# Patient Record
Sex: Female | Born: 1955
Health system: Southern US, Community
[De-identification: ages and names within clinical notes are randomized; demographics above are authoritative.]

## PROBLEM LIST (undated history)

## (undated) DIAGNOSIS — R600 Localized edema: Secondary | ICD-10-CM

## (undated) DIAGNOSIS — E7800 Pure hypercholesterolemia, unspecified: Secondary | ICD-10-CM

## (undated) DIAGNOSIS — E785 Hyperlipidemia, unspecified: Secondary | ICD-10-CM

## (undated) DIAGNOSIS — M1990 Unspecified osteoarthritis, unspecified site: Secondary | ICD-10-CM

## (undated) DIAGNOSIS — R7303 Prediabetes: Secondary | ICD-10-CM

## (undated) DIAGNOSIS — M5431 Sciatica, right side: Secondary | ICD-10-CM

## (undated) DIAGNOSIS — K5900 Constipation, unspecified: Secondary | ICD-10-CM

## (undated) DIAGNOSIS — G588 Other specified mononeuropathies: Secondary | ICD-10-CM

## (undated) DIAGNOSIS — G4733 Obstructive sleep apnea (adult) (pediatric): Secondary | ICD-10-CM

## (undated) DIAGNOSIS — J45909 Unspecified asthma, uncomplicated: Secondary | ICD-10-CM

## (undated) DIAGNOSIS — Z860101 Personal history of adenomatous and serrated colon polyps: Secondary | ICD-10-CM

## (undated) DIAGNOSIS — F32A Depression, unspecified: Secondary | ICD-10-CM

## (undated) DIAGNOSIS — I10 Essential (primary) hypertension: Secondary | ICD-10-CM

## (undated) DIAGNOSIS — N819 Female genital prolapse, unspecified: Secondary | ICD-10-CM

## (undated) DIAGNOSIS — N816 Rectocele: Secondary | ICD-10-CM

## (undated) DIAGNOSIS — F329 Major depressive disorder, single episode, unspecified: Secondary | ICD-10-CM

## (undated) DIAGNOSIS — K219 Gastro-esophageal reflux disease without esophagitis: Secondary | ICD-10-CM

## (undated) DIAGNOSIS — Z973 Presence of spectacles and contact lenses: Secondary | ICD-10-CM

## (undated) DIAGNOSIS — E559 Vitamin D deficiency, unspecified: Secondary | ICD-10-CM

## (undated) DIAGNOSIS — I4891 Unspecified atrial fibrillation: Secondary | ICD-10-CM

## (undated) DIAGNOSIS — R0609 Other forms of dyspnea: Secondary | ICD-10-CM

## (undated) DIAGNOSIS — R0602 Shortness of breath: Secondary | ICD-10-CM

## (undated) DIAGNOSIS — R002 Palpitations: Secondary | ICD-10-CM

## (undated) DIAGNOSIS — J4530 Mild persistent asthma, uncomplicated: Secondary | ICD-10-CM

## (undated) DIAGNOSIS — N8110 Cystocele, unspecified: Secondary | ICD-10-CM

## (undated) DIAGNOSIS — M549 Dorsalgia, unspecified: Secondary | ICD-10-CM

## (undated) DIAGNOSIS — I495 Sick sinus syndrome: Secondary | ICD-10-CM

## (undated) DIAGNOSIS — F319 Bipolar disorder, unspecified: Secondary | ICD-10-CM

## (undated) DIAGNOSIS — I4811 Longstanding persistent atrial fibrillation: Secondary | ICD-10-CM

## (undated) DIAGNOSIS — G4730 Sleep apnea, unspecified: Secondary | ICD-10-CM

## (undated) DIAGNOSIS — K5730 Diverticulosis of large intestine without perforation or abscess without bleeding: Secondary | ICD-10-CM

## (undated) DIAGNOSIS — Z86010 Personal history of colonic polyps: Secondary | ICD-10-CM

## (undated) DIAGNOSIS — E041 Nontoxic single thyroid nodule: Secondary | ICD-10-CM

## (undated) DIAGNOSIS — T884XXA Failed or difficult intubation, initial encounter: Secondary | ICD-10-CM

## (undated) DIAGNOSIS — Z7901 Long term (current) use of anticoagulants: Secondary | ICD-10-CM

## (undated) DIAGNOSIS — F3181 Bipolar II disorder: Secondary | ICD-10-CM

## (undated) DIAGNOSIS — M7989 Other specified soft tissue disorders: Secondary | ICD-10-CM

## (undated) DIAGNOSIS — R12 Heartburn: Secondary | ICD-10-CM

## (undated) DIAGNOSIS — I499 Cardiac arrhythmia, unspecified: Secondary | ICD-10-CM

## (undated) DIAGNOSIS — R112 Nausea with vomiting, unspecified: Secondary | ICD-10-CM

## (undated) DIAGNOSIS — Z8719 Personal history of other diseases of the digestive system: Secondary | ICD-10-CM

## (undated) DIAGNOSIS — Z9889 Other specified postprocedural states: Secondary | ICD-10-CM

## (undated) HISTORY — DX: Vitamin D deficiency, unspecified: E55.9

## (undated) HISTORY — DX: Unspecified atrial fibrillation: I48.91

## (undated) HISTORY — PX: COLONOSCOPY: SHX174

## (undated) HISTORY — PX: OTHER SURGICAL HISTORY: SHX169

## (undated) HISTORY — DX: Prediabetes: R73.03

## (undated) HISTORY — PX: TONSILLECTOMY: SUR1361

## (undated) HISTORY — DX: Dorsalgia, unspecified: M54.9

## (undated) HISTORY — DX: Depression, unspecified: F32.A

## (undated) HISTORY — DX: Heartburn: R12

## (undated) HISTORY — DX: Other specified mononeuropathies: G58.8

## (undated) HISTORY — DX: Other specified soft tissue disorders: M79.89

## (undated) HISTORY — DX: Palpitations: R00.2

## (undated) HISTORY — DX: Constipation, unspecified: K59.00

## (undated) HISTORY — DX: Shortness of breath: R06.02

## (undated) HISTORY — DX: Longstanding persistent atrial fibrillation: I48.11

## (undated) HISTORY — DX: Pure hypercholesterolemia, unspecified: E78.00

---
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
## 1977-06-13 HISTORY — PX: TONSILLECTOMY: SUR1361

## 2003-09-23 ENCOUNTER — Ambulatory Visit (HOSPITAL_COMMUNITY): Admission: RE | Admit: 2003-09-23 | Discharge: 2003-09-23 | Payer: Self-pay | Admitting: Family Medicine

## 2003-09-25 ENCOUNTER — Other Ambulatory Visit: Admission: RE | Admit: 2003-09-25 | Discharge: 2003-09-25 | Payer: Self-pay | Admitting: *Deleted

## 2003-10-15 ENCOUNTER — Ambulatory Visit (HOSPITAL_COMMUNITY): Admission: RE | Admit: 2003-10-15 | Discharge: 2003-10-15 | Payer: Self-pay | Admitting: Gastroenterology

## 2004-12-07 ENCOUNTER — Other Ambulatory Visit: Admission: RE | Admit: 2004-12-07 | Discharge: 2004-12-07 | Payer: Self-pay | Admitting: *Deleted

## 2004-12-21 ENCOUNTER — Ambulatory Visit (HOSPITAL_COMMUNITY): Admission: RE | Admit: 2004-12-21 | Discharge: 2004-12-21 | Payer: Self-pay | Admitting: Family Medicine

## 2005-07-08 ENCOUNTER — Encounter: Admission: RE | Admit: 2005-07-08 | Discharge: 2005-10-06 | Payer: Self-pay | Admitting: Nurse Practitioner

## 2005-11-15 ENCOUNTER — Ambulatory Visit (HOSPITAL_BASED_OUTPATIENT_CLINIC_OR_DEPARTMENT_OTHER): Admission: RE | Admit: 2005-11-15 | Discharge: 2005-11-15 | Payer: Self-pay | Admitting: Orthopaedic Surgery

## 2005-11-15 HISTORY — PX: SHOULDER ARTHROSCOPY: SHX128

## 2006-01-16 ENCOUNTER — Ambulatory Visit (HOSPITAL_COMMUNITY): Admission: RE | Admit: 2006-01-16 | Discharge: 2006-01-16 | Payer: Self-pay | Admitting: Family Medicine

## 2006-08-09 ENCOUNTER — Other Ambulatory Visit: Admission: RE | Admit: 2006-08-09 | Discharge: 2006-08-09 | Payer: Self-pay | Admitting: *Deleted

## 2006-08-25 ENCOUNTER — Ambulatory Visit: Payer: Self-pay | Admitting: Oncology

## 2006-08-31 LAB — COMPREHENSIVE METABOLIC PANEL
ALT: 20 U/L (ref 0–35)
AST: 16 U/L (ref 0–37)
Albumin: 4.1 g/dL (ref 3.5–5.2)
Alkaline Phosphatase: 58 U/L (ref 39–117)
BUN: 15 mg/dL (ref 6–23)
CO2: 24 mEq/L (ref 19–32)
Calcium: 9.3 mg/dL (ref 8.4–10.5)
Chloride: 102 mEq/L (ref 96–112)
Creatinine, Ser: 0.62 mg/dL (ref 0.40–1.20)
Glucose, Bld: 89 mg/dL (ref 70–99)
Potassium: 3.9 mEq/L (ref 3.5–5.3)
Sodium: 139 mEq/L (ref 135–145)
Total Bilirubin: 0.4 mg/dL (ref 0.3–1.2)
Total Protein: 6.9 g/dL (ref 6.0–8.3)

## 2006-08-31 LAB — LACTATE DEHYDROGENASE: LDH: 116 U/L (ref 94–250)

## 2006-08-31 LAB — CBC WITH DIFFERENTIAL/PLATELET
BASO%: 0.3 % (ref 0.0–2.0)
Basophils Absolute: 0 10*3/uL (ref 0.0–0.1)
EOS%: 3.5 % (ref 0.0–7.0)
Eosinophils Absolute: 0.5 10*3/uL (ref 0.0–0.5)
HCT: 38.8 % (ref 34.8–46.6)
HGB: 13.6 g/dL (ref 11.6–15.9)
LYMPH%: 17.7 % (ref 14.0–48.0)
MCH: 29.7 pg (ref 26.0–34.0)
MCHC: 35 g/dL (ref 32.0–36.0)
MCV: 85 fL (ref 81.0–101.0)
MONO#: 0.9 10*3/uL (ref 0.1–0.9)
MONO%: 6.5 % (ref 0.0–13.0)
NEUT#: 10.2 10*3/uL — ABNORMAL HIGH (ref 1.5–6.5)
NEUT%: 72 % (ref 39.6–76.8)
Platelets: 361 10*3/uL (ref 145–400)
RBC: 4.56 10*6/uL (ref 3.70–5.32)
RDW: 13.7 % (ref 11.3–14.5)
WBC: 14.1 10*3/uL — ABNORMAL HIGH (ref 3.9–10.0)
lymph#: 2.5 10*3/uL (ref 0.9–3.3)

## 2006-08-31 LAB — CHCC SMEAR

## 2007-02-05 ENCOUNTER — Ambulatory Visit (HOSPITAL_COMMUNITY): Admission: RE | Admit: 2007-02-05 | Discharge: 2007-02-05 | Payer: Self-pay | Admitting: Family Medicine

## 2007-02-09 ENCOUNTER — Ambulatory Visit: Payer: Self-pay | Admitting: Oncology

## 2007-02-15 LAB — CBC WITH DIFFERENTIAL/PLATELET
BASO%: 0.3 % (ref 0.0–2.0)
Basophils Absolute: 0 10*3/uL (ref 0.0–0.1)
EOS%: 3.4 % (ref 0.0–7.0)
Eosinophils Absolute: 0.5 10*3/uL (ref 0.0–0.5)
HCT: 40 % (ref 34.8–46.6)
HGB: 14 g/dL (ref 11.6–15.9)
LYMPH%: 21.2 % (ref 14.0–48.0)
MCH: 30.6 pg (ref 26.0–34.0)
MCHC: 35.1 g/dL (ref 32.0–36.0)
MCV: 87.2 fL (ref 81.0–101.0)
MONO#: 0.9 10*3/uL (ref 0.1–0.9)
MONO%: 6.5 % (ref 0.0–13.0)
NEUT#: 9 10*3/uL — ABNORMAL HIGH (ref 1.5–6.5)
NEUT%: 68.6 % (ref 39.6–76.8)
Platelets: 342 10*3/uL (ref 145–400)
RBC: 4.58 10*6/uL (ref 3.70–5.32)
RDW: 12.9 % (ref 11.3–14.5)
WBC: 13.2 10*3/uL — ABNORMAL HIGH (ref 3.9–10.0)
lymph#: 2.8 10*3/uL (ref 0.9–3.3)

## 2007-02-20 LAB — COMPREHENSIVE METABOLIC PANEL
ALT: 13 U/L (ref 0–35)
AST: 12 U/L (ref 0–37)
Albumin: 4.4 g/dL (ref 3.5–5.2)
Alkaline Phosphatase: 59 U/L (ref 39–117)
BUN: 16 mg/dL (ref 6–23)
CO2: 25 mEq/L (ref 19–32)
Calcium: 9.5 mg/dL (ref 8.4–10.5)
Chloride: 102 mEq/L (ref 96–112)
Creatinine, Ser: 0.72 mg/dL (ref 0.40–1.20)
Glucose, Bld: 123 mg/dL — ABNORMAL HIGH (ref 70–99)
Potassium: 4 mEq/L (ref 3.5–5.3)
Sodium: 138 mEq/L (ref 135–145)
Total Bilirubin: 0.4 mg/dL (ref 0.3–1.2)
Total Protein: 7.4 g/dL (ref 6.0–8.3)

## 2007-02-20 LAB — JAK2 GENOTYPR

## 2007-08-14 ENCOUNTER — Ambulatory Visit: Payer: Self-pay | Admitting: Oncology

## 2008-02-19 ENCOUNTER — Ambulatory Visit (HOSPITAL_COMMUNITY): Admission: RE | Admit: 2008-02-19 | Discharge: 2008-02-19 | Payer: Self-pay | Admitting: Family Medicine

## 2008-04-08 ENCOUNTER — Other Ambulatory Visit: Admission: RE | Admit: 2008-04-08 | Discharge: 2008-04-08 | Payer: Self-pay | Admitting: Family Medicine

## 2008-04-14 ENCOUNTER — Encounter: Admission: RE | Admit: 2008-04-14 | Discharge: 2008-04-14 | Payer: Self-pay | Admitting: Family Medicine

## 2008-06-13 HISTORY — PX: SHOULDER SURGERY: SHX246

## 2008-10-14 ENCOUNTER — Encounter: Admission: RE | Admit: 2008-10-14 | Discharge: 2008-10-14 | Payer: Self-pay | Admitting: Family Medicine

## 2009-06-13 HISTORY — PX: TOE SURGERY: SHX1073

## 2010-07-02 ENCOUNTER — Ambulatory Visit (HOSPITAL_COMMUNITY)
Admission: RE | Admit: 2010-07-02 | Discharge: 2010-07-02 | Payer: Self-pay | Source: Home / Self Care | Attending: Family Medicine | Admitting: Family Medicine

## 2010-10-29 NOTE — Op Note (Signed)
Mary Mcfarland, Mary Mcfarland         ACCOUNT NO.:  [id number]   MEDICAL RECORD NO.:  [id number]          PATIENT TYPE:  AMB   LOCATION:  DSC                          FACILITY:  [facility]   PHYSICIAN:  Lubertha Basque. Dalldorf, M.D.DATE OF BIRTH:  [birth year]   DATE OF PROCEDURE:  11/15/2005  DATE OF DISCHARGE:                                 OPERATIVE REPORT   PREOPERATIVE DIAGNOSIS:  1.  Right shoulder impingement.  2.  Right shoulder partial rotator cuff tear.   POSTOPERATIVE DIAGNOSIS:  1.  Right shoulder impingement.  2.  Right shoulder partial rotator cuff tear.   PROCEDURE:  1.  Right shoulder arthroscopic acromioplasty.  2.  Right shoulder arthroscopic debridement.   ANESTHESIA:  General and block.   ATTENDING SURGEON:  Lubertha Basque. Jerl Santos, M.D.   ASSISTANT:  Lindwood Qua, P.A.-C.   INDICATIONS FOR PROCEDURE:  The patient is a 55 year old woman with a long  history of right shoulder pain.  This has persisted despite oral anti-  inflammatories and an exercise program.  She underwent a subacromial  injection which did help for a short period of time.  She has pain which  limits her ability to rest and use her arm.  She is offered an arthroscopy.  Informed operative consent was obtained after a discussion about the  possible complications of reaction to anesthesia and infection.  We did not  obtain a preoperative MRI scan as that was going to be a bit of a financial  hardship to her.  It also did not seem imperative do so.   SUMMARY:  Under general anesthesia and a block, through two portals, an  arthroscopy of the right shoulder was performed.  She did have things  consistent with an impingement syndrome and a partial thickness bursal side  rotator cuff tear.  A debridement was done along with a thorough  acromioplasty.  There were no degenerative changes and the labral structures  and the biceps tendon appeared normal throughout.  Bryna Colander assisted  throughout with  positioning and passing of instruments and direction of the  arthroscope and closure of portals.   DESCRIPTION OF PROCEDURE:  The patient was taken to the operating suite  where general anesthetic was applied without difficulty.  She was positioned  in a beach-chair position and prepped and draped in a normal sterile  fashion.  After the administration of IV antibiotic which was Kefzol, an  arthroscopy of the right shoulder was performed through a total of two  portals.  The glenohumeral joint showed no degenerative changes and the  biceps tendon and rotator cuff appeared benign from below.  In the  subacromial space, she had things consistent with a bursal side partial  thickness rotator cuff tear.  This was debrided back to stable tissues and  consisted of no more than 10% thickness of the rotator cuff.  She had a  prominent subacromial morphology addressed with an acromioplasty back to a  flat surface.  This was done with the bur in the lateral position followed  by transfer of the bur to the posterior position.  The [facility] joint did not  appear  to be impinging on the rotator cuff and was not addressed.  The  shoulder was thoroughly irrigated followed by reapproximation of the portals  loosely with nylon.  Adaptic was applied followed by dry gauze and tape.  Estimated blood loss and interoperative fluids can be obtained from  anesthesia records.   DISPOSITION:  The patient was extubated in the operating room and taken to  the recovery room in stable addition.  The plans were for her to go home on  the same day and follow up in the office in a week.  I will contact her by  phone tonight.      Lubertha Basque Jerl Santos, M.D.  Electronically Signed     PGD/MEDQ  D:  11/15/2005  T:  11/15/2005  Job:  [id number]

## 2011-12-07 ENCOUNTER — Other Ambulatory Visit (HOSPITAL_BASED_OUTPATIENT_CLINIC_OR_DEPARTMENT_OTHER): Payer: Self-pay | Admitting: Family Medicine

## 2011-12-07 DIAGNOSIS — E041 Nontoxic single thyroid nodule: Secondary | ICD-10-CM

## 2011-12-09 ENCOUNTER — Ambulatory Visit (HOSPITAL_BASED_OUTPATIENT_CLINIC_OR_DEPARTMENT_OTHER)
Admission: RE | Admit: 2011-12-09 | Discharge: 2011-12-09 | Disposition: A | Discharge status: Home / Self Care | Payer: BC Managed Care – PPO | Source: Ambulatory Visit | Attending: Family Medicine | Admitting: Family Medicine

## 2011-12-09 ENCOUNTER — Other Ambulatory Visit (HOSPITAL_BASED_OUTPATIENT_CLINIC_OR_DEPARTMENT_OTHER): Payer: Self-pay

## 2011-12-09 DIAGNOSIS — E041 Nontoxic single thyroid nodule: Secondary | ICD-10-CM | POA: Insufficient documentation

## 2012-06-08 ENCOUNTER — Other Ambulatory Visit (HOSPITAL_COMMUNITY): Payer: Self-pay | Admitting: Family

## 2012-06-08 DIAGNOSIS — Z1231 Encounter for screening mammogram for malignant neoplasm of breast: Secondary | ICD-10-CM

## 2012-08-10 ENCOUNTER — Other Ambulatory Visit (HOSPITAL_COMMUNITY): Payer: Self-pay | Admitting: Family

## 2012-08-10 ENCOUNTER — Other Ambulatory Visit (HOSPITAL_COMMUNITY): Payer: Self-pay | Admitting: Family Medicine

## 2012-08-10 DIAGNOSIS — Z1231 Encounter for screening mammogram for malignant neoplasm of breast: Secondary | ICD-10-CM

## 2012-08-21 ENCOUNTER — Ambulatory Visit (HOSPITAL_COMMUNITY)
Admission: RE | Admit: 2012-08-21 | Discharge: 2012-08-21 | Disposition: A | Discharge status: Home / Self Care | Payer: BC Managed Care – PPO | Source: Ambulatory Visit | Attending: Family Medicine | Admitting: Family Medicine

## 2012-08-21 DIAGNOSIS — Z1231 Encounter for screening mammogram for malignant neoplasm of breast: Secondary | ICD-10-CM

## 2013-08-06 ENCOUNTER — Other Ambulatory Visit: Payer: Self-pay | Admitting: Orthopaedic Surgery

## 2013-08-06 DIAGNOSIS — M25519 Pain in unspecified shoulder: Secondary | ICD-10-CM

## 2013-08-17 ENCOUNTER — Ambulatory Visit
Admission: RE | Admit: 2013-08-17 | Discharge: 2013-08-17 | Disposition: A | Discharge status: Home / Self Care | Payer: BC Managed Care – PPO | Source: Ambulatory Visit | Attending: Orthopaedic Surgery | Admitting: Orthopaedic Surgery

## 2013-08-17 DIAGNOSIS — M25519 Pain in unspecified shoulder: Secondary | ICD-10-CM

## 2014-03-26 ENCOUNTER — Emergency Department (HOSPITAL_COMMUNITY)
Admission: EM | Admit: 2014-03-26 | Discharge: 2014-03-26 | Disposition: A | Discharge status: Home / Self Care | Payer: PRIVATE HEALTH INSURANCE | Attending: Emergency Medicine | Admitting: Emergency Medicine

## 2014-03-26 ENCOUNTER — Encounter (HOSPITAL_COMMUNITY): Payer: Self-pay | Admitting: Emergency Medicine

## 2014-03-26 DIAGNOSIS — N3091 Cystitis, unspecified with hematuria: Secondary | ICD-10-CM

## 2014-03-26 DIAGNOSIS — R300 Dysuria: Secondary | ICD-10-CM | POA: Diagnosis not present

## 2014-03-26 DIAGNOSIS — N3090 Cystitis, unspecified without hematuria: Secondary | ICD-10-CM | POA: Insufficient documentation

## 2014-03-26 DIAGNOSIS — R319 Hematuria, unspecified: Secondary | ICD-10-CM | POA: Insufficient documentation

## 2014-03-26 DIAGNOSIS — Z79899 Other long term (current) drug therapy: Secondary | ICD-10-CM | POA: Diagnosis not present

## 2014-03-26 DIAGNOSIS — Z87891 Personal history of nicotine dependence: Secondary | ICD-10-CM | POA: Insufficient documentation

## 2014-03-26 DIAGNOSIS — I10 Essential (primary) hypertension: Secondary | ICD-10-CM | POA: Insufficient documentation

## 2014-03-26 DIAGNOSIS — R339 Retention of urine, unspecified: Secondary | ICD-10-CM | POA: Diagnosis present

## 2014-03-26 HISTORY — DX: Essential (primary) hypertension: I10

## 2014-03-26 LAB — URINALYSIS, ROUTINE W REFLEX MICROSCOPIC
Glucose, UA: 250 mg/dL — AB
Ketones, ur: 40 mg/dL — AB
Nitrite: POSITIVE — AB
Protein, ur: >300 mg/dL — AB
Specific Gravity, Urine: 1.03 (ref 1.005–1.030)
Urobilinogen, UA: 4 mg/dL — ABNORMAL HIGH (ref 0.0–1.0)
pH: 5 (ref 5.0–8.0)

## 2014-03-26 LAB — BASIC METABOLIC PANEL
Anion gap: 15 (ref 5–15)
BUN: 21 mg/dL (ref 6–23)
CO2: 26 mEq/L (ref 19–32)
Calcium: 9.8 mg/dL (ref 8.4–10.5)
Chloride: 97 mEq/L (ref 96–112)
Creatinine, Ser: 0.71 mg/dL (ref 0.50–1.10)
GFR calc Af Amer: >90 mL/min (ref >90)
GFR calc non Af Amer: >90 mL/min (ref >90)
Glucose, Bld: 103 mg/dL — ABNORMAL HIGH (ref 70–99)
Potassium: 3.3 mEq/L — ABNORMAL LOW (ref 3.7–5.3)
Sodium: 138 mEq/L (ref 137–147)

## 2014-03-26 LAB — CBC WITH DIFFERENTIAL/PLATELET
Basophils Absolute: 0 10*3/uL (ref 0.0–0.1)
Basophils Relative: 0 % (ref 0–1)
Eosinophils Absolute: 0.2 10*3/uL (ref 0.0–0.7)
Eosinophils Relative: 1 % (ref 0–5)
HCT: 41.6 % (ref 36.0–46.0)
Hemoglobin: 13.8 g/dL (ref 12.0–15.0)
Lymphocytes Relative: 10 % — ABNORMAL LOW (ref 12–46)
Lymphs Abs: 2 10*3/uL (ref 0.7–4.0)
MCH: 29 pg (ref 26.0–34.0)
MCHC: 33.2 g/dL (ref 30.0–36.0)
MCV: 87.4 fL (ref 78.0–100.0)
Monocytes Absolute: 1.3 10*3/uL — ABNORMAL HIGH (ref 0.1–1.0)
Monocytes Relative: 7 % (ref 3–12)
Neutro Abs: 16.7 10*3/uL — ABNORMAL HIGH (ref 1.7–7.7)
Neutrophils Relative %: 82 % — ABNORMAL HIGH (ref 43–77)
Platelets: 314 10*3/uL (ref 150–400)
RBC: 4.76 MIL/uL (ref 3.87–5.11)
RDW: 13 % (ref 11.5–15.5)
WBC: 20.2 10*3/uL — ABNORMAL HIGH (ref 4.0–10.5)

## 2014-03-26 LAB — URINE MICROSCOPIC-ADD ON

## 2014-03-26 MED ORDER — PHENAZOPYRIDINE HCL 200 MG PO TABS: 200.0000 mg | ORAL_TABLET | Freq: Three times a day (TID) | ORAL | Status: DC | PRN

## 2014-03-26 MED ORDER — HYDROCODONE-ACETAMINOPHEN 5-325 MG PO TABS
1.0000 | ORAL_TABLET | ORAL | Status: AC
  Administered 2014-03-26: 1 via ORAL
  Filled 2014-03-26: qty 1

## 2014-03-26 MED ORDER — PHENAZOPYRIDINE HCL 200 MG PO TABS
200.0000 mg | ORAL_TABLET | Freq: Three times a day (TID) | ORAL | Status: DC
  Administered 2014-03-26: 200 mg via ORAL
  Filled 2014-03-26: qty 1

## 2014-03-26 MED ORDER — HYDROCODONE-ACETAMINOPHEN 5-325 MG PO TABS: 1.0000 | ORAL_TABLET | ORAL | Status: DC | PRN

## 2014-03-26 MED ORDER — CIPROFLOXACIN HCL 500 MG PO TABS: 500.0000 mg | ORAL_TABLET | Freq: Two times a day (BID) | ORAL | Status: DC

## 2014-03-26 MED ORDER — CIPROFLOXACIN HCL 500 MG PO TABS
500.0000 mg | ORAL_TABLET | Freq: Once | ORAL | Status: AC
  Administered 2014-03-26: 500 mg via ORAL
  Filled 2014-03-26: qty 1

## 2014-03-26 NOTE — ED Provider Notes (Signed)
CSN: [id number]     Arrival date & time 03/26/14  1902 History   First MD Initiated Contact with Patient 03/26/14 1915     Chief Complaint  Patient presents with  . Hematuria  . Urinary Retention   Patient is a 58 y.o. female presenting with hematuria. The history is provided by the patient.  Hematuria This is a new problem. The current episode started yesterday. The problem occurs constantly.  Pt noticed an urge to urinate yesterday.  Initially the symptoms were mild but then it increased.  She is constantly having to urinate small amounts that look bloody.  No back pain.  No fevers although she has felt chilled.   NO vomiting.  Some loose stools.    Past Medical History  Diagnosis Date  . Hypertension    Past Surgical History  Procedure Laterality Date  . Tonsillectomy    . Bone spur surgery     No family history on file. History  Substance Use Topics  . Smoking status: Former Smoker -- 1.00 packs/day    Types: Cigarettes  . Smokeless tobacco: Not on file  . Alcohol Use: Yes     Comment: rarely   OB History   Grav Para Term Preterm Abortions TAB SAB Ect Mult Living                 Review of Systems  Constitutional: Negative for fever.  Genitourinary: Positive for dysuria and hematuria. Negative for flank pain.  All other systems reviewed and are negative.     Allergies  Niaspan  Home Medications   Prior to Admission medications   Medication Sig Start Date End Date Taking? Authorizing Provider  atorvastatin (LIPITOR) 40 MG tablet Take 40 mg by mouth daily.   Yes Historical Provider, MD  buPROPion (WELLBUTRIN XL) 300 MG 24 hr tablet Take 300 mg by mouth daily.   Yes Historical Provider, MD  divalproex (DEPAKOTE ER) 500 MG 24 hr tablet Take 1,000 mg by mouth at bedtime.   Yes Historical Provider, MD  lisinopril (PRINIVIL,ZESTRIL) 20 MG tablet Take 20 mg by mouth daily.   Yes Historical Provider, MD  ciprofloxacin (CIPRO) 500 MG tablet Take 1 tablet (500 mg total)  by mouth 2 (two) times daily. 03/26/14   Dorie Rank, MD  HYDROcodone-acetaminophen (NORCO/VICODIN) 5-325 MG per tablet Take 1-2 tablets by mouth every 4 (four) hours as needed. 03/26/14   Dorie Rank, MD  phenazopyridine (PYRIDIUM) 200 MG tablet Take 1 tablet (200 mg total) by mouth 3 (three) times daily as needed for pain. 03/26/14   Dorie Rank, MD   BP 131/64  Pulse 65  Temp(Src) 97.9 F (36.6 C) (Oral)  Resp 16  Ht 5\' 6"  (1.676 m)  Wt 248 lb (112.492 kg)  BMI 40.05 kg/m2  SpO2 98%  LMP 09/24/2013 Physical Exam  Nursing note and vitals reviewed. Constitutional: She appears well-developed and well-nourished. No distress.  HENT:  Head: Normocephalic and atraumatic.  Right Ear: External ear normal.  Left Ear: External ear normal.  Eyes: Conjunctivae are normal. Right eye exhibits no discharge. Left eye exhibits no discharge. No scleral icterus.  Neck: Neck supple. No tracheal deviation present.  Cardiovascular: Normal rate, regular rhythm and intact distal pulses.   Pulmonary/Chest: Effort normal and breath sounds normal. No stridor. No respiratory distress. She has no wheezes. She has no rales.  Abdominal: Soft. Bowel sounds are normal. She exhibits no distension. There is no tenderness. There is no rebound and no guarding.  Musculoskeletal: She exhibits no edema and no tenderness.  Neurological: She is alert. She has normal strength. No cranial nerve deficit (no facial droop, extraocular movements intact, no slurred speech) or sensory deficit. She exhibits normal muscle tone. She displays no seizure activity. Coordination normal.  Skin: Skin is warm and dry. No rash noted.  Psychiatric: She has a normal mood and affect.    ED Course  Procedures (including critical care time) Labs Review Labs Reviewed  CBC WITH DIFFERENTIAL - Abnormal; Notable for the following:    WBC 20.2 (*)    Neutrophils Relative % 82 (*)    Neutro Abs 16.7 (*)    Lymphocytes Relative 10 (*)    Monocytes  Absolute 1.3 (*)    All other components within normal limits  BASIC METABOLIC PANEL - Abnormal; Notable for the following:    Potassium 3.3 (*)    Glucose, Bld 103 (*)    All other components within normal limits  URINALYSIS, ROUTINE W REFLEX MICROSCOPIC - Abnormal; Notable for the following:    Color, Urine RED (*)    APPearance TURBID (*)    Glucose, UA 250 (*)    Hgb urine dipstick LARGE (*)    Bilirubin Urine LARGE (*)    Ketones, ur 40 (*)    Protein, ur >300 (*)    Urobilinogen, UA 4.0 (*)    Nitrite POSITIVE (*)    Leukocytes, UA LARGE (*)    All other components within normal limits  URINE MICROSCOPIC-ADD ON - Abnormal; Notable for the following:    Bacteria, UA FEW (*)    Crystals CA OXALATE CRYSTALS (*)    All other components within normal limits  URINE CULTURE     MDM   Final diagnoses:  Hemorrhagic cystitis    Patient's symptoms are consistent with a hemorrhagic cystitis. She had a bladder scan while she was in the emergency room. There is no evidence of urinary retention. I think her symptoms are related to her urgency and frequency.  Patient is tolerating oral fluids. She is in no distress. Plan is to discharge home with oral antibiotics and followup with her primary care doctor.   Dorie Rank, MD 03/26/14 2147

## 2014-03-26 NOTE — Discharge Instructions (Signed)

## 2014-03-26 NOTE — ED Notes (Signed)
Pt has a urine specimen at the bedside.

## 2014-03-26 NOTE — ED Notes (Addendum)
MD Knapp at bedside 

## 2014-03-26 NOTE — ED Notes (Signed)
Pt reports that she started having hematuria and dysuria a couple of days ago. Gross hematuria noted in sample provided.

## 2014-03-28 LAB — URINE CULTURE: Colony Count: >=100000

## 2014-03-31 ENCOUNTER — Telehealth (HOSPITAL_BASED_OUTPATIENT_CLINIC_OR_DEPARTMENT_OTHER): Payer: Self-pay

## 2014-03-31 NOTE — Telephone Encounter (Signed)
Post ED Visit - Positive Culture Follow-up  Culture report reviewed by antimicrobial stewardship pharmacist: []  [address], Pharm.D., BCPS [x]  Heide Guile, Pharm.D., BCPS []  Alycia Rossetti, Pharm.D., BCPS []  [place], [place].D., BCPS, AAHIVP []  Legrand Como, Pharm.D., BCPS, AAHIVP []  Carly Sabat, Pharm.D. []  Elenor Quinones, Pharm.D.  Positive Urine culture Treated with Ciprofloxacin, organism sensitive to the same and no further patient follow-up is required at this time.  [name] 03/31/2014, 4:07 AM

## 2015-06-14 DIAGNOSIS — I4811 Longstanding persistent atrial fibrillation: Secondary | ICD-10-CM

## 2015-06-14 HISTORY — PX: FOOT SURGERY: SHX648

## 2015-06-14 HISTORY — DX: Longstanding persistent atrial fibrillation: I48.11

## 2015-06-18 ENCOUNTER — Other Ambulatory Visit: Payer: Self-pay | Admitting: Cardiology

## 2015-06-18 ENCOUNTER — Ambulatory Visit
Admission: RE | Admit: 2015-06-18 | Discharge: 2015-06-18 | Disposition: A | Discharge status: Home / Self Care | Payer: BLUE CROSS/BLUE SHIELD | Source: Ambulatory Visit | Attending: Cardiology | Admitting: Cardiology

## 2015-06-18 DIAGNOSIS — I480 Paroxysmal atrial fibrillation: Secondary | ICD-10-CM

## 2015-09-23 ENCOUNTER — Ambulatory Visit (HOSPITAL_BASED_OUTPATIENT_CLINIC_OR_DEPARTMENT_OTHER): Payer: BLUE CROSS/BLUE SHIELD

## 2016-03-02 ENCOUNTER — Ambulatory Visit (HOSPITAL_BASED_OUTPATIENT_CLINIC_OR_DEPARTMENT_OTHER): Payer: BLUE CROSS/BLUE SHIELD | Attending: Cardiology | Admitting: Internal Medicine

## 2016-03-02 VITALS — Ht 66.0 in | Wt 250.0 lb

## 2016-03-02 DIAGNOSIS — R0683 Snoring: Secondary | ICD-10-CM | POA: Insufficient documentation

## 2016-03-02 DIAGNOSIS — I491 Atrial premature depolarization: Secondary | ICD-10-CM | POA: Diagnosis not present

## 2016-03-02 DIAGNOSIS — G4733 Obstructive sleep apnea (adult) (pediatric): Secondary | ICD-10-CM | POA: Diagnosis not present

## 2016-03-02 DIAGNOSIS — G4700 Insomnia, unspecified: Secondary | ICD-10-CM | POA: Diagnosis not present

## 2016-03-02 DIAGNOSIS — G4736 Sleep related hypoventilation in conditions classified elsewhere: Secondary | ICD-10-CM | POA: Diagnosis not present

## 2016-03-02 DIAGNOSIS — I480 Paroxysmal atrial fibrillation: Secondary | ICD-10-CM

## 2016-03-03 ENCOUNTER — Other Ambulatory Visit (HOSPITAL_BASED_OUTPATIENT_CLINIC_OR_DEPARTMENT_OTHER): Payer: Self-pay

## 2016-03-03 DIAGNOSIS — I480 Paroxysmal atrial fibrillation: Secondary | ICD-10-CM

## 2016-03-05 DIAGNOSIS — I480 Paroxysmal atrial fibrillation: Secondary | ICD-10-CM | POA: Diagnosis not present

## 2016-03-05 NOTE — Procedures (Signed)
  Patient Name: Mary Mcfarland, Mary Mcfarland Date: 03/02/2016 Gender: Female D.O.B: Apr 03, 1956 Age (years): 60 Referring Provider: Landry Corporal Height (inches): 51 Interpreting Physician: Baird Lyons MD, ABSM Weight (lbs): 250 RPSGT: Gerhard Perches BMI: 40 MRN: [id number] Neck Size: 16.25 CLINICAL INFORMATION Sleep Study Type: NPSG Indication for sleep study: Snoring Epworth Sleepiness Score: 2  SLEEP STUDY TECHNIQUE As per the AASM Manual for the Scoring of Sleep and Associated Events v2.3 (April 2016) with a hypopnea requiring 4% desaturations. The channels recorded and monitored were frontal, central and occipital EEG, electrooculogram (EOG), submentalis EMG (chin), nasal and oral airflow, thoracic and abdominal wall motion, anterior tibialis EMG, snore microphone, electrocardiogram, and pulse oximetry.  MEDICATIONS Patient's medications include: charted for review. Medications self-administered by patient during sleep study : No sleep medicine administered.  SLEEP ARCHITECTURE The study was initiated at 11:00:51 PM and ended at 5:30:31 AM. Sleep onset time was 29.0 minutes and the sleep efficiency was 69.6%. The total sleep time was 271.2 minutes. Stage REM latency was 216.0 minutes. The patient spent 8.19% of the night in stage N1 sleep, 72.09% in stage N2 sleep, 0.00% in stage N3 and 19.73% in REM. Alpha intrusion was absent. Supine sleep was 30.83%. Wake after sleep onset 89 minutes  RESPIRATORY PARAMETERS The overall apnea/hypopnea index (AHI) was 20.4 per hour. There were 44 total apneas, including 39 obstructive, 5 central and 0 mixed apneas. There were 48 hypopneas and 40 RERAs. The AHI during Stage REM sleep was 59.4 per hour. AHI while supine was 34.4 per hour. The mean oxygen saturation was 93.37%. The minimum SpO2 during sleep was 62.00%. Moderate snoring was noted during this study.  CARDIAC DATA The 2 lead EKG demonstrated sinus rhythm. The mean  heart rate was 39.06 beats per minute. Other EKG findings include: Frequent PACs.  LEG MOVEMENT DATA The total PLMS were 0 with a resulting PLMS index of 0.00. Associated arousal with leg movement index was 0.0 .  IMPRESSIONS - Moderate obstructive sleep apnea occurred during this study (AHI = 20.4/h). - No significant central sleep apnea occurred during this study (CAI = 1.1/h). - Difficulty initiating and maintaining sleep, with insufficient sleep in first hours to meet protocol requirements for split CPAP titration. - Severe oxygen desaturation was noted during this study (Min O2 = 62.00%, Mean saturation 93%). - The patient snored with Moderate snoring volume. - Frequent PACs. - Clinically significant periodic limb movements did not occur during sleep. No significant associated arousals.  DIAGNOSIS - Obstructive Sleep Apnea (327.23 [G47.33 ICD-10]) - Nocturnal Hypoxemia (327.26 [G47.36 ICD-10]) - Difficulty initiating and maintaining sleep- insomnia  RECOMMENDATIONS - Therapeutic CPAP titration to determine optimal pressure required to alleviate sleep disordered breathing. - Positional therapy avoiding supine position during sleep. - Avoid alcohol, sedatives and other CNS depressants that may worsen sleep apnea and disrupt normal sleep architecture. - Sleep hygiene should be reviewed to assess factors that may improve sleep quality. - Weight management and regular exercise should be initiated or continued if appropriate.  [Electronically signed] 03/05/2016 03:31 PM  Baird Lyons MD, [place], American Board of Sleep Medicine   NPI: [id number]  [place], American Board of Sleep Medicine  ELECTRONICALLY SIGNED ON:  03/05/2016, 3:24 PM [place] PH: [phone]   FX: [phone] [place]

## 2016-03-13 DIAGNOSIS — T884XXA Failed or difficult intubation, initial encounter: Secondary | ICD-10-CM

## 2016-03-13 HISTORY — DX: Failed or difficult intubation, initial encounter: T88.4XXA

## 2016-03-14 ENCOUNTER — Other Ambulatory Visit: Payer: Self-pay | Admitting: Orthopaedic Surgery

## 2016-03-14 ENCOUNTER — Encounter (HOSPITAL_COMMUNITY): Payer: Self-pay | Admitting: *Deleted

## 2016-03-14 NOTE — H&P (Signed)
Mary Mcfarland is an 59 y.o. female.   Chief Complaint: left heel pain HPI: Mary Mcfarland is here with a new problem. I haven't seen her in a few years. She is here about her left heel. She says for 4 or 5 months she's had terrible heel pain despite shoe wear modification and some pads. At this point she can only wear sandals. She has terrible pain on the posterior aspect of this heel.   Radiographs:  X-rays that were ordered, performed, and interpreted by me today included 2 views of the left heel. These show a prominent pump bump along with some Achilles insertional spurs.  Past Medical History:  Diagnosis Date  . Asthma   . Bipolar disorder ([place])   . Difficult intubation    Narrow airway  . Dysrhythmia    A-fib  . Hypertension   . Sleep apnea     Past Surgical History:  Procedure Laterality Date  . bone spur surgery    . COLONOSCOPY    . TONSILLECTOMY      Family History  Problem Relation Age of Onset  . Heart disease Mother   . CVA Mother   . Hypertension Mother   . Cancer Mother   . Heart disease Father   . Hypertension Father   . Heart attack Father    Social History:  reports that she quit smoking about 20 years ago. Her smoking use included Cigarettes. She smoked 1.00 pack per day. She has never used smokeless tobacco. She reports that she drinks alcohol. Her drug history is not on file.  Allergies:  Allergies  Allergen Reactions  . Niacin Hives    No prescriptions prior to admission.    No results found for this or any previous visit (from the past 48 hour(s)). No results found.  Review of Systems  Musculoskeletal: Positive for joint pain.       Left heel  All other systems reviewed and are negative.   Last menstrual period 09/24/2013. Physical Exam  Constitutional: She is oriented to person, place, and time. She appears well-developed and well-nourished.  HENT:  Head: Normocephalic and atraumatic.  Eyes: Pupils are equal, round, and reactive to  light.  Neck: Normal range of motion.  Cardiovascular: Normal rate and regular rhythm.   Respiratory: Effort normal.  GI: Soft.  Musculoskeletal:  Left heel has a prominence at the Achilles attachment. This is exquisitely tender to palpation. There is some mild redness. Her ankle motion is full. Sensation and motor function are intact distally with palpable pulses on both feet. She also has some retrocalcaneal pain to palpation at this left ankle. Proximally Achilles tendon is nontender at the musculotendinous junction.   Neurological: She is alert and oriented to person, place, and time.  Skin: Skin is warm and dry.  Psychiatric: She has a normal mood and affect. Her behavior is normal. Judgment and thought content normal.     Assessment/Plan Assessment: Left ankle pump bump and Achilles insertional pain  Plan: I think we can help Mary Mcfarland with a heel procedure. She has disabling pain and she cannot wear regular shoes. I think if we can perform the pump bump excision along with excision of the insertional Achilles spurs and reattachment of the tendon she will eventually do well. I reviewed risk of anesthesia, infection, and wound healing problems. I told her she'll have to be nonweightbearing on this side for about a month. It sounds as though this is something she might want to set up  in the near future. I'm not really sure why she is on Eliquis as she's seen a cardiologist and worn a Holter monitor and it never registered any arrhythmia. Nevertheless she's been told that she can come off the medicine for procedures and we'll have her do that for about 5 or 6 days prior to the operation.  Palmer Fahrner, Larwance Sachs, PA-C 03/14/2016, 6:17 PM

## 2016-03-14 NOTE — Progress Notes (Signed)
Pt has history of A-fib, sees Dr. Wynonia Lawman, last OV was in February. Denies chest pain or sob. Just recently had sleep study done and she does have sleep apnea, but has not started Cpap. Pt states she was to have this surgery at [place] on 03/10/16 but was unable to be intubated due to a "narrow airway". States it's the first time she has been told this. Have requested Anesthesia notes from [place].

## 2016-03-15 ENCOUNTER — Ambulatory Visit (HOSPITAL_COMMUNITY): Payer: BLUE CROSS/BLUE SHIELD | Admitting: Anesthesiology

## 2016-03-15 ENCOUNTER — Observation Stay (HOSPITAL_COMMUNITY)
Admission: RE | Admit: 2016-03-15 | Discharge: 2016-03-16 | Disposition: A | Discharge status: Home / Self Care | Payer: BLUE CROSS/BLUE SHIELD | Source: Ambulatory Visit | Attending: Orthopaedic Surgery | Admitting: Orthopaedic Surgery

## 2016-03-15 ENCOUNTER — Encounter (HOSPITAL_COMMUNITY)
Admission: RE | Disposition: A | Discharge status: Home / Self Care | Payer: Self-pay | Source: Ambulatory Visit | Attending: Orthopaedic Surgery

## 2016-03-15 ENCOUNTER — Encounter (HOSPITAL_COMMUNITY): Payer: Self-pay | Admitting: *Deleted

## 2016-03-15 DIAGNOSIS — I10 Essential (primary) hypertension: Secondary | ICD-10-CM | POA: Diagnosis not present

## 2016-03-15 DIAGNOSIS — Z7901 Long term (current) use of anticoagulants: Secondary | ICD-10-CM | POA: Diagnosis not present

## 2016-03-15 DIAGNOSIS — M7752 Other enthesopathy of left foot: Principal | ICD-10-CM | POA: Insufficient documentation

## 2016-03-15 DIAGNOSIS — I4891 Unspecified atrial fibrillation: Secondary | ICD-10-CM | POA: Diagnosis not present

## 2016-03-15 DIAGNOSIS — Z87891 Personal history of nicotine dependence: Secondary | ICD-10-CM | POA: Insufficient documentation

## 2016-03-15 DIAGNOSIS — F319 Bipolar disorder, unspecified: Secondary | ICD-10-CM | POA: Diagnosis not present

## 2016-03-15 DIAGNOSIS — Z6841 Body Mass Index (BMI) 40.0 and over, adult: Secondary | ICD-10-CM | POA: Diagnosis not present

## 2016-03-15 DIAGNOSIS — J45909 Unspecified asthma, uncomplicated: Secondary | ICD-10-CM | POA: Diagnosis not present

## 2016-03-15 DIAGNOSIS — M7731 Calcaneal spur, right foot: Secondary | ICD-10-CM | POA: Diagnosis present

## 2016-03-15 DIAGNOSIS — M9261 Juvenile osteochondrosis of tarsus, right ankle: Secondary | ICD-10-CM | POA: Diagnosis present

## 2016-03-15 DIAGNOSIS — G4730 Sleep apnea, unspecified: Secondary | ICD-10-CM | POA: Insufficient documentation

## 2016-03-15 HISTORY — DX: Failed or difficult intubation, initial encounter: T88.4XXA

## 2016-03-15 HISTORY — DX: Sleep apnea, unspecified: G47.30

## 2016-03-15 HISTORY — PX: HEEL SPUR RESECTION: SHX6410

## 2016-03-15 HISTORY — DX: Bipolar disorder, unspecified: F31.9

## 2016-03-15 HISTORY — DX: Unspecified asthma, uncomplicated: J45.909

## 2016-03-15 HISTORY — DX: Cardiac arrhythmia, unspecified: I49.9

## 2016-03-15 LAB — BASIC METABOLIC PANEL
Anion gap: 9 (ref 5–15)
BUN: 17 mg/dL (ref 6–20)
CO2: 28 mmol/L (ref 22–32)
Calcium: 9.2 mg/dL (ref 8.9–10.3)
Chloride: 104 mmol/L (ref 101–111)
Creatinine, Ser: 0.62 mg/dL (ref 0.44–1.00)
GFR calc Af Amer: >60 mL/min (ref >60)
GFR calc non Af Amer: >60 mL/min (ref >60)
Glucose, Bld: 103 mg/dL — ABNORMAL HIGH (ref 65–99)
Potassium: 4 mmol/L (ref 3.5–5.1)
Sodium: 141 mmol/L (ref 135–145)

## 2016-03-15 LAB — PROTIME-INR
INR: 0.97
Prothrombin Time: 12.9 seconds (ref 11.4–15.2)

## 2016-03-15 LAB — CBC
HCT: 41.2 % (ref 36.0–46.0)
Hemoglobin: 13.1 g/dL (ref 12.0–15.0)
MCH: 28.7 pg (ref 26.0–34.0)
MCHC: 31.8 g/dL (ref 30.0–36.0)
MCV: 90.2 fL (ref 78.0–100.0)
Platelets: 271 10*3/uL (ref 150–400)
RBC: 4.57 MIL/uL (ref 3.87–5.11)
RDW: 13.1 % (ref 11.5–15.5)
WBC: 8.1 10*3/uL (ref 4.0–10.5)

## 2016-03-15 SURGERY — EXCISION, BONE SPUR, CALCANEUS
Anesthesia: Regional | Laterality: Left

## 2016-03-15 MED ORDER — LACTATED RINGERS IV SOLN: INTRAVENOUS | Status: DC

## 2016-03-15 MED ORDER — MIDAZOLAM HCL 2 MG/2ML IJ SOLN
INTRAMUSCULAR | Status: AC
  Filled 2016-03-15: qty 2

## 2016-03-15 MED ORDER — ONDANSETRON HCL 4 MG PO TABS: 4.0000 mg | ORAL_TABLET | Freq: Four times a day (QID) | ORAL | Status: DC | PRN

## 2016-03-15 MED ORDER — HYDROMORPHONE HCL 1 MG/ML IJ SOLN: 0.2500 mg | INTRAMUSCULAR | Status: DC | PRN

## 2016-03-15 MED ORDER — METHOCARBAMOL 1000 MG/10ML IJ SOLN
500.0000 mg | Freq: Four times a day (QID) | INTRAVENOUS | Status: DC | PRN
  Administered 2016-03-16: 500 mg via INTRAVENOUS
  Filled 2016-03-15 (×2): qty 5

## 2016-03-15 MED ORDER — BUPROPION HCL ER (XL) 150 MG PO TB24
300.0000 mg | ORAL_TABLET | Freq: Every day | ORAL | Status: DC
Start: 2016-03-16 — End: 2016-03-16
  Administered 2016-03-16: 300 mg via ORAL
  Filled 2016-03-15: qty 2

## 2016-03-15 MED ORDER — 0.9 % SODIUM CHLORIDE (POUR BTL) OPTIME
TOPICAL | Status: DC | PRN
  Administered 2016-03-15: 1000 mL

## 2016-03-15 MED ORDER — ONDANSETRON HCL 4 MG/2ML IJ SOLN
4.0000 mg | Freq: Four times a day (QID) | INTRAMUSCULAR | Status: DC | PRN
  Administered 2016-03-16: 4 mg via INTRAVENOUS
  Filled 2016-03-15: qty 2

## 2016-03-15 MED ORDER — MIDAZOLAM HCL 2 MG/2ML IJ SOLN
2.0000 mg | Freq: Once | INTRAMUSCULAR | Status: AC
  Administered 2016-03-15: 2 mg via INTRAVENOUS

## 2016-03-15 MED ORDER — LIDOCAINE 2% (20 MG/ML) 5 ML SYRINGE
INTRAMUSCULAR | Status: DC | PRN
  Administered 2016-03-15: 50 mg via INTRAVENOUS

## 2016-03-15 MED ORDER — SUGAMMADEX SODIUM 200 MG/2ML IV SOLN
INTRAVENOUS | Status: DC | PRN
  Administered 2016-03-15: 300 mg via INTRAVENOUS

## 2016-03-15 MED ORDER — FENTANYL CITRATE (PF) 100 MCG/2ML IJ SOLN
INTRAMUSCULAR | Status: AC
  Administered 2016-03-15: 100 ug via INTRAVENOUS
  Filled 2016-03-15: qty 2

## 2016-03-15 MED ORDER — PANTOPRAZOLE SODIUM 40 MG PO TBEC
40.0000 mg | DELAYED_RELEASE_TABLET | Freq: Every day | ORAL | Status: DC
  Administered 2016-03-16: 40 mg via ORAL
  Filled 2016-03-15: qty 1

## 2016-03-15 MED ORDER — HYDROMORPHONE HCL 1 MG/ML IJ SOLN
0.5000 mg | INTRAMUSCULAR | Status: DC | PRN
  Administered 2016-03-15 – 2016-03-16 (×3): 1 mg via INTRAVENOUS
  Filled 2016-03-15 (×3): qty 1

## 2016-03-15 MED ORDER — LACTATED RINGERS IV SOLN
INTRAVENOUS | Status: DC
  Administered 2016-03-15 (×2): via INTRAVENOUS

## 2016-03-15 MED ORDER — FENTANYL CITRATE (PF) 100 MCG/2ML IJ SOLN
INTRAMUSCULAR | Status: DC | PRN
  Administered 2016-03-15: 100 ug via INTRAVENOUS
  Administered 2016-03-15 (×2): 50 ug via INTRAVENOUS

## 2016-03-15 MED ORDER — FENTANYL CITRATE (PF) 100 MCG/2ML IJ SOLN
INTRAMUSCULAR | Status: AC
  Filled 2016-03-15: qty 2

## 2016-03-15 MED ORDER — OXYCODONE HCL 5 MG PO TABS
5.0000 mg | ORAL_TABLET | ORAL | Status: DC | PRN
  Administered 2016-03-15 – 2016-03-16 (×3): 10 mg via ORAL
  Filled 2016-03-15 (×4): qty 2

## 2016-03-15 MED ORDER — APIXABAN 5 MG PO TABS
5.0000 mg | ORAL_TABLET | Freq: Two times a day (BID) | ORAL | Status: DC
  Administered 2016-03-16: 5 mg via ORAL
  Filled 2016-03-15: qty 1

## 2016-03-15 MED ORDER — DIPHENHYDRAMINE HCL 12.5 MG/5ML PO ELIX: 12.5000 mg | ORAL_SOLUTION | ORAL | Status: DC | PRN

## 2016-03-15 MED ORDER — DIVALPROEX SODIUM ER 500 MG PO TB24
2000.0000 mg | ORAL_TABLET | Freq: Every day | ORAL | Status: DC
  Administered 2016-03-15: 2000 mg via ORAL
  Filled 2016-03-15: qty 4

## 2016-03-15 MED ORDER — CEFAZOLIN SODIUM-DEXTROSE 2-4 GM/100ML-% IV SOLN
2.0000 g | INTRAVENOUS | Status: AC
  Administered 2016-03-15: 2 g via INTRAVENOUS
  Filled 2016-03-15: qty 100

## 2016-03-15 MED ORDER — HYDROCHLOROTHIAZIDE 25 MG PO TABS
25.0000 mg | ORAL_TABLET | Freq: Every day | ORAL | Status: DC
  Filled 2016-03-15: qty 1

## 2016-03-15 MED ORDER — ONDANSETRON HCL 4 MG/2ML IJ SOLN
INTRAMUSCULAR | Status: AC
  Filled 2016-03-15: qty 2

## 2016-03-15 MED ORDER — CHLORHEXIDINE GLUCONATE 4 % EX LIQD: 60.0000 mL | Freq: Once | CUTANEOUS | Status: DC

## 2016-03-15 MED ORDER — BUPIVACAINE-EPINEPHRINE (PF) 0.5% -1:200000 IJ SOLN
INTRAMUSCULAR | Status: DC | PRN
  Administered 2016-03-15: 30 mL via PERINEURAL

## 2016-03-15 MED ORDER — LISINOPRIL-HYDROCHLOROTHIAZIDE 20-25 MG PO TABS: 1.0000 | ORAL_TABLET | Freq: Every day | ORAL | Status: DC

## 2016-03-15 MED ORDER — PROPOFOL 10 MG/ML IV BOLUS
INTRAVENOUS | Status: AC
  Filled 2016-03-15: qty 20

## 2016-03-15 MED ORDER — ALBUTEROL SULFATE (2.5 MG/3ML) 0.083% IN NEBU: 3.0000 mL | INHALATION_SOLUTION | Freq: Four times a day (QID) | RESPIRATORY_TRACT | Status: DC | PRN

## 2016-03-15 MED ORDER — ONDANSETRON HCL 4 MG/2ML IJ SOLN
INTRAMUSCULAR | Status: DC | PRN
  Administered 2016-03-15: 4 mg via INTRAVENOUS

## 2016-03-15 MED ORDER — FENTANYL CITRATE (PF) 100 MCG/2ML IJ SOLN
100.0000 ug | Freq: Once | INTRAMUSCULAR | Status: DC
  Filled 2016-03-15: qty 2

## 2016-03-15 MED ORDER — ACETAMINOPHEN 325 MG PO TABS
650.0000 mg | ORAL_TABLET | Freq: Four times a day (QID) | ORAL | Status: DC | PRN
  Administered 2016-03-16: 650 mg via ORAL
  Filled 2016-03-15: qty 2

## 2016-03-15 MED ORDER — METHOCARBAMOL 500 MG PO TABS: 500.0000 mg | ORAL_TABLET | Freq: Four times a day (QID) | ORAL | Status: DC | PRN

## 2016-03-15 MED ORDER — ROCURONIUM BROMIDE 10 MG/ML (PF) SYRINGE
PREFILLED_SYRINGE | INTRAVENOUS | Status: DC | PRN
  Administered 2016-03-15: 50 mg via INTRAVENOUS

## 2016-03-15 MED ORDER — METOCLOPRAMIDE HCL 5 MG PO TABS: 5.0000 mg | ORAL_TABLET | Freq: Three times a day (TID) | ORAL | Status: DC | PRN

## 2016-03-15 MED ORDER — SUCCINYLCHOLINE CHLORIDE 200 MG/10ML IV SOSY
PREFILLED_SYRINGE | INTRAVENOUS | Status: DC | PRN
  Administered 2016-03-15: 100 mg via INTRAVENOUS

## 2016-03-15 MED ORDER — METOCLOPRAMIDE HCL 5 MG/ML IJ SOLN: 5.0000 mg | Freq: Three times a day (TID) | INTRAMUSCULAR | Status: DC | PRN

## 2016-03-15 MED ORDER — BUPIVACAINE HCL (PF) 0.5 % IJ SOLN
INTRAMUSCULAR | Status: DC | PRN
  Administered 2016-03-15: 10 mL

## 2016-03-15 MED ORDER — ACETAMINOPHEN 650 MG RE SUPP: 650.0000 mg | Freq: Four times a day (QID) | RECTAL | Status: DC | PRN

## 2016-03-15 MED ORDER — MIDAZOLAM HCL 5 MG/5ML IJ SOLN
INTRAMUSCULAR | Status: DC | PRN
  Administered 2016-03-15: 2 mg via INTRAVENOUS

## 2016-03-15 MED ORDER — PROPOFOL 10 MG/ML IV BOLUS
INTRAVENOUS | Status: DC | PRN
  Administered 2016-03-15: 130 mg via INTRAVENOUS

## 2016-03-15 MED ORDER — LISINOPRIL 20 MG PO TABS
20.0000 mg | ORAL_TABLET | Freq: Every day | ORAL | Status: DC
  Filled 2016-03-15: qty 1

## 2016-03-15 SURGICAL SUPPLY — 66 items
ANCH SUT 2 2.9 2 LD TPR NDL (Anchor) ×2 IMPLANT
ANCH SUT KNTLS SLF PNCH MTL (Anchor) ×2 IMPLANT
ANCHOR JUGGERKNOT WTAP NDL 2.9 (Anchor) ×2 IMPLANT
APL SKNCLS STERI-STRIP NONHPOA (GAUZE/BANDAGES/DRESSINGS) ×1
BANDAGE ACE 4X5 VEL STRL LF (GAUZE/BANDAGES/DRESSINGS) ×2 IMPLANT
BANDAGE ACE 6X5 VEL STRL LF (GAUZE/BANDAGES/DRESSINGS) ×2 IMPLANT
BANDAGE ELASTIC 6 VELCRO ST LF (GAUZE/BANDAGES/DRESSINGS) ×1 IMPLANT
BANDAGE ESMARK 6X9 LF (GAUZE/BANDAGES/DRESSINGS) ×1 IMPLANT
BENZOIN TINCTURE PRP APPL 2/3 (GAUZE/BANDAGES/DRESSINGS) ×2 IMPLANT
BIT DRILL JUGRKNT W/NDL BIT2.9 (DRILL) IMPLANT
BLADE LONG MED 31X9 (MISCELLANEOUS) ×1 IMPLANT
BNDG CMPR 9X4 STRL LF SNTH (GAUZE/BANDAGES/DRESSINGS) ×1
BNDG CMPR 9X6 STRL LF SNTH (GAUZE/BANDAGES/DRESSINGS) ×1
BNDG COHESIVE 4X5 TAN STRL (GAUZE/BANDAGES/DRESSINGS) ×2 IMPLANT
BNDG ESMARK 4X9 LF (GAUZE/BANDAGES/DRESSINGS) ×2 IMPLANT
BNDG ESMARK 6X9 LF (GAUZE/BANDAGES/DRESSINGS) ×2
BNDG GAUZE ELAST 4 BULKY (GAUZE/BANDAGES/DRESSINGS) ×2 IMPLANT
CUFF TOURNIQUET SINGLE 34IN LL (TOURNIQUET CUFF) ×1 IMPLANT
CUFF TOURNIQUET SINGLE 44IN (TOURNIQUET CUFF) IMPLANT
DRAPE U-SHAPE 47X51 STRL (DRAPES) ×2 IMPLANT
DRILL JUGGERKNOT W/NDL BIT 2.9 (DRILL) ×2
DRSG ADAPTIC 3X8 NADH LF (GAUZE/BANDAGES/DRESSINGS) ×1 IMPLANT
DRSG EMULSION OIL 3X3 NADH (GAUZE/BANDAGES/DRESSINGS) ×2 IMPLANT
DURAPREP 26ML APPLICATOR (WOUND CARE) ×2 IMPLANT
ELECT REM PT RETURN 9FT ADLT (ELECTROSURGICAL) ×2
ELECTRODE REM PT RTRN 9FT ADLT (ELECTROSURGICAL) ×1 IMPLANT
GAUZE SPONGE 4X4 12PLY STRL (GAUZE/BANDAGES/DRESSINGS) ×2 IMPLANT
GLOVE BIO SURGEON STRL SZ8 (GLOVE) ×8 IMPLANT
GLOVE BIOGEL PI IND STRL 8 (GLOVE) ×1 IMPLANT
GLOVE BIOGEL PI INDICATOR 8 (GLOVE) ×1
GOWN STRL REUS W/ TWL LRG LVL3 (GOWN DISPOSABLE) ×1 IMPLANT
GOWN STRL REUS W/ TWL XL LVL3 (GOWN DISPOSABLE) ×3 IMPLANT
GOWN STRL REUS W/TWL 2XL LVL3 (GOWN DISPOSABLE) ×2 IMPLANT
GOWN STRL REUS W/TWL LRG LVL3 (GOWN DISPOSABLE) ×2
GOWN STRL REUS W/TWL XL LVL3 (GOWN DISPOSABLE) ×6
IMPL ANCHOR PEEK 4.5MM (Anchor) IMPLANT
IMPLANT ANCHOR PEEK 4.5MM (Anchor) ×4 IMPLANT
KIT BASIN OR (CUSTOM PROCEDURE TRAY) ×2 IMPLANT
KIT ROOM TURNOVER OR (KITS) ×2 IMPLANT
NDL 1/2 CIR CATGUT .05X1.09 (NEEDLE) IMPLANT
NEEDLE 1/2 CIR CATGUT .05X1.09 (NEEDLE) IMPLANT
NEEDLE 22X1 1/2 (OR ONLY) (NEEDLE) ×1 IMPLANT
NS IRRIG 1000ML POUR BTL (IV SOLUTION) ×2 IMPLANT
PACK ORTHO EXTREMITY (CUSTOM PROCEDURE TRAY) ×2 IMPLANT
PAD ABD 8X10 STRL (GAUZE/BANDAGES/DRESSINGS) ×1 IMPLANT
PAD ARMBOARD 7.5X6 YLW CONV (MISCELLANEOUS) ×4 IMPLANT
PAD CAST 4YDX4 CTTN HI CHSV (CAST SUPPLIES) ×1 IMPLANT
PADDING CAST COTTON 4X4 STRL (CAST SUPPLIES) ×2
PADDING CAST COTTON 6X4 STRL (CAST SUPPLIES) ×2 IMPLANT
SPONGE GAUZE 4X4 12PLY STER LF (GAUZE/BANDAGES/DRESSINGS) ×1 IMPLANT
STAPLER VISISTAT 35W (STAPLE) ×2 IMPLANT
STRIP CLOSURE SKIN 1/2X4 (GAUZE/BANDAGES/DRESSINGS) ×2 IMPLANT
SUT ETHIBOND 2 OS 4 DA (SUTURE) IMPLANT
SUT FIBERWIRE #2 38 T-5 BLUE (SUTURE)
SUT MNCRL AB 4-0 PS2 18 (SUTURE) ×1 IMPLANT
SUT PROLENE 3 0 PS 2 (SUTURE) IMPLANT
SUT PROLENE 4 0 PS 2 18 (SUTURE) IMPLANT
SUT VIC AB 0 CT1 27 (SUTURE)
SUT VIC AB 0 CT1 27XBRD ANBCTR (SUTURE) IMPLANT
SUT VIC AB 2-0 SH 27 (SUTURE) ×2
SUT VIC AB 2-0 SH 27XBRD (SUTURE) IMPLANT
SUT VIC AB 3-0 FS2 27 (SUTURE) IMPLANT
SUTURE FIBERWR #2 38 T-5 BLUE (SUTURE) IMPLANT
SYR 20CC LL (SYRINGE) ×1 IMPLANT
UNDERPAD 30X30 (UNDERPADS AND DIAPERS) ×1 IMPLANT
WATER STERILE IRR 1000ML POUR (IV SOLUTION) ×1 IMPLANT

## 2016-03-15 NOTE — Anesthesia Procedure Notes (Addendum)
Anesthesia Regional Block:  Popliteal block  Pre-Anesthetic Checklist: ,, timeout performed, Correct Patient, Correct Site, Correct Laterality, Correct Procedure, Correct Position, site marked, Risks and benefits discussed, pre-op evaluation,  At surgeon's request and post-op pain management  Laterality: Right and Left  Prep: Maximum Sterile Barrier Precautions used, chloraprep       Needles:  Injection technique: Single-shot  Needle Type: Echogenic Stimulator Needle     Needle Length: 9cm 9 cm Needle Gauge: 21 and 21 G    Additional Needles:  Procedures: ultrasound guided (picture in chart) and nerve stimulator Popliteal block  Nerve Stimulator or Paresthesia:  Response: Peroneal,  Response: Tibial,   Additional Responses:   Narrative:  Start time: 03/15/2016 11:00 AM End time: 03/15/2016 11:15 AM Injection made incrementally with aspirations every 5 mL. Anesthesiologist: Roderic Palau  Additional Notes: 2% Lidocaine skin wheel. Saphenous block with 10cc of 0.5% Bupivicaine plain.

## 2016-03-15 NOTE — Anesthesia Preprocedure Evaluation (Addendum)
Anesthesia Evaluation  Patient identified by MRN, date of birth, ID band Patient awake    Reviewed: Allergy & Precautions, H&P , NPO status , Patient's Chart, lab work & pertinent test results  History of Anesthesia Complications (+) DIFFICULT AIRWAY  Airway Mallampati: II  TM Distance: >3 FB Neck ROM: Full    Dental no notable dental hx. (+) Teeth Intact, Dental Advisory Given   Pulmonary asthma , sleep apnea , former smoker,    Pulmonary exam normal breath sounds clear to auscultation       Cardiovascular hypertension, Pt. on medications + dysrhythmias Atrial Fibrillation  Rhythm:Regular Rate:Normal     Neuro/Psych Bipolar Disorder negative neurological ROS     GI/Hepatic negative GI ROS, Neg liver ROS,   Endo/Other  Morbid obesity  Renal/GU negative Renal ROS  negative genitourinary   Musculoskeletal   Abdominal   Peds  Hematology negative hematology ROS (+)   Anesthesia Other Findings   Reproductive/Obstetrics negative OB ROS                            Anesthesia Physical Anesthesia Plan  ASA: III  Anesthesia Plan: General and Regional   Post-op Pain Management: GA combined w/ Regional for post-op pain   Induction: Intravenous  Airway Management Planned: Oral ETT and Video Laryngoscope Planned  Additional Equipment:   Intra-op Plan:   Post-operative Plan: Extubation in OR  Informed Consent: I have reviewed the patients History and Physical, chart, labs and discussed the procedure including the risks, benefits and alternatives for the proposed anesthesia with the patient or authorized representative who has indicated his/her understanding and acceptance.   Dental advisory given  Plan Discussed with: CRNA  Anesthesia Plan Comments: (Pt had a block on 9/28 and has some persistent numbness in her toes. I discussed with her the importance of the block for pain control and  that doing another block today will not make her numbness better but also won't make it worse. It should all resolve over the next few days and any persistent numbness after 10 days would be rare. She was agreeable to proceeding with a block for her case today.)       Anesthesia Quick Evaluation

## 2016-03-15 NOTE — Op Note (Signed)
Mary Mcfarland [id number] 03/15/2016   PRE-OP DIAGNOSIS: Left heel insertional spur and pump bump  POST-OP DIAGNOSIS: same  PROCEDURE: spur excisions and TA repair  ANESTHESIA: general and block  [name] G   Dictation #:  ?

## 2016-03-15 NOTE — Interval H&P Note (Signed)
History and Physical Interval Note:  03/15/2016 11:57 AM  Mary Mcfarland  has presented today for surgery, with the diagnosis of LEFT HEEL PUMP BUMP AND ACHILLIES SPUR  The various methods of treatment have been discussed with the patient and family. After consideration of risks, benefits and other options for treatment, the patient has consented to  Procedure(s): [place] (Left) as a surgical intervention .  The patient's history has been reviewed, patient examined, no change in status, stable for surgery.  I have reviewed the patient's chart and labs.  Questions were answered to the patient's satisfaction.     Trenten Watchman G

## 2016-03-15 NOTE — Anesthesia Procedure Notes (Signed)
Procedure Name: Intubation Date/Time: 03/15/2016 1:30 PM Performed by: Anne Fu Pre-anesthesia Checklist: Patient identified, Emergency Drugs available, Suction available, Patient being monitored and Timeout performed Patient Re-evaluated:Patient Re-evaluated prior to inductionOxygen Delivery Method: Circle system utilized Preoxygenation: Pre-oxygenation with 100% oxygen Intubation Type: IV induction Ventilation: Mask ventilation without difficulty and Oral airway inserted - appropriate to patient size Laryngoscope Size: Mac, 4 and 3 Grade View: Grade II Tube type: Oral Tube size: 7.5 mm Number of attempts: 1 Airway Equipment and Method: Bougie stylet and Video-laryngoscopy Placement Confirmation: ETT inserted through vocal cords under direct vision,  positive ETCO2,  CO2 detector and breath sounds checked- equal and bilateral Secured at: 22 cm Tube secured with: Tape Dental Injury: Teeth and Oropharynx as per pre-operative assessment  Difficulty Due To: Difficult Airway- due to large tongue and Difficult Airway- due to anterior larynx Comments: Used video scope placed bogie and exchange tube over without incidence noted clear bilat lung sounds post intubation.

## 2016-03-15 NOTE — Transfer of Care (Signed)
Immediate Anesthesia Transfer of Care Note  Patient: Mary Mcfarland  Procedure(s) Performed: Procedure(s): ACHILLIES TENDON REPAIR AND PUMP BUMP EXCISION (Left)  Patient Location: PACU  Anesthesia Type:General  Level of Consciousness: awake, alert  and oriented  Airway & Oxygen Therapy: Patient Spontanous Breathing and Patient connected to face mask oxygen  Post-op Assessment: Report given to RN, Post -op Vital signs reviewed and stable and Patient moving all extremities  Post vital signs: Reviewed and stable  Last Vitals:  Vitals:   03/15/16 1112 03/15/16 1115  BP: 138/71 (!) 127/49  Pulse: 68   Resp: 20   Temp:      Last Pain:  Vitals:   03/15/16 0954  TempSrc:   PainSc: 7       Patients Stated Pain Goal: 2 ([id number])  Complications: No apparent anesthesia complications

## 2016-03-16 ENCOUNTER — Encounter (HOSPITAL_COMMUNITY): Payer: Self-pay | Admitting: Orthopaedic Surgery

## 2016-03-16 DIAGNOSIS — M7752 Other enthesopathy of left foot: Secondary | ICD-10-CM | POA: Diagnosis not present

## 2016-03-16 MED ORDER — PROMETHAZINE HCL 25 MG PO TABS: 12.5000 mg | ORAL_TABLET | Freq: Four times a day (QID) | ORAL | Status: DC | PRN

## 2016-03-16 MED ORDER — PROMETHAZINE HCL 12.5 MG PO TABS: 12.5000 mg | ORAL_TABLET | Freq: Four times a day (QID) | ORAL | 0 refills | Status: DC | PRN

## 2016-03-16 MED ORDER — OXYCODONE-ACETAMINOPHEN 5-325 MG PO TABS
1.0000 | ORAL_TABLET | ORAL | 0 refills | Status: DC | PRN
Start: 2016-03-16 — End: 2019-07-19

## 2016-03-16 MED ORDER — METHOCARBAMOL 500 MG PO TABS: 500.0000 mg | ORAL_TABLET | Freq: Four times a day (QID) | ORAL | 0 refills | Status: DC | PRN

## 2016-03-16 MED ORDER — PROMETHAZINE HCL 25 MG/ML IJ SOLN
12.5000 mg | Freq: Four times a day (QID) | INTRAMUSCULAR | Status: DC | PRN
  Administered 2016-03-16: 25 mg via INTRAVENOUS
  Filled 2016-03-16: qty 1

## 2016-03-16 NOTE — Care Management Note (Signed)
Case Management Note  Patient Details  Name: Mary Mcfarland MRN: [id number] Date of Birth: [birth year]  Subjective/Objective:  60 yr old female s/p                   Action/Plan:   Expected Discharge Date:                  Expected Discharge Plan:  [place]  In-House Referral:     Discharge planning Services  CM Consult  Post Acute Care Choice:  Durable Medical Equipment, Home Health Choice offered to:  Patient  DME Arranged:  3-N-1, Walker rolling DME Agency:  [place]:  PT [facility] Agency:  [place]  Status of Service:  Completed, signed off  If discussed at [place] of Stay Meetings, dates discussed:    Additional Comments:  Ninfa Meeker, RN 03/16/2016, 12:10 PM

## 2016-03-16 NOTE — Progress Notes (Signed)
Subjective: 1 Day Post-Op Procedure(s) (LRB): ACHILLIES TENDON REPAIR AND PUMP BUMP EXCISION (Left)   Patient lying in bed complaining of nausea and pain this morning. She is hoping that some nausea medicine will get her feeling better.  Activity level:  Non weightbearing left leg Diet tolerance:  ok Voiding:  ok Patient reports pain as mild and moderate.    Objective: Vital signs in last 24 hours: Temp:  [97.7 F (36.5 C)-100.3 F (37.9 C)] 100.3 F (37.9 C) (10/04 0500) Pulse Rate:  [61-77] 75 (10/04 0500) Resp:  [13-20] 16 (10/03 1720) BP: (100-147)/(45-75) 147/50 (10/04 0500) SpO2:  [95 %-100 %] 97 % (10/04 0500) Weight:  [113.4 kg (250 lb)] 113.4 kg (250 lb) (10/03 0922)  Labs:  Recent Labs  03/15/16 1004  HGB 13.1    Recent Labs  03/15/16 1004  WBC 8.1  RBC 4.57  HCT 41.2  PLT 271    Recent Labs  03/15/16 1004  NA 141  K 4.0  CL 104  CO2 28  BUN 17  CREATININE 0.62  GLUCOSE 103*  CALCIUM 9.2    Recent Labs  03/15/16 1004  INR 0.97    Physical Exam:  Neurologically intact ABD soft Neurovascular intact Sensation intact distally Intact pulses distally Dorsiflexion/Plantar flexion intact Incision: dressing C/D/I and no drainage No cellulitis present Compartment soft  Assessment/Plan:  1 Day Post-Op Procedure(s) (LRB): ACHILLIES TENDON REPAIR AND PUMP BUMP EXCISION (Left) Advance diet Up with therapy D/C IV fluids  Plan for discharge home this afternoon if feeling better. I will add phenergan to help with nausea. She will remain non-weightbearing on her left leg. Follow up in office 7-10 days. She will keep splint clean and dry until follow up.   Mary Mcfarland, Larwance Sachs 03/16/2016, 8:07 AM

## 2016-03-16 NOTE — Op Note (Signed)
Mary Mcfarland, Mary Mcfarland         ACCOUNT NO.:  [id number]  MEDICAL RECORD NO.:  [id number]  LOCATION:  [id number]                        FACILITY:  [facility]  PHYSICIAN:  Monico Blitz. Atzel Mccambridge, M.D.DATE OF BIRTH:  May 15, 1956  DATE OF PROCEDURE:  03/15/2016 DATE OF DISCHARGE:                              OPERATIVE REPORT   PREOPERATIVE DIAGNOSES: 1. Left heel Achilles insertional spur. 2. Left heel pump bump.  POSTOPERATIVE DIAGNOSES: 1. Left heel Achilles insertional spur. 2. Left heel pump bump.  PROCEDURES: 1. Left heel insertional spur excision. 2. Left heel Achilles repair. 3. Left heel pump bump excision.  ANESTHESIA:  General and block.  ATTENDING SURGEON:  Monico Blitz. Rhona Raider, M.D.  ASSISTANT:  Loni Dolly, PA.  INDICATION FOR PROCEDURE:  The patient is a 61 year old woman with a long history of left heel pain.  This has persisted despite various shoe modifications and pads.  She has 2 large spurs at her heel and has difficulty with shoe wear and with ambulation.  At this point, she is offered operative intervention.  Informed operative consent was obtained after discussion of possible complications including reaction to anesthesia, infection, wound healing problems, and Achilles rupture. Plan of procedure was for excision of her pump bump and Achilles insertional spur and repair of her Achilles tendon.  SUMMARY OF FINDINGS AND PROCEDURE:  Under general anesthesia and a block, a left heel set of procedures was performed.  She did have a prominent pump bump which we removed along with a large insertional spur at the Achilles attachment site.  This was excised necessitating removal of about 50% of the Achilles footprint.  The Achilles was then repaired in suture bridge fashion using Biomet anchors.  Loni Dolly assisted throughout and was invaluable to the completion of the case mostly in that he maintained retraction so I could perform the procedure.  He also closed  simultaneously to help minimize OR time.  I used fluoroscopy throughout the case to make appropriate intraoperative decisions and read all of these views myself.  The patient was closed primarily and scheduled to go home.  DESCRIPTION OF PROCEDURE:  The patient was taken to the operating suite where general anesthetic was applied without difficulty.  She was also given a block in the pre-anesthesia area.  She was positioned prone with chest rolls utilized and all bony prominences padded.  She was then prepped and draped in normal sterile fashion.  After the administration of IV Kefzol and an appropriate time-out, the left leg was elevated, exsanguinated, and a tourniquet inflated about the calf.  I made a posteromedial longitudinal incision with dissection down to the Achilles which was split midline all the way to the insertion site.  I then exposed the insertional spur which was toward the medial aspect of the Achilles insertion.  This necessitated elevation of 50% of the Achilles footprint.  An oscillating saw was used to remove this spur along with a rongeur.  I then isolated her pump bump and this was excised under fluoroscopic guidance as well again using a saw.  We then placed 2 medial anchors which were JuggerKnot anchors by [employer].  These had 2 sutures emanating from each.  I used 1 set of sutures to repair  the medial aspect of the Achilles down to bone and then again passed both sutures of another run of the other JuggerKnot suture into the lateral aspect of the Achilles tendon.  This was utilized to secure this down to bone.  I then crossed these sutures and placed 2 [place] anchors distally performing a suture bridge repair.  The remaining limbs of suture from the [place] were passed through the Achilles more proximal and in a [place] fashion and tied outside the tendon for additional repair.  The wound was then thoroughly irrigated followed by release of tourniquet.  A  small amount of bleeding was easily controlled with some pressure.  We then reapproximated the subcutaneous tissues with 2-0 undyed Vicryl and skin with a subcuticular stitch and Steri- Strips.  Adaptic was applied followed by dry gauze and a posterior splint of plaster with the ankle in neutral position.  Estimated blood loss and intraoperative fluids as well as accurate tourniquet time can be obtained from anesthesia records.  DISPOSITION:  The patient was extubated in the operating room and taken to recovery room in stable condition.  She will potentially stay overnight due to sleep apnea issues and might go home later today.  If she does go home, I will contact her by phone tonight.     Monico Blitz Rhona Raider, M.D.     PGD/MEDQ  D:  03/15/2016  T:  03/16/2016  Job:  [id number]

## 2016-03-16 NOTE — Evaluation (Signed)
Occupational Therapy Evaluation and Discharge Patient Details Name: Mary Mcfarland MRN: [id number] DOB: 11/11/1955 Today's Date: 03/16/2016    History of Present Illness Pt is a 60 y/o female presenting post op achilles tendon repair and pump bump excision (left).  Pt has a past medical history of Asthma; Bipolar disorder ([place]); ; Dysrhythmia; Hypertension; and Sleep apnea.   Clinical Impression   Pt educated on use of DME, and strategies for ADL with LLE in NWB status. Pt confident in self-care abilities and able to perform all because she has already been doing it at home prior to surgery. Pt able to demonstrate successful transfer from bed to chair with ambulation using RW maintaining NWB status. Pt performed seated grooming. Pt safe to d/c with 24 hour supervision from daughter and son in law. No further OT needs at this time, OT to sign off.    Follow Up Recommendations  No OT follow up;Supervision/Assistance - 24 hour    Equipment Recommendations  None recommended by OT (Pt states that she has all DME)    Recommendations for Other Services       Precautions / Restrictions Precautions Precautions: Fall Restrictions Weight Bearing Restrictions: Yes LLE Weight Bearing: Non weight bearing      Mobility Bed Mobility Overal bed mobility: Needs Assistance Bed Mobility: Supine to Sit     Supine to sit: Supervision;HOB elevated Sit to supine: Supervision;HOB elevated   General bed mobility comments: pt uses momentum to A with bringing her trunk up to sitting.  pt uses Bil UEs to A with moving L LE.    Transfers Overall transfer level: Needs assistance Equipment used: Rolling walker (2 wheeled) Transfers: Sit to/from Stand Sit to Stand: Supervision Stand pivot transfers: Min guard       General transfer comment: Pt impulsive and quick to stand, but did not need physical A from therapist.    Balance Overall balance assessment: Needs  assistance Sitting-balance support: No upper extremity supported;Feet supported Sitting balance-Leahy Scale: Good     Standing balance support: Bilateral upper extremity supported;During functional activity Standing balance-Leahy Scale: Poor Standing balance comment: Heavy reliance on UEs                            ADL Overall ADL's : Needs assistance/impaired Eating/Feeding: Set up;Sitting Eating/Feeding Details (indicate cue type and reason): eating lunch sitting in recliner "I wish I had one of these at home" Grooming: Wash/dry hands;Wash/dry face;Brushing hair;Sitting;Set up Grooming Details (indicate cue type and reason): sitting in recliner with set up Upper Body Bathing: Set up;Sitting Upper Body Bathing Details (indicate cue type and reason): Pt has been practicing getting in the tub and using her shower chair pre-op Lower Body Bathing: Min guard Lower Body Bathing Details (indicate cue type and reason): educated Pt on use of long handle sponge      Lower Body Dressing: Minimal assistance;Sit to/from stand;With caregiver independent assisting Lower Body Dressing Details (indicate cue type and reason): "I have been doing it like this for the past 2 weeks" Toilet Transfer: Supervision/safety;Ambulation;RW;Comfort height toilet Toilet Transfer Details (indicate cue type and reason): simulated with recliner       Tub/Shower Transfer Details (indicate cue type and reason): Pt states that she has been getting in the tub NWB for the past 2 weeks using her shower chair and assitance from her daughter, and she has no questions or concerns Functional mobility during ADLs: Min guard;Rolling walker (approx  3 feet to recliner from bed) General ADL Comments: Pt pleasant and willing to work with therapy. Shared that main concern is mobility to and from ADL.     Vision     Perception     Praxis      Pertinent Vitals/Pain Pain Assessment: 0-10 Pain Score: 9  Pain  Location: back of left foot Pain Descriptors / Indicators: Discomfort;Grimacing;Guarding;Sore Pain Intervention(s): Monitored during session;Repositioned     Hand Dominance Right   Extremity/Trunk Assessment Upper Extremity Assessment Upper Extremity Assessment: Overall WFL for tasks assessed   Lower Extremity Assessment Lower Extremity Assessment: LLE deficits/detail;Generalized weakness LLE Deficits / Details: NWB post-op, limited due to pain LLE: Unable to fully assess due to pain LLE Coordination: decreased fine motor   Cervical / Trunk Assessment Cervical / Trunk Assessment: Normal   Communication Communication Communication: No difficulties   Cognition Arousal/Alertness: Awake/alert Behavior During Therapy: WFL for tasks assessed/performed Overall Cognitive Status: Within Functional Limits for tasks assessed                     General Comments       Exercises       Shoulder Instructions      Home Living Family/patient expects to be discharged to:: Private residence Living Arrangements: Children Available Help at Discharge: Family;Available 24 hours/day Type of Home: House Home Access: Stairs to enter [employer] of Steps: 2 Entrance Stairs-Rails: None Home Layout: One level     Bathroom Shower/Tub: Tub/shower unit Shower/tub characteristics: Architectural technologist: Standard Bathroom Accessibility: Yes How Accessible: Accessible via walker Home Equipment: Bedside commode;Shower seat          Prior Functioning/Environment Level of Independence: Needs assistance  Gait / Transfers Assistance Needed: pt used knee scooter mostly and crawled up/down her outside steps. ADL's / Homemaking Assistance Needed: pt used shower seat to bathe.            OT Problem List: Decreased strength;Decreased range of motion;Decreased activity tolerance;Impaired balance (sitting and/or standing);Pain   OT Treatment/Interventions:      OT  Goals(Current goals can be found in the care plan section) Acute Rehab OT Goals Patient Stated Goal: To get Home OT Goal Formulation: All assessment and education complete, DC therapy  OT Frequency:     Barriers to D/C:            Co-evaluation              End of Session Equipment Utilized During Treatment: Gait belt;Rolling walker Nurse Communication: Weight bearing status  Activity Tolerance: Patient tolerated treatment well;Patient limited by pain Patient left: in chair;with call bell/phone within reach   Time: 1216-1238 OT Time Calculation (min): 22 min Charges:  OT General Charges $OT Visit: 1 Procedure OT Evaluation $OT Eval Low Complexity: 1 Procedure G-Codes: OT G-codes **NOT FOR INPATIENT CLASS** Functional Assessment Tool Used: clinical judgement Functional Limitation: Self care Self Care Current Status [id number]): At least 20 percent but less than 40 percent impaired, limited or restricted Self Care Goal Status [id number]): 0 percent impaired, limited or restricted Self Care Discharge Status [phone]): At least 20 percent but less than 40 percent impaired, limited or restricted  Jaci Carrel 03/16/2016, 1:59 PM Hulda Humphrey OTR/L [phone]

## 2016-03-16 NOTE — Evaluation (Signed)
Physical Therapy Evaluation Patient Details Name: Mary Mcfarland MRN: [id number] DOB: 1956/04/03 Today's Date: 03/16/2016   History of Present Illness  Pt is a 60 y/o female presenting post op achilles tendon repair and pump bump excision (left).  Pt has a past medical history of Asthma; Bipolar disorder ([place]); ; Dysrhythmia; Hypertension; and Sleep apnea.  Clinical Impression  Pt overall unsteady with mobility and fatigues quickly.  Reviewed mobility with RW and with knee scooter.  Discussed stair technique with pt and daughter, who both indicate they will continue to have pt crawl up the front stairs.  Pt will need continued therapy to improve overall safety with mobility.  Will continue to follow if remains on acute.      Follow Up Recommendations Home health PT;Supervision/Assistance - 24 hour    Equipment Recommendations  Rolling walker with 5" wheels    Recommendations for Other Services       Precautions / Restrictions Precautions Precautions: Fall Restrictions Weight Bearing Restrictions: Yes LLE Weight Bearing: Non weight bearing      Mobility  Bed Mobility Overal bed mobility: Needs Assistance Bed Mobility: Supine to Sit;Sit to Supine     Supine to sit: Supervision;HOB elevated Sit to supine: Supervision;HOB elevated   General bed mobility comments: pt uses momentum to A with bringing her trunk up to sitting.  pt uses Bil UEs to A with moving L LE.    Transfers Overall transfer level: Needs assistance Equipment used: Rolling walker (2 wheeled) (knee scooter) Transfers: Sit to/from [employer] Sit to Stand: Min guard Stand pivot transfers: Min guard       General transfer comment: pt needs cues for safe technique and use of UEs.  pt able to perform sit to/from stand to utilize RW and then SPT bed to/from knee scooter.  pt unsteady and with heavy reliance on UEs during SPT.    Ambulation/Gait Ambulation/Gait assistance: Min  assist Ambulation Distance (Feet): 15 Feet Assistive device: Rolling walker (2 wheeled) Gait Pattern/deviations: Step-to pattern     General Gait Details: cues to slow down and attend to balance and safety.  pt indicates very fatigued with ambulation.  pt also performed mobility with knee scooter MinA for balance ~50'.  Cues for safe technique, to slow down, and for use of brakes.    Stairs Stairs: Yes       General stair comments: pt and daughter indicate pt has been crawling up her front steps to enter her home and crawling until she reached her ottoman to pull herself up to sitting.  Discussed options for performing stairs and feel at this time pt is safest to continue to crawl as she does not have the strength or balance to attempt to hop up stairs.    Wheelchair Mobility    Modified Rankin (Stroke Patients Only)       Balance Overall balance assessment: Needs assistance Sitting-balance support: No upper extremity supported;Feet supported Sitting balance-Leahy Scale: Good     Standing balance support: Bilateral upper extremity supported;During functional activity Standing balance-Leahy Scale: Poor Standing balance comment: Heavy reliance on UEs.                             Pertinent Vitals/Pain Pain Assessment: 0-10 Pain Score: 8  Pain Location: L foot/ankle Pain Descriptors / Indicators: Grimacing;Guarding;Pressure;Sore Pain Intervention(s): Monitored during session;Premedicated before session;Repositioned    Home Living Family/patient expects to be discharged to:: Private residence Living Arrangements:  Children Available Help at Discharge: Family;Available 24 hours/day Type of Home: House Home Access: Stairs to enter Entrance Stairs-Rails: None Entrance Stairs-Number of Steps: 2 Home Layout: One level Home Equipment: Bedside commode;Shower seat (Knee scooter)      Prior Function Level of Independence: Needs assistance   Gait / Transfers  Assistance Needed: pt used knee scooter mostly and crawled up/down her outside steps.  ADL's / Homemaking Assistance Needed: pt used shower seat to bathe.        Hand Dominance        Extremity/Trunk Assessment   Upper Extremity Assessment: Defer to OT evaluation           Lower Extremity Assessment: Generalized weakness;LLE deficits/detail   LLE Deficits / Details: pt able to move LE against gravity, but overall very limited due to pain.  Sensation intact.    Cervical / Trunk Assessment: Normal  Communication   Communication: No difficulties  Cognition Arousal/Alertness: Awake/alert Behavior During Therapy: WFL for tasks assessed/performed Overall Cognitive Status: Within Functional Limits for tasks assessed                      General Comments      Exercises     Assessment/Plan    PT Assessment Patient needs continued PT services  PT Problem List Decreased strength;Decreased activity tolerance;Decreased balance;Decreased mobility;Decreased knowledge of use of DME;Obesity;Pain          PT Treatment Interventions DME instruction;Gait training;Stair training;Functional mobility training;Therapeutic activities;Therapeutic exercise;Balance training;Patient/family education    PT Goals (Current goals can be found in the Care Plan section)  Acute Rehab PT Goals Patient Stated Goal: Home PT Goal Formulation: With patient/family Time For Goal Achievement: 03/23/16 Potential to Achieve Goals: Good    Frequency Min 5X/week   Barriers to discharge        Co-evaluation               End of Session Equipment Utilized During Treatment: Gait belt Activity Tolerance: Patient limited by fatigue;Patient limited by pain Patient left: in bed;with call bell/phone within reach;with family/visitor present Nurse Communication: Mobility status    Functional Assessment Tool Used: Clinical Judgement Functional Limitation: Mobility: Walking and moving  around Mobility: Walking and Moving Around Current Status [id number]): At least 20 percent but less than 40 percent impaired, limited or restricted Mobility: Walking and Moving Around Goal Status [phone]): 0 percent impaired, limited or restricted    Time: [id number] PT Time Calculation (min) (ACUTE ONLY): 35 min   Charges:   PT Evaluation $PT Eval Moderate Complexity: 1 Procedure PT Treatments $Gait Training: 8-22 mins   PT G Codes:   PT G-Codes **NOT FOR INPATIENT CLASS** Functional Assessment Tool Used: Clinical Judgement Functional Limitation: Mobility: Walking and moving around Mobility: Walking and Moving Around Current Status [id number]): At least 20 percent but less than 40 percent impaired, limited or restricted Mobility: Walking and Moving Around Goal Status [phone]): 0 percent impaired, limited or restricted    Mary Mcfarland, [place] 03/16/2016, 12:03 PM

## 2016-03-16 NOTE — Progress Notes (Signed)
Reviewed discharge information/medications with patient.  Talked to Mitzi Hansen, patient will be going by Dr. Jerald Kief office to pick up different pain medication.  Answered all of her questions.  Waiting for daughter to pick up.

## 2016-03-16 NOTE — Discharge Summary (Signed)
Patient ID: TANSEY KASTER MRN: [id number] DOB/AGE: 08-22-1955 60 y.o.  Admit date: 03/15/2016 Discharge date: 03/16/2016  Admission Diagnoses:  Principal Problem:   Haglund's deformity of right heel Active Problems:   Heel spur, right   Discharge Diagnoses:  Same  Past Medical History:  Diagnosis Date  . Asthma   . Bipolar disorder ([place])   . Difficult intubation    Narrow airway  . Dysrhythmia    A-fib  . Hypertension   . Sleep apnea     Surgeries: Procedure(s): ACHILLIES TENDON REPAIR AND PUMP BUMP EXCISION on 03/15/2016   Consultants:   Discharged Condition: Improved  Hospital Course: DAWNIELLE TACKMAN is an 60 y.o. female who was admitted 03/15/2016 for operative treatment ofHaglund's deformity of right heel. Patient has severe unremitting pain that affects sleep, daily activities, and work/hobbies. After pre-op clearance the patient was taken to the operating room on 03/15/2016 and underwent  Procedure(s): [place].    Patient was given perioperative antibiotics: Anti-infectives    Start     Dose/Rate Route Frequency Ordered Stop   03/15/16 0932  ceFAZolin (ANCEF) IVPB 2g/100 mL premix     2 g 200 mL/hr over 30 Minutes Intravenous On call to O.R. 03/15/16 0932 03/15/16 1318       Patient was given sequential compression devices, early ambulation, and chemoprophylaxis to prevent DVT.  Patient benefited maximally from hospital stay and there were no complications.    Recent vital signs: Patient Vitals for the past 24 hrs:  BP Temp Temp src Pulse Resp SpO2 Height Weight  03/16/16 0500 (!) 147/50 100.3 F (37.9 C) Oral 75 - 97 % - -  03/16/16 0100 125/60 98.7 F (37.1 C) Oral 73 - 99 % - -  03/15/16 2100 (!) 127/52 98.9 F (37.2 C) Oral 71 - 98 % - -  03/15/16 2023 (!) 120/48 - - 72 - - - -  03/15/16 1735 (!) 100/45 97.7 F (36.5 C) - - - - - -  03/15/16 1720 (!) 114/45 - - 77 16 98 % - -  03/15/16 1650 -  98.5 F (36.9 C) - - - - - -  03/15/16 1635 120/72 - - 67 20 99 % - -  03/15/16 1620 105/66 - - 71 17 95 % - -  03/15/16 1605 126/69 - - 66 13 98 % - -  03/15/16 1550 109/65 - - 64 14 98 % - -  03/15/16 1545 - - - 65 20 98 % - -  03/15/16 1535 125/63 - - 65 13 98 % - -  03/15/16 1520 113/67 - - 66 19 96 % - -  03/15/16 1505 134/64 - - 71 13 100 % - -  03/15/16 1450 111/75 - - 77 15 97 % - -  03/15/16 1445 111/75 97.7 F (36.5 C) - 70 15 99 % - -  03/15/16 1115 (!) 127/49 - - - - - - -  03/15/16 1112 138/71 - - 68 20 100 % - -  03/15/16 1047 (!) 112/48 - - 61 16 100 % - -  03/15/16 1042 (!) 123/51 - - 62 14 98 % - -  03/15/16 0922 (!) 133/58 98.1 F (36.7 C) Oral 69 18 97 % 5\' 6"  (1.676 m) 113.4 kg (250 lb)     Recent laboratory studies:  Recent Labs  03/15/16 1004  WBC 8.1  HGB 13.1  HCT 41.2  PLT 271  NA 141  K 4.0  CL 104  CO2 28  BUN 17  CREATININE 0.62  GLUCOSE 103*  INR 0.97  CALCIUM 9.2     Discharge Medications:     Medication List    STOP taking these medications   HYDROcodone-acetaminophen 5-325 MG tablet Commonly known as:  NORCO/VICODIN     TAKE these medications   albuterol 108 (90 Base) MCG/ACT inhaler Commonly known as:  PROVENTIL HFA;VENTOLIN HFA Inhale 2 puffs into the lungs every 6 (six) hours as needed for wheezing or shortness of breath.   buPROPion 300 MG 24 hr tablet Commonly known as:  WELLBUTRIN XL Take 300 mg by mouth daily.   ciprofloxacin 500 MG tablet Commonly known as:  CIPRO Take 1 tablet (500 mg total) by mouth 2 (two) times daily.   divalproex 500 MG 24 hr tablet Commonly known as:  DEPAKOTE ER Take 2,000 mg by mouth at bedtime.   ELIQUIS 5 MG Tabs tablet Generic drug:  apixaban Take 5 mg by mouth 2 (two) times daily.   lisinopril-hydrochlorothiazide 20-25 MG tablet Commonly known as:  PRINZIDE,ZESTORETIC Take 1 tablet by mouth daily.   methocarbamol 500 MG tablet Commonly known as:  ROBAXIN Take 1 tablet (500  mg total) by mouth every 6 (six) hours as needed for muscle spasms.   omeprazole 20 MG capsule Commonly known as:  PRILOSEC Take 20 mg by mouth daily.   oxyCODONE-acetaminophen 5-325 MG tablet Commonly known as:  ROXICET Take 1-2 tablets by mouth every 4 (four) hours as needed for severe pain.   phenazopyridine 200 MG tablet Commonly known as:  PYRIDIUM Take 1 tablet (200 mg total) by mouth 3 (three) times daily as needed for pain.   promethazine 12.5 MG tablet Commonly known as:  PHENERGAN Take 1-2 tablets (12.5-25 mg total) by mouth every 6 (six) hours as needed for nausea.       Diagnostic Studies: No results found.  Disposition: 01-Home or Self Care  Discharge Instructions    Call MD / Call 911    Complete by:  As directed    If you experience chest pain or shortness of breath, CALL 911 and be transported to the hospital emergency room.  If you develope a fever above 101 F, pus (white drainage) or increased drainage or redness at the wound, or calf pain, call your surgeon's office.   Constipation Prevention    Complete by:  As directed    Drink plenty of fluids.  Prune juice may be helpful.  You may use a stool softener, such as Colace (over the counter) 100 mg twice a day.  Use MiraLax (over the counter) for constipation as needed.   Diet - low sodium heart healthy    Complete by:  As directed    Discharge instructions    Complete by:  As directed    Ice and elevate. Keep splint clean and dry until follow up in 2 weeks. Phenergan for nausea and Percocet for pain. Must remain non-weightbearing on left leg. Follow up in office 2 weeks post op.   Increase activity slowly as tolerated    Complete by:  As directed       Follow-up Information    DALLDORF,PETER G, MD. Schedule an appointment as soon as possible for a visit in 1 week.   Specialty:  Orthopedic Surgery Contact information: [address] [place] [address] [phone]            Signed: Rich Fuchs 03/16/2016, 8:15 AM

## 2016-03-16 NOTE — Discharge Instructions (Signed)

## 2016-03-19 NOTE — Anesthesia Postprocedure Evaluation (Signed)
Anesthesia Post Note  Patient: Mary Mcfarland  Procedure(s) Performed: Procedure(s) (LRB): ACHILLIES TENDON REPAIR AND PUMP BUMP EXCISION (Left)  Patient location during evaluation: PACU Anesthesia Type: General Level of consciousness: awake Pain management: pain level controlled Vital Signs Assessment: post-procedure vital signs reviewed and stable Respiratory status: spontaneous breathing Cardiovascular status: stable Postop Assessment: no signs of nausea or vomiting Anesthetic complications: no    Last Vitals:  Vitals:   03/16/16 0954 03/16/16 1245  BP: (!) 135/40 (!) 121/59  Pulse:  69  Resp:  16  Temp:  37.5 C    Last Pain:  Vitals:   03/16/16 1245  TempSrc: Oral  PainSc:                  Yer Olivencia

## 2016-06-01 ENCOUNTER — Other Ambulatory Visit (HOSPITAL_COMMUNITY): Payer: Self-pay | Admitting: Orthopaedic Surgery

## 2016-06-01 ENCOUNTER — Ambulatory Visit (HOSPITAL_COMMUNITY)
Admission: RE | Admit: 2016-06-01 | Discharge: 2016-06-01 | Disposition: A | Discharge status: Home / Self Care | Payer: BLUE CROSS/BLUE SHIELD | Source: Ambulatory Visit | Attending: Family Medicine | Admitting: Family Medicine

## 2016-06-01 DIAGNOSIS — M7989 Other specified soft tissue disorders: Secondary | ICD-10-CM | POA: Diagnosis not present

## 2016-06-01 DIAGNOSIS — M79605 Pain in left leg: Secondary | ICD-10-CM

## 2016-06-01 NOTE — Progress Notes (Signed)
VASCULAR LAB PRELIMINARY  PRELIMINARY  PRELIMINARY  PRELIMINARY  Left lower extremity venous duplex completed.    Preliminary report:  Left:  No evidence of DVT, superficial thrombosis, or Baker's cyst.  Jahson Emanuele, RVS 06/01/2016, 4:52 PM

## 2018-02-07 DIAGNOSIS — M545 Low back pain: Secondary | ICD-10-CM | POA: Diagnosis not present

## 2018-02-07 DIAGNOSIS — M25551 Pain in right hip: Secondary | ICD-10-CM | POA: Diagnosis not present

## 2018-02-09 DIAGNOSIS — I10 Essential (primary) hypertension: Secondary | ICD-10-CM | POA: Diagnosis not present

## 2018-02-09 DIAGNOSIS — E559 Vitamin D deficiency, unspecified: Secondary | ICD-10-CM | POA: Diagnosis not present

## 2018-02-09 DIAGNOSIS — E782 Mixed hyperlipidemia: Secondary | ICD-10-CM | POA: Diagnosis not present

## 2018-02-15 DIAGNOSIS — Z23 Encounter for immunization: Secondary | ICD-10-CM | POA: Diagnosis not present

## 2018-02-15 DIAGNOSIS — J4520 Mild intermittent asthma, uncomplicated: Secondary | ICD-10-CM | POA: Diagnosis not present

## 2018-02-15 DIAGNOSIS — E559 Vitamin D deficiency, unspecified: Secondary | ICD-10-CM | POA: Diagnosis not present

## 2018-02-15 DIAGNOSIS — Z0000 Encounter for general adult medical examination without abnormal findings: Secondary | ICD-10-CM | POA: Diagnosis not present

## 2018-02-15 DIAGNOSIS — E782 Mixed hyperlipidemia: Secondary | ICD-10-CM | POA: Diagnosis not present

## 2018-02-15 DIAGNOSIS — I10 Essential (primary) hypertension: Secondary | ICD-10-CM | POA: Diagnosis not present

## 2018-02-16 ENCOUNTER — Other Ambulatory Visit: Payer: Self-pay | Admitting: Family Medicine

## 2018-02-16 DIAGNOSIS — Z1231 Encounter for screening mammogram for malignant neoplasm of breast: Secondary | ICD-10-CM

## 2018-06-30 DIAGNOSIS — J029 Acute pharyngitis, unspecified: Secondary | ICD-10-CM | POA: Diagnosis not present

## 2018-06-30 DIAGNOSIS — J069 Acute upper respiratory infection, unspecified: Secondary | ICD-10-CM | POA: Diagnosis not present

## 2019-02-12 DIAGNOSIS — K5792 Diverticulitis of intestine, part unspecified, without perforation or abscess without bleeding: Secondary | ICD-10-CM | POA: Diagnosis not present

## 2019-06-18 DIAGNOSIS — R000 Tachycardia, unspecified: Secondary | ICD-10-CM | POA: Diagnosis not present

## 2019-06-21 ENCOUNTER — Ambulatory Visit: Payer: BLUE CROSS/BLUE SHIELD | Admitting: Cardiovascular Disease

## 2019-06-21 ENCOUNTER — Other Ambulatory Visit: Payer: Self-pay

## 2019-06-21 ENCOUNTER — Encounter: Payer: Self-pay | Admitting: Cardiovascular Disease

## 2019-06-21 DIAGNOSIS — I10 Essential (primary) hypertension: Secondary | ICD-10-CM | POA: Insufficient documentation

## 2019-06-21 DIAGNOSIS — I4891 Unspecified atrial fibrillation: Secondary | ICD-10-CM | POA: Diagnosis not present

## 2019-06-21 DIAGNOSIS — R072 Precordial pain: Secondary | ICD-10-CM | POA: Diagnosis not present

## 2019-06-21 DIAGNOSIS — I480 Paroxysmal atrial fibrillation: Secondary | ICD-10-CM | POA: Insufficient documentation

## 2019-06-21 DIAGNOSIS — Z8249 Family history of ischemic heart disease and other diseases of the circulatory system: Secondary | ICD-10-CM | POA: Insufficient documentation

## 2019-06-21 DIAGNOSIS — E782 Mixed hyperlipidemia: Secondary | ICD-10-CM | POA: Insufficient documentation

## 2019-06-21 DIAGNOSIS — E785 Hyperlipidemia, unspecified: Secondary | ICD-10-CM | POA: Insufficient documentation

## 2019-06-21 NOTE — Assessment & Plan Note (Signed)
Remote history of transient A. fib worked up by Dr. Wynonia Lawman several years ago.  She started feeling somewhat weak and dyspneic with an dizzy approximately-3 weeks ago and was found to be in A. fib when she visited her PCPs office 06/19/2019.  He did place her on a beta-blocker as well as Eliquis oral anticoagulation.  I am going to get a 2D echocardiogram, thyroid function tests and arrange for her to undergo elective outpatient DC cardioversion sometime in the next 4 to 6 weeks.

## 2019-06-21 NOTE — Assessment & Plan Note (Signed)
History of hyperlipidemia not on statin therapy with lipid profile performed 02/09/2018 revealing total cholesterol 244, LDL 170 and HDL of 54.  I am going to repeat a fasting lipid profile

## 2019-06-21 NOTE — Assessment & Plan Note (Signed)
History of essential potential blood pressure measured today 117/74.  She is on lisinopril and hydrochlorothiazide as well as a beta-blocker.

## 2019-06-21 NOTE — Patient Instructions (Signed)
Medication Instructions:  Your physician recommends that you continue on your current medications as directed. Please refer to the Current Medication list given to you today.  If you need a refill on your cardiac medications before your next appointment, please call your pharmacy.   Lab work: Fasting Lipids, Hepatic Function and Thyroid Function If you have labs (blood work) drawn today and your tests are completely normal, you will receive your results only by: MyChart Message (if you have MyChart) OR A paper copy in the mail If you have any lab test that is abnormal or we need to change your treatment, we will call you to review the results.  Testing/Procedures: Your physician has requested that you have an echocardiogram. Echocardiography is a painless test that uses sound waves to create images of your heart. It provides your doctor with information about the size and shape of your heart and how well your heart's chambers and valves are working. This procedure takes approximately one hour. There are no restrictions for this procedure. [place] [address]  AND  Coronary Calcium Score  Follow-Up: At [facility], you and your health needs are our priority.  As part of our continuing mission to provide you with exceptional heart care, we have created designated Provider Care Teams.  These Care Teams include your primary Cardiologist (physician) and Advanced Practice Providers (APPs -  Physician Assistants and Nurse Practitioners) who all work together to provide you with the care you need, when you need it. You may see Dr Gwenlyn Found or one of the following Advanced Practice Providers on your designated Care Team:    Kerin Ransom, PA-C  [place], [place]  Coletta Memos, [place]  Your physician wants you to follow-up in: 1 month with Dr Gwenlyn Found  Any Other Special Instructions Will Be Listed Below (If Applicable).   Coronary Calcium Scan A coronary calcium scan is an imaging  test used to look for deposits of plaque in the inner lining of the blood vessels of the heart (coronary arteries). Plaque is made up of calcium, protein, and fatty substances. These deposits of plaque can partly clog and narrow the coronary arteries without producing any symptoms or warning signs. This puts a person at risk for a heart attack. This test is recommended for people who are at moderate risk for heart disease. The test can find plaque deposits before symptoms develop. Tell a health care provider about:  Any allergies you have.  All medicines you are taking, including vitamins, herbs, eye drops, creams, and over-the-counter medicines.  Any problems you or family members have had with anesthetic medicines.  Any blood disorders you have.  Any surgeries you have had.  Any medical conditions you have.  Whether you are pregnant or may be pregnant. What are the risks? Generally, this is a safe procedure. However, problems may occur, including:  Harm to a pregnant woman and her unborn baby. This test involves the use of radiation. Radiation exposure can be dangerous to a pregnant woman and her unborn baby. If you are pregnant or think you may be pregnant, you should not have this procedure done.  Slight increase in the risk of cancer. This is because of the radiation involved in the test. What happens before the procedure? Ask your health care provider for any specific instructions on how to prepare for this procedure. You may be asked to avoid products that contain caffeine, tobacco, or nicotine for 4 hours before the procedure. What happens during the  procedure?   You will undress and remove any jewelry from your neck or chest.  You will put on a hospital gown.  Sticky electrodes will be placed on your chest. The electrodes will be connected to an electrocardiogram (ECG) machine to record a tracing of the electrical activity of your heart.  You will lie down on a curved bed  that is attached to the [place].  You may be given medicine to slow down your heart rate so that clear pictures can be created.  You will be moved into the CT scanner, and the CT scanner will take pictures of your heart. During this time, you will be asked to lie still and hold your breath for 2-3 seconds at a time while each picture of your heart is being taken. The procedure may vary among health care providers and hospitals. What happens after the procedure?  You can get dressed.  You can return to your normal activities.  It is up to you to get the results of your procedure. Ask your health care provider, or the department that is doing the procedure, when your results will be ready. Summary  A coronary calcium scan is an imaging test used to look for deposits of plaque in the inner lining of the blood vessels of the heart (coronary arteries). Plaque is made up of calcium, protein, and fatty substances.  Generally, this is a safe procedure. Tell your health care provider if you are pregnant or may be pregnant.  Ask your health care provider for any specific instructions on how to prepare for this procedure.  A CT scanner will take pictures of your heart.  You can return to your normal activities after the scan is done. This information is not intended to replace advice given to you by your health care provider. Make sure you discuss any questions you have with your health care provider. Document Revised: 12/18/2018 Document Reviewed: 12/18/2018 Elsevier Patient Education  [place].

## 2019-06-21 NOTE — Progress Notes (Signed)
06/21/2019 Mary Mcfarland   1956-02-01  [id number]  Primary Physician Lawerance Cruel, MD Primary Cardiologist: Lorretta Harp MD [name], [place]  HPI:  Mary Mcfarland is a 64 y.o.   morbidly overweight separated Caucasian female mother of 2 children, grandmother of 7 grandchildren referred by Dr. Harrington Challenger for cardiovascular valuation because of newly recognized atrial fibrillation.  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  Her risk factors include remote tobacco abuse having quit in 1997, treated hypertension and untreated hyperlipidemia.  Her father did die of a myocardial infarction at age 44.  She is never had a heart tach or stroke.  She denies chest pain but over the last 3 weeks has noticed some increasing dyspnea on exertion.  She is also noted some episodic dizziness.  She did see her PCP, Dr. Harrington Challenger, on 06/19/2019 which time she was in A. fib with a ventricular spots of 130.  She is placed on a beta-blocker for rate control on Eliquis oral anticoagulation ( The CHA2DSVASC2 score is   .2).    Current Meds  Medication Sig  . albuterol (PROVENTIL HFA;VENTOLIN HFA) 108 (90 Base) MCG/ACT inhaler Inhale 2 puffs into the lungs every 6 (six) hours as needed for wheezing or shortness of breath.  Marland Kitchen apixaban (ELIQUIS) 5 MG TABS tablet Take 5 mg by mouth 2 (two) times daily.  Marland Kitchen buPROPion (WELLBUTRIN XL) 300 MG 24 hr tablet Take 300 mg by mouth daily.  . divalproex (DEPAKOTE ER) 500 MG 24 hr tablet Take 2,000 mg by mouth at bedtime.   Marland Kitchen lisinopril-hydrochlorothiazide (PRINZIDE,ZESTORETIC) 20-25 MG tablet Take 1 tablet by mouth daily.  . methocarbamol (ROBAXIN) 500 MG tablet Take 1 tablet (500 mg total) by mouth every 6 (six) hours as needed for muscle spasms.  Marland Kitchen omeprazole (PRILOSEC) 20 MG capsule Take 20 mg by mouth daily.  Marland Kitchen oxyCODONE-acetaminophen (ROXICET) 5-325 MG tablet Take 1-2 tablets by mouth every 4 (four) hours as needed for severe pain.  . phenazopyridine  (PYRIDIUM) 200 MG tablet Take 1 tablet (200 mg total) by mouth 3 (three) times daily as needed for pain.  . promethazine (PHENERGAN) 12.5 MG tablet Take 1-2 tablets (12.5-25 mg total) by mouth every 6 (six) hours as needed for nausea.     Allergies  Allergen Reactions  . Niacin Hives    Social History   Socioeconomic History  . Marital status: Married    Spouse name: Not on file  . Number of children: Not on file  . Years of education: Not on file  . Highest education level: Not on file  Occupational History  . Not on file  Tobacco Use  . Smoking status: Former Smoker    Packs/day: 1.00    Types: Cigarettes    Quit date: 02/13/1996    Years since quitting: 23.3  . Smokeless tobacco: Never Used  Substance and Sexual Activity  . Alcohol use: Yes    Comment: rarely  . Drug use: Not on file  . Sexual activity: Not on file  Other Topics Concern  . Not on file  Social History Narrative  . Not on file   Social Determinants of Health   Financial Resource Strain:   . Difficulty of Paying Living Expenses: Not on file  Food Insecurity:   . Worried About Charity fundraiser in the Last Year: Not on file  . Ran Out of Food in the Last Year: Not on file  Transportation Needs:   . Film/video editor (Medical): Not on file  . Lack of Transportation (Non-Medical): Not on file  Physical Activity:   . Days of Exercise per Week: Not on file  . Minutes of Exercise per Session: Not on file  Stress:   . Feeling of Stress : Not on file  Social Connections:   . Frequency of Communication with Friends and Family: Not on file  . Frequency of Social Gatherings with Friends and Family: Not on file  . Attends Religious Services: Not on file  . Active Member of Clubs or Organizations: Not on file  . Attends Archivist Meetings: Not on file  . Marital Status: Not on file  Intimate Partner Violence:   . Fear of Current or Ex-Partner: Not on file  . Emotionally Abused: Not on  file  . Physically Abused: Not on file  . Sexually Abused: Not on file     Review of Systems: General: negative for chills, fever, night sweats or weight changes.  Cardiovascular: negative for chest pain, dyspnea on exertion, edema, orthopnea, palpitations, paroxysmal nocturnal dyspnea or shortness of breath Dermatological: negative for rash Respiratory: negative for cough or wheezing Urologic: negative for hematuria Abdominal: negative for nausea, vomiting, diarrhea, bright red blood per rectum, melena, or hematemesis Neurologic: negative for visual changes, syncope, or dizziness All other systems reviewed and are otherwise negative except as noted above.    Blood pressure 117/74, pulse 76, height 5\' 6"  (1.676 m), weight 263 lb (119.3 kg), last menstrual period 09/24/2013, SpO2 96 %.  General appearance: alert and no distress Neck: no adenopathy, no carotid bruit, no JVD, supple, symmetrical, trachea midline and thyroid not enlarged, symmetric, no tenderness/mass/nodules Lungs: clear to auscultation bilaterally Heart: irregularly irregular rhythm Extremities: extremities normal, atraumatic, no cyanosis or edema Pulses: 2+ and symmetric Skin: Skin color, texture, turgor normal. No rashes or lesions Neurologic: Alert and oriented X 3, normal strength and tone. Normal symmetric reflexes. Normal coordination and gait  EKG atrial fibrillation with a ventricular sponsor 76, right bundle branch block.  I personally reviewed this EKG.  ASSESSMENT AND PLAN:   Family history of heart disease Father died of a myocardial infarction at age 53  Atrial fibrillation [facility]) Remote history of transient A. fib worked up by Dr. Wynonia Lawman several years ago.  She started feeling somewhat weak and dyspneic with an dizzy approximately-3 weeks ago and was found to be in A. fib when she visited her PCPs office 06/19/2019.  He did place her on a beta-blocker as well as Eliquis oral anticoagulation.  I am going to  get a 2D echocardiogram, thyroid function tests and arrange for her to undergo elective outpatient DC cardioversion sometime in the next 4 to 6 weeks.  Essential hypertension History of essential potential blood pressure measured today 117/74.  She is on lisinopril and hydrochlorothiazide as well as a beta-blocker.  Hyperlipidemia History of hyperlipidemia not on statin therapy with lipid profile performed 02/09/2018 revealing total cholesterol 244, LDL 170 and HDL of 54.  I am going to repeat a fasting lipid profile      Lorretta Harp MD [facility], [facility] 06/21/2019 10:33 AM

## 2019-06-21 NOTE — Assessment & Plan Note (Signed)
Father died of a myocardial infarction at age 62

## 2019-06-24 ENCOUNTER — Telehealth: Payer: Self-pay | Admitting: Pharmacist

## 2019-06-24 ENCOUNTER — Telehealth: Payer: Self-pay | Admitting: Pharmacist Clinician (PhC)/ Clinical Pharmacy Specialist

## 2019-06-24 ENCOUNTER — Telehealth: Payer: Self-pay | Admitting: Cardiovascular Disease

## 2019-06-24 DIAGNOSIS — E782 Mixed hyperlipidemia: Secondary | ICD-10-CM | POA: Diagnosis not present

## 2019-06-24 DIAGNOSIS — I10 Essential (primary) hypertension: Secondary | ICD-10-CM | POA: Diagnosis not present

## 2019-06-24 DIAGNOSIS — I4891 Unspecified atrial fibrillation: Secondary | ICD-10-CM | POA: Diagnosis not present

## 2019-06-24 DIAGNOSIS — R072 Precordial pain: Secondary | ICD-10-CM | POA: Diagnosis not present

## 2019-06-24 LAB — HEPATIC FUNCTION PANEL
ALT: 20 IU/L (ref 0–32)
AST: 16 IU/L (ref 0–40)
Albumin: 4.3 g/dL (ref 3.8–4.8)
Alkaline Phosphatase: 72 IU/L (ref 39–117)
Bilirubin Total: 0.4 mg/dL (ref 0.0–1.2)
Bilirubin, Direct: 0.1 mg/dL (ref 0.00–0.40)
Total Protein: 6.8 g/dL (ref 6.0–8.5)

## 2019-06-24 LAB — TSH: TSH: 2.49 u[IU]/mL (ref 0.450–4.500)

## 2019-06-24 LAB — LIPID PANEL
Chol/HDL Ratio: 3.7 ratio (ref 0.0–4.4)
Cholesterol, Total: 209 mg/dL — ABNORMAL HIGH (ref 100–199)
HDL: 57 mg/dL (ref >39)
LDL Chol Calc (NIH): 131 mg/dL — ABNORMAL HIGH (ref 0–99)
Triglycerides: 116 mg/dL (ref 0–149)
VLDL Cholesterol Cal: 21 mg/dL (ref 5–40)

## 2019-06-24 NOTE — Telephone Encounter (Signed)
LMOM for patient to return call - pt is 28 and has Pharmacist, community.  She can use copay card.

## 2019-06-24 NOTE — Telephone Encounter (Signed)
Patient requesting co-pay card for Eliquis but unable to download from computer and will prefer hard copy from manufacturer.     [employer]liqu[employer] drug rep contacted to request more co-pay cards.

## 2019-06-24 NOTE — Telephone Encounter (Signed)
Called pt to let her know Dr Gwenlyn Found approved her to be out of work for a few days. Pt got upset and said she would have preferred a month. Wrote her letter to put her out of work until 07/02/2019 after her echo. Pt hung up on nurse.

## 2019-06-24 NOTE — Telephone Encounter (Signed)
Patient calling to request a note to be out of work for 2 weeks by Dr. Gwenlyn Found. She states she works for [employer] and it is very stressful.

## 2019-06-24 NOTE — Telephone Encounter (Signed)
OK to be out of work for a few days

## 2019-06-24 NOTE — Telephone Encounter (Signed)
Patient is requesting a letter from Dr Gwenlyn Found to be out of work for few days while getting "things" under control.   Please contact patient for additional information.Marland Kitchen

## 2019-06-24 NOTE — Telephone Encounter (Signed)
-----   Message from [place], [place] sent at 06/24/2019  8:56 AM EST -----  ----- Message ----- From: Cain Sieve, RN Sent: 06/21/2019  10:37 AM EST To: Cv Div Nl Anticoag  Can one of you please call this pt and talk to her about being able to afford her Eliquis please.

## 2019-06-24 NOTE — Telephone Encounter (Signed)
Called pt. Stated that she works at [employer] and it can get stressful. States she was just diagnosed with A. Fib and she is starting her new medication and getting her test done and seeing Dr Gwenlyn Found back in 1 month. She wants a letter to take her out of work because she "over-exerts" herself at times and wants to get things "under control". Will send to Dr Gwenlyn Found for review.

## 2019-06-24 NOTE — Telephone Encounter (Signed)
Follow up   Patient states that her additional symptoms are headache and dizziness per the previous note. Please advise.

## 2019-06-25 DIAGNOSIS — E782 Mixed hyperlipidemia: Secondary | ICD-10-CM

## 2019-06-25 DIAGNOSIS — I10 Essential (primary) hypertension: Secondary | ICD-10-CM

## 2019-06-28 ENCOUNTER — Ambulatory Visit: Payer: BLUE CROSS/BLUE SHIELD | Admitting: Cardiology

## 2019-07-01 ENCOUNTER — Other Ambulatory Visit: Payer: Self-pay | Admitting: Cardiovascular Disease

## 2019-07-01 ENCOUNTER — Telehealth: Payer: Self-pay

## 2019-07-01 ENCOUNTER — Other Ambulatory Visit: Payer: Self-pay

## 2019-07-01 ENCOUNTER — Ambulatory Visit (HOSPITAL_COMMUNITY): Payer: BLUE CROSS/BLUE SHIELD | Attending: Cardiovascular Disease

## 2019-07-01 ENCOUNTER — Ambulatory Visit (INDEPENDENT_AMBULATORY_CARE_PROVIDER_SITE_OTHER)
Admission: RE | Admit: 2019-07-01 | Discharge: 2019-07-01 | Disposition: A | Discharge status: Home / Self Care | Payer: Self-pay | Source: Ambulatory Visit | Attending: Cardiology | Admitting: Cardiology

## 2019-07-01 DIAGNOSIS — I10 Essential (primary) hypertension: Secondary | ICD-10-CM

## 2019-07-01 DIAGNOSIS — R072 Precordial pain: Secondary | ICD-10-CM | POA: Insufficient documentation

## 2019-07-01 DIAGNOSIS — Z8249 Family history of ischemic heart disease and other diseases of the circulatory system: Secondary | ICD-10-CM | POA: Insufficient documentation

## 2019-07-01 DIAGNOSIS — I4891 Unspecified atrial fibrillation: Secondary | ICD-10-CM | POA: Diagnosis not present

## 2019-07-01 DIAGNOSIS — E782 Mixed hyperlipidemia: Secondary | ICD-10-CM | POA: Insufficient documentation

## 2019-07-01 NOTE — Telephone Encounter (Signed)
lmomed the pt stated that the copay card will be left at the front desk for them and that the nurse will work on the letter for work asap

## 2019-07-03 ENCOUNTER — Telehealth: Payer: Self-pay

## 2019-07-03 DIAGNOSIS — E782 Mixed hyperlipidemia: Secondary | ICD-10-CM

## 2019-07-03 MED ORDER — ATORVASTATIN CALCIUM 40 MG PO TABS: 40.0000 mg | ORAL_TABLET | Freq: Every day | ORAL | 3 refills | Status: DC

## 2019-07-03 NOTE — Telephone Encounter (Signed)
-----   Message from Lorretta Harp, MD sent at 07/02/2019  2:09 PM EST ----- Coronary calcium score was 22 which is mildly elevated but suggest no evidence of CAD.  Given her LDL of 170 I would start atorvastatin 40 mg a day, co-Q10 200 mg a day recheck in 2 months

## 2019-07-08 DIAGNOSIS — M79672 Pain in left foot: Secondary | ICD-10-CM | POA: Diagnosis not present

## 2019-07-08 DIAGNOSIS — M79671 Pain in right foot: Secondary | ICD-10-CM | POA: Diagnosis not present

## 2019-07-19 ENCOUNTER — Ambulatory Visit: Payer: BLUE CROSS/BLUE SHIELD | Admitting: Cardiovascular Disease

## 2019-07-19 ENCOUNTER — Other Ambulatory Visit: Payer: Self-pay

## 2019-07-19 ENCOUNTER — Encounter: Payer: Self-pay | Admitting: Cardiovascular Disease

## 2019-07-19 DIAGNOSIS — E782 Mixed hyperlipidemia: Secondary | ICD-10-CM | POA: Diagnosis not present

## 2019-07-19 DIAGNOSIS — I10 Essential (primary) hypertension: Secondary | ICD-10-CM | POA: Diagnosis not present

## 2019-07-19 DIAGNOSIS — I4811 Longstanding persistent atrial fibrillation: Secondary | ICD-10-CM | POA: Diagnosis not present

## 2019-07-19 NOTE — Assessment & Plan Note (Signed)
History of essential hypertension blood pressure measured today 116/71.  She is on metoprolol, lisinopril and hydrochlorothiazide.

## 2019-07-19 NOTE — Assessment & Plan Note (Signed)
History of atrial fibrillation now rate controlled on beta-blocker and anticoagulated on Eliquis oral anticoagulation which she has taken twice daily without fail since I saw her last.  She feels clinically improved.  I am going to arrange for her to undergo outpatient DC cardioversion.  Her 2D echo showed normal LV systolic function with mildly dilated left atrium.

## 2019-07-19 NOTE — Assessment & Plan Note (Signed)
History of hyperlipidemia on statin drugs with recent lipid profile performed 06/24/2019 revealing total cholesterol 209, LDL of 131 and HDL 57.

## 2019-07-19 NOTE — Progress Notes (Signed)
07/19/2019 Mary Mcfarland   [birth year]  [id number]  Primary Physician Lawerance Cruel, MD Primary Cardiologist: Lorretta Harp MD [name], [place]  HPI:  Mary Mcfarland is a 64 y.o.  morbidly overweight separated Caucasian female mother of 2 children, grandmother of 7 grandchildren referred by Dr. Harrington Challenger for cardiovascular valuation because of newly recognized atrial fibrillation.  I last saw her in the office 06/21/2019. 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  Her risk factors include remote tobacco abuse having quit in 1997, treated hypertension and untreated hyperlipidemia.  Her father did die of a myocardial infarction at age 37.  She is never had a heart tach or stroke.  She denies chest pain but over the last 3 weeks has noticed some increasing dyspnea on exertion.  She is also noted some episodic dizziness.  She did see her PCP, Dr. Harrington Challenger, on 06/19/2019 which time she was in A. fib with a ventricular spots of 130.  She is placed on a beta-blocker for rate control on Eliquis oral anticoagulation ( The CHA2DSVASC2 score is   .2).   Since I saw her a month ago I did place her on Eliquis and metoprolol.  Her rate is much better controlled and she feels clinically improved.  2D echo revealed normal LV systolic function with moderate left atrial enlargement.  I am going to arrange for her to undergo outpatient elective DC cardioversion for restoration of sinus rhythm.  She is somewhat symptomatic from her atrial fibrillation.   Current Meds  Medication Sig  . albuterol (PROVENTIL HFA;VENTOLIN HFA) 108 (90 Base) MCG/ACT inhaler Inhale 2 puffs into the lungs every 6 (six) hours as needed for wheezing or shortness of breath.  Marland Kitchen apixaban (ELIQUIS) 5 MG TABS tablet Take 5 mg by mouth 2 (two) times daily.  Marland Kitchen atorvastatin (LIPITOR) 40 MG tablet Take 1 tablet (40 mg total) by mouth daily.  Marland Kitchen buPROPion (WELLBUTRIN XL) 300 MG 24 hr tablet Take 300 mg by mouth daily.  .  divalproex (DEPAKOTE ER) 500 MG 24 hr tablet Take 2,000 mg by mouth at bedtime.   Marland Kitchen lisinopril-hydrochlorothiazide (PRINZIDE,ZESTORETIC) 20-25 MG tablet Take 1 tablet by mouth daily.  . methocarbamol (ROBAXIN) 500 MG tablet Take 1 tablet (500 mg total) by mouth every 6 (six) hours as needed for muscle spasms.  Marland Kitchen omeprazole (PRILOSEC) 20 MG capsule Take 20 mg by mouth daily.  Marland Kitchen oxyCODONE-acetaminophen (ROXICET) 5-325 MG tablet Take 1-2 tablets by mouth every 4 (four) hours as needed for severe pain.  . promethazine (PHENERGAN) 12.5 MG tablet Take 1-2 tablets (12.5-25 mg total) by mouth every 6 (six) hours as needed for nausea.  . [DISCONTINUED] phenazopyridine (PYRIDIUM) 200 MG tablet Take 1 tablet (200 mg total) by mouth 3 (three) times daily as needed for pain.     Allergies  Allergen Reactions  . Niacin Hives    Social History   Socioeconomic History  . Marital status: Married    Spouse name: Not on file  . Number of children: Not on file  . Years of education: Not on file  . Highest education level: Not on file  Occupational History  . Not on file  Tobacco Use  . Smoking status: Former Smoker    Packs/day: 1.00    Types: Cigarettes    Quit date: 02/13/1996    Years since quitting: 23.4  . Smokeless tobacco: Never Used  Substance and Sexual Activity  . Alcohol use:  Yes    Comment: rarely  . Drug use: Not on file  . Sexual activity: Not on file  Other Topics Concern  . Not on file  Social History Narrative  . Not on file   Social Determinants of Health   Financial Resource Strain:   . Difficulty of Paying Living Expenses: Not on file  Food Insecurity:   . Worried About Charity fundraiser in the Last Year: Not on file  . Ran Out of Food in the Last Year: Not on file  Transportation Needs:   . Lack of Transportation (Medical): Not on file  . Lack of Transportation (Non-Medical): Not on file  Physical Activity:   . Days of Exercise per Week: Not on file  . Minutes of  Exercise per Session: Not on file  Stress:   . Feeling of Stress : Not on file  Social Connections:   . Frequency of Communication with Friends and Family: Not on file  . Frequency of Social Gatherings with Friends and Family: Not on file  . Attends Religious Services: Not on file  . Active Member of Clubs or Organizations: Not on file  . Attends Archivist Meetings: Not on file  . Marital Status: Not on file  Intimate Partner Violence:   . Fear of Current or Ex-Partner: Not on file  . Emotionally Abused: Not on file  . Physically Abused: Not on file  . Sexually Abused: Not on file     Review of Systems: General: negative for chills, fever, night sweats or weight changes.  Cardiovascular: negative for chest pain, dyspnea on exertion, edema, orthopnea, palpitations, paroxysmal nocturnal dyspnea or shortness of breath Dermatological: negative for rash Respiratory: negative for cough or wheezing Urologic: negative for hematuria Abdominal: negative for nausea, vomiting, diarrhea, bright red blood per rectum, melena, or hematemesis Neurologic: negative for visual changes, syncope, or dizziness All other systems reviewed and are otherwise negative except as noted above.    Blood pressure 116/71, pulse 84, temperature (!) 96.8 F (36 C), height 5\' 6"  (1.676 m), weight 264 lb 6.4 oz (119.9 kg), last menstrual period 09/24/2013, SpO2 97 %.  General appearance: alert and no distress Neck: no adenopathy, no carotid bruit, no JVD, supple, symmetrical, trachea midline and thyroid not enlarged, symmetric, no tenderness/mass/nodules Lungs: clear to auscultation bilaterally Heart: irregularly irregular rhythm Extremities: extremities normal, atraumatic, no cyanosis or edema Pulses: 2+ and symmetric Skin: Skin color, texture, turgor normal. No rashes or lesions Neurologic: Alert and oriented X 3, normal strength and tone. Normal symmetric reflexes. Normal coordination and gait  EKG  not performed today  ASSESSMENT AND PLAN:   Atrial fibrillation (HCC) History of atrial fibrillation now rate controlled on beta-blocker and anticoagulated on Eliquis oral anticoagulation which she has taken twice daily without fail since I saw her last.  She feels clinically improved.  I am going to arrange for her to undergo outpatient DC cardioversion.  Her 2D echo showed normal LV systolic function with mildly dilated left atrium.  Essential hypertension History of essential hypertension blood pressure measured today 116/71.  She is on metoprolol, lisinopril and hydrochlorothiazide.  Hyperlipidemia History of hyperlipidemia on statin drugs with recent lipid profile performed 06/24/2019 revealing total cholesterol 209, LDL of 131 and HDL 57.      Lorretta Harp MD [facility], [facility] 07/19/2019 12:04 PM

## 2019-07-19 NOTE — H&P (View-Only) (Signed)
07/19/2019 KITT COURSE   June 25, 1955  [id number]  Primary Physician Lawerance Cruel, MD Primary Cardiologist: Lorretta Harp MD [name], [place]  HPI:  Mary Mcfarland is a 64 y.o.  morbidly overweight separated Caucasian female mother of 2 children, grandmother of 7 grandchildren referred by Dr. Harrington Challenger for cardiovascular valuation because of newly recognized atrial fibrillation.  I last saw her in the office 06/21/2019. 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  Her risk factors include remote tobacco abuse having quit in 1997, treated hypertension and untreated hyperlipidemia.  Her father did die of a myocardial infarction at age 57.  She is never had a heart tach or stroke.  She denies chest pain but over the last 3 weeks has noticed some increasing dyspnea on exertion.  She is also noted some episodic dizziness.  She did see her PCP, Dr. Harrington Challenger, on 06/19/2019 which time she was in A. fib with a ventricular spots of 130.  She is placed on a beta-blocker for rate control on Eliquis oral anticoagulation ( The CHA2DSVASC2 score is   .2).   Since I saw her a month ago I did place her on Eliquis and metoprolol.  Her rate is much better controlled and she feels clinically improved.  2D echo revealed normal LV systolic function with moderate left atrial enlargement.  I am going to arrange for her to undergo outpatient elective DC cardioversion for restoration of sinus rhythm.  She is somewhat symptomatic from her atrial fibrillation.   Current Meds  Medication Sig  . albuterol (PROVENTIL HFA;VENTOLIN HFA) 108 (90 Base) MCG/ACT inhaler Inhale 2 puffs into the lungs every 6 (six) hours as needed for wheezing or shortness of breath.  Marland Kitchen apixaban (ELIQUIS) 5 MG TABS tablet Take 5 mg by mouth 2 (two) times daily.  Marland Kitchen atorvastatin (LIPITOR) 40 MG tablet Take 1 tablet (40 mg total) by mouth daily.  Marland Kitchen buPROPion (WELLBUTRIN XL) 300 MG 24 hr tablet Take 300 mg by mouth daily.  .  divalproex (DEPAKOTE ER) 500 MG 24 hr tablet Take 2,000 mg by mouth at bedtime.   Marland Kitchen lisinopril-hydrochlorothiazide (PRINZIDE,ZESTORETIC) 20-25 MG tablet Take 1 tablet by mouth daily.  . methocarbamol (ROBAXIN) 500 MG tablet Take 1 tablet (500 mg total) by mouth every 6 (six) hours as needed for muscle spasms.  Marland Kitchen omeprazole (PRILOSEC) 20 MG capsule Take 20 mg by mouth daily.  Marland Kitchen oxyCODONE-acetaminophen (ROXICET) 5-325 MG tablet Take 1-2 tablets by mouth every 4 (four) hours as needed for severe pain.  . promethazine (PHENERGAN) 12.5 MG tablet Take 1-2 tablets (12.5-25 mg total) by mouth every 6 (six) hours as needed for nausea.  . [DISCONTINUED] phenazopyridine (PYRIDIUM) 200 MG tablet Take 1 tablet (200 mg total) by mouth 3 (three) times daily as needed for pain.     Allergies  Allergen Reactions  . Niacin Hives    Social History   Socioeconomic History  . Marital status: Married    Spouse name: Not on file  . Number of children: Not on file  . Years of education: Not on file  . Highest education level: Not on file  Occupational History  . Not on file  Tobacco Use  . Smoking status: Former Smoker    Packs/day: 1.00    Types: Cigarettes    Quit date: 02/13/1996    Years since quitting: 23.4  . Smokeless tobacco: Never Used  Substance and Sexual Activity  . Alcohol use:  Yes    Comment: rarely  . Drug use: Not on file  . Sexual activity: Not on file  Other Topics Concern  . Not on file  Social History Narrative  . Not on file   Social Determinants of Health   Financial Resource Strain:   . Difficulty of Paying Living Expenses: Not on file  Food Insecurity:   . Worried About Charity fundraiser in the Last Year: Not on file  . Ran Out of Food in the Last Year: Not on file  Transportation Needs:   . Lack of Transportation (Medical): Not on file  . Lack of Transportation (Non-Medical): Not on file  Physical Activity:   . Days of Exercise per Week: Not on file  . Minutes of  Exercise per Session: Not on file  Stress:   . Feeling of Stress : Not on file  Social Connections:   . Frequency of Communication with Friends and Family: Not on file  . Frequency of Social Gatherings with Friends and Family: Not on file  . Attends Religious Services: Not on file  . Active Member of Clubs or Organizations: Not on file  . Attends Archivist Meetings: Not on file  . Marital Status: Not on file  Intimate Partner Violence:   . Fear of Current or Ex-Partner: Not on file  . Emotionally Abused: Not on file  . Physically Abused: Not on file  . Sexually Abused: Not on file     Review of Systems: General: negative for chills, fever, night sweats or weight changes.  Cardiovascular: negative for chest pain, dyspnea on exertion, edema, orthopnea, palpitations, paroxysmal nocturnal dyspnea or shortness of breath Dermatological: negative for rash Respiratory: negative for cough or wheezing Urologic: negative for hematuria Abdominal: negative for nausea, vomiting, diarrhea, bright red blood per rectum, melena, or hematemesis Neurologic: negative for visual changes, syncope, or dizziness All other systems reviewed and are otherwise negative except as noted above.    Blood pressure 116/71, pulse 84, temperature (!) 96.8 F (36 C), height 5\' 6"  (1.676 m), weight 264 lb 6.4 oz (119.9 kg), last menstrual period 09/24/2013, SpO2 97 %.  General appearance: alert and no distress Neck: no adenopathy, no carotid bruit, no JVD, supple, symmetrical, trachea midline and thyroid not enlarged, symmetric, no tenderness/mass/nodules Lungs: clear to auscultation bilaterally Heart: irregularly irregular rhythm Extremities: extremities normal, atraumatic, no cyanosis or edema Pulses: 2+ and symmetric Skin: Skin color, texture, turgor normal. No rashes or lesions Neurologic: Alert and oriented X 3, normal strength and tone. Normal symmetric reflexes. Normal coordination and gait  EKG  not performed today  ASSESSMENT AND PLAN:   Atrial fibrillation (HCC) History of atrial fibrillation now rate controlled on beta-blocker and anticoagulated on Eliquis oral anticoagulation which she has taken twice daily without fail since I saw her last.  She feels clinically improved.  I am going to arrange for her to undergo outpatient DC cardioversion.  Her 2D echo showed normal LV systolic function with mildly dilated left atrium.  Essential hypertension History of essential hypertension blood pressure measured today 116/71.  She is on metoprolol, lisinopril and hydrochlorothiazide.  Hyperlipidemia History of hyperlipidemia on statin drugs with recent lipid profile performed 06/24/2019 revealing total cholesterol 209, LDL of 131 and HDL 57.      Lorretta Harp MD [facility], [facility] 07/19/2019 12:04 PM

## 2019-07-19 NOTE — Patient Instructions (Addendum)
Medication Instructions:  If you need a refill on your cardiac medications before your next appointment, please call your pharmacy.   Lab work: BMET, CBC If you have labs (blood work) drawn today and your tests are completely normal, you will receive your results only by: [place] (if you have MyChart) OR A paper copy in the mail If you have any lab test that is abnormal or we need to change your treatment, we will call you to review the results.  Testing/Procedures: Cardioversion 2/10 @ 10:30am  Follow-Up: At [facility], you and your health needs are our priority.  As part of our continuing mission to provide you with exceptional heart care, we have created designated Provider Care Teams.  These Care Teams include your primary Cardiologist (physician) and Advanced Practice Providers (APPs -  Physician Assistants and Nurse Practitioners) who all work together to provide you with the care you need, when you need it. You may see Dr. Gwenlyn Found or one of the following Advanced Practice Providers on your designated Care Team:    Kerin Ransom, PA-C  [place], [place]  Coletta Memos, [place]  Your physician wants you to follow-up in: 4-6 weeks with Dr Gwenlyn Found    Any Other Special Instructions Will Be Listed Below (If Applicable).   You are scheduled for a Cardioversion on February 10th at 10:30 with Dr. Harrell Gave.  Please arrive at the [facility] (Main Entrance A) at [facility]: [address] [place], [place] [address] at 10:30 am. (1 hour prior to procedure unless lab work is needed; if lab work is needed arrive 1.5 hours ahead)  DIET: Nothing to eat or drink after midnight except a sip of water with medications (see medication instructions below)  Medication Instructions:  Continue your anticoagulant: Eliquis You will need to continue your anticoagulant after your procedure until you  are told by your  Provider that it is safe to stop   Labs: Drawn today  You  must have a responsible person to drive you home and stay in the waiting area during your procedure. Failure to do so could result in cancellation.  Bring your insurance cards.  *Special Note: Every effort is made to have your procedure done on time. Occasionally there are emergencies that occur at the hospital that may cause delays. Please be patient if a delay does occur.   You have to be tested for Covid. This is scheduled for Monday, Frebruary 8th @ 2:15pm.    LAB This is a Drive Up Visit at the [facility] [address], [place]. Someone will direct you to the appropriate testing line. Stay in your car and someone will be with you shortly.

## 2019-07-20 LAB — CBC
Hematocrit: 42.6 % (ref 34.0–46.6)
Hemoglobin: 14.2 g/dL (ref 11.1–15.9)
MCH: 28.9 pg (ref 26.6–33.0)
MCHC: 33.3 g/dL (ref 31.5–35.7)
MCV: 87 fL (ref 79–97)
Platelets: 301 10*3/uL (ref 150–450)
RBC: 4.92 x10E6/uL (ref 3.77–5.28)
RDW: 12.6 % (ref 11.7–15.4)
WBC: 10.5 10*3/uL (ref 3.4–10.8)

## 2019-07-20 LAB — BASIC METABOLIC PANEL
BUN/Creatinine Ratio: 21 (ref 12–28)
BUN: 20 mg/dL (ref 8–27)
CO2: 24 mmol/L (ref 20–29)
Calcium: 9.8 mg/dL (ref 8.7–10.3)
Chloride: 104 mmol/L (ref 96–106)
Creatinine, Ser: 0.96 mg/dL (ref 0.57–1.00)
GFR calc Af Amer: 74 mL/min/{1.73_m2} (ref >59)
GFR calc non Af Amer: 64 mL/min/{1.73_m2} (ref >59)
Glucose: 76 mg/dL (ref 65–99)
Potassium: 4.8 mmol/L (ref 3.5–5.2)
Sodium: 143 mmol/L (ref 134–144)

## 2019-07-22 ENCOUNTER — Telehealth: Payer: Self-pay | Admitting: Cardiovascular Disease

## 2019-07-22 ENCOUNTER — Other Ambulatory Visit (HOSPITAL_COMMUNITY)
Admission: RE | Admit: 2019-07-22 | Discharge: 2019-07-22 | Disposition: A | Discharge status: Home / Self Care | Payer: BLUE CROSS/BLUE SHIELD | Source: Ambulatory Visit | Attending: Cardiology | Admitting: Cardiology

## 2019-07-22 DIAGNOSIS — Z01812 Encounter for preprocedural laboratory examination: Secondary | ICD-10-CM | POA: Diagnosis not present

## 2019-07-22 DIAGNOSIS — Z20822 Contact with and (suspected) exposure to covid-19: Secondary | ICD-10-CM | POA: Insufficient documentation

## 2019-07-22 LAB — SARS CORONAVIRUS 2 (TAT 6-24 HRS): SARS Coronavirus 2: NEGATIVE

## 2019-07-22 NOTE — Telephone Encounter (Signed)
LMTCB

## 2019-07-22 NOTE — Telephone Encounter (Signed)
Pt had an appt with DR. Berry on Friday and was told to call and confirm the dosage of one of her medications. Pt was doing just that. Please call back to discuss dosages

## 2019-07-23 NOTE — Progress Notes (Signed)
Pre- op call for endo procedure attempted,  Forwarded to VM. No message left.

## 2019-07-23 NOTE — Telephone Encounter (Signed)
Following up to patients call from yesterday regarding her medication dosages.-no answer-LMTCB

## 2019-07-24 ENCOUNTER — Encounter (HOSPITAL_COMMUNITY)
Admission: RE | Disposition: A | Discharge status: Home / Self Care | Payer: Self-pay | Source: Home / Self Care | Attending: Cardiology

## 2019-07-24 ENCOUNTER — Ambulatory Visit (HOSPITAL_COMMUNITY): Payer: BLUE CROSS/BLUE SHIELD | Admitting: Certified Registered Nurse Anesthetist

## 2019-07-24 ENCOUNTER — Encounter (HOSPITAL_COMMUNITY): Payer: Self-pay | Admitting: Cardiology

## 2019-07-24 ENCOUNTER — Ambulatory Visit (HOSPITAL_COMMUNITY)
Admission: RE | Admit: 2019-07-24 | Discharge: 2019-07-24 | Disposition: A | Discharge status: Home / Self Care | Payer: BLUE CROSS/BLUE SHIELD | Attending: Cardiology | Admitting: Cardiology

## 2019-07-24 ENCOUNTER — Other Ambulatory Visit: Payer: Self-pay

## 2019-07-24 DIAGNOSIS — I119 Hypertensive heart disease without heart failure: Secondary | ICD-10-CM | POA: Insufficient documentation

## 2019-07-24 DIAGNOSIS — I10 Essential (primary) hypertension: Secondary | ICD-10-CM | POA: Diagnosis not present

## 2019-07-24 DIAGNOSIS — Z8249 Family history of ischemic heart disease and other diseases of the circulatory system: Secondary | ICD-10-CM | POA: Diagnosis not present

## 2019-07-24 DIAGNOSIS — E785 Hyperlipidemia, unspecified: Secondary | ICD-10-CM | POA: Diagnosis not present

## 2019-07-24 DIAGNOSIS — J45909 Unspecified asthma, uncomplicated: Secondary | ICD-10-CM | POA: Diagnosis not present

## 2019-07-24 DIAGNOSIS — Z87891 Personal history of nicotine dependence: Secondary | ICD-10-CM | POA: Insufficient documentation

## 2019-07-24 DIAGNOSIS — I4819 Other persistent atrial fibrillation: Secondary | ICD-10-CM | POA: Diagnosis not present

## 2019-07-24 DIAGNOSIS — I4891 Unspecified atrial fibrillation: Secondary | ICD-10-CM | POA: Insufficient documentation

## 2019-07-24 DIAGNOSIS — Z888 Allergy status to other drugs, medicaments and biological substances status: Secondary | ICD-10-CM | POA: Insufficient documentation

## 2019-07-24 DIAGNOSIS — Z7901 Long term (current) use of anticoagulants: Secondary | ICD-10-CM | POA: Diagnosis not present

## 2019-07-24 DIAGNOSIS — Z79899 Other long term (current) drug therapy: Secondary | ICD-10-CM | POA: Insufficient documentation

## 2019-07-24 HISTORY — PX: CARDIOVERSION: SHX1299

## 2019-07-24 SURGERY — CARDIOVERSION
Anesthesia: General

## 2019-07-24 MED ORDER — PROPOFOL 10 MG/ML IV BOLUS
INTRAVENOUS | Status: DC | PRN
  Administered 2019-07-24: 50 mg via INTRAVENOUS
  Administered 2019-07-24: 30 mg via INTRAVENOUS
  Administered 2019-07-24: 20 mg via INTRAVENOUS

## 2019-07-24 MED ORDER — SODIUM CHLORIDE 0.9 % IV SOLN: INTRAVENOUS | Status: DC | PRN

## 2019-07-24 MED ORDER — LIDOCAINE 2% (20 MG/ML) 5 ML SYRINGE
INTRAMUSCULAR | Status: DC | PRN
  Administered 2019-07-24: 100 mg via INTRAVENOUS

## 2019-07-24 NOTE — Anesthesia Preprocedure Evaluation (Addendum)
Anesthesia Evaluation  Patient identified by MRN, date of birth, ID band Patient awake    Reviewed: Allergy & Precautions, NPO status , Patient's Chart, lab work & pertinent test results  History of Anesthesia Complications (+) DIFFICULT AIRWAY and history of anesthetic complications  Airway Mallampati: II  TM Distance: >3 FB Neck ROM: Full    Dental   Pulmonary asthma , sleep apnea , former smoker,    Pulmonary exam normal        Cardiovascular hypertension, + dysrhythmias Atrial Fibrillation  Rhythm:Irregular Rate:Normal     Neuro/Psych PSYCHIATRIC DISORDERS Bipolar Disorder negative neurological ROS     GI/Hepatic negative GI ROS, Neg liver ROS,   Endo/Other  Morbid obesity  Renal/GU negative Renal ROS  negative genitourinary   Musculoskeletal negative musculoskeletal ROS (+)   Abdominal   Peds  Hematology negative hematology ROS (+)   Anesthesia Other Findings   Reproductive/Obstetrics                            Anesthesia Physical Anesthesia Plan  ASA: III  Anesthesia Plan: General   Post-op Pain Management:    Induction: Intravenous  PONV Risk Score and Plan: TIVA and Treatment may vary due to age or medical condition  Airway Management Planned: Mask  Additional Equipment: None  Intra-op Plan:   Post-operative Plan:   Informed Consent: I have reviewed the patients History and Physical, chart, labs and discussed the procedure including the risks, benefits and alternatives for the proposed anesthesia with the patient or authorized representative who has indicated his/her understanding and acceptance.       Plan Discussed with:   Anesthesia Plan Comments:         Anesthesia Quick Evaluation

## 2019-07-24 NOTE — Anesthesia Postprocedure Evaluation (Signed)
Anesthesia Post Note  Patient: Mary Mcfarland  Procedure(s) Performed: CARDIOVERSION (N/A )     Patient location during evaluation: Endoscopy Anesthesia Type: General Level of consciousness: awake and alert Pain management: pain level controlled Vital Signs Assessment: post-procedure vital signs reviewed and stable Respiratory status: spontaneous breathing, nonlabored ventilation and respiratory function stable Cardiovascular status: blood pressure returned to baseline and stable Postop Assessment: no apparent nausea or vomiting Anesthetic complications: no    Last Vitals:  Vitals:   07/24/19 1130 07/24/19 1135  BP: (!) 85/63 (!) 93/54  Pulse: 70 83  Resp: 19 17  Temp:    SpO2: 96% 96%    Last Pain:  Vitals:   07/24/19 1120  TempSrc: Tympanic  PainSc: 0-No pain                 Lidia Collum

## 2019-07-24 NOTE — CV Procedure (Signed)
Procedure:   DCCV  Indication:  Symptomatic atrial fibrillation  Procedure Note:  The patient signed informed consent.  They have had had therapeutic anticoagulation with apixaban greater than 3 weeks.  Anesthesia was administered by Dr. Christella Hartigan.  Patient received 100 mg propofol and 100 mg lidocaine for the procedure. Adequate airway was maintained throughout and vital followed per protocol.  They were cardioverted x 3 with 120, 150, 200 J of biphasic synchronized energy.  They did not convert and remained in atrial fibrillation.  There were no apparent complications.  The patient had normal neuro status and respiratory status post procedure with vitals stable as recorded elsewhere.    Follow up:  They will continue on current medical therapy and follow up with cardiology as scheduled to discuss alternative strategies.  Buford Dresser, MD PhD 07/24/2019 11:20 AM

## 2019-07-24 NOTE — Discharge Instructions (Signed)
Electrical Cardioversion   What can I expect after the procedure?  Your blood pressure, heart rate, breathing rate, and blood oxygen level will be monitored until you leave the hospital or clinic.  Your heart rhythm will be watched to make sure it does not change.  You may have some redness on the skin where the shocks were given. Follow these instructions at home:  Do not drive for 24 hours if you were given a sedative during your procedure.  Take over-the-counter and prescription medicines only as told by your health care provider.  Ask your health care provider how to check your pulse. Check it often.  Rest for 48 hours after the procedure or as told by your health care provider.  Avoid or limit your caffeine use as told by your health care provider.  Keep all follow-up visits as told by your health care provider. This is important. Contact a health care provider if:  You feel like your heart is beating too quickly or your pulse is not regular.  You have a serious muscle cramp that does not go away. Get help right away if:  You have discomfort in your chest.  You are dizzy or you feel faint.  You have trouble breathing or you are short of breath.  Your speech is slurred.  You have trouble moving an arm or leg on one side of your body.  Your fingers or toes turn cold or blue. Summary  Electrical cardioversion is the delivery of a jolt of electricity to restore a normal rhythm to the heart.  This procedure may be done right away in an emergency or may be a scheduled procedure if the condition is not an emergency.  Generally, this is a safe procedure.  After the procedure, check your pulse often as told by your health care provider. This information is not intended to replace advice given to you by your health care provider. Make sure you discuss any questions you have with your health care provider. Document Revised: 12/31/2018 Document Reviewed:  12/31/2018 Elsevier Patient Education  2020 Elsevier Inc.  

## 2019-07-24 NOTE — Interval H&P Note (Signed)
History and Physical Interval Note:  07/24/2019 11:19 AM  Mary Mcfarland  has presented today for surgery, with the diagnosis of AFIB.  The various methods of treatment have been discussed with the patient and family. After consideration of risks, benefits and other options for treatment, the patient has consented to  Procedure(s): CARDIOVERSION (N/A) as a surgical intervention.  The patient's history has been reviewed, patient examined, no change in status, stable for surgery.  I have reviewed the patient's chart and labs.  Questions were answered to the patient's satisfaction.     Arian Mcquitty Harrell Gave

## 2019-07-24 NOTE — Transfer of Care (Signed)
Immediate Anesthesia Transfer of Care Note  Patient: Mary Mcfarland  Procedure(s) Performed: CARDIOVERSION (N/A )  Patient Location: Endoscopy Unit  Anesthesia Type:General  Level of Consciousness: drowsy and patient cooperative  Airway & Oxygen Therapy: Patient Spontanous Breathing  Post-op Assessment: Report given to RN, Post -op Vital signs reviewed and stable and Patient moving all extremities X 4  Post vital signs: Reviewed and stable  Last Vitals:  Vitals Value Taken Time  BP    Temp    Pulse    Resp    SpO2      Last Pain:  Vitals:   07/24/19 1104  TempSrc:   PainSc: 0-No pain         Complications: No apparent anesthesia complications

## 2019-07-26 ENCOUNTER — Telehealth: Payer: Self-pay | Admitting: Cardiovascular Disease

## 2019-07-26 NOTE — Telephone Encounter (Signed)
Follow Up:  Pt said she had a Cardioversion on Wednesday(07-24-19) and it did not work. Pt wants to know what is next please.,

## 2019-07-26 NOTE — Telephone Encounter (Signed)
Spoke with patient. She wanted to know if Dr. Gwenlyn Found wanted to see her sooner than her March appointment. Per DC instructions they advised she follow up as planned with Dr. Gwenlyn Found.  Patient is not having any changes in her symptoms, no worsening. She just wanted to follow. Will route to Dr. Gwenlyn Found and Lonn [place] C. to determine if sooner appointment is needed.

## 2019-07-28 NOTE — Telephone Encounter (Signed)
Needs ROV with me 1-2 weeks

## 2019-07-29 NOTE — Telephone Encounter (Signed)
Left message for patient to call back  

## 2019-07-29 NOTE — Telephone Encounter (Signed)
Patient schedule for 08/06/19 with Dr. Gwenlyn Found.

## 2019-08-06 ENCOUNTER — Ambulatory Visit: Payer: BLUE CROSS/BLUE SHIELD | Admitting: Cardiovascular Disease

## 2019-08-06 ENCOUNTER — Other Ambulatory Visit: Payer: Self-pay

## 2019-08-06 ENCOUNTER — Encounter: Payer: Self-pay | Admitting: Cardiovascular Disease

## 2019-08-06 VITALS — BP 106/62 | HR 109 | Temp 98.1°F | Ht 66.0 in | Wt 263.0 lb

## 2019-08-06 DIAGNOSIS — I10 Essential (primary) hypertension: Secondary | ICD-10-CM | POA: Diagnosis not present

## 2019-08-06 DIAGNOSIS — E782 Mixed hyperlipidemia: Secondary | ICD-10-CM

## 2019-08-06 DIAGNOSIS — I4891 Unspecified atrial fibrillation: Secondary | ICD-10-CM

## 2019-08-06 DIAGNOSIS — I4811 Longstanding persistent atrial fibrillation: Secondary | ICD-10-CM | POA: Diagnosis not present

## 2019-08-06 NOTE — Assessment & Plan Note (Signed)
History of hyperlipidemia on atorvastatin with lipid profile performed 06/24/2019 revealing total cholesterol 209, LDL of 131 and HDL 57.

## 2019-08-06 NOTE — Progress Notes (Signed)
08/06/2019 Mary Mcfarland   04-05-1956  [id number]  Primary Physician Mary Cruel, MD Primary Cardiologist: Mary Harp MD Mary Mcfarland, [place]  HPI:  Mary Mcfarland is a 64 y.o.  morbidly overweight separated Caucasian female mother of 2 children, grandmother of 7 grandchildren referred by Mary Mcfarland for cardiovascular valuation because of newly recognized atrial fibrillation.  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 Her risk factors include remote tobacco abuse having quit in 1997, treated hypertension and untreated hyperlipidemia. Her father did die of a myocardial infarction at age 54. She is never had a heart tach or stroke. She denies chest pain but over the last 3 weeks has noticed some increasing dyspnea on exertion. She is also noted some episodic dizziness. She did see her PCP, Mary Mcfarland, on 06/19/2019 which time she was in A. fib with a ventricular spots of 130. She is placed on a beta-blocker for rate control on Eliquis oral anticoagulation (The CHA2DSVASC2 score is .2).  I placed her on Eliquis and metoprolol.  Her rate is much better controlled and she feels clinically improved.  2D echo revealed normal LV systolic function with moderate left atrial enlargement.  Arrange for her to undergo outpatient cardioversion by Mary Mcfarland 07/24/2019.  She had 3 shocks and was unsuccessful in conversion.  She remains in atrial fibrillation rate controlled on metoprolol as well as on Eliquis oral anticoagulation.  She is somewhat symptomatic.  We will refer her to EP for further evaluation and consideration of A. fib ablation.  Current Meds  Medication Sig  . albuterol (PROVENTIL HFA;VENTOLIN HFA) 108 (90 Base) MCG/ACT inhaler Inhale 2 puffs into the lungs every 6 (six) hours as needed for wheezing or shortness of breath.  Marland Kitchen apixaban (ELIQUIS) 5 MG TABS tablet Take 5 mg by mouth 2 (two) times daily.  Marland Kitchen  atorvastatin (LIPITOR) 40 MG tablet Take 1 tablet (40 mg total) by mouth daily.  Marland Kitchen buPROPion (WELLBUTRIN SR) 150 MG 12 hr tablet Take 300 mg by mouth daily.  . Coenzyme Q10 (COQ10) 100 MG CAPS Take 100 mg by mouth daily.  Marland Kitchen ibuprofen (ADVIL) 800 MG tablet Take 800 mg by mouth at bedtime as needed (pain.).  Marland Kitchen lisinopril-hydrochlorothiazide (PRINZIDE,ZESTORETIC) 20-25 MG tablet Take 1 tablet by mouth daily.  . metoprolol tartrate (LOPRESSOR) 25 MG tablet Take 25 mg by mouth 2 (two) times daily.  . Naphazoline-Pheniramine (OPCON-A) 0.027-0.315 % SOLN Place 1-2 drops into both eyes 3 (three) times daily as needed (allergy eyes.).  Marland Kitchen omeprazole (PRILOSEC) 20 MG capsule Take 20 mg by mouth daily.     Allergies  Allergen Reactions  . Niacin Hives    Social History   Socioeconomic History  . Marital status: Married    Spouse name: Not on file  . Number of children: Not on file  . Years of education: Not on file  . Highest education level: Not on file  Occupational History  . Not on file  Tobacco Use  . Smoking status: Former Smoker    Packs/day: 1.00    Types: Cigarettes    Quit date: 02/13/1996    Years since quitting: 23.4  . Smokeless tobacco: Never Used  Substance and Sexual Activity  . Alcohol use: Yes    Comment: rarely  . Drug use: Not on file  . Sexual activity: Not on file  Other Topics Concern  . Not on file  Social History Narrative  . Not on file   Social Determinants of Health   Financial Resource Strain:   . Difficulty of Paying Living Expenses: Not on file  Food Insecurity:   . Worried About Charity fundraiser in the Last Year: Not on file  . Ran Out of Food in the Last Year: Not on file  Transportation Needs:   . Lack of Transportation (Medical): Not on file  . Lack of Transportation (Non-Medical): Not on file  Physical Activity:   . Days of Exercise per Week: Not on file  . Minutes of Exercise per Session: Not on file  Stress:   . Feeling of Stress :  Not on file  Social Connections:   . Frequency of Communication with Friends and Family: Not on file  . Frequency of Social Gatherings with Friends and Family: Not on file  . Attends Religious Services: Not on file  . Active Member of Clubs or Organizations: Not on file  . Attends Archivist Meetings: Not on file  . Marital Status: Not on file  Intimate Partner Violence:   . Fear of Current or Ex-Partner: Not on file  . Emotionally Abused: Not on file  . Physically Abused: Not on file  . Sexually Abused: Not on file     Review of Systems: General: negative for chills, fever, night sweats or weight changes.  Cardiovascular: negative for chest pain, dyspnea on exertion, edema, orthopnea, palpitations, paroxysmal nocturnal dyspnea or shortness of breath Dermatological: negative for rash Respiratory: negative for cough or wheezing Urologic: negative for hematuria Abdominal: negative for nausea, vomiting, diarrhea, bright red blood per rectum, melena, or hematemesis Neurologic: negative for visual changes, syncope, or dizziness All other systems reviewed and are otherwise negative except as noted above.    Blood pressure 106/62, pulse (!) 109, temperature 98.1 F (36.7 C), height 5\' 6"  (1.676 m), weight 263 lb (119.3 kg), last menstrual period 09/24/2013.  General appearance: alert and no distress Neck: no adenopathy, no carotid bruit, no JVD, supple, symmetrical, trachea midline and thyroid not enlarged, symmetric, no tenderness/mass/nodules Lungs: clear to auscultation bilaterally Heart: irregularly irregular rhythm Extremities: extremities normal, atraumatic, no cyanosis or edema Pulses: 2+ and symmetric Skin: Skin color, texture, turgor normal. No rashes or lesions Neurologic: Alert and oriented X 3, normal strength and tone. Normal symmetric reflexes. Normal coordination and gait  EKG atrial fibrillation with a ventricular sponsor of 109, right bundle branch block, low  limb voltage.  I personally reviewed this EKG.  ASSESSMENT AND PLAN:   Atrial fibrillation ([place]) Mary Mcfarland returns today after undergoing unsuccessful outpatient DC cardioversion for 07/24/2019.  She had 3 shocks and did not convert.  She remains on Eliquis oral anticoagulation as well as metoprolol.  Her heart rates are in the 90s.  She does feel somewhat fatigued and gets dyspnea.  I am going refer her to Dr. Rayann Heman for consideration of A. fib ablation.  Essential hypertension History of essential hypertension with blood pressure measured today at 106/62.  She is on metoprolol, lisinopril and hydrochlorothiazide.  Hyperlipidemia History of hyperlipidemia on atorvastatin with lipid profile performed 06/24/2019 revealing total cholesterol 209, LDL of 131 and HDL 57.      Mary Harp MD [facility], [facility] 08/06/2019 12:22 PM

## 2019-08-06 NOTE — Assessment & Plan Note (Signed)
Mary Mcfarland returns today after undergoing unsuccessful outpatient DC cardioversion for 07/24/2019.  She had 3 shocks and did not convert.  She remains on Eliquis oral anticoagulation as well as metoprolol.  Her heart rates are in the 90s.  She does feel somewhat fatigued and gets dyspnea.  I am going refer her to Dr. Rayann Heman for consideration of A. fib ablation.

## 2019-08-06 NOTE — Assessment & Plan Note (Signed)
History of essential hypertension with blood pressure measured today at 106/62.  She is on metoprolol, lisinopril and hydrochlorothiazide.

## 2019-08-06 NOTE — Patient Instructions (Signed)
Medication Instructions:  Your physician recommends that you continue on your current medications as directed. Please refer to the Current Medication list given to you today.  If you need a refill on your cardiac medications before your next appointment, please call your pharmacy.   Lab work: NONE  Testing/Procedures: NONE  Follow-Up: At [facility], you and your health needs are our priority.  As part of our continuing mission to provide you with exceptional heart care, we have created designated Provider Care Teams.  These Care Teams include your primary Cardiologist (physician) and Advanced Practice Providers (APPs -  Physician Assistants and Nurse Practitioners) who all work together to provide you with the care you need, when you need it. You may see Dr. Gwenlyn Found or one of the following Advanced Practice Providers on your designated Care Team:    Kerin Ransom, PA-C  [place], [place]  Coletta Memos, [place]  Your physician wants you to follow-up in: 3 months with Dr. Gwenlyn Found  Any Other Special Instructions Will Be Listed Below (If Applicable). You have been referred to Dr. Rayann Heman with Cardiac Electrophysiology. They will be in contact with you.

## 2019-08-09 ENCOUNTER — Telehealth (INDEPENDENT_AMBULATORY_CARE_PROVIDER_SITE_OTHER): Payer: BLUE CROSS/BLUE SHIELD | Admitting: Internal Medicine

## 2019-08-09 ENCOUNTER — Encounter: Payer: Self-pay | Admitting: Internal Medicine

## 2019-08-09 ENCOUNTER — Telehealth: Payer: Self-pay

## 2019-08-09 VITALS — Ht 66.0 in | Wt 260.0 lb

## 2019-08-09 DIAGNOSIS — I4819 Other persistent atrial fibrillation: Secondary | ICD-10-CM | POA: Diagnosis not present

## 2019-08-09 DIAGNOSIS — R0683 Snoring: Secondary | ICD-10-CM

## 2019-08-09 DIAGNOSIS — I10 Essential (primary) hypertension: Secondary | ICD-10-CM | POA: Diagnosis not present

## 2019-08-09 DIAGNOSIS — Z6841 Body Mass Index (BMI) 40.0 and over, adult: Secondary | ICD-10-CM

## 2019-08-09 DIAGNOSIS — Z8249 Family history of ischemic heart disease and other diseases of the circulatory system: Secondary | ICD-10-CM

## 2019-08-09 NOTE — Telephone Encounter (Signed)
Pt agrees to check RE: her RX drug coverage for planning after her Dofetilide loading.

## 2019-08-09 NOTE — Telephone Encounter (Signed)
LM for pt to call back to go over her Tikosyn loading and to contact her insurance re: her maintenance costs.   ** sleep study ordered in Epic and sent to Gers[facility] Cull.

## 2019-08-09 NOTE — Progress Notes (Signed)
Electrophysiology TeleHealth Note   Due to national recommendations of social distancing due to [place] 19, Audio/video telehealth visit is felt to be most appropriate for this patient at this time.  See MyChart message from today for patient consent regarding telehealth for [facility].   Date:  08/09/2019   ID:  Mary Mcfarland, DOB 12/12/1955, MRN [id number]  Location: home  Provider location: [address], [place] Evaluation Performed: New patient consult  PCP:  Mary Cruel, MD  Cardiologist:  Gwenlyn Found Electrophysiologist:  Jamel Holzmann   Chief Complaint:  New consult for atrial fibrillation  History of Present Illness:    Mary Mcfarland is a 64 y.o. female who presents via audio/video conferencing for a telehealth visit today.   The patient is referred for new consultation regarding atrial fibrillation by Dr Gwenlyn Found.  She was first diagnosed with atrial fibrillation in 2017 and then recurrence found in January of this year at PCP visit. She went in for evaluation of headaches, dizziness.  She was placed on BB and Eliquis.  She underwent attempted cardioversion in February which failed to restore SR. She is referred today for consideration of therapies for atrial fibrillation. She remains symptomatic with exercise intolerance and fatigue.   Echo 06/2019 demonstrated EF 60-65%, LA 45.   She works at [employer]. She drinks alcohol occasionally.   Today, she denies symptoms of palpitations, chest pain, shortness of breath, orthopnea, PND, lower extremity edema, claudication, presyncope, syncope, bleeding, or neurologic sequela. The patient is tolerating medications without difficulties and is otherwise without complaint today.   she denies symptoms of cough, fevers, chills, or new SOB worrisome for COVID 19.   Past Medical History:  Diagnosis Date  . Asthma   . Bipolar disorder ([place])   . Difficult intubation    Narrow airway  . Hypertension   .  Longstanding persistent atrial fibrillation ([place])   . Sleep apnea     Past Surgical History:  Procedure Laterality Date  . bone spur surgery    . CARDIOVERSION N/A 07/24/2019   Procedure: CARDIOVERSION;  Surgeon: Buford Dresser, MD;  Location: [facility] ENDOSCOPY;  Service: Cardiovascular;  Laterality: N/A;  . COLONOSCOPY    . HEEL SPUR RESECTION Left 03/15/2016   Procedure: ACHILLIES TENDON REPAIR AND PUMP BUMP EXCISION;  Surgeon: Melrose Nakayama, MD;  Location: [facility];  Service: Orthopedics;  Laterality: Left;  . TONSILLECTOMY      Current Outpatient Medications  Medication Sig Dispense Refill  . albuterol (PROVENTIL HFA;VENTOLIN HFA) 108 (90 Base) MCG/ACT inhaler Inhale 2 puffs into the lungs every 6 (six) hours as needed for wheezing or shortness of breath.    Marland Kitchen apixaban (ELIQUIS) 5 MG TABS tablet Take 5 mg by mouth 2 (two) times daily.    Marland Kitchen atorvastatin (LIPITOR) 40 MG tablet Take 1 tablet (40 mg total) by mouth daily. 90 tablet 3  . buPROPion (WELLBUTRIN SR) 150 MG 12 hr tablet Take 300 mg by mouth daily.    . Coenzyme Q10 (COQ10) 100 MG CAPS Take 100 mg by mouth daily.    Marland Kitchen ibuprofen (ADVIL) 800 MG tablet Take 800 mg by mouth at bedtime as needed (pain.).    Marland Kitchen lisinopril-hydrochlorothiazide (PRINZIDE,ZESTORETIC) 20-25 MG tablet Take 1 tablet by mouth daily.    . metoprolol tartrate (LOPRESSOR) 25 MG tablet Take 25 mg by mouth 2 (two) times daily.    . Naphazoline-Pheniramine (OPCON-A) 0.027-0.315 % SOLN Place 1-2 drops into both eyes 3 (three) times daily  as needed (allergy eyes.).    Marland Kitchen omeprazole (PRILOSEC) 20 MG capsule Take 20 mg by mouth daily.     No current facility-administered medications for this visit.    Allergies:   Niacin   Social History:  The patient  reports that she quit smoking about 23 years ago. Her smoking use included cigarettes. She smoked 1.00 pack per day. She has never used smokeless tobacco. She reports current alcohol use.   Family History:  The  patient's family history includes CVA in her mother; Cancer in her mother; Heart attack in her father; Heart disease in her father and mother; Hypertension in her father and mother.    ROS:  Please see the history of present illness.   All other systems are personally reviewed and negative.    Exam:    Vital Signs:  Ht 5\' 6"  (1.676 m)   Wt 260 lb (117.9 kg)   LMP 09/24/2013   BMI 41.97 kg/m    Well appearing, alert and conversant, regular work of breathing,  good skin color Eyes- anicteric, neuro- grossly intact, skin- no apparent rash or lesions or cyanosis, mouth- oral mucosa is pink   Labs/Other Tests and Data Reviewed:    Recent Labs: 06/24/2019: ALT 20; TSH 2.490 07/19/2019: BUN 20; Creatinine, Ser 0.96; Hemoglobin 14.2; Platelets 301; Potassium 4.8; Sodium 143   Wt Readings from Last 3 Encounters:  08/09/19 260 lb (117.9 kg)  08/06/19 263 lb (119.3 kg)  07/24/19 260 lb (117.9 kg)     Other studies personally reviewed: Additional studies/ records that were reviewed today include:Dr Berry's office note, prior EKGs  ASSESSMENT & PLAN:    1.  Persistent and probably longstanding persistent atrial fibrillation She is symptomatic with atrial fibrillation with exercise intolerance and fatigue CHADS2VASC is 2 - Continue Eliquis  Long discussion regarding treatment options and lifestyle modification Risks, benefits to [place] admission reviewed with patient who wishes to proceed. She has baseline RBBB, QTc ok. Will refer to AF clinic for initiation. Will need to stop HCTZ prior to admission.   2.  Obesity Body mass index is 41.97 kg/m. Weight loss encouraged  3.  HTN Stable No change required today  4. ? OSA Prior sleep study inconclusive  Will order repeat home sleep study  COVID screen The patient does not have any symptoms that suggest any further testing/ screening at this time.  Social distancing reinforced today.  Follow-up:  After Tikosyn admission     Patient Risk:  after full review of this patients clinical status, I feel that they are at moderate risk at this time.      Signed, Thompson Grayer MD, [place] 08/09/2019 1:21 PM   [place] [address] [phone] (office) [phone] (fax)

## 2019-08-09 NOTE — Telephone Encounter (Signed)
-----   Message from Thompson Grayer, MD sent at 08/09/2019  1:40 PM EST ----- Mary Mcfarland, schedule for initiation of tikosyn  Will need to stop hctz 3 days prior  Also please order a home sleep study as her prior "In office" study was inconclusive.

## 2019-08-12 ENCOUNTER — Other Ambulatory Visit (HOSPITAL_COMMUNITY): Payer: Self-pay | Admitting: *Deleted

## 2019-08-12 ENCOUNTER — Telehealth: Payer: Self-pay | Admitting: *Deleted

## 2019-08-12 ENCOUNTER — Telehealth: Payer: Self-pay | Admitting: Pharmacist

## 2019-08-12 ENCOUNTER — Encounter (HOSPITAL_COMMUNITY): Payer: Self-pay

## 2019-08-12 DIAGNOSIS — I10 Essential (primary) hypertension: Secondary | ICD-10-CM

## 2019-08-12 DIAGNOSIS — Z8249 Family history of ischemic heart disease and other diseases of the circulatory system: Secondary | ICD-10-CM

## 2019-08-12 DIAGNOSIS — R0683 Snoring: Secondary | ICD-10-CM

## 2019-08-12 MED ORDER — LISINOPRIL 20 MG PO TABS: 20.0000 mg | ORAL_TABLET | Freq: Every day | ORAL | 3 refills | Status: DC

## 2019-08-12 NOTE — Telephone Encounter (Signed)
Medication list reviewed in anticipation of upcoming Tikosyn initiation. Patient is taking HCTZ which is contraindicated with Tikosyn and will need to be stopped 3 days prior to Tikosyn load. Will need to monitor BP closely in case addition med changes are warranted.  Patient is anticoagulated on Eliquis 5mg  BID on the appropriate dose. Please ensure that patient has not missed any anticoagulation doses in the 3 weeks prior to Tikosyn initiation.   Patient will need to be counseled to avoid use of Benadryl while on Tikosyn and in the 2-3 days prior to Tikosyn initiation.

## 2019-08-12 NOTE — Telephone Encounter (Signed)
Patient notified to stop HCTZ 3 days prior to admission.

## 2019-08-12 NOTE — Telephone Encounter (Signed)
Home sleep study ordered per Dr Rayann Heman.

## 2019-08-12 NOTE — Telephone Encounter (Signed)
-----   Message from Stephani Police, RN sent at 08/09/2019  5:13 PM EST ----- I placed order but hope it is right.   ----- Message ----- From: Thompson Grayer, MD Sent: 08/09/2019   1:40 PM EST To: Juluis Mire, RN, Stephani Police, RN  Ann, schedule for initiation of tikosyn  Will need to stop hctz 3 days prior  Also please order a home sleep study as her prior "In office" study was inconclusive.

## 2019-08-16 ENCOUNTER — Other Ambulatory Visit: Payer: Self-pay | Admitting: Cardiovascular Disease

## 2019-08-16 NOTE — Telephone Encounter (Signed)
New Message    *STAT* If patient is at the pharmacy, call can be transferred to refill team.   1. Which medications need to be refilled? (please list name of each medication and dose if known)  metoprolol tartrate (LOPRESSOR) 25 MG tablet apixaban (ELIQUIS) 5 MG TABS tablet  2. Which pharmacy/location (including street and city if local pharmacy) is medication to be sent to? [place], [place]  3. Do they need a 30 day or 90 day supply? 30 or 90

## 2019-08-19 ENCOUNTER — Telehealth: Payer: Self-pay | Admitting: Internal Medicine

## 2019-08-19 NOTE — Telephone Encounter (Signed)
° °  Pt called and would like to speak with Dr. Rayann Heman nurse, She said she needs to clarify the employer note she received. The date is different, her procedure is on the 16th but on the letter it says it's on the 15th.  Please call.

## 2019-08-20 MED ORDER — METOPROLOL TARTRATE 25 MG PO TABS: 25.0000 mg | ORAL_TABLET | Freq: Two times a day (BID) | ORAL | 0 refills | Status: DC

## 2019-08-20 MED ORDER — APIXABAN 5 MG PO TABS: 5.0000 mg | ORAL_TABLET | Freq: Two times a day (BID) | ORAL | 1 refills | Status: DC

## 2019-08-23 ENCOUNTER — Ambulatory Visit: Payer: BLUE CROSS/BLUE SHIELD | Admitting: Cardiovascular Disease

## 2019-08-23 ENCOUNTER — Telehealth (HOSPITAL_COMMUNITY): Payer: Self-pay | Admitting: *Deleted

## 2019-08-23 ENCOUNTER — Other Ambulatory Visit (HOSPITAL_COMMUNITY)
Admission: RE | Admit: 2019-08-23 | Discharge: 2019-08-23 | Disposition: A | Discharge status: Home / Self Care | Payer: BLUE CROSS/BLUE SHIELD | Source: Ambulatory Visit | Attending: Internal Medicine | Admitting: Internal Medicine

## 2019-08-23 DIAGNOSIS — Z20822 Contact with and (suspected) exposure to covid-19: Secondary | ICD-10-CM | POA: Diagnosis not present

## 2019-08-23 DIAGNOSIS — Z01812 Encounter for preprocedural laboratory examination: Secondary | ICD-10-CM | POA: Diagnosis not present

## 2019-08-23 NOTE — Telephone Encounter (Signed)
Per [employer] - no pre service review required for planned admission. Reference number will be sent when inpatient notification received.

## 2019-08-24 LAB — SARS CORONAVIRUS 2 (TAT 6-24 HRS): SARS Coronavirus 2: NEGATIVE

## 2019-08-26 NOTE — Progress Notes (Signed)
Primary Care Physician: Lawerance Cruel, MD Primary Cardiologist: Dr Gwenlyn Found  Primary Electrophysiologist: Dr Rayann Heman Referring Physician: Dr [name] is a 64 y.o. female with a history of HTN, bipolar disorder, and persistent atrial fibrillation who presents for follow up in the [facility]. She was first diagnosed with atrial fibrillation in 2017 and then recurrence found in January of this year at PCP visit. She went in for evaluation of headaches, dizziness.  She was placed on BB and Eliquis.  She underwent attempted cardioversion in February which failed to restore SR. Patient is on Eliquis for a CHADS2VASC score of 3. She was evaluated by Dr Rayann Heman on 08/09/19 who recommended dofetilide. Patient stopped HCTZ about one week ago. She denies any missed doses of anticoagulation in the last 3 weeks.   Today, she denies symptoms of palpitations, chest pain, shortness of breath, orthopnea, PND, lower extremity edema, dizziness, presyncope, syncope, snoring, daytime somnolence, bleeding, or neurologic sequela. The patient is tolerating medications without difficulties and is otherwise without complaint today.    Atrial Fibrillation Risk Factors:  she does have symptoms or diagnosis of sleep apnea. Sleep study pending. she does not have a history of rheumatic fever. she does not have a history of alcohol use.   she has a BMI of Body mass index is 42.9 kg/m.Marland Kitchen Filed Weights   08/27/19 1130  Weight: 120.6 kg    Family History  Problem Relation Age of Onset  . Heart disease Mother   . CVA Mother   . Hypertension Mother   . Cancer Mother   . Heart disease Father   . Hypertension Father   . Heart attack Father      Atrial Fibrillation Management history:  Previous antiarrhythmic drugs: none Previous cardioversions: 07/24/19 Previous ablations: none CHADS2VASC score: 2 Anticoagulation history: Eliquis   Past Medical History:    Diagnosis Date  . Asthma   . Bipolar disorder ([place])   . Difficult intubation    Narrow airway  . Hypertension   . Longstanding persistent atrial fibrillation ([place])   . Sleep apnea    sleep study was "inconclusive" per patient   Past Surgical History:  Procedure Laterality Date  . bone spur surgery    . CARDIOVERSION N/A 07/24/2019   Procedure: CARDIOVERSION;  Surgeon: Buford Dresser, MD;  Location: [facility] ENDOSCOPY;  Service: Cardiovascular;  Laterality: N/A;  . COLONOSCOPY    . HEEL SPUR RESECTION Left 03/15/2016   Procedure: ACHILLIES TENDON REPAIR AND PUMP BUMP EXCISION;  Surgeon: Melrose Nakayama, MD;  Location: [facility];  Service: Orthopedics;  Laterality: Left;  . TONSILLECTOMY      Current Outpatient Medications  Medication Sig Dispense Refill  . albuterol (PROVENTIL HFA;VENTOLIN HFA) 108 (90 Base) MCG/ACT inhaler Inhale 2 puffs into the lungs every 6 (six) hours as needed for wheezing or shortness of breath.    Marland Kitchen apixaban (ELIQUIS) 5 MG TABS tablet Take 1 tablet (5 mg total) by mouth 2 (two) times daily. 180 tablet 1  . atorvastatin (LIPITOR) 40 MG tablet Take 1 tablet (40 mg total) by mouth daily. 90 tablet 3  . buPROPion (WELLBUTRIN SR) 150 MG 12 hr tablet Take 300 mg by mouth daily.    Marland Kitchen ibuprofen (ADVIL) 800 MG tablet Take 800 mg by mouth at bedtime as needed (pain.).    Marland Kitchen lisinopril (ZESTRIL) 20 MG tablet Take 1 tablet (20 mg total) by mouth daily. 30 tablet 3  . metoprolol tartrate (  LOPRESSOR) 25 MG tablet Take 1 tablet (25 mg total) by mouth 2 (two) times daily. 180 tablet 0  . Naphazoline-Pheniramine (OPCON-A) 0.027-0.315 % SOLN Place 1-2 drops into both eyes 3 (three) times daily as needed (allergy eyes.).    Marland Kitchen omeprazole (PRILOSEC) 20 MG capsule Take 20 mg by mouth daily.     No current facility-administered medications for this encounter.    Allergies  Allergen Reactions  . Niacin Hives    Social History   Socioeconomic History  . Marital status: Legally  Separated    Spouse name: Not on file  . Number of children: Not on file  . Years of education: Not on file  . Highest education level: Not on file  Occupational History  . Not on file  Tobacco Use  . Smoking status: Former Smoker    Packs/day: 1.00    Types: Cigarettes    Quit date: 02/13/1996    Years since quitting: 23.5  . Smokeless tobacco: Never Used  Substance and Sexual Activity  . Alcohol use: Yes    Alcohol/week: 2.0 standard drinks    Types: 2 Shots of liquor per week    Comment: rarely  . Drug use: Never  . Sexual activity: Not on file  Other Topics Concern  . 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 Difficulty of Paying Living Expenses:   Food Insecurity:   . Worried About Charity fundraiser in the Last Year:   . Arboriculturist in the Last Year:   Transportation Needs:   . Film/video editor (Medical):   Marland Kitchen Lack of Transportation (Non-Medical):   Physical Activity:   . Days of Exercise per Week:   . Minutes of Exercise per Session:   Stress:   . Feeling of Stress :   Social Connections:   . Frequency of Communication with Friends and Family:   . Frequency of Social Gatherings with Friends and Family:   . Attends Religious Services:   . Active Member of Clubs or Organizations:   . Attends Archivist Meetings:   Marland Kitchen Marital Status:   Intimate Partner Violence:   . Fear of Current or Ex-Partner:   . Emotionally Abused:   Marland Kitchen Physically Abused:   . Sexually Abused:      ROS- All systems are reviewed and negative except as per the HPI above.  Physical Exam: Vitals:   08/27/19 1130  BP: 116/74  Pulse: (!) 117  Weight: 120.6 kg  Height: 5\' 6"  (1.676 m)    GEN- The patient is well appearing obese female, alert and oriented x 3 today.   Head- normocephalic, atraumatic Eyes-  Sclera clear, conjunctiva  pink Ears- hearing intact Oropharynx- clear Neck- supple  Lungs- Clear to ausculation bilaterally, normal work of breathing Heart- irregular rate and rhythm, tachycardia, no murmurs, rubs or gallops  GI- soft, NT, ND, + BS Extremities- no clubbing, cyanosis, or edema MS- no significant deformity or atrophy Skin- no rash or lesion Psych- euthymic mood, full affect Neuro- strength and sensation are intact  Wt Readings from Last 3 Encounters:  08/27/19 120.6 kg  08/09/19 117.9 kg  08/06/19 119.3 kg    EKG today demonstrates afib HR 117, RBBB, QRS 142, QTc 530 (444 in SR with RBBB)  Echo 07/01/19 demonstrated  1. Left ventricular ejection fraction,  by visual estimation, is 60 to  65%. The left ventricle has normal function. There is mildly increased  left ventricular hypertrophy.  2. Left ventricular diastolic parameters are indeterminate.  3. The left ventricle has no regional wall motion abnormalities.  4. Global right ventricle has normal systolic function.The right  ventricular size is normal. No increase in right ventricular wall  thickness.  5. Left atrial size was moderately dilated.  6. Right atrial size was normal.  7. The mitral valve is normal in structure. Trivial mitral valve  regurgitation. No evidence of mitral stenosis.  8. The tricuspid valve is normal in structure.  9. The tricuspid valve is normal in structure. Tricuspid valve  regurgitation is not demonstrated.  10. The aortic valve is tricuspid. Aortic valve regurgitation is not  visualized. Mild to moderate aortic valve sclerosis/calcification without  any evidence of aortic stenosis.  11. Calcified non coronary cusp.  12. The pulmonic valve was normal in structure. Pulmonic valve  regurgitation is not visualized.  13. The inferior vena cava is normal in size with greater than 50%  respiratory variability, suggesting right atrial pressure of 3 mmHg.   Epic records are reviewed at length  today  CHA2DS2-VASc Score = 2 The patient's score is based upon: CHF History: No HTN History: Yes Age : < 65 Diabetes History: No Stroke History: No Vascular Disease History: No Gender: Female      ASSESSMENT AND PLAN: Persistent Atrial Fibrillation (ICD10:  I48.19) The patient's CHA2DS2-VASc score is 2, indicating a 2.2% annual risk of stroke.   Patient wants to pursue dofetilide, aware of risk vrs benefit Aware of price of dofetilide. Patient will continue on Eliquis, states no missed doses in the last 3 weeks. No benadryl use PharmD has screened drugs and no QT prolonging drugs on board. Patient stopped HCTZ 3 days prior. QTc in SR 444 ms in SR. RBBB at baseline.  Labs today show creatinine at 0.89, K+ 4.5 and mag 1.9, CrCl calculated at 126 mL/min.  2. Obesity Body mass index is 42.9 kg/m. Lifestyle modification was discussed at length including regular exercise and weight reduction.  3. Suspected OSA Sleep study pending.  4. HTN Stable, no changes today   To be admitted later today once a bed becomes available.   [facility] [address] [place], [place] [address] [phone] 08/27/2019 12:47 PM

## 2019-08-27 ENCOUNTER — Inpatient Hospital Stay (HOSPITAL_COMMUNITY)
Admission: RE | Admit: 2019-08-27 | Discharge: 2019-08-30 | DRG: 309 | Disposition: A | Discharge status: Home / Self Care | Payer: BLUE CROSS/BLUE SHIELD | Source: Ambulatory Visit | Attending: Internal Medicine | Admitting: Internal Medicine

## 2019-08-27 ENCOUNTER — Other Ambulatory Visit: Payer: Self-pay

## 2019-08-27 ENCOUNTER — Encounter (HOSPITAL_COMMUNITY): Payer: Self-pay | Admitting: Physician Assistant

## 2019-08-27 ENCOUNTER — Ambulatory Visit (HOSPITAL_COMMUNITY)
Admission: RE | Admit: 2019-08-27 | Discharge: 2019-08-27 | Disposition: A | Discharge status: Home / Self Care | Payer: BLUE CROSS/BLUE SHIELD | Source: Ambulatory Visit | Attending: Nurse Practitioner | Admitting: Nurse Practitioner

## 2019-08-27 ENCOUNTER — Encounter (HOSPITAL_COMMUNITY): Payer: Self-pay | Admitting: Internal Medicine

## 2019-08-27 VITALS — BP 116/74 | HR 117 | Ht 66.0 in | Wt 265.8 lb

## 2019-08-27 DIAGNOSIS — Z5181 Encounter for therapeutic drug level monitoring: Secondary | ICD-10-CM

## 2019-08-27 DIAGNOSIS — Z7901 Long term (current) use of anticoagulants: Secondary | ICD-10-CM | POA: Diagnosis not present

## 2019-08-27 DIAGNOSIS — I10 Essential (primary) hypertension: Secondary | ICD-10-CM | POA: Diagnosis not present

## 2019-08-27 DIAGNOSIS — E669 Obesity, unspecified: Secondary | ICD-10-CM | POA: Diagnosis not present

## 2019-08-27 DIAGNOSIS — Z79899 Other long term (current) drug therapy: Secondary | ICD-10-CM

## 2019-08-27 DIAGNOSIS — Z888 Allergy status to other drugs, medicaments and biological substances status: Secondary | ICD-10-CM | POA: Diagnosis not present

## 2019-08-27 DIAGNOSIS — I4819 Other persistent atrial fibrillation: Secondary | ICD-10-CM | POA: Diagnosis not present

## 2019-08-27 DIAGNOSIS — I440 Atrioventricular block, first degree: Secondary | ICD-10-CM | POA: Diagnosis present

## 2019-08-27 DIAGNOSIS — J45909 Unspecified asthma, uncomplicated: Secondary | ICD-10-CM | POA: Diagnosis not present

## 2019-08-27 DIAGNOSIS — F319 Bipolar disorder, unspecified: Secondary | ICD-10-CM | POA: Diagnosis present

## 2019-08-27 DIAGNOSIS — Z6841 Body Mass Index (BMI) 40.0 and over, adult: Secondary | ICD-10-CM

## 2019-08-27 DIAGNOSIS — Z823 Family history of stroke: Secondary | ICD-10-CM

## 2019-08-27 DIAGNOSIS — I4811 Longstanding persistent atrial fibrillation: Secondary | ICD-10-CM | POA: Diagnosis not present

## 2019-08-27 DIAGNOSIS — Z8249 Family history of ischemic heart disease and other diseases of the circulatory system: Secondary | ICD-10-CM

## 2019-08-27 DIAGNOSIS — I4510 Unspecified right bundle-branch block: Secondary | ICD-10-CM | POA: Diagnosis present

## 2019-08-27 DIAGNOSIS — Z87891 Personal history of nicotine dependence: Secondary | ICD-10-CM

## 2019-08-27 HISTORY — DX: Encounter for therapeutic drug level monitoring: Z51.81

## 2019-08-27 HISTORY — DX: Other long term (current) drug therapy: Z79.899

## 2019-08-27 LAB — BASIC METABOLIC PANEL
Anion gap: 11 (ref 5–15)
BUN: 21 mg/dL (ref 8–23)
CO2: 25 mmol/L (ref 22–32)
Calcium: 9.3 mg/dL (ref 8.9–10.3)
Chloride: 107 mmol/L (ref 98–111)
Creatinine, Ser: 0.89 mg/dL (ref 0.44–1.00)
GFR calc Af Amer: >60 mL/min (ref >60)
GFR calc non Af Amer: >60 mL/min (ref >60)
Glucose, Bld: 93 mg/dL (ref 70–99)
Potassium: 4.5 mmol/L (ref 3.5–5.1)
Sodium: 143 mmol/L (ref 135–145)

## 2019-08-27 LAB — MAGNESIUM: Magnesium: 1.9 mg/dL (ref 1.7–2.4)

## 2019-08-27 MED ORDER — MAGNESIUM SULFATE 2 GM/50ML IV SOLN
2.0000 g | Freq: Once | INTRAVENOUS | Status: AC
  Administered 2019-08-27: 2 g via INTRAVENOUS
  Filled 2019-08-27: qty 50

## 2019-08-27 MED ORDER — NAPHAZOLINE-PHENIRAMINE 0.027-0.315 % OP SOLN: 1.0000 [drp] | Freq: Three times a day (TID) | OPHTHALMIC | Status: DC | PRN

## 2019-08-27 MED ORDER — SODIUM CHLORIDE 0.9% FLUSH
3.0000 mL | Freq: Two times a day (BID) | INTRAVENOUS | Status: DC
  Administered 2019-08-27 – 2019-08-30 (×4): 3 mL via INTRAVENOUS

## 2019-08-27 MED ORDER — DOFETILIDE 500 MCG PO CAPS
500.0000 ug | ORAL_CAPSULE | Freq: Two times a day (BID) | ORAL | Status: DC
  Administered 2019-08-27 – 2019-08-30 (×6): 500 ug via ORAL
  Filled 2019-08-27 (×6): qty 1

## 2019-08-27 MED ORDER — SODIUM CHLORIDE 0.9% FLUSH: 3.0000 mL | INTRAVENOUS | Status: DC | PRN

## 2019-08-27 MED ORDER — PANTOPRAZOLE SODIUM 40 MG PO TBEC
40.0000 mg | DELAYED_RELEASE_TABLET | Freq: Every day | ORAL | Status: DC
  Administered 2019-08-28 – 2019-08-30 (×3): 40 mg via ORAL
  Filled 2019-08-27 (×3): qty 1

## 2019-08-27 MED ORDER — NAPHAZOLINE-PHENIRAMINE 0.025-0.3 % OP SOLN
1.0000 [drp] | Freq: Three times a day (TID) | OPHTHALMIC | Status: DC | PRN
  Filled 2019-08-27: qty 15

## 2019-08-27 MED ORDER — BUPROPION HCL ER (SR) 150 MG PO TB12
300.0000 mg | ORAL_TABLET | Freq: Every day | ORAL | Status: DC
  Administered 2019-08-28 – 2019-08-30 (×3): 300 mg via ORAL
  Filled 2019-08-27 (×3): qty 2

## 2019-08-27 MED ORDER — APIXABAN 5 MG PO TABS
5.0000 mg | ORAL_TABLET | Freq: Two times a day (BID) | ORAL | Status: DC
  Administered 2019-08-27 – 2019-08-30 (×6): 5 mg via ORAL
  Filled 2019-08-27 (×6): qty 1

## 2019-08-27 MED ORDER — LISINOPRIL 20 MG PO TABS
20.0000 mg | ORAL_TABLET | Freq: Every day | ORAL | Status: DC
  Administered 2019-08-28: 20 mg via ORAL
  Filled 2019-08-27 (×2): qty 1

## 2019-08-27 MED ORDER — ALBUTEROL SULFATE (2.5 MG/3ML) 0.083% IN NEBU: 3.0000 mL | INHALATION_SOLUTION | Freq: Four times a day (QID) | RESPIRATORY_TRACT | Status: DC | PRN

## 2019-08-27 MED ORDER — SODIUM CHLORIDE 0.9 % IV SOLN: 250.0000 mL | INTRAVENOUS | Status: DC | PRN

## 2019-08-27 MED ORDER — METOPROLOL TARTRATE 25 MG PO TABS
25.0000 mg | ORAL_TABLET | Freq: Two times a day (BID) | ORAL | Status: DC
  Administered 2019-08-27 – 2019-08-28 (×3): 25 mg via ORAL
  Filled 2019-08-27 (×3): qty 1

## 2019-08-27 MED ORDER — ATORVASTATIN CALCIUM 40 MG PO TABS
40.0000 mg | ORAL_TABLET | Freq: Every day | ORAL | Status: DC
  Administered 2019-08-28 – 2019-08-30 (×3): 40 mg via ORAL
  Filled 2019-08-27 (×3): qty 1

## 2019-08-27 NOTE — H&P (Signed)
Primary Care Physician: Lawerance Cruel, MD Primary Cardiologist: Dr Gwenlyn Found  Primary Electrophysiologist: Dr Rayann Heman Referring Physician: Dr [name] is a 64 y.o. female with a history of HTN, bipolar disorder, and persistent atrial fibrillation who presents for follow up in the [facility]. She was first diagnosed with atrial fibrillation in 2017 and then recurrence found in January of this year at PCP visit. She went in for evaluation of headaches, dizziness.  She was placed on BB and Eliquis.  She underwent attempted cardioversion in February which failed to restore SR. Patient is on Eliquis for a CHADS2VASC score of 3. She was evaluated by Dr Rayann Heman on 08/09/19 who recommended dofetilide. Patient stopped HCTZ about one week ago. She denies any missed doses of anticoagulation in the last 3 weeks.   Today, she denies symptoms of palpitations, chest pain, shortness of breath, orthopnea, PND, lower extremity edema, dizziness, presyncope, syncope, snoring, daytime somnolence, bleeding, or neurologic sequela. The patient is tolerating medications without difficulties and is otherwise without complaint today.    Atrial Fibrillation Risk Factors:  she does have symptoms or diagnosis of sleep apnea. Sleep study pending. she does not have a history of rheumatic fever. she does not have a history of alcohol use.   she has a BMI of There is no height or weight on file to calculate BMI.. There were no vitals filed for this visit.  Family History  Problem Relation Age of Onset  . Heart disease Mother   . CVA Mother   . Hypertension Mother   . Cancer Mother   . Heart disease Father   . Hypertension Father   . Heart attack Father      Atrial Fibrillation Management history:  Previous antiarrhythmic drugs: none Previous cardioversions: 07/24/19 Previous ablations: none CHADS2VASC score: 2 Anticoagulation history: Eliquis   Past  Medical History:  Diagnosis Date  . Asthma   . Bipolar disorder ([place])   . Difficult intubation    Narrow airway  . Hypertension   . Longstanding persistent atrial fibrillation ([place])   . Sleep apnea    sleep study was "inconclusive" per patient  . Visit for monitoring Tikosyn therapy 08/27/2019   Past Surgical History:  Procedure Laterality Date  . bone spur surgery    . CARDIOVERSION N/A 07/24/2019   Procedure: CARDIOVERSION;  Surgeon: Buford Dresser, MD;  Location: [facility] ENDOSCOPY;  Service: Cardiovascular;  Laterality: N/A;  . COLONOSCOPY    . HEEL SPUR RESECTION Left 03/15/2016   Procedure: ACHILLIES TENDON REPAIR AND PUMP BUMP EXCISION;  Surgeon: Melrose Nakayama, MD;  Location: [place];  Service: Orthopedics;  Laterality: Left;  . TONSILLECTOMY      Current Facility-Administered Medications  Medication Dose Route Frequency Provider Last Rate Last Admin  . 0.9 %  sodium chloride infusion  250 mL Intravenous PRN Fenton, Clint R, PA      . albuterol (PROVENTIL) (2.5 MG/3ML) 0.083% nebulizer solution 3 mL  3 mL Inhalation Q6H PRN Fenton, Clint R, PA      . apixaban (ELIQUIS) tablet 5 mg  5 mg Oral BID Fenton, Clint R, PA      . atorvastatin (LIPITOR) tablet 40 mg  40 mg Oral Daily Fenton, Clint R, PA      . buPROPion (WELLBUTRIN SR) 12 hr tablet 300 mg  300 mg Oral Daily Fenton, Clint R, PA      . dofetilide (TIKOSYN) capsule 500 mcg  500 mcg Oral  BID Fenton, Clint R, PA   500 mcg at 08/27/19 2007  . lisinopril (ZESTRIL) tablet 20 mg  20 mg Oral Daily Fenton, Clint R, PA      . magnesium sulfate IVPB 2 g 50 mL  2 g Intravenous Once Einar Grad, RPH 50 mL/hr at 08/27/19 2018 2 g at 08/27/19 2018  . metoprolol tartrate (LOPRESSOR) tablet 25 mg  25 mg Oral BID Fenton, Clint R, PA      . naphazoline-pheniramine (NAPHCON-A) 0.025-0.3 % ophthalmic solution 1 drop  1 drop Both Eyes TID PRN Genieve Ramaswamy, MD      . pantoprazole (PROTONIX) EC tablet 40 mg  40 mg Oral Daily Fenton,  Clint R, PA      . sodium chloride flush (NS) 0.9 % injection 3 mL  3 mL Intravenous Q12H Fenton, Clint R, PA   3 mL at 08/27/19 2008  . sodium chloride flush (NS) 0.9 % injection 3 mL  3 mL Intravenous PRN Fenton, Clint R, PA        Allergies  Allergen Reactions  . Niacin Hives  . Horse-Derived Products Swelling and Other (See Comments)    Eyes swell     Social History   Socioeconomic History  . Marital status: Legally Separated    Spouse name: Not on file  . Number of children: Not on file  . Years of education: Not on file  . Highest education level: Not on file  Occupational History  . Not on file  Tobacco Use  . Smoking status: Former Smoker    Packs/day: 1.00    Types: Cigarettes    Quit date: 02/13/1996    Years since quitting: 23.5  . Smokeless tobacco: Never Used  Substance and Sexual Activity  . Alcohol use: Yes    Alcohol/week: 2.0 standard drinks    Types: 2 Shots of liquor per week    Comment: rarely  . Drug use: Never  . Sexual activity: Not on file  Other Topics Concern  . 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 Difficulty of Paying Living Expenses:   Food Insecurity:   . Worried About Charity fundraiser in the Last Year:   . Arboriculturist in the Last Year:   Transportation Needs:   . Film/video editor (Medical):   Marland Kitchen Lack of Transportation (Non-Medical):   Physical Activity:   . Days of Exercise per Week:   . Minutes of Exercise per Session:   Stress:   . Feeling of Stress :   Social Connections:   . Frequency of Communication with Friends and Family:   . Frequency of Social Gatherings with Friends and Family:   . Attends Religious Services:   . Active Member of Clubs or Organizations:   . Attends Archivist Meetings:   Marland Kitchen Marital Status:   Intimate Partner Violence:   . Fear of Current or  Ex-Partner:   . Emotionally Abused:   Marland Kitchen Physically Abused:   . Sexually Abused:      ROS- All systems are reviewed and negative except as per the HPI above.  Physical Exam: Vitals:   08/27/19 1521 08/27/19 1954  BP: (!) 109/99 137/70  Pulse: 73 73  Resp: 18 20  Temp: 98.2 F (36.8 C) 98.3 F (36.8 C)  TempSrc: Oral Oral  SpO2: 98% 97%    GEN- The patient is well appearing obese female, alert and oriented x 3 today.   Head- normocephalic, atraumatic Eyes-  Sclera clear, conjunctiva pink Ears- hearing intact Oropharynx- clear Neck- supple  Lungs- Clear to ausculation bilaterally, normal work of breathing Heart- irregular rate and rhythm, tachycardia, no murmurs, rubs or gallops  GI- soft, NT, ND, + BS Extremities- no clubbing, cyanosis, or edema MS- no significant deformity or atrophy Skin- no rash or lesion Psych- euthymic mood, full affect Neuro- strength and sensation are intact  Wt Readings from Last 3 Encounters:  08/27/19 120.6 kg  08/09/19 117.9 kg  08/06/19 119.3 kg    EKG today demonstrates afib HR 117, RBBB, QRS 142, QTc 530 (444 in SR with RBBB)  Echo 07/01/19 demonstrated  1. Left ventricular ejection fraction, by visual estimation, is 60 to  65%. The left ventricle has normal function. There is mildly increased  left ventricular hypertrophy.  2. Left ventricular diastolic parameters are indeterminate.  3. The left ventricle has no regional wall motion abnormalities.  4. Global right ventricle has normal systolic function.The right  ventricular size is normal. No increase in right ventricular wall  thickness.  5. Left atrial size was moderately dilated.  6. Right atrial size was normal.  7. The mitral valve is normal in structure. Trivial mitral valve  regurgitation. No evidence of mitral stenosis.  8. The tricuspid valve is normal in structure.  9. The tricuspid valve is normal in structure. Tricuspid valve  regurgitation is not  demonstrated.  10. The aortic valve is tricuspid. Aortic valve regurgitation is not  visualized. Mild to moderate aortic valve sclerosis/calcification without  any evidence of aortic stenosis.  11. Calcified non coronary cusp.  12. The pulmonic valve was normal in structure. Pulmonic valve  regurgitation is not visualized.  13. The inferior vena cava is normal in size with greater than 50%  respiratory variability, suggesting right atrial pressure of 3 mmHg.   Epic records are reviewed at length today  CHA2DS2-VASc Score =   The patient's score is based upon:        ASSESSMENT AND PLAN: Persistent Atrial Fibrillation (ICD10:  I48.19) The patient's CHA2DS2-VASc score is  , indicating a  % annual risk of stroke.   Patient wants to pursue dofetilide, aware of risk vrs benefit Aware of price of dofetilide. Patient will continue on Eliquis, states no missed doses in the last 3 weeks. No benadryl use PharmD has screened drugs and no QT prolonging drugs on board. Patient stopped HCTZ 3 days prior. QTc in SR 444 ms in SR. RBBB at baseline.  Labs today show creatinine at 0.89, K+ 4.5 and mag 1.9, CrCl calculated at 126 mL/min.  2. Obesity There is no height or weight on file to calculate BMI. Lifestyle modification was discussed at length including regular exercise and weight reduction.  3. Suspected OSA Sleep study pending.  4. HTN Stable, no changes today   To be admitted later today once a bed becomes available.   [facility] [address] [place], [place] [address] [phone] 08/27/2019 9:17 PM   I have seen, examined the patient, and reviewed the above assessment and plan.  Changes to above are made where necessary.  On exam, morbidly obese, iRRR.  The patient presents with symptomatic persistent afib.  She reports compliance with eliquis without interruption.  We will plan admission for initiation of tikosyn as this time. I  spoke extensively with the patient regarding this medicine.  She understands risks and wishes to proceed.  Co Sign: Thompson Grayer, MD 08/27/2019 9:17 PM

## 2019-08-27 NOTE — Progress Notes (Signed)
I have reviewed today's ekg Manually measure QT 320-246ms QRS 19ms Allowing for her RBBB, QTc by my measurement is ok for start of [place] help with mag replacement  Tommye Standard, PA-C

## 2019-08-27 NOTE — Progress Notes (Signed)
Pharmacy: Dofetilide (Tikosyn) - Initial Consult Assessment and Electrolyte Replacement  Pharmacy consulted to assist in monitoring and replacing electrolytes in this 64 y.o. female admitted on 08/27/2019 undergoing dofetilide initiation. First dofetilide dose: 533mcg q12h starting 3/16 2000  Assessment:  Patient Exclusion Criteria: If any screening criteria checked as "Yes", then  patient  should NOT receive dofetilide until criteria item is corrected.  If "Yes" please indicate correction plan.  YES  NO Patient  Exclusion Criteria Correction Plan   [x]   []   Baseline QTc interval is greater than or equal to 440 msec. IF above YES box checked dofetilide contraindicated unless patient has ICD; then may proceed if QTc 500-550 msec or with known ventricular conduction abnormalities may proceed with QTc 550-600 msec. QTc = 444 EP aware   []   [x]   Patient is known or suspected to have a digoxin level greater than 2 ng/ml: No results found for: DIGOXIN     []   [x]   Creatinine clearance less than 20 ml/min (calculated using Cockcroft-Gault, actual body weight and serum creatinine): Estimated Creatinine Clearance: 87.8 mL/min (by C-G formula based on SCr of 0.89 mg/dL).     []   [x]  Patient has received drugs known to prolong the QT intervals within the last 48 hours (phenothiazines, tricyclics or tetracyclic antidepressants, erythromycin, H-1 antihistamines, cisapride, fluoroquinolones, azithromycin). Updated information on QT prolonging agents is available to be searched on the following database:QT prolonging agents     []   [x]   Patient received a dose of hydrochlorothiazide (O[place]ic) alone or in any combination including triamterene (Dyazide, Maxzide) in the last 48 hours. HCTZ stopped ~1 week ago per pt   []   [x]  Patient received a medication known to increase dofetilide plasma concentrations prior to initial dofetilide dose:  . Trimethoprim (Primsol, Proloprim) in the last 36  hours . Verapamil (Calan, Verelan) in the last 36 hours or a sustained release dose in the last 72 hours . Megestrol (Megace) in the last 5 days  . Cimetidine (Tagamet) in the last 6 hours . Ketoconazole (Nizoral) in the last 24 hours . Itraconazole (Sporanox) in the last 48 hours  . Prochlorperazine (Compazine) in the last 36 hours     []   [x]   Patient is known to have a history of torsades de pointes; congenital or acquired long QT syndromes.    []   [x]   Patient has received a Class 1 antiarrhythmic with less than 2 half-lives since last dose. (Disopyramide, Quinidine, Procainamide, Lidocaine, Mexiletine, Flecainide, Propafenone)    []   [x]   Patient has received amiodarone therapy in the past 3 months or amiodarone level is greater than 0.3 ng/ml.    Patient has been appropriately anticoagulated with apixaban 5mg  BID (last dose this am).  Labs:    Component Value Date/Time   K 4.5 08/27/2019 1126   MG 1.9 08/27/2019 1126     Plan: Potassium: K >/= 4: Appropriate to initiate Tikosyn, no replacement needed    Magnesium: Mg 1.8-2: Give Mg 2 gm IV x1 to prevent Mg from dropping below 1.8 - do not need to recheck Mg. Appropriate to initiate Tikosyn   Thank you for allowing pharmacy to participate in this patient's care   Arrie Senate, PharmD, BCPS Clinical Pharmacist [phone] Please check AMION for all [place] numbers 08/27/2019

## 2019-08-28 LAB — HIV ANTIBODY (ROUTINE TESTING W REFLEX): HIV Screen 4th Generation wRfx: NONREACTIVE

## 2019-08-28 LAB — BASIC METABOLIC PANEL
Anion gap: 10 (ref 5–15)
BUN: 18 mg/dL (ref 8–23)
CO2: 27 mmol/L (ref 22–32)
Calcium: 9 mg/dL (ref 8.9–10.3)
Chloride: 105 mmol/L (ref 98–111)
Creatinine, Ser: 0.73 mg/dL (ref 0.44–1.00)
GFR calc Af Amer: >60 mL/min (ref >60)
GFR calc non Af Amer: >60 mL/min (ref >60)
Glucose, Bld: 116 mg/dL — ABNORMAL HIGH (ref 70–99)
Potassium: 3.4 mmol/L — ABNORMAL LOW (ref 3.5–5.1)
Sodium: 142 mmol/L (ref 135–145)

## 2019-08-28 LAB — MAGNESIUM: Magnesium: 2.1 mg/dL (ref 1.7–2.4)

## 2019-08-28 LAB — POTASSIUM: Potassium: 4.4 mmol/L (ref 3.5–5.1)

## 2019-08-28 MED ORDER — TRAZODONE HCL 50 MG PO TABS
50.0000 mg | ORAL_TABLET | Freq: Every evening | ORAL | Status: DC | PRN
  Filled 2019-08-28: qty 1

## 2019-08-28 MED ORDER — MELATONIN 3 MG PO TABS
6.0000 mg | ORAL_TABLET | Freq: Once | ORAL | Status: AC
  Administered 2019-08-28: 6 mg via ORAL
  Filled 2019-08-28: qty 2

## 2019-08-28 MED ORDER — POTASSIUM CHLORIDE CRYS ER 20 MEQ PO TBCR
40.0000 meq | EXTENDED_RELEASE_TABLET | ORAL | Status: AC
  Administered 2019-08-28 (×2): 40 meq via ORAL
  Filled 2019-08-28 (×2): qty 2

## 2019-08-28 MED ORDER — ACETAMINOPHEN 325 MG PO TABS
650.0000 mg | ORAL_TABLET | Freq: Four times a day (QID) | ORAL | Status: DC | PRN
  Administered 2019-08-28 – 2019-08-30 (×4): 650 mg via ORAL
  Filled 2019-08-28 (×4): qty 2

## 2019-08-28 NOTE — Progress Notes (Signed)
Pharmacy: Dofetilide (Tikosyn) - Follow Up Assessment and Electrolyte Replacement  Pharmacy consulted to assist in monitoring and replacing electrolytes in this 64 y.o. female admitted on 08/27/2019 undergoing dofetilide initiation. First dofetilide dose: 553mcg q12h was 3/16 2000.  Labs:    Component Value Date/Time   K 3.4 (L) 08/28/2019 0416   MG 2.1 08/28/2019 0416     Plan: Potassium: K < 3.5:  Give KCl 40 mEq q4hr x2 (ordered as q3h x2 by MD)  Magnesium: Mg > 2: No additional supplementation needed    Thank you for allowing pharmacy to participate in this patient's care   Arrie Senate, PharmD, BCPS Clinical Pharmacist [phone] Please check AMION for all [place] numbers 08/28/2019

## 2019-08-28 NOTE — Plan of Care (Signed)

## 2019-08-28 NOTE — Progress Notes (Signed)
Post dose EKG is reviewed Measured QT 462ms, QTc 436ms, given RBBB remains acceptable.  Tommye Standard, PA-C

## 2019-08-28 NOTE — Progress Notes (Addendum)
Progress Note  Patient Name: Mary Mcfarland Date of Encounter: 08/28/2019  Primary Cardiologist: Dr.Berry  Subjective   Unable to sleep well last night, alarms kept ringing, otherwise doing ok  Inpatient Medications    Scheduled Meds: . apixaban  5 mg Oral BID  . atorvastatin  40 mg Oral Daily  . buPROPion  300 mg Oral Daily  . dofetilide  500 mcg Oral BID  . lisinopril  20 mg Oral Daily  . metoprolol tartrate  25 mg Oral BID  . pantoprazole  40 mg Oral Daily  . potassium chloride  40 mEq Oral Q3H  . sodium chloride flush  3 mL Intravenous Q12H   Continuous Infusions: . sodium chloride     PRN Meds: sodium chloride, acetaminophen, albuterol, naphazoline-pheniramine, sodium chloride flush   Vital Signs    Vitals:   08/27/19 1521 08/27/19 1954 08/28/19 0516  BP: (!) 109/99 137/70 (!) 118/51  Pulse: 73 73 87  Resp: 18 20 16   Temp: 98.2 F (36.8 C) 98.3 F (36.8 C) 97.7 F (36.5 C)  TempSrc: Oral Oral Oral  SpO2: 98% 97% 97%  Weight:   119.7 kg    Intake/Output Summary (Last 24 hours) at 08/28/2019 0746 Last data filed at 08/27/2019 2007 Gross per 24 hour  Intake 3 ml  Output --  Net 3 ml   Last 3 Weights 08/28/2019 08/27/2019 08/09/2019  Weight (lbs) 263 lb 12.8 oz 265 lb 12.8 oz 260 lb  Weight (kg) 119.659 kg 120.566 kg 117.935 kg      Telemetry    Afib 80's-90's - Personally Reviewed  ECG    AFib 97bpm, RBBB, manuallymeasured QT 400-470ms, allowing for RBBB, QRS 117ms, QTc 458-42ms - Personally Reviewed  Physical Exam   GEN: No acute distress.   Neck: No JVD Cardiac: irreg-irreg, no murmurs, rubs, or gallops.  Respiratory: CTA b/l. GI: Soft, nontender, non-distended, obese MS: No edema; No deformity. Neuro:  Nonfocal  Psych: Normal affect   Labs    High Sensitivity Troponin:  No results for input(s): TROPONINIHS in the last 720 hours.    Chemistry Recent Labs  Lab 08/27/19 1126 08/28/19 0416  NA 143 142  K 4.5 3.4*  CL 107  105  CO2 25 27  GLUCOSE 93 116*  BUN 21 18  CREATININE 0.89 0.73  CALCIUM 9.3 9.0  GFRNONAA >60 >60  GFRAA >60 >60  ANIONGAP 11 10     HematologyNo results for input(s): WBC, RBC, HGB, HCT, MCV, MCH, MCHC, RDW, PLT in the last 168 hours.  BNPNo results for input(s): BNP, PROBNP in the last 168 hours.   DDimer No results for input(s): DDIMER in the last 168 hours.   Radiology    No results found.  Cardiac Studies   Echo 07/01/19 demonstrated  1. Left ventricular ejection fraction, by visual estimation, is 60 to  65%. The left ventricle has normal function. There is mildly increased  left ventricular hypertrophy.  2. Left ventricular diastolic parameters are indeterminate.  3. The left ventricle has no regional wall motion abnormalities.  4. Global right ventricle has normal systolic function.The right  ventricular size is normal. No increase in right ventricular wall  thickness.  5. Left atrial size was moderately dilated.  6. Right atrial size was normal.  7. The mitral valve is normal in structure. Trivial mitral valve  regurgitation. No evidence of mitral stenosis.  8. The tricuspid valve is normal in structure.  9. The tricuspid valve is normal  in structure. Tricuspid valve  regurgitation is not demonstrated.  10. The aortic valve is tricuspid. Aortic valve regurgitation is not  visualized. Mild to moderate aortic valve sclerosis/calcification without  any evidence of aortic stenosis.  11. Calcified non coronary cusp.  12. The pulmonic valve was normal in structure. Pulmonic valve  regurgitation is not visualized.  13. The inferior vena cava is normal in size with greater than 50%  respiratory variability, suggesting right atrial pressure of 3 mmHg.   Patient Profile     64 y.o. female w/PMHx of bipolar disorder, HTN, obesity, and persistent AFib admitted for Tikosyn initiation  Assessment & Plan    1. Persistent AFib     CHADs2vasc is 2 (including  gender), on Eliquis, appropriately dosed     K+ 3.4 (replacement ordered)     Mag 2.1     Creat 0.73 (stable     EKG is reviewed by dr. Rayann Heman as well, OK to continue same dose this AM  2. HTN     No changes   For questions or updates, please contact [place] Please consult [email or web address] for contact info under        Signed, Baldwin Jamaica, PA-C  08/28/2019, 7:46 AM     I have seen, examined the patient, and reviewed the above assessment and plan.  Changes to above are made where necessary.  On exam, iRRR.  Doing well with tikosyn after 1 dose.  Continue to follow EKG.  Will make NPO for possible cardioversion tomorrow if she does not convert to sinus in the interim.  Co Sign: Thompson Grayer, MD 08/28/2019 9:32 PM

## 2019-08-28 NOTE — Care Management (Signed)
1556 08-28-19 Case Manager received a consult for Tikosyn Load. Benefits check submitted. Patient uses the neighborhood [facility] on [address]. Case Manager did call [facility] and the pharmacy can order the medications. Patient contacted her insurance prior to admission and she received the co pay of being $70.00. No further needs from Case Manager at this time.   Bethena Roys , RN,BSN Case Manager [phone]

## 2019-08-28 NOTE — TOC Benefit Eligibility Note (Signed)
Transition of Care [facility]) Benefit Eligibility Note    Patient Details  Name: ANSHU STAAL MRN: [id number] Date of Birth: 04-25-1956   Medication/Dose: Sotalol 240 mcg  Covered?: Yes  Tier: 3 Drug  Prescription Coverage Preferred Pharmacy: Any retail Pharmacy  Spoke with Person/Company/Phone Number:: Suezanne Jacquet / [facility]/ [phone]  Co-Pay: No copay for a 30 or 90 day supply  Prior Approval: No  Deductible: (No Deductible)  Additional Notes: Tikosin 567mcg is not covered/ There is another alternative  Multag 444mcg 70.00 for a 30 day supply 175.00 for 90 day supply no prior [employer] Phone Number: 08/28/2019, 4:25 PM

## 2019-08-29 ENCOUNTER — Other Ambulatory Visit: Payer: Self-pay

## 2019-08-29 ENCOUNTER — Encounter (HOSPITAL_COMMUNITY)
Admission: RE | Disposition: A | Discharge status: Home / Self Care | Payer: Self-pay | Source: Ambulatory Visit | Attending: Internal Medicine

## 2019-08-29 DIAGNOSIS — I4819 Other persistent atrial fibrillation: Secondary | ICD-10-CM | POA: Diagnosis not present

## 2019-08-29 LAB — BASIC METABOLIC PANEL
Anion gap: 8 (ref 5–15)
BUN: 22 mg/dL (ref 8–23)
CO2: 26 mmol/L (ref 22–32)
Calcium: 9.1 mg/dL (ref 8.9–10.3)
Chloride: 107 mmol/L (ref 98–111)
Creatinine, Ser: 0.94 mg/dL (ref 0.44–1.00)
GFR calc Af Amer: >60 mL/min (ref >60)
GFR calc non Af Amer: >60 mL/min (ref >60)
Glucose, Bld: 110 mg/dL — ABNORMAL HIGH (ref 70–99)
Potassium: 4.4 mmol/L (ref 3.5–5.1)
Sodium: 141 mmol/L (ref 135–145)

## 2019-08-29 LAB — MAGNESIUM: Magnesium: 2 mg/dL (ref 1.7–2.4)

## 2019-08-29 SURGERY — CARDIOVERSION
Anesthesia: General

## 2019-08-29 MED ORDER — MELATONIN 3 MG PO TABS
6.0000 mg | ORAL_TABLET | Freq: Once | ORAL | Status: AC
  Administered 2019-08-29: 6 mg via ORAL
  Filled 2019-08-29: qty 2

## 2019-08-29 MED ORDER — METOPROLOL TARTRATE 12.5 MG HALF TABLET
12.5000 mg | ORAL_TABLET | Freq: Two times a day (BID) | ORAL | Status: DC
  Administered 2019-08-29: 12.5 mg via ORAL
  Filled 2019-08-29: qty 1

## 2019-08-29 MED ORDER — LISINOPRIL 5 MG PO TABS
5.0000 mg | ORAL_TABLET | Freq: Every day | ORAL | Status: DC
  Administered 2019-08-30: 5 mg via ORAL
  Filled 2019-08-29: qty 1

## 2019-08-29 NOTE — Progress Notes (Signed)
Pharmacy: Dofetilide (Tikosyn) - Follow Up Assessment and Electrolyte Replacement  Pharmacy consulted to assist in monitoring and replacing electrolytes in this 64 y.o. female admitted on 08/27/2019 undergoing dofetilide initiation. First dofetilide dose: 512mcg q12h was 3/16 2000.  Labs:    Component Value Date/Time   K 4.4 08/29/2019 0251   MG 2.0 08/29/2019 0251     Plan: Potassium: K >/= 4: No additional supplementation needed  Magnesium: Mg > 2: No additional supplementation needed    Thank you for allowing pharmacy to participate in this patient's care   Arrie Senate, PharmD, BCPS Clinical Pharmacist [phone] Please check AMION for all [place] numbers 08/29/2019

## 2019-08-29 NOTE — Progress Notes (Signed)
Cardiac Monitoring Event  Dysrhythmia: Junctional at 1237  Symptoms:  None  Level of Consciousness:  Alert  Last set of vital signs taken:  Temp: 97.8 F (36.6 C)  Pulse Rate: 69  Resp: 15  BP: (!) 97/49  SpO2: 98 %  Name of MD Notified:  Tommye Standard  Time MD Notified:  1240  Comments/Actions Taken:  EKG obtained to confirm

## 2019-08-29 NOTE — Progress Notes (Addendum)
Progress Note  Patient Name: Mary Mcfarland Date of Encounter: 08/29/2019  Primary Cardiologist: Dr.Berry  Subjective   Unable to sleep well last night, alarms kept ringing, otherwise doing ok  Inpatient Medications    Scheduled Meds: . apixaban  5 mg Oral BID  . atorvastatin  40 mg Oral Daily  . buPROPion  300 mg Oral Daily  . dofetilide  500 mcg Oral BID  . lisinopril  20 mg Oral Daily  . metoprolol tartrate  12.5 mg Oral BID  . pantoprazole  40 mg Oral Daily  . sodium chloride flush  3 mL Intravenous Q12H   Continuous Infusions: . sodium chloride     PRN Meds: sodium chloride, acetaminophen, albuterol, naphazoline-pheniramine, sodium chloride flush   Vital Signs    Vitals:   08/28/19 0516 08/28/19 0953 08/28/19 2235 08/29/19 0653  BP: (!) 118/51 123/65 (!) 106/50 (!) 95/52  Pulse: 87 85 90 (!) 56  Resp: 16  17 15   Temp: 97.7 F (36.5 C)  97.9 F (36.6 C) 97.8 F (36.6 C)  TempSrc: Oral  Oral Oral  SpO2: 97%  96% 98%  Weight: 119.7 kg   119.8 kg    Intake/Output Summary (Last 24 hours) at 08/29/2019 0805 Last data filed at 08/28/2019 1844 Gross per 24 hour  Intake 843 ml  Output --  Net 843 ml   Last 3 Weights 08/29/2019 08/28/2019 08/27/2019  Weight (lbs) 264 lb 3.2 oz 263 lb 12.8 oz 265 lb 12.8 oz  Weight (kg) 119.84 kg 119.659 kg 120.566 kg      Telemetry    SB 1st degree AVblock, 49-50's overnight, 50's this AM though increases to 70's when she is moving around and talking with me- Personally Reviewed  ECG    SB 52bpm, 1st degree AVblock, RBBB, my measurement - Personally Reviewed  Physical Exam   GEN: No acute distress.   Neck: No JVD Cardiac: RRR, extrasystoles, no murmurs, rubs, or gallops.  Respiratory: CTA b/l. GI: Soft, nontender, non-distended, obese MS: No edema; No deformity. Neuro:  Nonfocal  Psych: Normal affect   Labs    High Sensitivity Troponin:  No results for input(s): TROPONINIHS in the last 720 hours.     Chemistry Recent Labs  Lab 08/27/19 1126 08/27/19 1126 08/28/19 0416 08/28/19 1314 08/29/19 0251  NA 143  --  142  --  141  K 4.5   < > 3.4* 4.4 4.4  CL 107  --  105  --  107  CO2 25  --  27  --  26  GLUCOSE 93  --  116*  --  110*  BUN 21  --  18  --  22  CREATININE 0.89  --  0.73  --  0.94  CALCIUM 9.3  --  9.0  --  9.1  GFRNONAA >60  --  >60  --  >60  GFRAA >60  --  >60  --  >60  ANIONGAP 11  --  10  --  8   < > = values in this interval not displayed.     HematologyNo results for input(s): WBC, RBC, HGB, HCT, MCV, MCH, MCHC, RDW, PLT in the last 168 hours.  BNPNo results for input(s): BNP, PROBNP in the last 168 hours.   DDimer No results for input(s): DDIMER in the last 168 hours.   Radiology    No results found.  Cardiac Studies   Echo 07/01/19 demonstrated  1. Left ventricular ejection fraction, by  visual estimation, is 60 to  65%. The left ventricle has normal function. There is mildly increased  left ventricular hypertrophy.  2. Left ventricular diastolic parameters are indeterminate.  3. The left ventricle has no regional wall motion abnormalities.  4. Global right ventricle has normal systolic function.The right  ventricular size is normal. No increase in right ventricular wall  thickness.  5. Left atrial size was moderately dilated.  6. Right atrial size was normal.  7. The mitral valve is normal in structure. Trivial mitral valve  regurgitation. No evidence of mitral stenosis.  8. The tricuspid valve is normal in structure.  9. The tricuspid valve is normal in structure. Tricuspid valve  regurgitation is not demonstrated.  10. The aortic valve is tricuspid. Aortic valve regurgitation is not  visualized. Mild to moderate aortic valve sclerosis/calcification without  any evidence of aortic stenosis.  11. Calcified non coronary cusp.  12. The pulmonic valve was normal in structure. Pulmonic valve  regurgitation is not visualized.  13. The  inferior vena cava is normal in size with greater than 50%  respiratory variability, suggesting right atrial pressure of 3 mmHg.   Patient Profile     64 y.o. female w/PMHx of bipolar disorder, HTN, obesity, and persistent AFib admitted for Tikosyn initiation  Assessment & Plan    1. Persistent AFib     CHADs2vasc is 2 (including gender), on Eliquis, appropriately dosed     K+ 4.4     Mag 2.0     Creat 0.94      Last SR EKG is from 2017, she has had some lengthening of her QT from her admission, though her HR also much slower. I will review with Dr. Rayann Heman prior to this AM dose  Converted to SB last night 49-50's overnight, 50's this AM Will reduce her metoprolol in 1/2 today   Anticipate discharge tomorrow     2. HTN     I have reduced her metoprolol      For questions or updates, please contact [place] Please consult [email or web address] for contact info under        Signed, Baldwin Jamaica, PA-C  08/29/2019, 8:05 AM    I have seen, examined the patient, and reviewed the above assessment and plan.  Changes to above are made where necessary.  On exam,i RRR.  Doing well with tikosyn load.  We discussed at length today.  Continue current appraoch.  Reduce metoprolol due to bradycardia.  Co Sign: Thompson Grayer, MD

## 2019-08-29 NOTE — Progress Notes (Addendum)
Post dose EKG is reviewed, SB 43bpm, 1st degree AVblock, QTc is OK  RN notified of some junctional on the monitor, follow UP EKGs are SB, though tele reviewed with intermittenpt periods of junctional rhythm, same BBB, rates 48-50's I saw the patient, she is asymptomatic, eating lunch and visiting with her daughter  Stopped metoprolol, made Dr. Rayann Heman aware Expect this to resolve off BB Tikosyn will not worsen her bradycardia and we will continue load  BP's here have been 90's-low 100s I have reduced her lisinopril with hold parameter, as well, she tells me that her usual BP when checked at the doctor is usually low 100's  Continue to monitor  Tommye Standard, PAc

## 2019-08-29 NOTE — Progress Notes (Signed)
Patients HR dropped to 40-50s,obtained EKG showed sinus bradycardia with 1st degree AV block. Will continue to monitor patient.

## 2019-08-30 LAB — BASIC METABOLIC PANEL
Anion gap: 11 (ref 5–15)
BUN: 18 mg/dL (ref 8–23)
CO2: 24 mmol/L (ref 22–32)
Calcium: 9.1 mg/dL (ref 8.9–10.3)
Chloride: 105 mmol/L (ref 98–111)
Creatinine, Ser: 0.75 mg/dL (ref 0.44–1.00)
GFR calc Af Amer: >60 mL/min (ref >60)
GFR calc non Af Amer: >60 mL/min (ref >60)
Glucose, Bld: 110 mg/dL — ABNORMAL HIGH (ref 70–99)
Potassium: 4.3 mmol/L (ref 3.5–5.1)
Sodium: 140 mmol/L (ref 135–145)

## 2019-08-30 LAB — MAGNESIUM: Magnesium: 1.9 mg/dL (ref 1.7–2.4)

## 2019-08-30 MED ORDER — DOFETILIDE 500 MCG PO CAPS: 500.0000 ug | ORAL_CAPSULE | Freq: Two times a day (BID) | ORAL | 6 refills | Status: DC

## 2019-08-30 MED ORDER — LISINOPRIL 5 MG PO TABS: 5.0000 mg | ORAL_TABLET | Freq: Every day | ORAL | 6 refills | Status: DC

## 2019-08-30 MED ORDER — MAGNESIUM SULFATE 2 GM/50ML IV SOLN
2.0000 g | Freq: Once | INTRAVENOUS | Status: AC
  Administered 2019-08-30: 2 g via INTRAVENOUS
  Filled 2019-08-30: qty 50

## 2019-08-30 MED FILL — LISINOPRIL 5 MG TABS: 5 | 30 days supply | Qty: 30 | Fill #0

## 2019-08-30 MED FILL — DOFETILIDE 500 MCG CAPS: 500 | 30 days supply | Qty: 60 | Fill #0

## 2019-08-30 NOTE — Discharge Summary (Addendum)
ELECTROPHYSIOLOGY PROCEDURE DISCHARGE SUMMARY    Patient ID: Mary Mcfarland,  MRN: [id number], DOB/AGE: [birth year] 64 y.o.  Admit date: 08/27/2019 Discharge date: 08/30/2019  Primary Care Physician: Lawerance Cruel, MD Primary Cardiologist: Dr. Gwenlyn Found Electrophysiologist: Dr. Rayann Heman  Primary Discharge Diagnosis:  1.  Persistent atrial fibrillation status post Tikosyn loading this admission      CHA2DS2Vasc is 2 (with gender), on Eliquis, appropriately dosed  Secondary Discharge Diagnosis:  1. HTN 2. Bipolar disorder 3. obesity  Allergies  Allergen Reactions  . Niacin Hives  . Horse-Derived Products Swelling and Other (See Comments)    Eyes swell      Procedures This Admission:  1.  Tikosyn loading   Brief HPI: Mary Mcfarland is a 64 y.o. female with a past medical history as noted above.  She is followed in the outpatient setting for treatment of atrial fibrillation.  Risks, benefits, and alternatives to Tikosyn were reviewed with the patient who wished to proceed.    Hospital Course:  The patient was admitted and Tikosyn was initiated.  Renal function and electrolytes were followed during the hospitalization.  Her QTc remained stable.  She converted with drug and did not required DCCV.  She was monitored until discharge on telemetry which demonstrated some transient accelerated junctional rhythm that resolved with stopping her metoprolol, otherwise SR, 1st degree AV block, rates 50's-70's, PACs.  On the day of discharge she feels well, was examined by Dr Rayann Heman who considered the patient stable for discharge to home.  Follow-up has been arranged with the Afib clinic in 1 week and with Dr Rayann Heman in 4 weeks.   Her electrolytes are stable Only once did her K+ drop < 4.0 and chronically her K+ appears stable, Mag was never < 1.9 No additional replacement felt required  BP here has ben 90's-low 100's, will discharge on lower lisinopril dose, discussed with  the patient to monitor her BP at home  Tikosyn education was done with the patient   Physical Exam: Vitals:   08/29/19 0958 08/29/19 1700 08/29/19 2050 08/30/19 0401  BP: (!) 97/49 (!) 135/59 124/65 (!) 105/59  Pulse: 69 (!) 58 75 80  Resp:  18 20 18   Temp:  98.1 F (36.7 C) 98 F (36.7 C) 98.1 F (36.7 C)  TempSrc:  Oral Oral Oral  SpO2:  100% 99% 98%  Weight:    120.4 kg    GEN- The patient is well appearing, alert and oriented x 3 today.   HEENT: normocephalic, atraumatic; sclera clear, conjunctiva pink; hearing intact; oropharynx clear; neck supple, no JVP Lymph- no cervical lymphadenopathy Lungs- CTA b/l, normal work of breathing.  No wheezes, rales, rhonchi Heart- RRR, no murmurs, rubs or gallops, PMI not laterally displaced GI- soft, non-tender, non-distended, obese Extremities- no clubbing, cyanosis, or edema MS- no significant deformity or atrophy Skin- warm and dry, no rash or lesion Psych- euthymic mood, full affect Neuro- strength and sensation are intact   Labs:   Lab Results  Component Value Date   WBC 10.5 07/19/2019   HGB 14.2 07/19/2019   HCT 42.6 07/19/2019   MCV 87 07/19/2019   PLT 301 07/19/2019    Recent Labs  Lab 08/30/19 0252  NA 140  K 4.3  CL 105  CO2 24  BUN 18  CREATININE 0.75  CALCIUM 9.1  GLUCOSE 110*     Discharge Medications:  Allergies as of 08/30/2019      Reactions   Niacin Hives  Horse-derived Products Swelling, Other (See Comments)   Eyes swell       Medication List    STOP taking these medications   metoprolol tartrate 25 MG tablet Commonly known as: LOPRESSOR     TAKE these medications   albuterol 108 (90 Base) MCG/ACT inhaler Commonly known as: VENTOLIN HFA Inhale 2 puffs into the lungs every 6 (six) hours as needed for wheezing or shortness of breath.   apixaban 5 MG Tabs tablet Commonly known as: Eliquis Take 1 tablet (5 mg total) by mouth 2 (two) times daily.   atorvastatin 40 MG  tablet Commonly known as: LIPITOR Take 1 tablet (40 mg total) by mouth daily.   buPROPion 150 MG 12 hr tablet Commonly known as: WELLBUTRIN SR Take 300 mg by mouth daily.   dofetilide 500 MCG capsule Commonly known as: TIKOSYN Take 1 capsule (500 mcg total) by mouth 2 (two) times daily.   ibuprofen 800 MG tablet Commonly known as: ADVIL Take 800 mg by mouth at bedtime as needed (for pain).   lisinopril 5 MG tablet Commonly known as: ZESTRIL Take 1 tablet (5 mg total) by mouth daily. Start taking on: August 31, 2019 What changed:   medication strength  how much to take   omeprazole 20 MG capsule Commonly known as: PRILOSEC Take 20 mg by mouth daily before breakfast.   Opcon-A 0.027-0.315 % Soln Generic drug: Naphazoline-Pheniramine Place 1-2 drops into both eyes 3 (three) times daily as needed (for allergies).   ZzzQuil 25 MG Caps Generic drug: diphenhydrAMINE HCl (Sleep) Take 25 mg by mouth at bedtime as needed (for sleep).       Disposition:  Home    Duration of Discharge Encounter: Greater than 30 minutes including physician time.  Signed, Tommye Standard, PA-C 08/30/2019 11:19 AM  I have seen, examined the patient, and reviewed the above assessment and plan.  Changes to above are made where necessary.  On exam, RRR.  She did well with tikosyn load.  Discharge to home with close outpatient follow-up.  Co Sign: Thompson Grayer, MD

## 2019-08-30 NOTE — Discharge Instructions (Addendum)
Dofetilide capsules What is this medicine? DOFETILIDE (doe FET il ide) is an antiarrhythmic drug. It helps make your heart beat regularly. This medicine also helps to slow rapid heartbeats. This medicine may be used for other purposes; ask your health care provider or pharmacist if you have questions. COMMON BRAND NAME(S): Tikosyn What should I tell my health care provider before I take this medicine? They need to know if you have any of these conditions:  heart disease  history of irregular heartbeat  history of low levels of potassium or magnesium in the blood  kidney disease  liver disease  an unusual or allergic reaction to dofetilide, other medicines, foods, dyes, or preservatives  pregnant or trying to get pregnant  breast-feeding How should I use this medicine? Take this medicine by mouth with a glass of water. Follow the directions on the prescription label. Do not take with grapefruit juice. You can take it with or without food. If it upsets your stomach, take it with food. Take your medicine at regular intervals. Do not take it more often than directed. Do not stop taking except on your doctor's advice. A special MedGuide will be given to you by the pharmacist with each prescription and refill. Be sure to read this information carefully each time. Talk to your pediatrician regarding the use of this medicine in children. Special care may be needed. Overdosage: If you think you have taken too much of this medicine contact a poison control center or emergency room at once. NOTE: This medicine is only for you. Do not share this medicine with others. What if I miss a dose? If you miss a dose, skip it. Take your next dose at the normal time. Do not take extra or 2 doses at the same time to make up for the missed dose. What may interact with this medicine? Do not take this medicine with any of the following  medications:  cimetidine  cisapride  dolutegravir  dronedarone  hydrochlorothiazide  ketoconazole  megestrol  pimozide  prochlorperazine  thioridazine  trimethoprim  verapamil This medicine may also interact with the following medications:  amiloride  cannabinoids  certain antibiotics like erythromycin or clarithromycin  certain antiviral medicines for HIV or hepatitis  certain medicines for depression, anxiety, or psychotic disorders  digoxin  diltiazem  grapefruit juice  metformin  nefazodone  other medicines that prolong the QT interval (an abnormal heart rhythm)  quinine  triamterene  zafirlukast  ziprasidone This list may not describe all possible interactions. Give your health care provider a list of all the medicines, herbs, non-prescription drugs, or dietary supplements you use. Also tell them if you smoke, drink alcohol, or use illegal drugs. Some items may interact with your medicine. What should I watch for while using this medicine? Your condition will be monitored carefully while you are receiving this medicine. What side effects may I notice from receiving this medicine? Side effects that you should report to your doctor or health care professional as soon as possible:  allergic reactions like skin rash, itching or hives, swelling of the face, lips, or tongue  breathing problems  chest pain or chest tightness  dizziness  signs and symptoms of a dangerous change in heartbeat or heart rhythm like chest pain; dizziness; fast or irregular heartbeat; palpitations; feeling faint or lightheaded, falls; breathing problems  signs and symptoms of electrolyte imbalance like severe diarrhea, unusual sweating, vomiting, loss of appetite, increased thirst  swelling of the ankles, legs, or feet  tingling,   numbness in the hands or feet Side effects that usually do not require medical attention (report to your doctor or health care  professional if they continue or are bothersome):  diarrhea  general ill feeling or flu-like symptoms  headache  nausea  trouble sleeping  stomach pain This list may not describe all possible side effects. Call your doctor for medical advice about side effects. You may report side effects to FDA at 1-800-FDA-1088. Where should I keep my medicine? Keep out of the reach of children. Store at room temperature between 15 and 30 degrees C (59 and 86 degrees F). Throw away any unused medicine after the expiration date. NOTE: This sheet is a summary. It may not cover all possible information. If you have questions about this medicine, talk to your doctor, pharmacist, or health care provider.  2020 Elsevier/Gold Standard (2018-05-21 10:18:48)   Atrial Fibrillation  Atrial fibrillation is a type of heartbeat that is irregular or fast. If you have this condition, your heart beats without any order. This makes it hard for your heart to pump blood in a normal way. Atrial fibrillation may come and go, or it may become a long-lasting problem. If this condition is not treated, it can put you at higher risk for stroke, heart failure, and other heart problems. What are the causes? This condition may be caused by diseases that damage the heart. They include:  High blood pressure.  Heart failure.  Heart valve disease.  Heart surgery. Other causes include:  Diabetes.  Thyroid disease.  Being overweight.  Kidney disease. Sometimes the cause is not known. What increases the risk? You are more likely to develop this condition if:  You are older.  You smoke.  You exercise often and very hard.  You have a family history of this condition.  You are a man.  You use drugs.  You drink a lot of alcohol.  You have lung conditions, such as emphysema, pneumonia, or COPD.  You have sleep apnea. What are the signs or symptoms? Common symptoms of this condition include:  A feeling  that your heart is beating very fast.  Chest pain or discomfort.  Feeling short of breath.  Suddenly feeling light-headed or weak.  Getting tired easily during activity.  Fainting.  Sweating. In some cases, there are no symptoms. How is this treated? Treatment for this condition depends on underlying conditions and how you feel when you have atrial fibrillation. They include:  Medicines to: ? Prevent blood clots. ? Treat heart rate or heart rhythm problems.  Using devices, such as a pacemaker, to correct heart rhythm problems.  Doing surgery to remove the part of the heart that sends bad signals.  Closing an area where clots can form in the heart (left atrial appendage). In some cases, your doctor will treat other underlying conditions. Follow these instructions at home: Medicines  Take over-the-counter and prescription medicines only as told by your doctor.  Do not take any new medicines without first talking to your doctor.  If you are taking blood thinners: ? Talk with your doctor before you take any medicines that have aspirin or NSAIDs, such as ibuprofen, in them. ? Take your medicine exactly as told by your doctor. Take it at the same time each day. ? Avoid activities that could hurt or bruise you. Follow instructions about how to prevent falls. ? Wear a bracelet that says you are taking blood thinners. Or, carry a card that lists what medicines you take.   Lifestyle      Do not use any products that have nicotine or tobacco in them. These include cigarettes, e-cigarettes, and chewing tobacco. If you need help quitting, ask your doctor.  Eat heart-healthy foods. Talk with your doctor about the right eating plan for you.  Exercise regularly as told by your doctor.  Do not drink alcohol.  Lose weight if you are overweight.  Do not use drugs, including cannabis. General instructions  If you have a condition that causes breathing to stop for a short period of  time (apnea), treat it as told by your doctor.  Keep a healthy weight. Do not use diet pills unless your doctor says they are safe for you. Diet pills may make heart problems worse.  Keep all follow-up visits as told by your doctor. This is important. Contact a doctor if:  You notice a change in the speed, rhythm, or strength of your heartbeat.  You are taking a blood-thinning medicine and you get more bruising.  You get tired more easily when you move or exercise.  You have a sudden change in weight. Get help right away if:   You have pain in your chest or your belly (abdomen).  You have trouble breathing.  You have side effects of blood thinners, such as blood in your vomit, poop (stool), or pee (urine), or bleeding that cannot stop.  You have any signs of a stroke. "BE FAST" is an easy way to remember the main warning signs: ? B - Balance. Signs are dizziness, sudden trouble walking, or loss of balance. ? E - Eyes. Signs are trouble seeing or a change in how you see. ? F - Face. Signs are sudden weakness or loss of feeling in the face, or the face or eyelid drooping on one side. ? A - Arms. Signs are weakness or loss of feeling in an arm. This happens suddenly and usually on one side of the body. ? S - Speech. Signs are sudden trouble speaking, slurred speech, or trouble understanding what people say. ? T - Time. Time to call emergency services. Write down what time symptoms started.  You have other signs of a stroke, such as: ? A sudden, very bad headache with no known cause. ? Feeling like you may vomit (nausea). ? Vomiting. ? A seizure. These symptoms may be an emergency. Do not wait to see if the symptoms will go away. Get medical help right away. Call your local emergency services (911 in the [place] Do not drive yourself to the hospital. Summary  Atrial fibrillation is a type of heartbeat that is irregular or fast.  You are at higher risk of this condition if you  smoke, are older, have diabetes, or are overweight.  Follow your doctor's instructions about medicines, diet, exercise, and follow-up visits.  Get help right away if you have signs or symptoms of a stroke.  Get help right away if you cannot catch your breath, or you have chest pain or discomfort. This information is not intended to replace advice given to you by your health care provider. Make sure you discuss any questions you have with your health care provider. Document Revised: 11/21/2018 Document Reviewed: 11/21/2018 Elsevier Patient Education  2020 Elsevier Inc.  

## 2019-08-30 NOTE — Progress Notes (Signed)
Pharmacy: Dofetilide (Tikosyn) - Follow Up Assessment and Electrolyte Replacement  Pharmacy consulted to assist in monitoring and replacing electrolytes in this 64 y.o. female admitted on 08/27/2019 undergoing dofetilide initiation. First dofetilide dose: 531mcg q12h was 3/16 2000.  Labs:    Component Value Date/Time   K 4.3 08/30/2019 0252   MG 1.9 08/30/2019 0252     Plan: Potassium: K >/= 4: No additional supplementation needed  Magnesium: Mg 1.8-2: Give Mg 2 gm IV x1     Thank you for allowing pharmacy to participate in this patient's care   Arrie Senate, PharmD, BCPS Clinical Pharmacist [phone] Please check AMION for all [facility] numbers 08/30/2019

## 2019-09-05 ENCOUNTER — Telehealth (HOSPITAL_COMMUNITY): Payer: Self-pay | Admitting: *Deleted

## 2019-09-05 NOTE — Telephone Encounter (Signed)
Patient having elevated BP since leaving hospital. Lisinopril dosing was reduce in hospital due to hypotension plus HCTZ stopped due to tikosyn start. BP 152-172/88-92. Per Adline Peals increase lisinopril 10mg  once a day until visit tomorrow then will address further. Pt verbalized understanding.

## 2019-09-06 ENCOUNTER — Telehealth: Payer: Self-pay | Admitting: Internal Medicine

## 2019-09-06 ENCOUNTER — Ambulatory Visit (HOSPITAL_COMMUNITY)
Admit: 2019-09-06 | Discharge: 2019-09-06 | Disposition: A | Discharge status: Home / Self Care | Payer: BLUE CROSS/BLUE SHIELD | Source: Ambulatory Visit | Attending: Physician Assistant | Admitting: Physician Assistant

## 2019-09-06 ENCOUNTER — Other Ambulatory Visit: Payer: Self-pay

## 2019-09-06 VITALS — BP 148/80 | HR 93 | Ht 66.0 in | Wt 266.4 lb

## 2019-09-06 DIAGNOSIS — Z79899 Other long term (current) drug therapy: Secondary | ICD-10-CM | POA: Insufficient documentation

## 2019-09-06 DIAGNOSIS — Z6841 Body Mass Index (BMI) 40.0 and over, adult: Secondary | ICD-10-CM | POA: Insufficient documentation

## 2019-09-06 DIAGNOSIS — I10 Essential (primary) hypertension: Secondary | ICD-10-CM | POA: Diagnosis not present

## 2019-09-06 DIAGNOSIS — J45909 Unspecified asthma, uncomplicated: Secondary | ICD-10-CM | POA: Diagnosis not present

## 2019-09-06 DIAGNOSIS — E669 Obesity, unspecified: Secondary | ICD-10-CM | POA: Diagnosis not present

## 2019-09-06 DIAGNOSIS — Z87891 Personal history of nicotine dependence: Secondary | ICD-10-CM | POA: Diagnosis not present

## 2019-09-06 DIAGNOSIS — Z7901 Long term (current) use of anticoagulants: Secondary | ICD-10-CM | POA: Diagnosis not present

## 2019-09-06 DIAGNOSIS — F319 Bipolar disorder, unspecified: Secondary | ICD-10-CM | POA: Diagnosis not present

## 2019-09-06 DIAGNOSIS — I4819 Other persistent atrial fibrillation: Secondary | ICD-10-CM

## 2019-09-06 DIAGNOSIS — Z8249 Family history of ischemic heart disease and other diseases of the circulatory system: Secondary | ICD-10-CM | POA: Insufficient documentation

## 2019-09-06 LAB — BASIC METABOLIC PANEL
Anion gap: 11 (ref 5–15)
BUN: 15 mg/dL (ref 8–23)
CO2: 24 mmol/L (ref 22–32)
Calcium: 9.4 mg/dL (ref 8.9–10.3)
Chloride: 108 mmol/L (ref 98–111)
Creatinine, Ser: 0.64 mg/dL (ref 0.44–1.00)
GFR calc Af Amer: >60 mL/min (ref >60)
GFR calc non Af Amer: >60 mL/min (ref >60)
Glucose, Bld: 97 mg/dL (ref 70–99)
Potassium: 4.1 mmol/L (ref 3.5–5.1)
Sodium: 143 mmol/L (ref 135–145)

## 2019-09-06 LAB — MAGNESIUM: Magnesium: 1.9 mg/dL (ref 1.7–2.4)

## 2019-09-06 MED ORDER — DOFETILIDE 250 MCG PO CAPS: 250.0000 ug | ORAL_CAPSULE | Freq: Two times a day (BID) | ORAL | 2 refills | Status: DC

## 2019-09-06 MED ORDER — LISINOPRIL 10 MG PO TABS: 10.0000 mg | ORAL_TABLET | Freq: Every day | ORAL | 3 refills | Status: DC

## 2019-09-06 NOTE — Progress Notes (Signed)
Primary Care Physician: Lawerance Cruel, MD Primary Cardiologist: Dr Gwenlyn Found  Primary Electrophysiologist: Dr Rayann Heman Referring Physician: Dr [name] is a 64 y.o. female with a history of HTN, bipolar disorder, and persistent atrial fibrillation who presents for follow up in the [facility]. She was first diagnosed with atrial fibrillation in 2017 and then recurrence found in January of this year at PCP visit. She went in for evaluation of headaches, dizziness.  She was placed on BB and Eliquis.  She underwent attempted cardioversion in February which failed to restore SR. Patient is on Eliquis for a CHADS2VASC score of 3. She was evaluated by Dr Rayann Heman on 08/09/19 who recommended dofetilide.   On follow up today, patient is s/p dofetilide loading. She reports that she has done well since her hospitalization with no episodes of afib. She did note some high BP readings since her HCTZ and BB were stopped. She is tolerating the medication without difficulty. She did not require DCCV.  Today, she denies symptoms of palpitations, chest pain, shortness of breath, orthopnea, PND, lower extremity edema, dizziness, presyncope, syncope, snoring, daytime somnolence, bleeding, or neurologic sequela. The patient is tolerating medications without difficulties and is otherwise without complaint today.    Atrial Fibrillation Risk Factors:  she does have symptoms or diagnosis of sleep apnea. Sleep study pending. she does not have a history of rheumatic fever. she does not have a history of alcohol use.   she has a BMI of Body mass index is 43 kg/m.Marland Kitchen Filed Weights   09/06/19 1116  Weight: 120.8 kg    Family History  Problem Relation Age of Onset  . Heart disease Mother   . CVA Mother   . Hypertension Mother   . Cancer Mother   . Heart disease Father   . Hypertension Father   . Heart attack Father      Atrial Fibrillation Management  history:  Previous antiarrhythmic drugs: dofetilide Previous cardioversions: 07/24/19 Previous ablations: none CHADS2VASC score: 2 Anticoagulation history: Eliquis   Past Medical History:  Diagnosis Date  . Asthma   . Bipolar disorder ([place])   . Difficult intubation    Narrow airway  . Hypertension   . Longstanding persistent atrial fibrillation ([place])   . Sleep apnea    sleep study was "inconclusive" per patient  . Visit for monitoring Tikosyn therapy 08/27/2019   Past Surgical History:  Procedure Laterality Date  . bone spur surgery    . CARDIOVERSION N/A 07/24/2019   Procedure: CARDIOVERSION;  Surgeon: Buford Dresser, MD;  Location: [facility] ENDOSCOPY;  Service: Cardiovascular;  Laterality: N/A;  . COLONOSCOPY    . HEEL SPUR RESECTION Left 03/15/2016   Procedure: ACHILLIES TENDON REPAIR AND PUMP BUMP EXCISION;  Surgeon: Melrose Nakayama, MD;  Location: [facility];  Service: Orthopedics;  Laterality: Left;  . TONSILLECTOMY      Current Outpatient Medications  Medication Sig Dispense Refill  . albuterol (PROVENTIL HFA;VENTOLIN HFA) 108 (90 Base) MCG/ACT inhaler Inhale 2 puffs into the lungs every 6 (six) hours as needed for wheezing or shortness of breath.    Marland Kitchen apixaban (ELIQUIS) 5 MG TABS tablet Take 1 tablet (5 mg total) by mouth 2 (two) times daily. 180 tablet 1  . atorvastatin (LIPITOR) 40 MG tablet Take 1 tablet (40 mg total) by mouth daily. 90 tablet 3  . buPROPion (WELLBUTRIN SR) 150 MG 12 hr tablet Take 300 mg by mouth daily.    Marland Kitchen dofetilide (  TIKOSYN) 250 MCG capsule Take 1 capsule (250 mcg total) by mouth 2 (two) times daily. 60 capsule 2  . ibuprofen (ADVIL) 800 MG tablet Take 800 mg by mouth at bedtime as needed (for pain).     Marland Kitchen lisinopril (ZESTRIL) 10 MG tablet Take 1 tablet (10 mg total) by mouth daily. 30 tablet 3  . Naphazoline-Pheniramine (OPCON-A) 0.027-0.315 % SOLN Place 1-2 drops into both eyes 3 (three) times daily as needed (for allergies).     Marland Kitchen omeprazole  (PRILOSEC) 20 MG capsule Take 20 mg by mouth daily before breakfast.      No current facility-administered medications for this encounter.    Allergies  Allergen Reactions  . Niacin Hives  . Horse-Derived Products Swelling and Other (See Comments)    Eyes swell     Social History   Socioeconomic History  . Marital status: Legally Separated    Spouse name: Not on file  . Number of children: Not on file  . Years of education: Not on file  . Highest education level: Not on file  Occupational History  . Not on file  Tobacco Use  . Smoking status: Former Smoker    Packs/day: 1.00    Types: Cigarettes    Quit date: 02/13/1996    Years since quitting: 23.5  . Smokeless tobacco: Never Used  Substance and Sexual Activity  . Alcohol use: Yes    Alcohol/week: 2.0 standard drinks    Types: 2 Shots of liquor per week    Comment: rarely  . Drug use: Never  . Sexual activity: Not on file  Other Topics Concern  . 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 Difficulty of Paying Living Expenses:   Food Insecurity:   . Worried About Charity fundraiser in the Last Year:   . Arboriculturist in the Last Year:   Transportation Needs:   . Film/video editor (Medical):   Marland Kitchen Lack of Transportation (Non-Medical):   Physical Activity:   . Days of Exercise per Week:   . Minutes of Exercise per Session:   Stress:   . Feeling of Stress :   Social Connections:   . Frequency of Communication with Friends and Family:   . Frequency of Social Gatherings with Friends and Family:   . Attends Religious Services:   . Active Member of Clubs or Organizations:   . Attends Archivist Meetings:   Marland Kitchen Marital Status:   Intimate Partner Violence:   . Fear of Current or Ex-Partner:   . Emotionally Abused:   Marland Kitchen Physically Abused:   . Sexually Abused:      ROS-  All systems are reviewed and negative except as per the HPI above.  Physical Exam: Vitals:   09/06/19 1116  BP: (!) 148/80  Pulse: 93  Weight: 120.8 kg  Height: 5\' 6"  (1.676 m)    GEN- The patient is well appearing obese female, alert and oriented x 3 today.   HEENT-head normocephalic, atraumatic, sclera clear, conjunctiva pink, hearing intact, trachea midline. Lungs- Clear to ausculation bilaterally, normal work of breathing Heart- Regular rate and rhythm, no murmurs, rubs or gallops  GI- soft, NT, ND, + BS Extremities- no clubbing, cyanosis, or edema MS- no significant deformity or atrophy Skin- no rash or lesion Psych- euthymic mood, full affect Neuro- strength  and sensation are intact   Wt Readings from Last 3 Encounters:  09/06/19 120.8 kg  08/30/19 120.4 kg  08/27/19 120.6 kg    EKG today demonstrates SR HR 93, 1st degree AV block, RBBB, PR 238, QRS 128, QTc 666 (599 manually calculated)  Echo 07/01/19 demonstrated  1. Left ventricular ejection fraction, by visual estimation, is 60 to  65%. The left ventricle has normal function. There is mildly increased  left ventricular hypertrophy.  2. Left ventricular diastolic parameters are indeterminate.  3. The left ventricle has no regional wall motion abnormalities.  4. Global right ventricle has normal systolic function.The right  ventricular size is normal. No increase in right ventricular wall  thickness.  5. Left atrial size was moderately dilated.  6. Right atrial size was normal.  7. The mitral valve is normal in structure. Trivial mitral valve  regurgitation. No evidence of mitral stenosis.  8. The tricuspid valve is normal in structure.  9. The tricuspid valve is normal in structure. Tricuspid valve  regurgitation is not demonstrated.  10. The aortic valve is tricuspid. Aortic valve regurgitation is not  visualized. Mild to moderate aortic valve sclerosis/calcification without  any evidence of aortic  stenosis.  11. Calcified non coronary cusp.  12. The pulmonic valve was normal in structure. Pulmonic valve  regurgitation is not visualized.  13. The inferior vena cava is normal in size with greater than 50%  respiratory variability, suggesting right atrial pressure of 3 mmHg.   Epic records are reviewed at length today  CHA2DS2-VASc Score = 2 The patient's score is based upon: CHF History: No HTN History: Yes Age : < 65 Diabetes History: No Stroke History: No Vascular Disease History: No Gender: Female    ASSESSMENT AND PLAN: 1. Persistent Atrial Fibrillation (ICD10:  I48.19) The patient's CHA2DS2-VASc score is 2, indicating a 2.2% annual risk of stroke.   S/p dofetilide loading 3/16-3/19/21. Patient appears to be maintaining SR. QT prolonged even accounting for RBBB. Reviewed with Dr Rayann Heman. Patient to hold dofetilide dose tonight. Resume at 250 mcg BID dose starting tomorrow. Recheck ECG next week. Check bmet/mag Continue Eliquis 5 mg BID  2. Obesity Body mass index is 43 kg/m. Lifestyle modification was discussed and encouraged including regular physical activity and weight reduction.  3. Suspected OSA Sleep study pending.  4. HTN Stable, continue lisinopril 10 mg daily.   Follow up next week for ECG then with Dr Rayann Heman and Dr Gwenlyn Found as scheduled.    [facility] [address] [place], [place] [address] [phone] 09/06/2019 2:47 PM

## 2019-09-06 NOTE — Patient Instructions (Signed)
Lisinopril 10 mg a day

## 2019-09-06 NOTE — Telephone Encounter (Signed)
Cardiology Moonlighter Note  Returned page from patient. Checked her BP tonight and was 182/93. States that her lisinopril was decreased from 20mg  to 5mg  at her recent hospitalization. BP yesterday was also elevated to the 170's at an MD appt yesterday. Lisinopril increased to 10mg .   Today she took 10mg  lisinopril in the AM. Currently she denies chest pain/pressure, SOB, dizziness, lightheadedness, syncope, presyncope, blurry vision, weakness, neurologic symptoms, headache. I confirmed with her that when her lisinopril was decreased during her recent hospitalization, it was not because of symptomatic hypotension. She had asymptomatic systolic blood pressures in the 100's at the time.   Advised patient to take another dose of 10mg  tonight and then restart 20mg  lisinopril daily in the AM. She will monitor her blood pressure once again tonight and in the AM. She will call back or come to ED if she develops any symptoms. She has follow up scheduled for Monday. Will cc patient's cardiologist on this note.   Marcie Mowers, MD Cardiology Fellow, PGY-7

## 2019-09-09 ENCOUNTER — Telehealth: Payer: Self-pay | Admitting: Cardiovascular Disease

## 2019-09-09 ENCOUNTER — Other Ambulatory Visit: Payer: Self-pay

## 2019-09-09 ENCOUNTER — Ambulatory Visit (HOSPITAL_COMMUNITY)
Admission: RE | Admit: 2019-09-09 | Discharge: 2019-09-09 | Disposition: A | Discharge status: Home / Self Care | Payer: BLUE CROSS/BLUE SHIELD | Source: Ambulatory Visit | Attending: Physician Assistant | Admitting: Physician Assistant

## 2019-09-09 VITALS — BP 174/94 | HR 84

## 2019-09-09 DIAGNOSIS — I4819 Other persistent atrial fibrillation: Secondary | ICD-10-CM | POA: Diagnosis not present

## 2019-09-09 DIAGNOSIS — I440 Atrioventricular block, first degree: Secondary | ICD-10-CM | POA: Diagnosis not present

## 2019-09-09 DIAGNOSIS — Z79899 Other long term (current) drug therapy: Secondary | ICD-10-CM | POA: Diagnosis not present

## 2019-09-09 DIAGNOSIS — I498 Other specified cardiac arrhythmias: Secondary | ICD-10-CM | POA: Insufficient documentation

## 2019-09-09 DIAGNOSIS — I4510 Unspecified right bundle-branch block: Secondary | ICD-10-CM | POA: Insufficient documentation

## 2019-09-09 NOTE — Progress Notes (Signed)
Patient returns for ECG today on dofetilide. ECG today shows SR HR 84, RBBB, 1st degree AV block, PR 238, QRS 136, QTc 482. Patient reports her BP has been running high and her lisinopril was increased to 20 mg daily on 09/07/19. Discussed that some BP medications can take time to see full effect. Will have her return for BP check in 2 weeks. Continue dofetilide 250 mcg BID.

## 2019-09-09 NOTE — Telephone Encounter (Signed)
Please offer the patient follow up this week in our office with Gwenlyn Found team APP if she would like to discuss further.

## 2019-09-09 NOTE — Addendum Note (Signed)
Encounter addended by: Enid Derry, CMA on: 09/09/2019 2:14 PM  Actions taken: Order Reconciliation Section accessed, Home Medications modified

## 2019-09-09 NOTE — Telephone Encounter (Signed)
Returned call to pt she states that she unable to come 4-1 d/t her insurance, her last day of coverage is 3-31. Rescheduled appt with KK ok per KK to schedule. pt will arrive @ 1130am alone wearing a mask.

## 2019-09-09 NOTE — Telephone Encounter (Signed)
  Pt c/o swelling: STAT is pt has developed SOB within 24 hours  1) How much weight have you gained and in what time span? 3  Lbs in a week  2) If swelling, where is the swelling located? Both feet but left is worst  3) Are you currently taking a fluid pill? No  4) Are you currently SOB? When moving she gets SOB   5) Do you have a log of your daily weights (if so, list)? None  6) Have you gained 3 pounds in a day or 5 pounds in a week? 3 lbs in a week  7) Have you traveled recently? No  Pt stopped taking lisinopril and hydrochlorothiazide since she was on Afib and went to Afib clinic today, she got there and her BP was extremely elevated at 184/92. She also said both of her legs is swollen and her left feet worst that her right. She gets SOB when moving around as well  Please advise

## 2019-09-09 NOTE — Telephone Encounter (Signed)
Pt states that her BP has been running high over the weekend. Pt states that she has had a headache. Pt states that her feet are "very swollen" (her weight at the las visit 263#-^ 3#) she does not have a water pill for the swelling but she states that she would like one. She states that her increase in her lisinopril is not working. (She was told by the AFIB clinic that this will take a few more days to start working.) She would like to know what to do now. She will try to take it easy until call back.

## 2019-09-10 NOTE — Progress Notes (Signed)
Cardiology Office Note   Date:  09/11/2019   ID:  Mary Mcfarland, DOB 1955/08/11, MRN [id number]  PCP:  Lawerance Cruel, MD  Cardiologist:  Quay Burow, MD EP: Thompson Grayer, MD  Chief Complaint  Patient presents with  . Hypertension  . Leg Swelling      History of Present Illness: Mary Mcfarland is a 64 y.o. female with a PMH of persistent atrial fibrillation, HTN, HLD, bipolar disorder, and morbid obesity, who presents for the evaluation of HTN and LE edema.  She was diagnosed with atrial fibrillation in January 2021 with failed cardioversion 07/2019. She was recently admitted 08/27/19 for initiation of tikosyn. Seen outpatient at the Atrial fibrillation clinic 09/06/19 and was maintaining sinus rhythm and was without cardiac complaints. Her QTc was noted to be prolonged on EKG that visit. Tikosyn PM dose held 3/26, then resumed at 250 mcg BID going forward. Surveillance EKG 09/09/19 with improvement in QTc to 480s. She contacted our office 09/09/19 with concerns for elevated blood pressures and LE edema prompting this visit.   Her last echocardiogram 06/2019 showed EF 60-65%, mild LVH, indeterminate LV diastolic function, no RWMA, moderate LAE, and no significant valvular abnormalities. She had a calcium score of 22, placing her in the 75th percentile for age/sex 06/2019. No further ischemic testing performed.   She presents today for evaluation of HTN and LE edema. She reports BP was well controlled and she did not struggle with LE edema prior to being taken off HCTZ given potential interaction with tikosyn. BP has frequently been in the 170s/80s-90s since that time. She reported her lisinopril was decreased to 5mg  daily during her hospitalization for tikosyn initiation, increased back to previously dosed 20mg  daily 09/06/19.  Over the past week she has noticed progressive LE edema and weight gain of 5 lbs. She came down with a cold after discharge from the hospital with  congestion/cough but no fevers. She has only taken OTC guaifenesin in an effort to avoid medications that may increase her blood pressure further. She notes some SOB but no significant orthopnea or PND. She denies chest pain, dizziness, lightheadedness, syncope, headaches, or bleeding with eliquis.    She presents today for follow-up of her HTN and swelling. Lasix 80mg  daily x 3 days then 40mg  daily going forward. 20 mEq potassium with 80mg  daily dosing. Continue lisinopril 20mg  daily BMET in 1 week F/u in person or virtual in 2 weeks Continue to keep a log of her BP and encouraged daily weights    Past Medical History:  Diagnosis Date  . Asthma   . Bipolar disorder ([place])   . Difficult intubation    Narrow airway  . Hypertension   . Longstanding persistent atrial fibrillation ([place])   . Sleep apnea    sleep study was "inconclusive" per patient  . Visit for monitoring Tikosyn therapy 08/27/2019    Past Surgical History:  Procedure Laterality Date  . bone spur surgery    . CARDIOVERSION N/A 07/24/2019   Procedure: CARDIOVERSION;  Surgeon: Buford Dresser, MD;  Location: [facility] ENDOSCOPY;  Service: Cardiovascular;  Laterality: N/A;  . COLONOSCOPY    . HEEL SPUR RESECTION Left 03/15/2016   Procedure: ACHILLIES TENDON REPAIR AND PUMP BUMP EXCISION;  Surgeon: Melrose Nakayama, MD;  Location: [place];  Service: Orthopedics;  Laterality: Left;  . TONSILLECTOMY       Current Outpatient Medications  Medication Sig Dispense Refill  . albuterol (PROVENTIL HFA;VENTOLIN HFA) 108 (90 Base) MCG/ACT inhaler  Inhale 2 puffs into the lungs every 6 (six) hours as needed for wheezing or shortness of breath.    Marland Kitchen apixaban (ELIQUIS) 5 MG TABS tablet Take 1 tablet (5 mg total) by mouth 2 (two) times daily. 180 tablet 1  . atorvastatin (LIPITOR) 40 MG tablet Take 1 tablet (40 mg total) by mouth daily. 90 tablet 3  . buPROPion (WELLBUTRIN SR) 150 MG 12 hr tablet Take 300 mg by mouth daily.    Marland Kitchen dofetilide  (TIKOSYN) 250 MCG capsule Take 1 capsule (250 mcg total) by mouth 2 (two) times daily. 60 capsule 2  . ibuprofen (ADVIL) 800 MG tablet Take 800 mg by mouth at bedtime as needed (for pain).     Marland Kitchen lisinopril (ZESTRIL) 20 MG tablet Take 20 mg by mouth daily.    . Naphazoline-Pheniramine (OPCON-A) 0.027-0.315 % SOLN Place 1-2 drops into both eyes 3 (three) times daily as needed (for allergies).     Marland Kitchen omeprazole (PRILOSEC) 20 MG capsule Take 20 mg by mouth daily before breakfast.     . furosemide (LASIX) 40 MG tablet Take 1 tablet (40 mg total) by mouth daily. 90 tablet 1  . potassium chloride SA (KLOR-CON M20) 20 MEQ tablet Take 1 tablet (20 mEq total) by mouth daily. TAKE FOR 3 DAYS THEN STOP 30 tablet 0   No current facility-administered medications for this visit.    Allergies:   Niacin and Horse-derived products    Social History:  The patient  reports that she quit smoking about 23 years ago. Her smoking use included cigarettes. She smoked 1.00 pack per day. She has never used smokeless tobacco. She reports current alcohol use of about 2.0 standard drinks of alcohol per week. She reports that she does not use drugs.   Family History:  The patient's family history includes CVA in her mother; Cancer in her mother; Heart attack in her father; Heart disease in her father and mother; Hypertension in her father and mother.    ROS:  Please see the history of present illness.   Otherwise, review of systems are positive for none.   All other systems are reviewed and negative.    PHYSICAL EXAM: VS:  BP (!) 158/92   Pulse 87   Ht 5\' 6"  (1.676 m)   Wt 271 lb (122.9 kg)   LMP 09/24/2013   SpO2 97%   BMI 43.74 kg/m  , BMI Body mass index is 43.74 kg/m. GEN: Well nourished, well developed, in no acute distress HEENT: normal Neck: no JVD, carotid bruits, or masses Cardiac: RRR; no murmurs, rubs, or gallops, non-pitting edema of b/l LE.   Respiratory:  clear to auscultation bilaterally, normal  work of breathing GI: soft, nontender, nondistended, + BS MS: no deformity or atrophy Skin: warm and dry, no rash Neuro:  Strength and sensation are intact Psych: euthymic mood, full affect   EKG:  EKG is not ordered today.   Recent Labs: 06/24/2019: ALT 20; TSH 2.490 07/19/2019: Hemoglobin 14.2; Platelets 301 09/06/2019: BUN 15; Creatinine, Ser 0.64; Magnesium 1.9; Potassium 4.1; Sodium 143    Lipid Panel    Component Value Date/Time   CHOL 209 (H) 06/24/2019 0903   TRIG 116 06/24/2019 0903   HDL 57 06/24/2019 0903   CHOLHDL 3.7 06/24/2019 0903   LDLCALC 131 (H) 06/24/2019 0903      Wt Readings from Last 3 Encounters:  09/11/19 271 lb (122.9 kg)  09/06/19 266 lb 6.4 oz (120.8 kg)  08/30/19 265 lb  6.9 oz (120.4 kg)      Other studies Reviewed: Additional studies/ records that were reviewed today include:   Echocardiogram 06/2019: 1. Left ventricular ejection fraction, by visual estimation, is 60 to  65%. The left ventricle has normal function. There is mildly increased  left ventricular hypertrophy.  2. Left ventricular diastolic parameters are indeterminate.  3. The left ventricle has no regional wall motion abnormalities.  4. Global right ventricle has normal systolic function.The right  ventricular size is normal. No increase in right ventricular wall  thickness.  5. Left atrial size was moderately dilated.  6. Right atrial size was normal.  7. The mitral valve is normal in structure. Trivial mitral valve  regurgitation. No evidence of mitral stenosis.  8. The tricuspid valve is normal in structure.  9. The tricuspid valve is normal in structure. Tricuspid valve  regurgitation is not demonstrated.  10. The aortic valve is tricuspid. Aortic valve regurgitation is not  visualized. Mild to moderate aortic valve sclerosis/calcification without  any evidence of aortic stenosis.  11. Calcified non coronary cusp.  12. The pulmonic valve was normal in  structure. Pulmonic valve  regurgitation is not visualized.  13. The inferior vena cava is normal in size with greater than 50%  respiratory variability, suggesting right atrial pressure of 3 mmHg.     ASSESSMENT AND PLAN:  1. HTN: BP 152/92 today, a downward trend from 170s/80s-90s a couple days ago. Hopeful this will continue to improved.  - Continue lisinopril 20mg  daily for now - Will start lasix as below  2. LE edema: suspect a component of diastolic CHF that was previously managed with HCTZ. Now with non-pitting LE edema and weight gain of 5lbs in 1 week.  - Will start lasix - plan for 80mg  daily x 3 days with K 20 mEq daily x3 days, then start lasix 40mg  daily going forward.  - Encouraged daily weights and low sodium diet going forward.  - Will check a BMET in 1 week for close monitoring of her kidney and electrolyte function.   3. Paroxysmal atrial fibrillation: maintaining sinus rhythm on Tikosyn - Continue tikosyn for rhythm control - Continue eliquis for stroke ppx - Continue follow-up with the Afib clinic  4. HLD: LDL 131 06/2019 - Continue atorvastatin - Will update FLP/LFTs at follow-up visit for close monitoring.     Current medicines are reviewed at length with the patient today.  The patient does not have concerns regarding medicines.  The following changes have been made:  As above  Labs/ tests ordered today include:   Orders Placed This Encounter  Procedures  . Basic Metabolic Panel (BMET)     Disposition:   FU with me in 2 weeks  Signed, Abigail Butts, PA-C  09/11/2019 1:23 PM

## 2019-09-11 ENCOUNTER — Encounter: Payer: Self-pay | Admitting: Medical

## 2019-09-11 ENCOUNTER — Other Ambulatory Visit: Payer: Self-pay

## 2019-09-11 ENCOUNTER — Ambulatory Visit: Payer: BLUE CROSS/BLUE SHIELD | Admitting: Medical

## 2019-09-11 VITALS — BP 158/92 | HR 87 | Ht 66.0 in | Wt 271.0 lb

## 2019-09-11 DIAGNOSIS — I480 Paroxysmal atrial fibrillation: Secondary | ICD-10-CM | POA: Diagnosis not present

## 2019-09-11 DIAGNOSIS — R600 Localized edema: Secondary | ICD-10-CM | POA: Diagnosis not present

## 2019-09-11 DIAGNOSIS — I10 Essential (primary) hypertension: Secondary | ICD-10-CM | POA: Diagnosis not present

## 2019-09-11 DIAGNOSIS — E782 Mixed hyperlipidemia: Secondary | ICD-10-CM

## 2019-09-11 MED ORDER — FUROSEMIDE 40 MG PO TABS: 40.0000 mg | ORAL_TABLET | Freq: Every day | ORAL | 1 refills | Status: DC

## 2019-09-11 MED ORDER — POTASSIUM CHLORIDE CRYS ER 20 MEQ PO TBCR: 20.0000 meq | EXTENDED_RELEASE_TABLET | Freq: Every day | ORAL | 0 refills | Status: DC

## 2019-09-11 NOTE — Patient Instructions (Addendum)
Medication Instructions:    START TAKING  LASIX 40 MG ONCE A DAY    FOR THREE DAYS ONLY:  TAKE 80 MG  THEN RESUME BACK TO  LASIX 40 MG   ALSO TAKE POTASSIUM 20 MEQ ON FOR 3 DAYS ONLY THEN STOP   *If you need a refill on your cardiac medications before your next appointment, please call your pharmacy*   Lab Work: [place]  BMET     If you have labs (blood work) drawn today and your tests are completely normal, you will receive your results only by: Marland Kitchen MyChart Message (if you have MyChart) OR . A paper copy in the mail If you have any lab test that is abnormal or we need to change your treatment, we will call you to review the results.   Testing/Procedures:  NONE ORDERED  TODAY    Follow-Up: At [facility], you and your health needs are our priority.  As part of our continuing mission to provide you with exceptional heart care, we have created designated Provider Care Teams.  These Care Teams include your primary Cardiologist (physician) and Advanced Practice Providers (APPs -  Physician Assistants and Nurse Practitioners) who all work together to provide you with the care you need, when you need it.  We recommend signing up for the patient portal called "MyChart".  Sign up information is provided on this After Visit Summary.  MyChart is used to connect with patients for Virtual Visits (Telemedicine).  Patients are able to view lab/test results, encounter notes, upcoming appointments, etc.  Non-urgent messages can be sent to your provider as well.   To learn more about what you can do with MyChart, go to [email or web address].    Your next appointment:   2 week(s)  The format for your next appointment:   Either In Person or Virtual  Provider:  Melbourne Abts  IN 2 WEEKS   Other Instructions

## 2019-09-12 ENCOUNTER — Ambulatory Visit: Payer: BLUE CROSS/BLUE SHIELD | Admitting: Physician Assistant

## 2019-09-23 ENCOUNTER — Encounter (HOSPITAL_COMMUNITY): Payer: BLUE CROSS/BLUE SHIELD | Admitting: Physician Assistant

## 2019-09-26 NOTE — Progress Notes (Signed)
Virtual Visit via Telephone Note   This visit type was conducted due to national recommendations for restrictions regarding the COVID-19 Pandemic (e.g. social distancing) in an effort to limit this patient's exposure and mitigate transmission in our community.  Due to her co-morbid illnesses, this patient is at least at moderate risk for complications without adequate follow up.  This format is felt to be most appropriate for this patient at this time.  The patient did not have access to video technology/had technical difficulties with video requiring transitioning to audio format only (telephone).  All issues noted in this document were discussed and addressed.  No physical exam could be performed with this format.  Please refer to the patient's chart for her  consent to telehealth for [facility].   The patient was identified using 2 identifiers.  Date:  09/27/2019   ID:  Mary Mcfarland, DOB 1955/07/06, MRN [id number]  Patient Location: Home Provider Location: Office  PCP:  Lawerance Cruel, MD  Cardiologist:  Quay Burow, MD  Electrophysiologist:  Thompson Grayer, MD   Evaluation Performed:  Follow-Up Visit  Chief Complaint:  Follow-up LE edema  History of Present Illness:    Mary Mcfarland is a 64 y.o. female with a PMH of persistent atrial fibrillation, HTN, HLD, bipolar disorder, morbid obesity, and recent LE edema, who presents today for follow-up of her edema.   She was last seen by cardiology at an outpatient visit with myself 09/11/19, at which time she reported poorly controlled blood pressures and LE edema since stopping HCTZ. She was started on lasix 80mg  daily x3 days, then 40mg  daily going forward.   Her last echocardiogram 06/2019 showed EF 60-65%, mild LVH, indeterminate LV diastolic function, no RWMA, moderate LAE, and no significant valvular abnormalities. She had a calcium score of 22, placing her in the 75th percentile for age/sex 06/2019. No further  ischemic testing performed.   She presents today for close follow-up. She notes resolution of her LE edema. She continues to have elevated blood pressures, generally in the 140s-150s/80s. HR's have been in the 80s-100s. She denies chest pain, SOB, palpitations, dizziness, lightheadedness, syncope, orthopnea, or PND. She notes she is in the process of getting medicaid coverage and is currently without insurance. She is hopeful her new insurance will kick in Oct 12, 2019. She plans to rescheduled her visit with Dr. Rayann Heman next week as she cannot afford an out of pocket visit.   The patient does not have symptoms concerning for COVID-19 infection (fever, chills, cough, or new shortness of breath).    Past Medical History:  Diagnosis Date  . Asthma   . Bipolar disorder ([place])   . Difficult intubation    Narrow airway  . Hypertension   . Longstanding persistent atrial fibrillation ([place])   . Sleep apnea    sleep study was "inconclusive" per patient  . Visit for monitoring Tikosyn therapy 08/27/2019   Past Surgical History:  Procedure Laterality Date  . bone spur surgery    . CARDIOVERSION N/A 07/24/2019   Procedure: CARDIOVERSION;  Surgeon: Buford Dresser, MD;  Location: [facility] [place] [facility] ENDOSCOPY;  Service: Cardiovascular;  Laterality: N/A;  . COLONOSCOPY    . HEEL SPUR RESECTION Left 03/15/2016   Procedure: ACHILLIES TENDON REPAIR AND PUMP BUMP EXCISION;  Surgeon: Melrose Nakayama, MD;  Location: [place];  Service: Orthopedics;  Laterality: Left;  . TONSILLECTOMY       Current Meds  Medication Sig  . albuterol (PROVENTIL HFA;VENTOLIN HFA) 108 (90  Base) MCG/ACT inhaler Inhale 2 puffs into the lungs every 6 (six) hours as needed for wheezing or shortness of breath.  Marland Kitchen apixaban (ELIQUIS) 5 MG TABS tablet Take 1 tablet (5 mg total) by mouth 2 (two) times daily.  Marland Kitchen atorvastatin (LIPITOR) 40 MG tablet Take 1 tablet (40 mg total) by mouth daily.  Marland Kitchen buPROPion (WELLBUTRIN SR) 150 MG 12 hr tablet Take 300 mg  by mouth daily.  Marland Kitchen dofetilide (TIKOSYN) 250 MCG capsule Take 1 capsule (250 mcg total) by mouth 2 (two) times daily.  . furosemide (LASIX) 40 MG tablet Take 1 tablet (40 mg total) by mouth daily.  Marland Kitchen ibuprofen (ADVIL) 800 MG tablet Take 800 mg by mouth at bedtime as needed (for pain).   Marland Kitchen lisinopril (ZESTRIL) 20 MG tablet Take 20 mg by mouth daily.  . Naphazoline-Pheniramine (OPCON-A) 0.027-0.315 % SOLN Place 1-2 drops into both eyes 3 (three) times daily as needed (for allergies).   Marland Kitchen omeprazole (PRILOSEC) 20 MG capsule Take 20 mg by mouth daily before breakfast.   . potassium chloride SA (KLOR-CON M20) 20 MEQ tablet Take 1 tablet (20 mEq total) by mouth daily. TAKE FOR 3 DAYS THEN STOP     Allergies:   Niacin and Horse-derived products   Social History   Tobacco Use  . Smoking status: Former Smoker    Packs/day: 1.00    Types: Cigarettes    Quit date: 02/13/1996    Years since quitting: 23.6  . Smokeless tobacco: Never Used  Substance Use Topics  . Alcohol use: Yes    Alcohol/week: 2.0 standard drinks    Types: 2 Shots of liquor per week    Comment: rarely  . Drug use: Never     Family Hx: The patient's family history includes CVA in her mother; Cancer in her mother; Heart attack in her father; Heart disease in her father and mother; Hypertension in her father and mother.  ROS:   Please see the history of present illness.     All other systems reviewed and are negative.   Prior CV studies:   The following studies were reviewed today:  Echocardiogram 06/2019: 1. Left ventricular ejection fraction, by visual estimation, is 60 to  65%. The left ventricle has normal function. There is mildly increased  left ventricular hypertrophy.  2. Left ventricular diastolic parameters are indeterminate.  3. The left ventricle has no regional wall motion abnormalities.  4. Global right ventricle has normal systolic function.The right  ventricular size is normal. No increase in right  ventricular wall  thickness.  5. Left atrial size was moderately dilated.  6. Right atrial size was normal.  7. The mitral valve is normal in structure. Trivial mitral valve  regurgitation. No evidence of mitral stenosis.  8. The tricuspid valve is normal in structure.  9. The tricuspid valve is normal in structure. Tricuspid valve  regurgitation is not demonstrated.  10. The aortic valve is tricuspid. Aortic valve regurgitation is not  visualized. Mild to moderate aortic valve sclerosis/calcification without  any evidence of aortic stenosis.  11. Calcified non coronary cusp.  12. The pulmonic valve was normal in structure. Pulmonic valve  regurgitation is not visualized.  13. The inferior vena cava is normal in size with greater than 50%  respiratory variability, suggesting right atrial pressure of 3 mmHg.    Labs/Other Tests and Data Reviewed:    EKG:  No ECG reviewed.  Recent Labs: 06/24/2019: ALT 20; TSH 2.490 07/19/2019: Hemoglobin 14.2; Platelets 301  09/06/2019: BUN 15; Creatinine, Ser 0.64; Magnesium 1.9; Potassium 4.1; Sodium 143   Recent Lipid Panel Lab Results  Component Value Date/Time   CHOL 209 (H) 06/24/2019 09:03 AM   TRIG 116 06/24/2019 09:03 AM   HDL 57 06/24/2019 09:03 AM   CHOLHDL 3.7 06/24/2019 09:03 AM   LDLCALC 131 (H) 06/24/2019 09:03 AM    Wt Readings from Last 3 Encounters:  09/27/19 265 lb (120.2 kg)  09/11/19 271 lb (122.9 kg)  09/06/19 266 lb 6.4 oz (120.8 kg)     Objective:    Vital Signs:  BP (!) 142/85   Pulse (!) 110   Ht 5\' 6"  (1.676 m)   Wt 265 lb (120.2 kg)   LMP 09/24/2013   BMI 42.77 kg/m    VITAL SIGNS:  reviewed GEN:  no acute distress RESPIRATORY:  speaking in full sentences without SOB CARDIOVASCULAR:  no peripheral edema NEURO:  A&O x3 PSYCH:  normal affect  ASSESSMENT & PLAN:    1. HTN: BP 142/85 today  - Will restart metoprolol 25mg  BID - Continue lisinopril 20mg  daily and lasix 40mg  daily  - Recommend  BMET in the next week for close monitoring   2. LE edema: resolved with starting lasix - Continue lasix 40mg  daily - Encouraged daily weights and low sodium diet going forward.  - Will check a BMET as soon as her insurance kicks in for close monitoring of her kidney and electrolyte function.   3. Paroxysmal atrial fibrillation: HR is elevated to 110 today.  - Continue tikosyn for rhythm control - Continue eliquis for stroke ppx - Continue follow-up with the Afib clinic as soon as her insurance kicks in.   4. HLD: LDL 131 06/2019 - Continue atorvastatin - Will check FLP/LFTs at her convenience when her insurance kicks in for close monitoring  COVID-19 Education: The signs and symptoms of COVID-19 were discussed with the patient and how to seek care for testing (follow up with PCP or arrange E-visit).  The importance of social distancing was discussed today.  Time:   Today, I have spent 10 minutes with the patient with telehealth technology discussing the above problems.     Medication Adjustments/Labs and Tests Ordered: Current medicines are reviewed at length with the patient today.  Concerns regarding medicines are outlined above.   Tests Ordered: No orders of the defined types were placed in this encounter.   Medication Changes: No orders of the defined types were placed in this encounter.   Follow Up:  In Person with Dr. Rayann Heman as soon as her insurance kicks in and with Dr. Gwenlyn Found in 6 months.   Signed, Abigail Butts, PA-C  09/27/2019 9:08 AM    [facility]

## 2019-09-27 ENCOUNTER — Telehealth (INDEPENDENT_AMBULATORY_CARE_PROVIDER_SITE_OTHER): Payer: Self-pay | Admitting: Medical

## 2019-09-27 ENCOUNTER — Encounter: Payer: Self-pay | Admitting: Medical

## 2019-09-27 VITALS — BP 142/85 | HR 110 | Ht 66.0 in | Wt 265.0 lb

## 2019-09-27 DIAGNOSIS — E782 Mixed hyperlipidemia: Secondary | ICD-10-CM

## 2019-09-27 DIAGNOSIS — R600 Localized edema: Secondary | ICD-10-CM

## 2019-09-27 DIAGNOSIS — I480 Paroxysmal atrial fibrillation: Secondary | ICD-10-CM

## 2019-09-27 DIAGNOSIS — I10 Essential (primary) hypertension: Secondary | ICD-10-CM

## 2019-09-27 MED ORDER — METOPROLOL TARTRATE 25 MG PO TABS: 25.0000 mg | ORAL_TABLET | Freq: Two times a day (BID) | ORAL | 3 refills | Status: DC

## 2019-09-27 NOTE — Patient Instructions (Signed)
Medication Instructions:  RESTART- Metoprolol 25 mg by mouth twice a day  *If you need a refill on your cardiac medications before your next appointment, please call your pharmacy*   Lab Work: CMP and Fasting Lipid  If you have labs (blood work) drawn today and your tests are completely normal, you will receive your results only by: Marland Kitchen MyChart Message (if you have MyChart) OR . A paper copy in the mail If you have any lab test that is abnormal or we need to change your treatment, we will call you to review the results.   Testing/Procedures: None Ordered   Follow-Up: At [facility], you and your health needs are our priority.  As part of our continuing mission to provide you with exceptional heart care, we have created designated Provider Care Teams.  These Care Teams include your primary Cardiologist (physician) and Advanced Practice Providers (APPs -  Physician Assistants and Nurse Practitioners) who all work together to provide you with the care you need, when you need it.  We recommend signing up for the patient portal called "MyChart".  Sign up information is provided on this After Visit Summary.  MyChart is used to connect with patients for Virtual Visits (Telemedicine).  Patients are able to view lab/test results, encounter notes, upcoming appointments, etc.  Non-urgent messages can be sent to your provider as well.   To learn more about what you can do with MyChart, go to [email or web address].    Your next appointment:   6 month(s)  The format for your next appointment:   In Person  Provider:   You may see Quay Burow, MD or one of the following Advanced Practice Providers on your designated Care Team:    Kerin Ransom, PA-C  [place], [place]  Coletta Memos, FNP    Other Instructions Call to make appointment when insurance is situated to make appointment with Dr Rayann Heman

## 2019-10-03 ENCOUNTER — Encounter (HOSPITAL_COMMUNITY): Payer: Self-pay

## 2019-10-03 ENCOUNTER — Other Ambulatory Visit: Payer: Self-pay

## 2019-10-03 ENCOUNTER — Ambulatory Visit: Payer: BLUE CROSS/BLUE SHIELD | Admitting: Internal Medicine

## 2019-10-03 ENCOUNTER — Emergency Department (HOSPITAL_COMMUNITY)
Admission: EM | Admit: 2019-10-03 | Discharge: 2019-10-03 | Disposition: A | Discharge status: Home / Self Care | Payer: Medicaid Other | Attending: Emergency Medicine | Admitting: Emergency Medicine

## 2019-10-03 ENCOUNTER — Emergency Department (HOSPITAL_COMMUNITY): Payer: Medicaid Other

## 2019-10-03 DIAGNOSIS — Z87891 Personal history of nicotine dependence: Secondary | ICD-10-CM | POA: Diagnosis not present

## 2019-10-03 DIAGNOSIS — I4819 Other persistent atrial fibrillation: Secondary | ICD-10-CM

## 2019-10-03 DIAGNOSIS — R079 Chest pain, unspecified: Secondary | ICD-10-CM | POA: Diagnosis not present

## 2019-10-03 DIAGNOSIS — Z7901 Long term (current) use of anticoagulants: Secondary | ICD-10-CM | POA: Diagnosis not present

## 2019-10-03 DIAGNOSIS — R0600 Dyspnea, unspecified: Secondary | ICD-10-CM

## 2019-10-03 DIAGNOSIS — I10 Essential (primary) hypertension: Secondary | ICD-10-CM

## 2019-10-03 DIAGNOSIS — Z79899 Other long term (current) drug therapy: Secondary | ICD-10-CM | POA: Diagnosis not present

## 2019-10-03 DIAGNOSIS — R0609 Other forms of dyspnea: Secondary | ICD-10-CM

## 2019-10-03 LAB — TROPONIN I (HIGH SENSITIVITY)
Troponin I (High Sensitivity): 6 ng/L (ref <18)
Troponin I (High Sensitivity): 7 ng/L (ref <18)

## 2019-10-03 LAB — BASIC METABOLIC PANEL
Anion gap: 7 (ref 5–15)
BUN: 22 mg/dL (ref 8–23)
CO2: 27 mmol/L (ref 22–32)
Calcium: 9.1 mg/dL (ref 8.9–10.3)
Chloride: 108 mmol/L (ref 98–111)
Creatinine, Ser: 0.93 mg/dL (ref 0.44–1.00)
GFR calc Af Amer: >60 mL/min (ref >60)
GFR calc non Af Amer: >60 mL/min (ref >60)
Glucose, Bld: 110 mg/dL — ABNORMAL HIGH (ref 70–99)
Potassium: 4.2 mmol/L (ref 3.5–5.1)
Sodium: 142 mmol/L (ref 135–145)

## 2019-10-03 LAB — CBC
HCT: 41.8 % (ref 36.0–46.0)
Hemoglobin: 13.3 g/dL (ref 12.0–15.0)
MCH: 29 pg (ref 26.0–34.0)
MCHC: 31.8 g/dL (ref 30.0–36.0)
MCV: 91.1 fL (ref 80.0–100.0)
Platelets: 306 10*3/uL (ref 150–400)
RBC: 4.59 MIL/uL (ref 3.87–5.11)
RDW: 12.7 % (ref 11.5–15.5)
WBC: 12.6 10*3/uL — ABNORMAL HIGH (ref 4.0–10.5)
nRBC: 0 % (ref 0.0–0.2)

## 2019-10-03 LAB — BRAIN NATRIURETIC PEPTIDE: B Natriuretic Peptide: 208.7 pg/mL — ABNORMAL HIGH (ref 0.0–100.0)

## 2019-10-03 LAB — MAGNESIUM: Magnesium: 2.2 mg/dL (ref 1.7–2.4)

## 2019-10-03 MED ORDER — SODIUM CHLORIDE 0.9% FLUSH
3.0000 mL | Freq: Once | INTRAVENOUS | Status: AC
  Administered 2019-10-03: 3 mL via INTRAVENOUS

## 2019-10-03 MED ORDER — DOFETILIDE 250 MCG PO CAPS
250.0000 ug | ORAL_CAPSULE | Freq: Once | ORAL | Status: DC
  Filled 2019-10-03 (×2): qty 1

## 2019-10-03 MED ORDER — NITROGLYCERIN 0.4 MG SL SUBL: 0.4000 mg | SUBLINGUAL_TABLET | SUBLINGUAL | 0 refills | Status: DC | PRN

## 2019-10-03 NOTE — ED Notes (Signed)
Request sent to pharmacy to verify Tikosyn

## 2019-10-03 NOTE — Discharge Instructions (Signed)
Your work up today has been reassuring, please follow up closely with your cardiologist, as discussed. If you have chest pain that is not relieved with rest take a nitro tablet, if pain continues you can take a second one, but if your pain is still not relieved you should call 911.

## 2019-10-03 NOTE — ED Provider Notes (Signed)
[facility] EMERGENCY DEPARTMENT Provider Note   CSN: [id number] Arrival date & time: 10/03/19  1250     History Chief Complaint  Patient presents with  . Chest Pain    Mary Mcfarland is a 64 y.o. female.  Mary Mcfarland is a 64 y.o. female with hx of Afib, HTN, HLD, Obesity, sleep apnea, who presents to the ED for evaluation of exertional dyspnea.  Patient states that at the end of March she was hospitalized for a Tikosyn load, and they had initially planned to do a cardioversion but then she converted out of A. fib herself.  Ever since she was discharged home she has had some very mild exertional dyspnea but over the past few days this has gotten worse.  She lives on the second floor of a building and feels like she can barely get up to the steps to her home, without feeling completely out of breath.  She states that today she started feeling some chest discomfort and tightness associated with the shortness of breath and exertion.  Shortness of breath and chest tightness resolved with rest.  She has not felt like she slipped back into A. fib since she converted, and has been taking her Tikosyn regularly.  She is also on Eliquis and has not missed any doses of this.  She has no prior history of PE, has had some mild lower extremity swelling, but was recently started back on Lasix and the swelling has been improving.  She was previously on this but her blood pressure was dropping low so they discontinued the medication.  She denies any associated cough or fever.  No known sick contacts.  No radiation of pain, no abdominal pain, nausea or vomiting.  She has had some intermittent lightheadedness but no syncopal episodes.  She is followed by Dr. Gwenlyn Found and Dr. Algernon Huxley with cardiology.  Has never had a previous heart catheter stress test.        Past Medical History:  Diagnosis Date  . Asthma   . Bipolar disorder ([place])   . Difficult intubation    Narrow airway    . Hypertension   . Longstanding persistent atrial fibrillation ([place])   . Sleep apnea    sleep study was "inconclusive" per patient  . Visit for monitoring Tikosyn therapy 08/27/2019    Patient Active Problem List   Diagnosis Date Noted  . Persistent atrial fibrillation ([place]) 08/27/2019  . Atrial fibrillation ([place]) 06/21/2019  . Essential hypertension 06/21/2019  . Hyperlipidemia 06/21/2019  . Morbid obesity ([place]) 06/21/2019  . Family history of heart disease 06/21/2019  . Haglund's deformity of right heel 03/15/2016  . Heel spur, right 03/15/2016    Past Surgical History:  Procedure Laterality Date  . bone spur surgery    . CARDIOVERSION N/A 07/24/2019   Procedure: CARDIOVERSION;  Surgeon: Buford Dresser, MD;  Location: [facility] ENDOSCOPY;  Service: Cardiovascular;  Laterality: N/A;  . COLONOSCOPY    . HEEL SPUR RESECTION Left 03/15/2016   Procedure: ACHILLIES TENDON REPAIR AND PUMP BUMP EXCISION;  Surgeon: Melrose Nakayama, MD;  Location: [facility];  Service: Orthopedics;  Laterality: Left;  . TONSILLECTOMY       OB History   No obstetric history on file.     Family History  Problem Relation Age of Onset  . Heart disease Mother   . CVA Mother   . Hypertension Mother   . Cancer Mother   . Heart disease Father   . Hypertension Father   .  Heart attack Father     Social History   Tobacco Use  . Smoking status: Former Smoker    Packs/day: 1.00    Types: Cigarettes    Quit date: 02/13/1996    Years since quitting: 23.6  . Smokeless tobacco: Never Used  Substance Use Topics  . Alcohol use: Yes    Alcohol/week: 2.0 standard drinks    Types: 2 Shots of liquor per week    Comment: rarely  . Drug use: Never    Home Medications Prior to Admission medications   Medication Sig Start Date End Date Taking? Authorizing Provider  albuterol (PROVENTIL HFA;VENTOLIN HFA) 108 (90 Base) MCG/ACT inhaler Inhale 2 puffs into the lungs every 6 (six) hours as needed for wheezing or  shortness of breath.   Yes [provider]  apixaban (ELIQUIS) 5 MG TABS tablet Take 1 tablet (5 mg total) by mouth 2 (two) times daily. 08/20/19  Yes Lorretta Harp, MD  atorvastatin (LIPITOR) 40 MG tablet Take 1 tablet (40 mg total) by mouth daily. 07/03/19 10/03/19 Yes Lorretta Harp, MD  buPROPion [facility] SR) 150 MG 12 hr tablet Take 300 mg by mouth daily.   Yes [provider]  dofetilide (TIKOSYN) 250 MCG capsule Take 1 capsule (250 mcg total) by mouth 2 (two) times daily. 09/06/19  Yes Fenton, Clint R, PA  furosemide (LASIX) 40 MG tablet Take 1 tablet (40 mg total) by mouth daily. 09/11/19 12/10/19 Yes Kroeger, Lorelee Cover., PA-C  ibuprofen (ADVIL) 800 MG tablet Take 800 mg by mouth at bedtime as needed (for pain).    Yes [provider]  lisinopril (ZESTRIL) 20 MG tablet Take 20 mg by mouth daily. 09/09/19  Yes [provider]  metoprolol tartrate (LOPRESSOR) 25 MG tablet Take 1 tablet (25 mg total) by mouth 2 (two) times daily. 09/27/19 12/26/19 Yes Kroeger, Lorelee Cover., PA-C  Naphazoline-Pheniramine (OPCON-A) 0.027-0.315 % SOLN Place 1-2 drops into both eyes 3 (three) times daily as needed (for allergies).    Yes [provider]  omeprazole (PRILOSEC) 20 MG capsule Take 20 mg by mouth daily before breakfast.    Yes [provider]  nitroGLYCERIN (NITROSTAT) 0.4 MG SL tablet Place 1 tablet (0.4 mg total) under the tongue every 5 (five) minutes as needed for chest pain. 10/03/19   Jacqlyn Larsen, PA-C  potassium chloride SA (KLOR-CON M20) 20 MEQ tablet Take 1 tablet (20 mEq total) by mouth daily. TAKE FOR 3 DAYS THEN STOP Patient not taking: Reported on 10/03/2019 09/11/19 12/10/19  Abigail Butts., PA-C    Allergies    Niacin and Horse-derived products  Review of Systems   Review of Systems  Constitutional: Negative for chills and fever.  HENT: Negative.   Respiratory: Positive for chest tightness and shortness of breath. Negative for  cough.   Cardiovascular: Positive for chest pain and leg swelling. Negative for palpitations.  Gastrointestinal: Negative for abdominal pain, nausea and vomiting.  Genitourinary: Negative for dysuria and frequency.  Musculoskeletal: Negative for arthralgias and myalgias.  Skin: Negative for color change and rash.  Neurological: Positive for light-headedness. Negative for dizziness and syncope.    Physical Exam Updated Vital Signs BP 96/74 (BP Location: Right Arm)   Pulse 85   Temp 98.4 F (36.9 C) (Oral)   Resp 14   Ht 5\' 6"  (1.676 m)   Wt 119.3 kg   LMP 09/24/2013   SpO2 100%   BMI 42.45 kg/m   Physical Exam Vitals and  nursing note reviewed.  Constitutional:      General: She is not in acute distress.    Appearance: She is well-developed and normal weight. She is not ill-appearing or diaphoretic.     Comments: Well-appearing and in no distress  HENT:     Head: Normocephalic and atraumatic.  Eyes:     General:        Right eye: No discharge.        Left eye: No discharge.  Cardiovascular:     Rate and Rhythm: Normal rate and regular rhythm.     Pulses:          Radial pulses are 2+ on the right side and 2+ on the left side.       Dorsalis pedis pulses are 2+ on the right side and 2+ on the left side.     Heart sounds: Normal heart sounds. No murmur. No friction rub. No gallop.   Pulmonary:     Effort: Pulmonary effort is normal. No respiratory distress.     Breath sounds: Normal breath sounds. No wheezing or rales.     Comments: Respirations equal and unlabored, patient able to speak in full sentences, lungs clear to auscultation bilaterally Chest:     Chest wall: No tenderness.  Abdominal:     General: Bowel sounds are normal. There is no distension.     Palpations: Abdomen is soft. There is no mass.     Tenderness: There is no abdominal tenderness. There is no guarding.     Comments: Abdomen soft, nondistended, nontender to palpation in all quadrants without  guarding or peritoneal signs  Musculoskeletal:        General: No deformity.     Cervical back: Neck supple.     Comments: Trace edema noted over bilateral ankles  Skin:    General: Skin is warm and dry.     Capillary Refill: Capillary refill takes less than 2 seconds.  Neurological:     Mental Status: She is alert.     Coordination: Coordination normal.     Comments: Speech is clear, able to follow commands Moves extremities without ataxia, coordination intact  Psychiatric:        Mood and Affect: Mood normal.        Behavior: Behavior normal.     ED Results / Procedures / Treatments   Labs (all labs ordered are listed, but only abnormal results are displayed) Labs Reviewed  BASIC METABOLIC PANEL - Abnormal; Notable for the following components:      Result Value   Glucose, Bld 110 (*)    All other components within normal limits  CBC - Abnormal; Notable for the following components:   WBC 12.6 (*)    All other components within normal limits  BRAIN NATRIURETIC PEPTIDE - Abnormal; Notable for the following components:   B Natriuretic Peptide 208.7 (*)    All other components within normal limits  MAGNESIUM  TROPONIN I (HIGH SENSITIVITY)  TROPONIN I (HIGH SENSITIVITY)    EKG EKG Interpretation  Date/Time:  Thursday October 03 2019 12:48:36 EDT Ventricular Rate:  64 PR Interval:  216 QRS Duration: 128 QT Interval:  458 QTC Calculation: 472 R Axis:   73 Text Interpretation: Sinus rhythm with marked sinus arrhythmia with 1st degree A-V block Right bundle branch block Abnormal ECG Confirmed by Madalyn Rob [phone]) on 10/03/2019 7:40:33 PM   Radiology DG Chest 2 View  Result Date: 10/03/2019 CLINICAL DATA:  Chest tightness for 3  days.  Fatigue. EXAM: CHEST - 2 VIEW COMPARISON:  06/18/2015 FINDINGS: Cardiac silhouette is normal in size. No mediastinal or hilar masses or evidence of adenopathy. Clear lungs.  No pleural effusion or pneumothorax. Skeletal structures are  intact. IMPRESSION: No active cardiopulmonary disease. Electronically Signed   By: Lajean Manes M.D.   On: 10/03/2019 13:38    Procedures Procedures (including critical care time)  Medications Ordered in ED Medications  dofetilide (TIKOSYN) capsule 250 mcg (250 mcg Oral Not Given 10/03/19 2130)  sodium chloride flush (NS) 0.9 % injection 3 mL (3 mLs Intravenous Given 10/03/19 1829)    ED Course  I have reviewed the triage vital signs and the nursing notes.  Pertinent labs & imaging results that were available during my care of the patient were reviewed by me and considered in my medical decision making (see chart for details).  Clinical Course as of Oct 06 2028  Mon Oct 07, 2019  2027 HCT: 41.8 [KF]    Clinical Course User Index [KF] Janet Berlin   MDM Rules/Calculators/A&P                     Patient presents with worsening dyspnea on exertion over the past few days, today developing exertional chest pain as well.  Was recently started on Tikosyn and she self converted from A. fib.  On arrival she has a sinus rhythm without significant EKG changes.  She reports some swelling in her lower extremities that improved after she was placed back on Lasix.  Worsening shortness of breath that is now limiting her activity at home.  She is compliant in taking her blood thinner so I feel PE is less likely.  Concern for potential CHF, given development of exertional chest pain today concerned this could be related to coronary artery disease as well.  Patient has no previous history of MIs, previous echoes noted but I do not see any evidence of stress test or heart cath.  Will get cardiac labs and chest x-ray.  I have personally ordered, reviewed and interpreted all labs and imaging. CBC: Mild leukocytosis, stable hemoglobin BMP: glucose of 110, no other significant electrolyte derangements, normal renal function Troponin: Negative x2 BNP: 208.7 Chest x-ray: No active cardiopulmonary  disease, in particular there is no pulmonary edema noted.  Patient ambulated in the department and did not become hypoxic but did have significant dyspnea on exertion.  Despite reassuring work-up symptoms are concerning we will discuss with cardiology.  Concern for developing CAD with stable angina.  Case discussed with Dr. Gilman Schmidt who will see and evaluate patient in the department.  After Dr. Nechama Guard saw the patient initially the plan was for admission with likely stress testing due to limitations of patient's insurance for outpatient work-up, but after it was determined that this would be an observational admission and would not be covered by her insurance patient stated that she would rather go home and follow-up as an outpatient.  Dr. Nechama Guard requested that the patient be sent home with nitro and he is provided patient instructions on this.  At this time patient is chest pain-free, and only feels short of breath with ambulation.  She has been given strict return precautions.  Discharged home in good condition.  Final Clinical Impression(s) / ED Diagnoses Final diagnoses:  Dyspnea on exertion  Exertional chest pain    Rx / DC Orders ED Discharge Orders         Ordered  nitroGLYCERIN (NITROSTAT) 0.4 MG SL tablet  Every 5 min PRN     10/03/19 2124           Jacqlyn Larsen, PA-C 10/07/19 2030    Lucrezia Starch, MD 10/09/19 [phone]

## 2019-10-03 NOTE — Consult Note (Signed)
Cardiology Consultation:   Patient ID: Mary Mcfarland MRN: [id number]; DOB: [birth year]  Admit date: 10/03/2019 Date of Consult: 10/03/2019  Primary Care Provider: Lawerance Cruel, MD Primary Cardiologist: Quay Burow, MD  Primary Electrophysiologist:  Thompson Grayer, MD    Patient Profile:   Mary Mcfarland is a 64 y.o. female with a hx of persistent atrial fibrillation, hypertension, hyperlipidemia, bipolar disorder, morbid obesity who is being seen today for the evaluation of chest pain at the request of Dr Roslynn Amble.  History of Present Illness:   Mary Mcfarland is a 64 year old female with the above medical history who presented to the ED with chest pain today.    She was last seen by Roby Lofts, PA-C in virtual visit on 09/27/2019.  Most recent echocardiogram in January 2021 showed EF 60 to 65%, no significant valvular abnormalities.  Calcium score in January 2021 was 22 (75th percentile).  She has had recent issues with lower extremity edema, has resolved with p.o. Lasix.  She was admitted in March 2021 for Tikosyn loading for persistent atrial fibrillation.  She converted to sinus rhythm with Tikosyn, did not required DCCV.  She presented to the ED with chest pain and exertional dyspnea.  She reports that she has been having dyspnea on exertion.  States that most exertion she does is walking up the stairs where she lives.  Reports that she has been having significant shortness of breath on walking up the stairs.  Today she had an episode of chest pain for the first time.  States that it occurs when she was walking into a restaurant, noted chest tightness that lasted a few seconds and resolved.  She denies any chest pain with walking up her stairs.  Given her symptoms, she presented to the ED today.  On presentation she was afebrile, BP 173/149, pulse 84, SPO2 98% on room air.  Repeat blood pressure down to 96/74.  EKG shows sinus rhythm with marked sinus arrhythmia,  first-degree AV block, right bundle branch block, rate 62, QTc 472.  Labs notable for creatinine 0.9, white blood cell count 12.6, hemoglobin 13, platelets 306, BNP 209, high-sensitivity troponin 6 >7.  Chest x-ray shows no active cardiopulmonary disease.     Past Medical History:  Diagnosis Date  . Asthma   . Bipolar disorder ([place])   . Difficult intubation    Narrow airway  . Hypertension   . Longstanding persistent atrial fibrillation ([place])   . Sleep apnea    sleep study was "inconclusive" per patient  . Visit for monitoring Tikosyn therapy 08/27/2019    Past Surgical History:  Procedure Laterality Date  . bone spur surgery    . CARDIOVERSION N/A 07/24/2019   Procedure: CARDIOVERSION;  Surgeon: Buford Dresser, MD;  Location: [facility] ENDOSCOPY;  Service: Cardiovascular;  Laterality: N/A;  . COLONOSCOPY    . HEEL SPUR RESECTION Left 03/15/2016   Procedure: ACHILLIES TENDON REPAIR AND PUMP BUMP EXCISION;  Surgeon: Melrose Nakayama, MD;  Location: [facility];  Service: Orthopedics;  Laterality: Left;  . TONSILLECTOMY       Inpatient Medications: Scheduled Meds:  Continuous Infusions:  PRN Meds:   Allergies:    Allergies  Allergen Reactions  . Niacin Hives  . Horse-Derived Products Swelling and Other (See Comments)    Eyes swell     Social History:   Social History   Socioeconomic History  . Marital status: Single    Spouse name: Not on file  . Number of children: Not on file  .  Years of education: Not on file  . Highest education level: Not on file  Occupational History  . Not on file  Tobacco Use  . Smoking status: Former Smoker    Packs/day: 1.00    Types: Cigarettes    Quit date: 02/13/1996    Years since quitting: 23.6  . Smokeless tobacco: Never Used  Substance and Sexual Activity  . Alcohol use: Yes    Alcohol/week: 2.0 standard drinks    Types: 2 Shots of liquor per week    Comment: rarely  . Drug use: Never  . Sexual activity: Not on file  Other  Topics Concern  . 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 Difficulty of Paying Living Expenses:   Food Insecurity:   . Worried About Charity fundraiser in the Last Year:   . Arboriculturist in the Last Year:   Transportation Needs:   . Film/video editor (Medical):   Mary Mcfarland Lack of Transportation (Non-Medical):   Physical Activity:   . Days of Exercise per Week:   . Minutes of Exercise per Session:   Stress:   . Feeling of Stress :   Social Connections:   . Frequency of Communication with Friends and Family:   . Frequency of Social Gatherings with Friends and Family:   . Attends Religious Services:   . Active Member of Clubs or Organizations:   . Attends Archivist Meetings:   Mary Mcfarland Marital Status:   Intimate Partner Violence:   . Fear of Current or Ex-Partner:   . Emotionally Abused:   Mary Mcfarland Physically Abused:   . Sexually Abused:     Family History:    Family History  Problem Relation Age of Onset  . Heart disease Mother   . CVA Mother   . Hypertension Mother   . Cancer Mother   . Heart disease Father   . Hypertension Father   . Heart attack Father      ROS:  Please see the history of present illness.   All other ROS reviewed and negative.     Physical Exam/Data:   Vitals:   10/03/19 1259 10/03/19 1726  BP: (!) 173/149 96/74  Pulse: 84 85  Resp: 18 14  Temp: 98.4 F (36.9 C)   TempSrc: Oral   SpO2: 98% 100%  Weight: 119.3 kg   Height: 5\' 6"  (1.676 m)    No intake or output data in the 24 hours ending 10/03/19 2000 Last 3 Weights 10/03/2019 09/27/2019 09/11/2019  Weight (lbs) 263 lb 265 lb 271 lb  Weight (kg) 119.296 kg 120.203 kg 122.925 kg     Body mass index is 42.45 kg/m.  General:  in no acute distress HEENT: normal Lymph: no adenopathy Neck: no JVD Endocrine:  No thryomegaly Vascular: No carotid  bruits Cardiac:  normal S1, S2; RRR; no murmur  Lungs:  clear to auscultation bilaterally, no wheezing, rhonchi or rales  Abd: soft, nontender, no hepatomegaly  Ext: no edema Musculoskeletal:  No deformities, BUE and BLE strength normal and equal Skin: warm and dry  Neuro:  no focal abnormalities noted Psych:  Normal affect   EKG:  The EKG was personally reviewed and demonstrates:  EKG shows sinus rhythm with marked sinus arrhythmia, first-degree AV block, right bundle branch block, rate 62, QTc 472  Relevant CV  Studies: TTE 07/01/19: 1. Left ventricular ejection fraction, by visual estimation, is 60 to  65%. The left ventricle has normal function. There is mildly increased  left ventricular hypertrophy.  2. Left ventricular diastolic parameters are indeterminate.  3. The left ventricle has no regional wall motion abnormalities.  4. Global right ventricle has normal systolic function.The right  ventricular size is normal. No increase in right ventricular wall  thickness.  5. Left atrial size was moderately dilated.  6. Right atrial size was normal.  7. The mitral valve is normal in structure. Trivial mitral valve  regurgitation. No evidence of mitral stenosis.  8. The tricuspid valve is normal in structure.  9. The tricuspid valve is normal in structure. Tricuspid valve  regurgitation is not demonstrated.  10. The aortic valve is tricuspid. Aortic valve regurgitation is not  visualized. Mild to moderate aortic valve sclerosis/calcification without  any evidence of aortic stenosis.  11. Calcified non coronary cusp.  12. The pulmonic valve was normal in structure. Pulmonic valve  regurgitation is not visualized.  13. The inferior vena cava is normal in size with greater than 50%  respiratory variability, suggesting right atrial pressure of 3 mmHg.   Calcium score 07/01/19: Coronary calcium score of 22. This was 75th percentile for age and sex matched  control.  Laboratory Data:  High Sensitivity Troponin:   Recent Labs  Lab 10/03/19 1303 10/03/19 1833  TROPONINIHS 6 7     Chemistry Recent Labs  Lab 10/03/19 1303  NA 142  K 4.2  CL 108  CO2 27  GLUCOSE 110*  BUN 22  CREATININE 0.93  CALCIUM 9.1  GFRNONAA >60  GFRAA >60  ANIONGAP 7    No results for input(s): PROT, ALBUMIN, AST, ALT, ALKPHOS, BILITOT in the last 168 hours. Hematology Recent Labs  Lab 10/03/19 1303  WBC 12.6*  RBC 4.59  HGB 13.3  HCT 41.8  MCV 91.1  MCH 29.0  MCHC 31.8  RDW 12.7  PLT 306   BNP Recent Labs  Lab 10/03/19 1833  BNP 208.7*    DDimer No results for input(s): DDIMER in the last 168 hours.   Radiology/Studies:  DG Chest 2 View  Result Date: 10/03/2019 CLINICAL DATA:  Chest tightness for 3 days.  Fatigue. EXAM: CHEST - 2 VIEW COMPARISON:  06/18/2015 FINDINGS: Cardiac silhouette is normal in size. No mediastinal or hilar masses or evidence of adenopathy. Clear lungs.  No pleural effusion or pneumothorax. Skeletal structures are intact. IMPRESSION: No active cardiopulmonary disease. Electronically Signed   By: Lajean Manes M.D.   On: 10/03/2019 13:38   Assessment and Plan:   Chest pain/DOE: Single episode of atypical chest pain today that lasted seconds and resolved.  Also with significant dyspnea on exertion.  Suspect deconditioning contributing to dyspnea, but could represent anginal equivalent.  Recommend outpatient coronary CTA or stress test.  Patient states that limiting factor is that she is applying for Medicare but currently does not have insurance coverage.  We did discuss signs/symptoms of myocardial infarction, and will give prescription for SL NTG as needed until she can obtain her ischemia evaluation.   Persistent atrial fibrillation:Status post recent Tikosyn load, currently in normal sinus rhythm.  QTc stable - Continue tikosyn  - Continue eliquis   HTN:BP  labile in the ED.  Takes lisinopril 20 mg daily, Lasix  40 mg daily, and recently started metoprolol 25 mg twice daily       For questions or updates, please contact [facility] Please consult [email or web address] for contact info under     Signed, Donato Heinz, MD  10/03/2019 8:00 PM

## 2019-10-03 NOTE — ED Triage Notes (Signed)
Pt arrives to ED w/ c/o chest tightness and SOB x 2 days. Pt denies n/v. Pt has hx of a-fib.

## 2019-10-03 NOTE — ED Notes (Signed)
Pt walked on pulse ox. spo2 stayed greater than 93%, strong and steady gait. No changes noticed.

## 2019-10-04 ENCOUNTER — Ambulatory Visit: Payer: Self-pay

## 2019-10-05 ENCOUNTER — Ambulatory Visit: Payer: Self-pay | Attending: Internal Medicine

## 2019-10-05 DIAGNOSIS — Z23 Encounter for immunization: Secondary | ICD-10-CM

## 2019-10-05 NOTE — Progress Notes (Signed)
   Covid-19 Vaccination Clinic  Name:  Mary Mcfarland    MRN: [id number] DOB: 1956-04-30  10/05/2019  [name] was observed post Covid-19 immunization for 15 minutes without incident. She was provided with Vaccine Information Sheet and instruction to access the V-Safe system.   [name] was instructed to call 911 with any severe reactions post vaccine: Marland Kitchen Difficulty breathing  . Swelling of face and throat  . A fast heartbeat  . A bad rash all over body  . Dizziness and weakness   Immunizations Administered    Name Date Dose VIS Date Route   Pfizer COVID-19 Vaccine 10/05/2019 10:29 AM 0.3 mL 08/07/2018 Intramuscular   Manufacturer: [place]   Lot: [id number]   [place]: [id number]

## 2019-10-07 NOTE — Progress Notes (Signed)
Virtual Visit via Video Note   This visit type was conducted due to national recommendations for restrictions regarding the COVID-19 Pandemic (e.g. social distancing) in an effort to limit this patient's exposure and mitigate transmission in our community.  Due to her co-morbid illnesses, this patient is at least at moderate risk for complications without adequate follow up.  This format is felt to be most appropriate for this patient at this time.  All issues noted in this document were discussed and addressed.  A limited physical exam was performed with this format.  Please refer to the patient's chart for her consent to telehealth for [facility].   The patient was identified using 2 identifiers.  Date:  10/08/2019   ID:  Mary Mcfarland, DOB [birth year], MRN [id number]  Patient Location: Home Provider Location: Office  PCP:  Lawerance Cruel, MD    Cardiologist:  Quay Burow, MD  Electrophysiologist:  Thompson Grayer, MD   Evaluation Performed:  Follow-Up Visit  Chief Complaint:  Chest pain  History of Present Illness:    Mary Mcfarland is a 64 y.o. female with a PMH of paroxysmal atrial fibrillation now on Tikosyn, HTN, HLD, bipolar disorder, morbid obesity, LE edema, and recent ED visit for chest pain, who presents today for close outpatient follow-up.   She presented to [facility] ED 10/03/19 with complaints of chest pain and exertional dyspnea. She has been having DOE, most noticeable when walking up the stairs to her apartment, however on the day of evaluation she had a brief episode of chest tightness when walking into a restaurant which lasted for a few seconds before resolving spontaneously. She was evaluated by myself and Dr. Gardiner Rhyme at that time and chest pain was felt to be atypical, and DOE possible related to deconditioning though could not exclude anginal equivalent. Unfortunately her insurance coverage continues to limit care as she only has Medicare  part A coverage. She did not meet criteria for admission, only observation. She was given a prescription for SL nitro, with plans for close outpatient follow-up.   Her last echocardiogram 06/2019 showed EF 60-65%, mild LVH, indeterminate LV diastolic function, no RWMA, moderate LAE, and no significant valvular abnormalities. She had a calcium score of 22, placing her in the 75th percentile for age/sex 06/2019. No further ischemic testing performed.  She presents today for post-ED visit follow-up. She has not had any recurrent chest pain. Feels her DOE is stable, if not slightly worse since her ED visit. She has occasional lightheaded spells with sudden position changes and we discussed orthostatic precautions. No palpitations, however HR's have been somewhat labile - in the 40s today but has been elevated to 90s-100s in the past week. No dizziness or syncope. She is hoping to know if her medicare part B coverage kicks in on 10/14/19. She will keep [place] posted on when we can move forward with additional cardiac testing.   The patient does not have symptoms concerning for COVID-19 infection (fever, chills, cough, or new shortness of breath).     Past Medical History:  Diagnosis Date  . Asthma   . Bipolar disorder ([place])   . Difficult intubation    Narrow airway  . Hypertension   . Longstanding persistent atrial fibrillation ([place])   . Sleep apnea    sleep study was "inconclusive" per patient  . Visit for monitoring Tikosyn therapy 08/27/2019   Past Surgical History:  Procedure Laterality Date  . bone spur surgery    .  CARDIOVERSION N/A 07/24/2019   Procedure: CARDIOVERSION;  Surgeon: Buford Dresser, MD;  Location: [facility] ENDOSCOPY;  Service: Cardiovascular;  Laterality: N/A;  . COLONOSCOPY    . HEEL SPUR RESECTION Left 03/15/2016   Procedure: ACHILLIES TENDON REPAIR AND PUMP BUMP EXCISION;  Surgeon: Melrose Nakayama, MD;  Location: [employer];  Service: Orthopedics;  Laterality: Left;  .  TONSILLECTOMY       Current Meds  Medication Sig  . albuterol (PROVENTIL HFA;VENTOLIN HFA) 108 (90 Base) MCG/ACT inhaler Inhale 2 puffs into the lungs every 6 (six) hours as needed for wheezing or shortness of breath.  Marland Kitchen apixaban (ELIQUIS) 5 MG TABS tablet Take 1 tablet (5 mg total) by mouth 2 (two) times daily.  Marland Kitchen atorvastatin (LIPITOR) 40 MG tablet Take 1 tablet (40 mg total) by mouth daily.  Marland Kitchen buPROPion (WELLBUTRIN SR) 150 MG 12 hr tablet Take 300 mg by mouth daily.  Marland Kitchen dofetilide (TIKOSYN) 250 MCG capsule Take 1 capsule (250 mcg total) by mouth 2 (two) times daily.  . furosemide (LASIX) 40 MG tablet Take 1 tablet (40 mg total) by mouth daily.  Marland Kitchen ibuprofen (ADVIL) 800 MG tablet Take 800 mg by mouth at bedtime as needed (for pain).   Marland Kitchen lisinopril (ZESTRIL) 20 MG tablet Take 20 mg by mouth daily.  . metoprolol tartrate (LOPRESSOR) 25 MG tablet Take 1 tablet (25 mg total) by mouth 2 (two) times daily.  . Naphazoline-Pheniramine (OPCON-A) 0.027-0.315 % SOLN Place 1-2 drops into both eyes 3 (three) times daily as needed (for allergies).   Marland Kitchen omeprazole (PRILOSEC) 20 MG capsule Take 20 mg by mouth daily before breakfast.      Allergies:   Niacin and Horse-derived products   Social History   Tobacco Use  . Smoking status: Former Smoker    Packs/day: 1.00    Types: Cigarettes    Quit date: 02/13/1996    Years since quitting: 23.6  . Smokeless tobacco: Never Used  Substance Use Topics  . Alcohol use: Yes    Alcohol/week: 2.0 standard drinks    Types: 2 Shots of liquor per week    Comment: rarely  . Drug use: Never     Family Hx: The patient's family history includes CVA in her mother; Cancer in her mother; Heart attack in her father; Heart disease in her father and mother; Hypertension in her father and mother.  ROS:   Please see the history of present illness.     All other systems reviewed and are negative.   Prior CV studies:   The following studies were reviewed  today:  Echocardiogram 06/2019: 1. Left ventricular ejection fraction, by visual estimation, is 60 to  65%. The left ventricle has normal function. There is mildly increased  left ventricular hypertrophy.  2. Left ventricular diastolic parameters are indeterminate.  3. The left ventricle has no regional wall motion abnormalities.  4. Global right ventricle has normal systolic function.The right  ventricular size is normal. No increase in right ventricular wall  thickness.  5. Left atrial size was moderately dilated.  6. Right atrial size was normal.  7. The mitral valve is normal in structure. Trivial mitral valve  regurgitation. No evidence of mitral stenosis.  8. The tricuspid valve is normal in structure.  9. The tricuspid valve is normal in structure. Tricuspid valve  regurgitation is not demonstrated.  10. The aortic valve is tricuspid. Aortic valve regurgitation is not  visualized. Mild to moderate aortic valve sclerosis/calcification without  any evidence of aortic  stenosis.  11. Calcified non coronary cusp.  12. The pulmonic valve was normal in structure. Pulmonic valve  regurgitation is not visualized.  13. The inferior vena cava is normal in size with greater than 50%  respiratory variability, suggesting right atrial pressure of 3 mmHg.   Labs/Other Tests and Data Reviewed:    EKG:  An ECG dated 10/03/19 was personally reviewed today and demonstrated:  sinus rhythm, rate 64 bpm, marked sinus arrythmia, 1st degree AV block, chronic RBBB, no STE/D  Recent Labs: 06/24/2019: ALT 20; TSH 2.490 10/03/2019: B Natriuretic Peptide 208.7; BUN 22; Creatinine, Ser 0.93; Hemoglobin 13.3; Magnesium 2.2; Platelets 306; Potassium 4.2; Sodium 142   Recent Lipid Panel Lab Results  Component Value Date/Time   CHOL 209 (H) 06/24/2019 09:03 AM   TRIG 116 06/24/2019 09:03 AM   HDL 57 06/24/2019 09:03 AM   CHOLHDL 3.7 06/24/2019 09:03 AM   LDLCALC 131 (H) 06/24/2019 09:03 AM    Wt  Readings from Last 3 Encounters:  10/08/19 260 lb (117.9 kg)  10/03/19 263 lb (119.3 kg)  09/27/19 265 lb (120.2 kg)     Objective:    Vital Signs:  BP (!) 142/69 (BP Location: Right Arm, Patient Position: Sitting)   Pulse (!) 41   Ht 5\' 6"  (1.676 m)   Wt 260 lb (117.9 kg)   LMP 09/24/2013   BMI 41.97 kg/m    VITAL SIGNS:  reviewed GEN:  no acute distress EYES:  sclerae anicteric, EOMI - Extraocular Movements Intact RESPIRATORY:  normal work of breathing, speaking in full sentences without SOB CARDIOVASCULAR:  no peripheral edema SKIN:  no rash, lesions or ulcers. MUSCULOSKELETAL:  moving all extremities, no obvious deformities NEURO:  A&O x3 PSYCH:  normal affect  ASSESSMENT & PLAN:    1. Chest tightness/DOE: no recurrent chest tightness. She continues to have DOE. Possible she has CAD - risk factors include elevated calcium score, HTN, HLD, and obesity. Not on aspirin given need for anticoagulation - Will coordinate a coronary CTA once insurance kicks in.   2. Chronic diastolic CHF:LE edema is stable with po lasix, however DOE is worse. Cr stable on labs 10/03/19.BNP in the 200s at that time. - Will increase lasix to 40mg  BID x3 days, then resume 40mg  daily dosing. She can take additional lasix going forward for weight gain of 3lbs overnight or 5 lbs in 1 week - Encouraged daily weights and low sodium diet going forward.   3. Paroxysmal atrial fibrillation:maintaining sinus rhythm on EKG in the ED 10/03/19. HR 41 today and 48 on repeat at the time of my call. Has been in the 90s-100s on the past week.  - Will decrease metoprolol to 12.5mg  BID - patient to notify the office if HR persistently >100 - Continue tikosyn for rhythm control - Continue eliquis for stroke ppx - Continue follow-up with the Afib clinic as soon as her insurance kicks in.   4. HTN:BP 142/69 today. Blood pressures were quite labile in the ED recently. Possible this is elevated due to extra volume on  board - Continue metoprolol tartrate, lisinopril, and lasix   5. HLD:LDL 131 06/2019 - Continue atorvastatin - Will check FLP/LFTs at her convenience when her insurance kicks in for close monitoring   COVID-19 Education: The signs and symptoms of COVID-19 were discussed with the patient and how to seek care for testing (follow up with PCP or arrange E-visit).  The importance of social distancing was discussed today.  Time:  Today, I have spent 20 minutes with the patient with telehealth technology discussing the above problems.     Medication Adjustments/Labs and Tests Ordered: Current medicines are reviewed at length with the patient today.  Concerns regarding medicines are outlined above.   Tests Ordered: No orders of the defined types were placed in this encounter.   Medication Changes: No orders of the defined types were placed in this encounter.   Follow Up:  Either In Person or Virtual once insurance coverage kicks in  Signed, Abigail Butts, PA-C  10/08/2019 10:07 AM    [place]

## 2019-10-08 ENCOUNTER — Telehealth: Payer: Self-pay

## 2019-10-08 ENCOUNTER — Encounter: Payer: Self-pay | Admitting: Medical

## 2019-10-08 ENCOUNTER — Telehealth (INDEPENDENT_AMBULATORY_CARE_PROVIDER_SITE_OTHER): Payer: Self-pay | Admitting: Medical

## 2019-10-08 VITALS — BP 142/69 | HR 41 | Ht 66.0 in | Wt 260.0 lb

## 2019-10-08 DIAGNOSIS — I110 Hypertensive heart disease with heart failure: Secondary | ICD-10-CM

## 2019-10-08 DIAGNOSIS — I5032 Chronic diastolic (congestive) heart failure: Secondary | ICD-10-CM

## 2019-10-08 DIAGNOSIS — I480 Paroxysmal atrial fibrillation: Secondary | ICD-10-CM

## 2019-10-08 DIAGNOSIS — R0609 Other forms of dyspnea: Secondary | ICD-10-CM

## 2019-10-08 DIAGNOSIS — E782 Mixed hyperlipidemia: Secondary | ICD-10-CM

## 2019-10-08 DIAGNOSIS — R600 Localized edema: Secondary | ICD-10-CM

## 2019-10-08 DIAGNOSIS — E785 Hyperlipidemia, unspecified: Secondary | ICD-10-CM

## 2019-10-08 DIAGNOSIS — I10 Essential (primary) hypertension: Secondary | ICD-10-CM

## 2019-10-08 DIAGNOSIS — R079 Chest pain, unspecified: Secondary | ICD-10-CM

## 2019-10-08 MED ORDER — FUROSEMIDE 40 MG PO TABS: 40.0000 mg | ORAL_TABLET | Freq: Every day | ORAL | 3 refills | Status: DC

## 2019-10-08 MED ORDER — METOPROLOL TARTRATE 25 MG PO TABS: 12.5000 mg | ORAL_TABLET | Freq: Two times a day (BID) | ORAL | 3 refills | Status: DC

## 2019-10-08 NOTE — Patient Instructions (Addendum)
Medication Instructions:   INCREASE Lasix to 40 mg 2 times a day, then resume 40 mg daily. You may take an additional 40 mg of Lasix for weight gain of 3 lbs overnight and 5 lbs in a week  DECREASE Metoprolol Tartrate (Lopressor) to 12.5 mg 2 times a day  *If you need a refill on your cardiac medications before your next appointment, please call your pharmacy*  Lab Work: NONE ordered at this time of appointment   If you have labs (blood work) drawn today and your tests are completely normal, you will receive your results only by: Marland Kitchen MyChart Message (if you have MyChart) OR . A paper copy in the mail If you have any lab test that is abnormal or we need to change your treatment, we will call you to review the results.  Testing/Procedures: NONE ordered at this time of appointment   Follow-Up: At [facility], you and your health needs are our priority.  As part of our continuing mission to provide you with exceptional heart care, we have created designated Provider Care Teams.  These Care Teams include your primary Cardiologist (physician) and Advanced Practice Providers (APPs -  Physician Assistants and Nurse Practitioners) who all work together to provide you with the care you need, when you need it.   Your next appointment:   1 month(s)  The format for your next appointment:   In Person  Provider:   Thompson Grayer, MD  Other Instructions  Monitor Heart rate (HR), if your HR is persistently elevated greater than 100 notify our office  Continue to monitor you weight

## 2019-10-08 NOTE — Telephone Encounter (Signed)
  Patient Consent for Virtual Visit         Mary Mcfarland has provided verbal consent on 10/08/2019 for a virtual visit (video or telephone).   CONSENT FOR VIRTUAL VISIT FOR:  Mary Mcfarland  By participating in this virtual visit I agree to the following:  I hereby voluntarily request, consent and authorize [facility] and its employed or contracted physicians, physician assistants, nurse practitioners or other licensed health care professionals (the Practitioner), to provide me with telemedicine health care services (the "Services") as deemed necessary by the treating Practitioner. I acknowledge and consent to receive the Services by the Practitioner via telemedicine. I understand that the telemedicine visit will involve communicating with the Practitioner through live audiovisual communication technology and the disclosure of certain medical information by electronic transmission. I acknowledge that I have been given the opportunity to request an in-person assessment or other available alternative prior to the telemedicine visit and am voluntarily participating in the telemedicine visit.  I understand that I have the right to withhold or withdraw my consent to the use of telemedicine in the course of my care at any time, without affecting my right to future care or treatment, and that the Practitioner or I may terminate the telemedicine visit at any time. I understand that I have the right to inspect all information obtained and/or recorded in the course of the telemedicine visit and may receive copies of available information for a reasonable fee.  I understand that some of the potential risks of receiving the Services via telemedicine include:  Marland Kitchen Delay or interruption in medical evaluation due to technological equipment failure or disruption; . Information transmitted may not be sufficient (e.g. poor resolution of images) to allow for appropriate medical decision making by the  Practitioner; and/or  . In rare instances, security protocols could fail, causing a breach of personal health information.  Furthermore, I acknowledge that it is my responsibility to provide information about my medical history, conditions and care that is complete and accurate to the best of my ability. I acknowledge that Practitioner's advice, recommendations, and/or decision may be based on factors not within their control, such as incomplete or inaccurate data provided by me or distortions of diagnostic images or specimens that may result from electronic transmissions. I understand that the practice of medicine is not an exact science and that Practitioner makes no warranties or guarantees regarding treatment outcomes. I acknowledge that a copy of this consent can be made available to me via my patient portal ([facility]), or I can request a printed copy by calling the office of [place].    I understand that my insurance will be billed for this visit.   I have read or had this consent read to me. . I understand the contents of this consent, which adequately explains the benefits and risks of the Services being provided via telemedicine.  . I have been provided ample opportunity to ask questions regarding this consent and the Services and have had my questions answered to my satisfaction. . I give my informed consent for the services to be provided through the use of telemedicine in my medical care

## 2019-10-28 ENCOUNTER — Ambulatory Visit: Payer: Self-pay | Attending: Internal Medicine

## 2019-10-28 DIAGNOSIS — Z23 Encounter for immunization: Secondary | ICD-10-CM

## 2019-10-28 NOTE — Progress Notes (Signed)
   Covid-19 Vaccination Clinic  Name:  Mary Mcfarland    MRN: [id number] DOB: [birth year]  10/28/2019  Mary Mcfarland was observed post Covid-19 immunization for 15 minutes without incident. She was provided with Vaccine Information Sheet and instruction to access the V-Safe system.   Mary Mcfarland was instructed to call 911 with any severe reactions post vaccine: Marland Kitchen Difficulty breathing  . Swelling of face and throat  . A fast heartbeat  . A bad rash all over body  . Dizziness and weakness   Immunizations Administered    Name Date Dose VIS Date Route   Pfizer COVID-19 Vaccine 10/28/2019 10:32 AM 0.3 mL 08/07/2018 Intramuscular   Manufacturer: [place]   Lot: [id number]   [place]: [id number]

## 2019-11-05 ENCOUNTER — Ambulatory Visit: Payer: BLUE CROSS/BLUE SHIELD | Admitting: Cardiovascular Disease

## 2019-11-06 ENCOUNTER — Ambulatory Visit: Payer: Self-pay | Admitting: Internal Medicine

## 2019-11-13 ENCOUNTER — Other Ambulatory Visit: Payer: Self-pay

## 2019-11-13 ENCOUNTER — Ambulatory Visit: Payer: PPO | Admitting: Student

## 2019-11-13 ENCOUNTER — Telehealth: Payer: Self-pay | Admitting: *Deleted

## 2019-11-13 ENCOUNTER — Encounter: Payer: Self-pay | Admitting: Student

## 2019-11-13 VITALS — BP 140/68 | HR 69 | Ht 66.0 in | Wt 272.6 lb

## 2019-11-13 DIAGNOSIS — I4811 Longstanding persistent atrial fibrillation: Secondary | ICD-10-CM | POA: Diagnosis not present

## 2019-11-13 DIAGNOSIS — E782 Mixed hyperlipidemia: Secondary | ICD-10-CM | POA: Diagnosis not present

## 2019-11-13 DIAGNOSIS — I10 Essential (primary) hypertension: Secondary | ICD-10-CM

## 2019-11-13 DIAGNOSIS — Z79899 Other long term (current) drug therapy: Secondary | ICD-10-CM | POA: Diagnosis not present

## 2019-11-13 DIAGNOSIS — I4819 Other persistent atrial fibrillation: Secondary | ICD-10-CM | POA: Diagnosis not present

## 2019-11-13 DIAGNOSIS — R0683 Snoring: Secondary | ICD-10-CM

## 2019-11-13 LAB — COMPREHENSIVE METABOLIC PANEL
ALT: 18 IU/L (ref 0–32)
AST: 15 IU/L (ref 0–40)
Albumin/Globulin Ratio: 1.7 (ref 1.2–2.2)
Albumin: 4.5 g/dL (ref 3.8–4.8)
Alkaline Phosphatase: 103 IU/L (ref 48–121)
BUN/Creatinine Ratio: 23 (ref 12–28)
BUN: 21 mg/dL (ref 8–27)
Bilirubin Total: 0.4 mg/dL (ref 0.0–1.2)
CO2: 25 mmol/L (ref 20–29)
Calcium: 10.4 mg/dL — ABNORMAL HIGH (ref 8.7–10.3)
Chloride: 103 mmol/L (ref 96–106)
Creatinine, Ser: 0.9 mg/dL (ref 0.57–1.00)
GFR calc Af Amer: 79 mL/min/{1.73_m2} (ref >59)
GFR calc non Af Amer: 69 mL/min/{1.73_m2} (ref >59)
Globulin, Total: 2.7 g/dL (ref 1.5–4.5)
Glucose: 121 mg/dL — ABNORMAL HIGH (ref 65–99)
Potassium: 4.5 mmol/L (ref 3.5–5.2)
Sodium: 145 mmol/L — ABNORMAL HIGH (ref 134–144)
Total Protein: 7.2 g/dL (ref 6.0–8.5)

## 2019-11-13 LAB — LIPID PANEL
Chol/HDL Ratio: 3.1 ratio (ref 0.0–4.4)
Cholesterol, Total: 175 mg/dL (ref 100–199)
HDL: 56 mg/dL (ref >39)
LDL Chol Calc (NIH): 101 mg/dL — ABNORMAL HIGH (ref 0–99)
Triglycerides: 101 mg/dL (ref 0–149)
VLDL Cholesterol Cal: 18 mg/dL (ref 5–40)

## 2019-11-13 LAB — MAGNESIUM: Magnesium: 2 mg/dL (ref 1.6–2.3)

## 2019-11-13 LAB — TSH: TSH: 3.33 u[IU]/mL (ref 0.450–4.500)

## 2019-11-13 MED ORDER — METOPROLOL SUCCINATE ER 25 MG PO TB24: ORAL_TABLET | ORAL | 3 refills | Status: DC

## 2019-11-13 NOTE — Telephone Encounter (Signed)
Staff message sent to Mary Mcfarland ok to schedule HST. Per chart patient has no insurance.

## 2019-11-13 NOTE — Patient Instructions (Addendum)
Medication Instructions:  STOP METOPROLOL TARTRATE  START METOPROLOL SUCCINATE 12.5 mg AT BEDTIME *If you need a refill on your cardiac medications before your next appointment, please call your pharmacy*   Lab Work:  TODAY CMET MAGNESIUM TSH LIPIDS If you have labs (blood work) drawn today and your tests are completely normal, you will receive your results only by: Marland Kitchen MyChart Message (if you have MyChart) OR . A paper copy in the mail If you have any lab test that is abnormal or we need to change your treatment, we will call you to review the results.   Testing/Procedures: Your physician has recommended that you have a sleep study. This test records several body functions during sleep, including: brain activity, eye movement, oxygen and carbon dioxide blood levels, heart rate and rhythm, breathing rate and rhythm, the flow of air through your mouth and nose, snoring, body muscle movements, and chest and belly movement.     Follow-Up: 3 MONTHS Afib Clinic (Please schedule) At [facility], you and your health needs are our priority.  As part of our continuing mission to provide you with exceptional heart care, we have created designated Provider Care Teams.  These Care Teams include your primary Cardiologist (physician) and Advanced Practice Providers (APPs -  Physician Assistants and Nurse Practitioners) who all work together to provide you with the care you need, when you need it.     Other Instructions

## 2019-11-13 NOTE — Progress Notes (Signed)
PCP:  Lawerance Cruel, MD Primary Cardiologist: Quay Burow, MD Electrophysiologist: Thompson Grayer, MD   Mary Mcfarland is a 64 y.o. female seen today for Thompson Grayer, MD for routine electrophysiology followup.  Since last being seen in our clinic the patient reports doing about the same overall. She is struggling with SOB and is pending ischemic eval now that her insurance is in place. She denies SOB with her ADLs. She can get around the house including carrying a laundry basket without difficulty. No SOB in a store. She is very SOB with stairs on an incline. She denies orthopnea  she denies chest pain, palpitations, PND, nausea, vomiting, dizziness, syncope, edema, weight gain, or early satiety.  Past Medical History:  Diagnosis Date  . Asthma   . Bipolar disorder ([place])   . Difficult intubation    Narrow airway  . Hypertension   . Longstanding persistent atrial fibrillation ([place])   . Sleep apnea    sleep study was "inconclusive" per patient  . Visit for monitoring Tikosyn therapy 08/27/2019   Past Surgical History:  Procedure Laterality Date  . bone spur surgery    . CARDIOVERSION N/A 07/24/2019   Procedure: CARDIOVERSION;  Surgeon: Buford Dresser, MD;  Location: [facility] ENDOSCOPY;  Service: Cardiovascular;  Laterality: N/A;  . COLONOSCOPY    . HEEL SPUR RESECTION Left 03/15/2016   Procedure: ACHILLIES TENDON REPAIR AND PUMP BUMP EXCISION;  Surgeon: Melrose Nakayama, MD;  Location: [place];  Service: Orthopedics;  Laterality: Left;  . TONSILLECTOMY      Current Outpatient Medications  Medication Sig Dispense Refill  . albuterol (PROVENTIL HFA;VENTOLIN HFA) 108 (90 Base) MCG/ACT inhaler Inhale 2 puffs into the lungs every 6 (six) hours as needed for wheezing or shortness of breath.    Marland Kitchen apixaban (ELIQUIS) 5 MG TABS tablet Take 1 tablet (5 mg total) by mouth 2 (two) times daily. 180 tablet 1  . atorvastatin (LIPITOR) 40 MG tablet Take 1 tablet (40 mg total) by mouth  daily. 90 tablet 3  . buPROPion (WELLBUTRIN SR) 150 MG 12 hr tablet Take 300 mg by mouth daily.    Marland Kitchen dofetilide (TIKOSYN) 250 MCG capsule Take 1 capsule (250 mcg total) by mouth 2 (two) times daily. 60 capsule 2  . furosemide (LASIX) 40 MG tablet Take 1 tablet (40 mg total) by mouth daily. 90 tablet 3  . ibuprofen (ADVIL) 800 MG tablet Take 800 mg by mouth at bedtime as needed (for pain).     Marland Kitchen lisinopril (ZESTRIL) 20 MG tablet Take 20 mg by mouth daily.    . metoprolol tartrate (LOPRESSOR) 25 MG tablet Take 0.5 tablets (12.5 mg total) by mouth 2 (two) times daily. 90 tablet 3  . Naphazoline-Pheniramine (OPCON-A) 0.027-0.315 % SOLN Place 1-2 drops into both eyes 3 (three) times daily as needed (for allergies).     Marland Kitchen omeprazole (PRILOSEC) 20 MG capsule Take 20 mg by mouth daily before breakfast.      No current facility-administered medications for this visit.    Allergies  Allergen Reactions  . Niacin Hives  . Horse-Derived Products Swelling and Other (See Comments)    Eyes swell     Social History   Socioeconomic History  . Marital status: Single    Spouse name: Not on file  . Number of children: Not on file  . Years of education: Not on file  . Highest education level: Not on file  Occupational History  . Not on file  Tobacco Use  .  Smoking status: Former Smoker    Packs/day: 1.00    Types: Cigarettes    Quit date: 02/13/1996    Years since quitting: 23.7  . Smokeless tobacco: Never Used  Substance and Sexual Activity  . Alcohol use: Yes    Alcohol/week: 2.0 standard drinks    Types: 2 Shots of liquor per week    Comment: rarely  . Drug use: Never  . Sexual activity: Not on file  Other Topics Concern  . 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 Difficulty of Paying Living Expenses:   Food Insecurity:   . Worried About Sales executive in the Last Year:   . Arboriculturist in the Last Year:   Transportation Needs:   . Film/video editor (Medical):   Marland Kitchen Lack of Transportation (Non-Medical):   Physical Activity:   . Days of Exercise per Week:   . Minutes of Exercise per Session:   Stress:   . Feeling of Stress :   Social Connections:   . Frequency of Communication with Friends and Family:   . Frequency of Social Gatherings with Friends and Family:   . Attends Religious Services:   . Active Member of Clubs or Organizations:   . Attends Archivist Meetings:   Marland Kitchen Marital Status:   Intimate Partner Violence:   . Fear of Current or Ex-Partner:   . Emotionally Abused:   Marland Kitchen Physically Abused:   . Sexually Abused:    Review of Systems: All other systems reviewed and are otherwise negative except as noted above.  Physical Exam: Vitals:   11/13/19 0756  BP: 140/68  Pulse: 69  SpO2: 97%  Weight: 272 lb 9.6 oz (123.7 kg)  Height: 5\' 6"  (1.676 m)    GEN- The patient is well appearing, alert and oriented x 3 today.   HEENT: normocephalic, atraumatic; sclera clear, conjunctiva pink; hearing intact; oropharynx clear; neck supple, no JVP Lymph- no cervical lymphadenopathy Lungs- Clear to ausculation bilaterally, normal work of breathing.  No wheezes, rales, rhonchi Heart- Regular rate and rhythm, no murmurs, rubs or gallops, PMI not laterally displaced GI- soft, non-tender, non-distended, bowel sounds present, no hepatosplenomegaly Extremities- no clubbing, cyanosis, or edema; DP/PT/radial pulses 2+ bilaterally MS- no significant deformity or atrophy Skin- warm and dry, no rash or lesion Psych- euthymic mood, full affect Neuro- strength and sensation are intact  EKG is ordered. Personal review of EKG from today shows NSR at 69 bpm with sinus pause (~1.6 second). QTc stable around 450-460 ms when measured manually and corrected for rate and RBBB.  Additional studies reviewed include: Previous EP  notes, recent gen cards notes, AF clinic notes, prevoius EKGs  Assessment and Plan:  1. Persistent atrial fibrillation Remains in NSR on tikosyn.  Continue eliquis for CHA2DS2VASC of at least 2. She is pending work up for chest pain.   K 4.2 and Mg 2.2 4/22 With occasional pauses will change lopressor to toprol 12.5 mg once daily to take at bedtime. She has occasional lightheadedness, but is not having any currently.  Snores. Will also re-order sleep study that got lost to follow up.  2. Chronic diastolic CHF Echo [id number] showed LVEF 60-65% Edema well controlled currently on lasix NYHA III symptoms  3. Chest tightness/DOE Coronary Ca score of 22. She is pending coronary CT  once her insurance becomes active.  She sees Dr. Gwenlyn Found Friday to discuss further.  4. HTN Continue BB, lisinopril, and lasix. Could consider switch to Delene Loll now that insurance kicks in.  Labs today including labs requested by gen cards. Follow up in AF clinic 3 months. Sooner with more symptoms.   Shirley Friar, PA-C  11/13/19 8:21 AM

## 2019-11-15 ENCOUNTER — Ambulatory Visit: Payer: PPO | Admitting: Cardiovascular Disease

## 2019-11-15 ENCOUNTER — Other Ambulatory Visit: Payer: Self-pay

## 2019-11-15 ENCOUNTER — Encounter: Payer: Self-pay | Admitting: Cardiovascular Disease

## 2019-11-15 ENCOUNTER — Other Ambulatory Visit (HOSPITAL_COMMUNITY): Payer: Self-pay | Admitting: *Deleted

## 2019-11-15 DIAGNOSIS — I4811 Longstanding persistent atrial fibrillation: Secondary | ICD-10-CM

## 2019-11-15 DIAGNOSIS — R072 Precordial pain: Secondary | ICD-10-CM | POA: Diagnosis not present

## 2019-11-15 DIAGNOSIS — G4719 Other hypersomnia: Secondary | ICD-10-CM

## 2019-11-15 DIAGNOSIS — R0683 Snoring: Secondary | ICD-10-CM

## 2019-11-15 DIAGNOSIS — E782 Mixed hyperlipidemia: Secondary | ICD-10-CM

## 2019-11-15 DIAGNOSIS — G4733 Obstructive sleep apnea (adult) (pediatric): Secondary | ICD-10-CM | POA: Diagnosis not present

## 2019-11-15 DIAGNOSIS — Z01812 Encounter for preprocedural laboratory examination: Secondary | ICD-10-CM | POA: Diagnosis not present

## 2019-11-15 DIAGNOSIS — I10 Essential (primary) hypertension: Secondary | ICD-10-CM

## 2019-11-15 MED ORDER — DOFETILIDE 250 MCG PO CAPS: 250.0000 ug | ORAL_CAPSULE | Freq: Two times a day (BID) | ORAL | 3 refills | Status: DC

## 2019-11-15 NOTE — Patient Instructions (Addendum)
Medication Instructions:  Your physician recommends that you continue on your current medications as directed. Please refer to the Current Medication list given to you today.  *If you need a refill on your cardiac medications before your next appointment, please call your pharmacy*   Lab Work: BMET to be completed 1 week prior to CT test If you have labs (blood work) drawn today and your tests are completely normal, you will receive your results only by: Marland Kitchen MyChart Message (if you have MyChart) OR . A paper copy in the mail If you have any lab test that is abnormal or we need to change your treatment, we will call you to review the results.   Testing/Procedures: Your physician has recommended that you have a sleep study. This test records several body functions during sleep, including: brain activity, eye movement, oxygen and carbon dioxide blood levels, heart rate and rhythm, breathing rate and rhythm, the flow of air through your mouth and nose, snoring, body muscle movements, and chest and belly movement. -- this will be pre-certified with your insurance first and then you will be called to schedule the test at either [facility] center or a home study -- Mariann Laster is our office sleep coordinator  Your physician has ordered a coronary CT. This is done at [place]: At [facility], you and your health needs are our priority.  As part of our continuing mission to provide you with exceptional heart care, we have created designated Provider Care Teams.  These Care Teams include your primary Cardiologist (physician) and Advanced Practice Providers (APPs -  Physician Assistants and Nurse Practitioners) who all work together to provide you with the care you need, when you need it.  We recommend signing up for the patient portal called "MyChart".  Sign up information is provided on this After Visit Summary.  MyChart is used to connect with patients for Virtual Visits  (Telemedicine).  Patients are able to view lab/test results, encounter notes, upcoming appointments, etc.  Non-urgent messages can be sent to your provider as well.   To learn more about what you can do with MyChart, go to [email or web address].    Your next appointment:   3 months with Daleen Snook PA 6 months with Dr. Andria Rhein have been referred to Dr. Dennard Nip [facility] [address] [place]. [place], [place] [address] [phone]    Other Instructions Your cardiac CT will be scheduled at one of the below locations:   [facility] [address] [place], [place] [address] [phone]  [place] [address] [place], [place] [address] [phone]  If scheduled at [facility], please arrive at the [facility] main entrance of [facility] 30 minutes prior to test start time. Proceed to the [facility] Radiology Department (first floor) to check-in and test prep.  If scheduled at [facility], please arrive 15 mins early for check-in and test prep.  Please follow these instructions carefully (unless otherwise directed):   On the Night Before the Test: . Be sure to Drink plenty of water. . Do not consume any caffeinated/decaffeinated beverages or chocolate 12 hours prior to your test. . Do not take any antihistamines 12 hours prior to your test.  On the Day of the Test: . Drink plenty of water. Do not drink any water within one hour of the test. . Do not eat any food  4 hours prior to the test. . You may take your regular medications prior to the test.  . FEMALES- please wear underwire-free bra if available       After the Test: . Drink plenty of water. . After receiving IV contrast, you may experience a mild flushed feeling. This is normal. . On occasion, you may experience a mild rash up to 24 hours after the test. This is not dangerous. If  this occurs, you can take Benadryl 25 mg and increase your fluid intake. . If you experience trouble breathing, this can be serious. If it is severe call 911 IMMEDIATELY. If it is mild, please call our office. . If you take any of these medications: Glipizide/Metformin, Avandament, Glucavance, please do not take 48 hours after completing test unless otherwise instructed.   Once we have confirmed authorization from your insurance company, we will call you to set up a date and time for your test.   For non-scheduling related questions, please contact the cardiac imaging nurse navigator should you have any questions/concerns: Sara Wallace, Cardiac Imaging Nurse Navigator Merle Tai, Interim Cardiac Imaging Nurse Navigator [facility] Direct Office Dial: [phone]   For scheduling needs, including cancellations and rescheduling, please call [phone].     

## 2019-11-15 NOTE — Assessment & Plan Note (Signed)
History of symptomatic atrial fibrillation on Eliquis oral anticoagulation status post recent admission for Tikosyn load.  She is followed by Dr. Rayann Heman and Malka So, PA-C at the A. fib clinic.

## 2019-11-15 NOTE — Assessment & Plan Note (Signed)
History of essential hypertension blood pressure measured today 132/68.  She is on lisinopril and metoprolol.

## 2019-11-15 NOTE — Assessment & Plan Note (Signed)
History of nocturnal snoring and daytime somnolence along with a body habitus consistent with obstructive sleep apnea.  She also has PAF and sleep apnea most likely is a trigger for this as well.  We will refer her for an outpatient sleep study and CPAP if positive.

## 2019-11-15 NOTE — Progress Notes (Signed)
11/15/2019 LADENA JACQUEZ   [birth year]  [id number]  Primary Physician Lawerance Cruel, MD Primary Cardiologist: Lorretta Harp MD [name], [place]  HPI:  Mary Mcfarland is a 64 y.o.  morbidly overweight separated Caucasian female mother of 2 children, grandmother of 7 grandchildren referred by Dr. 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 Her risk factors include remote tobacco abuse having quit in 1997, treated hypertension and untreated hyperlipidemia. Her father did die of a myocardial infarction at age 43. She is never had a heart tach or stroke. She denies chest pain but over the last 3 weeks has noticed some increasing dyspnea on exertion. She is also noted some episodic dizziness. She did see her PCP, Dr. Harrington Challenger, on 06/19/2019 which time she was in A. fib with a ventricular spots of 130. She is placed on a beta-blocker for rate control on Eliquis oral anticoagulation (The CHA2DSVASC2 score is .2).  I placed her on Eliquis and metoprolol. Her rate is much better controlled and she feels clinically improved. 2D echo revealed normal LV systolic function with moderate left atrial enlargement.  Arrange for her to undergo outpatient cardioversion by Dr. Harrell Gave 07/24/2019.  She had 3 shocks and was unsuccessful in conversion.  She remains in atrial fibrillation rate controlled on metoprolol as well as on Eliquis oral anticoagulation.  She is somewhat symptomatic.  We will refer her to EP for further evaluation and consideration of A. fib ablation.  She was admitted for a Tikosyn load 08/27/2019 for 3 days by Dr. Rayann Heman.  She is followed by Malka So after that in the A. fib clinic.  She is presumably maintained sinus rhythm on Tikosyn as well as Eliquis and metoprolol.  She did have a 2D echo which was essentially normal and a  coronary calcium score of 22.  She was recently seen in the ER for chest pain and shortness of breath.    Current Meds  Medication Sig  . albuterol (PROVENTIL HFA;VENTOLIN HFA) 108 (90 Base) MCG/ACT inhaler Inhale 2 puffs into the lungs every 6 (six) hours as needed for wheezing or shortness of breath.  Marland Kitchen apixaban (ELIQUIS) 5 MG TABS tablet Take 1 tablet (5 mg total) by mouth 2 (two) times daily.  Marland Kitchen buPROPion (WELLBUTRIN SR) 150 MG 12 hr tablet Take 300 mg by mouth daily.  Marland Kitchen dofetilide (TIKOSYN) 250 MCG capsule Take 1 capsule (250 mcg total) by mouth 2 (two) times daily.  . furosemide (LASIX) 40 MG tablet Take 1 tablet (40 mg total) by mouth daily.  Marland Kitchen ibuprofen (ADVIL) 800 MG tablet Take 800 mg by mouth at bedtime as needed (for pain).   Marland Kitchen lisinopril (ZESTRIL) 20 MG tablet Take 20 mg by mouth daily.  . metoprolol succinate (TOPROL-XL) 25 MG 24 hr tablet Take 0.5 tablet 12.5 mg at bedtime  . Naphazoline-Pheniramine (OPCON-A) 0.027-0.315 % SOLN Place 1-2 drops into both eyes 3 (three) times daily as needed (for allergies).   Marland Kitchen omeprazole (PRILOSEC) 20 MG capsule Take 20 mg by mouth daily before breakfast.      Allergies  Allergen Reactions  . Niacin Hives  . Horse-Derived Products Swelling and Other (See Comments)    Eyes swell     Social History   Socioeconomic History  . Marital status: Single    Spouse name: Not on file  . Number of  children: Not on file  . Years of education: Not on file  . Highest education level: Not on file  Occupational History  . Not on file  Tobacco Use  . Smoking status: Former Smoker    Packs/day: 1.00    Types: Cigarettes    Quit date: 02/13/1996    Years since quitting: 23.7  . Smokeless tobacco: Never Used  Substance and Sexual Activity  . Alcohol use: Yes    Alcohol/week: 2.0 standard drinks    Types: 2 Shots of liquor per week    Comment: rarely  . Drug use: Never  . Sexual activity: Not on file  Other Topics Concern  . 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 Difficulty of Paying Living Expenses:   Food Insecurity:   . Worried About Charity fundraiser in the Last Year:   . Arboriculturist in the Last Year:   Transportation Needs:   . Film/video editor (Medical):   Marland Kitchen Lack of Transportation (Non-Medical):   Physical Activity:   . Days of Exercise per Week:   . Minutes of Exercise per Session:   Stress:   . Feeling of Stress :   Social Connections:   . Frequency of Communication with Friends and Family:   . Frequency of Social Gatherings with Friends and Family:   . Attends Religious Services:   . Active Member of Clubs or Organizations:   . Attends Archivist Meetings:   Marland Kitchen Marital Status:   Intimate Partner Violence:   . Fear of Current or Ex-Partner:   . Emotionally Abused:   Marland Kitchen Physically Abused:   . Sexually Abused:      Review of Systems: General: negative for chills, fever, night sweats or weight changes.  Cardiovascular: negative for chest pain, dyspnea on exertion, edema, orthopnea, palpitations, paroxysmal nocturnal dyspnea or shortness of breath Dermatological: negative for rash Respiratory: negative for cough or wheezing Urologic: negative for hematuria Abdominal: negative for nausea, vomiting, diarrhea, bright red blood per rectum, melena, or hematemesis Neurologic: negative for visual changes, syncope, or dizziness All other systems reviewed and are otherwise negative except as noted above.    Blood pressure 132/68, pulse (!) 108, height 5\' 6"  (1.676 m), weight 274 lb (124.3 kg), last menstrual period 09/24/2013, SpO2 95 %.  General appearance: alert and no distress Neck: no adenopathy, no carotid bruit, no JVD, supple, symmetrical, trachea midline and thyroid not enlarged, symmetric, no tenderness/mass/nodules Lungs: clear to auscultation  bilaterally Heart: Mildly tachycardic today Extremities: extremities normal, atraumatic, no cyanosis or edema Pulses: 2+ and symmetric Skin: Skin color, texture, turgor normal. No rashes or lesions Neurologic: Alert and oriented X 3, normal strength and tone. Normal symmetric reflexes. Normal coordination and gait  EKG sinus bradycardia 47 with sinus arrhythmia, PACs and right bundle branch block.  I personally reviewed this EKG.   ASSESSMENT AND PLAN:   Atrial fibrillation (HCC) History of symptomatic atrial fibrillation on Eliquis oral anticoagulation status post recent admission for Tikosyn load.  She is followed by Dr. Rayann Heman and Malka So, PA-C at the A. fib clinic.  Essential hypertension History of essential hypertension blood pressure measured today 132/68.  She is on lisinopril and metoprolol.  Hyperlipidemia History of hyperlipidemia on statin therapy with recent lipid profile performed 11/13/2019 revealing total of 175, LDL  of 101, down 30% from the prior reading 4 months ago, and HDL 56.  Morbid obesity ([place]) Ms. Arline Asp has gained 10 pounds since I originally saw her 3 months ago.  I am referring her to a diet and wellness for weight loss which I think will dramatically impact her symptoms of obstructive sleep apnea and risk of going back into atrial fibrillation.  Obstructive sleep apnea History of nocturnal snoring and daytime somnolence along with a body habitus consistent with obstructive sleep apnea.  She also has PAF and sleep apnea most likely is a trigger for this as well.  We will refer her for an outpatient sleep study and CPAP if positive.      Lorretta Harp MD FACP,FACC,FAHA, [facility] 11/15/2019 8:52 AM

## 2019-11-15 NOTE — Assessment & Plan Note (Signed)
History of hyperlipidemia on statin therapy with recent lipid profile performed 11/13/2019 revealing total of 175, LDL of 101, down 30% from the prior reading 4 months ago, and HDL 56.

## 2019-11-15 NOTE — Assessment & Plan Note (Signed)
Ms. Mary Mcfarland has gained 10 pounds since I originally saw her 3 months ago.  I am referring her to a diet and wellness for weight loss which I think will dramatically impact her symptoms of obstructive sleep apnea and risk of going back into atrial fibrillation.

## 2019-11-20 ENCOUNTER — Ambulatory Visit (HOSPITAL_COMMUNITY): Payer: Self-pay | Admitting: Licensed Clinical Social Worker

## 2019-11-21 ENCOUNTER — Telehealth: Payer: Self-pay | Admitting: *Deleted

## 2019-11-21 ENCOUNTER — Telehealth: Payer: Self-pay | Admitting: Internal Medicine

## 2019-11-21 DIAGNOSIS — I459 Conduction disorder, unspecified: Secondary | ICD-10-CM | POA: Diagnosis not present

## 2019-11-21 DIAGNOSIS — G4719 Other hypersomnia: Secondary | ICD-10-CM

## 2019-11-21 DIAGNOSIS — R5381 Other malaise: Secondary | ICD-10-CM | POA: Diagnosis not present

## 2019-11-21 DIAGNOSIS — I4891 Unspecified atrial fibrillation: Secondary | ICD-10-CM | POA: Diagnosis not present

## 2019-11-21 NOTE — Telephone Encounter (Signed)
Patient is aware and agreeable to Home Sleep Study through [facility]. Patient is scheduled for 01/30/20 at 11 am to pick up home sleep kit and meet with Respiratory therapist at [facility]. Patient is aware that if this appointment date and time does not work for them they should contact Artis Delay directly at [phone]. Patient is aware that a sleep packet will be sent from [facility] in week. Patient is agreeable to treatment and thankful for call.

## 2019-11-21 NOTE — Telephone Encounter (Signed)
Also left message for April at [facility] - Dr Harrington Challenger was concerned regarding 3 second pause on EKG rhythm strip - reviewed with Roderic Palau NP pt having very frequent PACs. Pt had no symptoms of pre-syncope at time of call.

## 2019-11-21 NOTE — Telephone Encounter (Signed)
This came in as DOD call, when Dr. Curt Bears picked up to speak w/ MD they were not there.  Will await to see if they call back.

## 2019-11-21 NOTE — Telephone Encounter (Signed)
New Message   April from [place] [facility] [employer] is calling and says the pt is there in the office and had an abnormal EKG. She says there was a long pause and would like to speak with a nurse regarding this    Please advise

## 2019-11-21 NOTE — Telephone Encounter (Signed)
-----   Message from Lauralee Evener, [place] sent at 11/13/2019  9:10 AM EDT ----- Regarding: RE: [place] to schedule HST. Per chart patient has no insurance. ----- Message ----- From: Frederik Schmidt, RN Sent: 11/13/2019   8:40 AM EDT To: Freada Bergeron, CMA, Cv Div Sleep Studies Subject: Sllep Study Home                               Please schedule at Home Sleep Study per Oda Kilts, PA  Snoring  Thank you

## 2019-11-21 NOTE — Telephone Encounter (Signed)
Patient called stating she saw her PCP Dr. Harrington Challenger today - concerned over EKG which showed HR of 44 sinus bradycardia with frequent PACs. RBBB. Discussed with Roderic Palau NP will stop metoprolol as pt states succinate has worsened her symptoms of dizziness and shortness of breath.  Will see in office on Monday. ER Precautions were reviewed with patient. Pt in agreement and verbalized understanding.

## 2019-11-22 NOTE — Telephone Encounter (Signed)
LMTCB

## 2019-11-25 ENCOUNTER — Other Ambulatory Visit: Payer: Self-pay

## 2019-11-25 ENCOUNTER — Encounter (HOSPITAL_COMMUNITY): Payer: Self-pay | Admitting: Physician Assistant

## 2019-11-25 ENCOUNTER — Ambulatory Visit (HOSPITAL_COMMUNITY)
Admission: RE | Admit: 2019-11-25 | Discharge: 2019-11-25 | Disposition: A | Discharge status: Home / Self Care | Payer: PPO | Source: Ambulatory Visit | Attending: Physician Assistant | Admitting: Physician Assistant

## 2019-11-25 VITALS — BP 132/82 | HR 108 | Ht 66.0 in | Wt 273.0 lb

## 2019-11-25 DIAGNOSIS — Z791 Long term (current) use of non-steroidal anti-inflammatories (NSAID): Secondary | ICD-10-CM | POA: Insufficient documentation

## 2019-11-25 DIAGNOSIS — Z6841 Body Mass Index (BMI) 40.0 and over, adult: Secondary | ICD-10-CM | POA: Insufficient documentation

## 2019-11-25 DIAGNOSIS — E669 Obesity, unspecified: Secondary | ICD-10-CM | POA: Diagnosis not present

## 2019-11-25 DIAGNOSIS — G4730 Sleep apnea, unspecified: Secondary | ICD-10-CM | POA: Diagnosis not present

## 2019-11-25 DIAGNOSIS — Z886 Allergy status to analgesic agent status: Secondary | ICD-10-CM | POA: Diagnosis not present

## 2019-11-25 DIAGNOSIS — F319 Bipolar disorder, unspecified: Secondary | ICD-10-CM | POA: Diagnosis not present

## 2019-11-25 DIAGNOSIS — I484 Atypical atrial flutter: Secondary | ICD-10-CM | POA: Diagnosis not present

## 2019-11-25 DIAGNOSIS — J45909 Unspecified asthma, uncomplicated: Secondary | ICD-10-CM | POA: Insufficient documentation

## 2019-11-25 DIAGNOSIS — Z79899 Other long term (current) drug therapy: Secondary | ICD-10-CM | POA: Insufficient documentation

## 2019-11-25 DIAGNOSIS — Z7901 Long term (current) use of anticoagulants: Secondary | ICD-10-CM | POA: Diagnosis not present

## 2019-11-25 DIAGNOSIS — Z87891 Personal history of nicotine dependence: Secondary | ICD-10-CM | POA: Insufficient documentation

## 2019-11-25 DIAGNOSIS — I4811 Longstanding persistent atrial fibrillation: Secondary | ICD-10-CM | POA: Insufficient documentation

## 2019-11-25 DIAGNOSIS — Z8249 Family history of ischemic heart disease and other diseases of the circulatory system: Secondary | ICD-10-CM | POA: Insufficient documentation

## 2019-11-25 DIAGNOSIS — I10 Essential (primary) hypertension: Secondary | ICD-10-CM | POA: Insufficient documentation

## 2019-11-25 MED ORDER — DILTIAZEM HCL ER COATED BEADS 120 MG PO CP24: 120.0000 mg | ORAL_CAPSULE | Freq: Every day | ORAL | 3 refills | Status: DC

## 2019-11-25 NOTE — Progress Notes (Signed)
Primary Care Physician: Lawerance Cruel, MD Primary Cardiologist: Dr Gwenlyn Found  Primary Electrophysiologist: Dr Rayann Heman Referring Physician: Dr [name] is a 64 y.o. female with a history of HTN, bipolar disorder, and persistent atrial fibrillation who presents for follow up in the [facility]. She was first diagnosed with atrial fibrillation in 2017 and then recurrence found in January of this year at PCP visit. She went in for evaluation of headaches, dizziness.  She was placed on BB and Eliquis.  She underwent attempted cardioversion in February which failed to restore SR. Patient is on Eliquis for a CHADS2VASC score of 3. She was evaluated by Dr Rayann Heman on 08/09/19 who recommended dofetilide. Patient is s/p dofetilide loading 3/16-3/19/21. She did not require DCCV.  On follow up today, patient was seen at her PCP office on 11/21/19 and found to be bradycardic HR 44 with PACs. She was also noted to have a 3 second pause on rhythm strip. Her BB was stopped. Patient reports that her SOB has improved off of metoprolol. Unfortunately, she appears to be out of rhythm today. She is unaware of her arrhythmia.   Today, she denies symptoms of palpitations, chest pain, orthopnea, PND, lower extremity edema, dizziness, presyncope, syncope, snoring, daytime somnolence, bleeding, or neurologic sequela. The patient is tolerating medications without difficulties and is otherwise without complaint today.    Atrial Fibrillation Risk Factors:  she does have symptoms or diagnosis of sleep apnea. Sleep study pending. she does not have a history of rheumatic fever. she does not have a history of alcohol use.   she has a BMI of Body mass index is 44.06 kg/m.Marland Kitchen Filed Weights   11/25/19 0836  Weight: 123.8 kg    Family History  Problem Relation Age of Onset  . Heart disease Mother   . CVA Mother   . Hypertension Mother   . Cancer Mother   . Heart disease  Father   . Hypertension Father   . Heart attack Father      Atrial Fibrillation Management history:  Previous antiarrhythmic drugs: dofetilide Previous cardioversions: 07/24/19 Previous ablations: none CHADS2VASC score: 2 Anticoagulation history: Eliquis   Past Medical History:  Diagnosis Date  . Asthma   . Bipolar disorder ([place])   . Difficult intubation    Narrow airway  . Hypertension   . Longstanding persistent atrial fibrillation ([place])   . Sleep apnea    sleep study was "inconclusive" per patient  . Visit for monitoring Tikosyn therapy 08/27/2019   Past Surgical History:  Procedure Laterality Date  . bone spur surgery    . CARDIOVERSION N/A 07/24/2019   Procedure: CARDIOVERSION;  Surgeon: Buford Dresser, MD;  Location: [facility] ENDOSCOPY;  Service: Cardiovascular;  Laterality: N/A;  . COLONOSCOPY    . HEEL SPUR RESECTION Left 03/15/2016   Procedure: ACHILLIES TENDON REPAIR AND PUMP BUMP EXCISION;  Surgeon: Melrose Nakayama, MD;  Location: [facility];  Service: Orthopedics;  Laterality: Left;  . TONSILLECTOMY      Current Outpatient Medications  Medication Sig Dispense Refill  . albuterol (PROVENTIL HFA;VENTOLIN HFA) 108 (90 Base) MCG/ACT inhaler Inhale 2 puffs into the lungs every 6 (six) hours as needed for wheezing or shortness of breath.    Marland Kitchen apixaban (ELIQUIS) 5 MG TABS tablet Take 1 tablet (5 mg total) by mouth 2 (two) times daily. 180 tablet 1  . atorvastatin (LIPITOR) 40 MG tablet Take 1 tablet (40 mg total) by mouth daily. 90 tablet  3  . buPROPion (WELLBUTRIN SR) 150 MG 12 hr tablet Take 300 mg by mouth daily.    Marland Kitchen dofetilide (TIKOSYN) 250 MCG capsule Take 1 capsule (250 mcg total) by mouth 2 (two) times daily. 60 capsule 3  . furosemide (LASIX) 40 MG tablet Take 1 tablet (40 mg total) by mouth daily. 90 tablet 3  . ibuprofen (ADVIL) 800 MG tablet Take 800 mg by mouth at bedtime as needed (for pain).     Marland Kitchen lisinopril (ZESTRIL) 20 MG tablet Take 20 mg by mouth daily.     . Naphazoline-Pheniramine (OPCON-A) 0.027-0.315 % SOLN Place 1-2 drops into both eyes 3 (three) times daily as needed (for allergies).     Marland Kitchen omeprazole (PRILOSEC) 20 MG capsule Take 20 mg by mouth daily before breakfast.     . diltiazem (CARDIZEM CD) 120 MG 24 hr capsule Take 1 capsule (120 mg total) by mouth daily. 30 capsule 3   No current facility-administered medications for this encounter.    Allergies  Allergen Reactions  . Niacin Hives  . Horse-Derived Products Swelling and Other (See Comments)    Eyes swell     Social History   Socioeconomic History  . Marital status: Single    Spouse name: Not on file  . Number of children: Not on file  . Years of education: Not on file  . Highest education level: Not on file  Occupational History  . Not on file  Tobacco Use  . Smoking status: Former Smoker    Packs/day: 1.00    Types: Cigarettes    Quit date: 02/13/1996    Years since quitting: 23.7  . Smokeless tobacco: Never Used  Vaping Use  . Vaping Use: Never used  Substance and Sexual Activity  . Alcohol use: Yes    Alcohol/week: 3.0 - 4.0 standard drinks    Types: 2 Shots of liquor, 1 - 2 Standard drinks or equivalent per week    Comment: occassionally  . Drug use: Never  . Sexual activity: Not on file  Other Topics Concern  . 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 Difficulty of Paying Living Expenses:   Food Insecurity:   . Worried About Charity fundraiser in the Last Year:   . Arboriculturist in the Last Year:   Transportation Needs:   . Film/video editor (Medical):   Marland Kitchen Lack of Transportation (Non-Medical):   Physical Activity:   . Days of Exercise per Week:   . Minutes of Exercise per Session:   Stress:   . Feeling of Stress :   Social Connections:   . Frequency of Communication with Friends and Family:   .  Frequency of Social Gatherings with Friends and Family:   . Attends Religious Services:   . Active Member of Clubs or Organizations:   . Attends Archivist Meetings:   Marland Kitchen Marital Status:   Intimate Partner Violence:   . Fear of Current or Ex-Partner:   . Emotionally Abused:   Marland Kitchen Physically Abused:   . Sexually Abused:      ROS- All systems are reviewed and negative except as per the HPI above.  Physical Exam: Vitals:   11/25/19 0836  BP: 132/82  Pulse: (!) 108  Weight: 123.8 kg  Height: 5\' 6"  (1.676 m)    GEN-  The patient is well appearing obese female, alert and oriented x 3 today.   HEENT-head normocephalic, atraumatic, sclera clear, conjunctiva pink, hearing intact, trachea midline. Lungs- Clear to ausculation bilaterally, normal work of breathing Heart- Regular rate and rhythm, tachycardia, no murmurs, rubs or gallops  GI- soft, NT, ND, + BS Extremities- no clubbing, cyanosis, or edema MS- no significant deformity or atrophy Skin- no rash or lesion Psych- euthymic mood, full affect Neuro- strength and sensation are intact   Wt Readings from Last 3 Encounters:  11/25/19 123.8 kg  11/15/19 124.3 kg  11/13/19 123.7 kg    EKG today demonstrates atypical atrial flutter HR 108, RBBB, QRS 132, QTc 501  Echo 07/01/19 demonstrated  1. Left ventricular ejection fraction, by visual estimation, is 60 to  65%. The left ventricle has normal function. There is mildly increased  left ventricular hypertrophy.  2. Left ventricular diastolic parameters are indeterminate.  3. The left ventricle has no regional wall motion abnormalities.  4. Global right ventricle has normal systolic function.The right  ventricular size is normal. No increase in right ventricular wall  thickness.  5. Left atrial size was moderately dilated.  6. Right atrial size was normal.  7. The mitral valve is normal in structure. Trivial mitral valve  regurgitation. No evidence of mitral  stenosis.  8. The tricuspid valve is normal in structure.  9. The tricuspid valve is normal in structure. Tricuspid valve  regurgitation is not demonstrated.  10. The aortic valve is tricuspid. Aortic valve regurgitation is not  visualized. Mild to moderate aortic valve sclerosis/calcification without  any evidence of aortic stenosis.  11. Calcified non coronary cusp.  12. The pulmonic valve was normal in structure. Pulmonic valve  regurgitation is not visualized.  13. The inferior vena cava is normal in size with greater than 50%  respiratory variability, suggesting right atrial pressure of 3 mmHg.   Epic records are reviewed at length today  CHA2DS2-VASc Score = 2 The patient's score is based upon: CHF History: No HTN History: Yes Age : < 65 Diabetes History: No Stroke History: No Vascular Disease History: No Gender: Female    ASSESSMENT AND PLAN: 1. Persistent Atrial Fibrillation/atypical atrial flutter The patient's CHA2DS2-VASc score is 2, indicating a 2.2% annual risk of stroke.   S/p dofetilide loading 3/16-3/19/21. Patient appears to be in atrial flutter today. Will not resume BB 2/2 SOB, ? Asthma related.  Will start diltiazem 120 mg daily. Return for ECG in one week to monitor for bradycardia. If she remains persistent, could consider DCCV. Continue 250 mcg BID.  Continue Eliquis 5 mg BID  2. Obesity Body mass index is 44.06 kg/m. Lifestyle modification was discussed and encouraged including regular physical activity and weight reduction.  3. Suspected OSA Sleep study scheduled for August 2021.  4. HTN Stable, med changes as above.    Follow up in the AF clinic in one week.    [facility] [address] [place], [place] [address] [phone] 11/25/2019 9:23 AM

## 2019-11-25 NOTE — Patient Instructions (Signed)
Start cardizem 120mg once a day 

## 2019-11-25 NOTE — Telephone Encounter (Signed)
LATE ENTRY: Pt called at 6:00 pm.    Pt chart sent to me since I left message re: her My Chart on 11/22/19 and she was unsure why I had called.. I advised her that I was going to address her message re: the CT Dr. Gwenlyn Found ordered and answer her questions and I was asking if she was able to view other med rec as per her My Chart with another persons' name on it... but the pt was very confused and was not sure where she had seen the name "Rockney Ghee" when viewing her My Chart. I also went over the information that [place] had sent to her re: waiting for the precert for her CT and that [place] had messaged the scheduler in follow up and she should be hearing from [place] very soon to schedule but the pt became angry and said she was dissatisfied with her care this far and unhappy with the precert process dealing with her appt this long. I apologized and advised her we will work very hard to continue to give her the care she deserves and follow the process so she does not get sent a large bill for not waiting for the insurance to okay. Pt said she understood and hung up.

## 2019-11-25 NOTE — Telephone Encounter (Signed)
Precert is still pending - we are currently working to clear up a backlog on Ct Orders that need authorization.  I will call her as soon as I get the authorization, it may be another week or two before I get the Authorization.,  Thanks  Romie Minus

## 2019-11-26 ENCOUNTER — Ambulatory Visit (HOSPITAL_COMMUNITY): Payer: HMO | Admitting: Psychiatry

## 2019-12-02 ENCOUNTER — Ambulatory Visit (HOSPITAL_COMMUNITY)
Admission: RE | Admit: 2019-12-02 | Discharge: 2019-12-02 | Disposition: A | Discharge status: Home / Self Care | Payer: PPO | Source: Ambulatory Visit | Attending: Physician Assistant | Admitting: Physician Assistant

## 2019-12-02 ENCOUNTER — Other Ambulatory Visit: Payer: Self-pay

## 2019-12-02 ENCOUNTER — Encounter (HOSPITAL_BASED_OUTPATIENT_CLINIC_OR_DEPARTMENT_OTHER): Payer: Self-pay

## 2019-12-02 VITALS — BP 120/60 | HR 96 | Ht 66.0 in | Wt 278.4 lb

## 2019-12-02 DIAGNOSIS — Z823 Family history of stroke: Secondary | ICD-10-CM | POA: Diagnosis not present

## 2019-12-02 DIAGNOSIS — F319 Bipolar disorder, unspecified: Secondary | ICD-10-CM | POA: Diagnosis not present

## 2019-12-02 DIAGNOSIS — Z8249 Family history of ischemic heart disease and other diseases of the circulatory system: Secondary | ICD-10-CM | POA: Insufficient documentation

## 2019-12-02 DIAGNOSIS — I4819 Other persistent atrial fibrillation: Secondary | ICD-10-CM | POA: Diagnosis not present

## 2019-12-02 DIAGNOSIS — Z6841 Body Mass Index (BMI) 40.0 and over, adult: Secondary | ICD-10-CM | POA: Diagnosis not present

## 2019-12-02 DIAGNOSIS — Z87891 Personal history of nicotine dependence: Secondary | ICD-10-CM | POA: Diagnosis not present

## 2019-12-02 DIAGNOSIS — J45909 Unspecified asthma, uncomplicated: Secondary | ICD-10-CM | POA: Diagnosis not present

## 2019-12-02 DIAGNOSIS — I110 Hypertensive heart disease with heart failure: Secondary | ICD-10-CM | POA: Insufficient documentation

## 2019-12-02 DIAGNOSIS — E669 Obesity, unspecified: Secondary | ICD-10-CM | POA: Diagnosis not present

## 2019-12-02 DIAGNOSIS — I5032 Chronic diastolic (congestive) heart failure: Secondary | ICD-10-CM | POA: Insufficient documentation

## 2019-12-02 DIAGNOSIS — Z888 Allergy status to other drugs, medicaments and biological substances status: Secondary | ICD-10-CM | POA: Diagnosis not present

## 2019-12-02 DIAGNOSIS — I484 Atypical atrial flutter: Secondary | ICD-10-CM | POA: Insufficient documentation

## 2019-12-02 DIAGNOSIS — I4891 Unspecified atrial fibrillation: Secondary | ICD-10-CM | POA: Diagnosis present

## 2019-12-02 LAB — CBC
HCT: 38.7 % (ref 36.0–46.0)
Hemoglobin: 12.4 g/dL (ref 12.0–15.0)
MCH: 29.2 pg (ref 26.0–34.0)
MCHC: 32 g/dL (ref 30.0–36.0)
MCV: 91.1 fL (ref 80.0–100.0)
Platelets: 299 10*3/uL (ref 150–400)
RBC: 4.25 MIL/uL (ref 3.87–5.11)
RDW: 13.1 % (ref 11.5–15.5)
WBC: 10.1 10*3/uL (ref 4.0–10.5)
nRBC: 0 % (ref 0.0–0.2)

## 2019-12-02 LAB — BASIC METABOLIC PANEL
Anion gap: 11 (ref 5–15)
BUN: 18 mg/dL (ref 8–23)
CO2: 25 mmol/L (ref 22–32)
Calcium: 9.1 mg/dL (ref 8.9–10.3)
Chloride: 105 mmol/L (ref 98–111)
Creatinine, Ser: 0.93 mg/dL (ref 0.44–1.00)
GFR calc Af Amer: >60 mL/min (ref >60)
GFR calc non Af Amer: >60 mL/min (ref >60)
Glucose, Bld: 134 mg/dL — ABNORMAL HIGH (ref 70–99)
Potassium: 3.8 mmol/L (ref 3.5–5.1)
Sodium: 141 mmol/L (ref 135–145)

## 2019-12-02 MED ORDER — APIXABAN 5 MG PO TABS: 5.0000 mg | ORAL_TABLET | Freq: Two times a day (BID) | ORAL | 5 refills | Status: DC

## 2019-12-02 NOTE — Progress Notes (Signed)
Primary Care Physician: Lawerance Cruel, MD Primary Cardiologist: Dr Gwenlyn Found  Primary Electrophysiologist: Dr Rayann Heman Referring Physician: Dr [name] is a 64 y.o. female with a history of HTN, bipolar disorder, and persistent atrial fibrillation who presents for follow up in the [facility]. She was first diagnosed with atrial fibrillation in 2017 and then recurrence found in January of this year at PCP visit. She went in for evaluation of headaches, dizziness.  She was placed on BB and Eliquis.  She underwent attempted cardioversion in February which failed to restore SR. Patient is on Eliquis for a CHADS2VASC score of 3. She was evaluated by Dr Rayann Heman on 08/09/19 who recommended dofetilide. Patient is s/p dofetilide loading 3/16-3/19/21. She did not require DCCV. Patient was seen at her PCP office on 11/21/19 and found to be bradycardic HR 44 with PACs. She was also noted to have a 3 second pause on rhythm strip. Her BB was stopped. Patient reports that her SOB has improved off of metoprolol.   On follow up today, patient reports that she is about the same since her last visit. She does have some SOB and edema. She denies bleeding issues on anticoagulation. She is tolerating the diltiazem without difficulty.   Today, she denies symptoms of palpitations, chest pain, orthopnea, PND, dizziness, presyncope, syncope, bleeding, or neurologic sequela. The patient is tolerating medications without difficulties and is otherwise without complaint today.    Atrial Fibrillation Risk Factors:  she does have symptoms or diagnosis of sleep apnea. Sleep study pending. she does not have a history of rheumatic fever. she does not have a history of alcohol use.   she has a BMI of Body mass index is 44.93 kg/m.Marland Kitchen Filed Weights   12/02/19 0840  Weight: 126.3 kg    Family History  Problem Relation Age of Onset  . Heart disease Mother   . CVA Mother   .  Hypertension Mother   . Cancer Mother   . Heart disease Father   . Hypertension Father   . Heart attack Father      Atrial Fibrillation Management history:  Previous antiarrhythmic drugs: dofetilide Previous cardioversions: 07/24/19 Previous ablations: none CHADS2VASC score: 2 Anticoagulation history: Eliquis   Past Medical History:  Diagnosis Date  . Asthma   . Bipolar disorder ([place])   . Difficult intubation    Narrow airway  . Hypertension   . Longstanding persistent atrial fibrillation ([place])   . Sleep apnea    sleep study was "inconclusive" per patient  . Visit for monitoring Tikosyn therapy 08/27/2019   Past Surgical History:  Procedure Laterality Date  . bone spur surgery    . CARDIOVERSION N/A 07/24/2019   Procedure: CARDIOVERSION;  Surgeon: Buford Dresser, MD;  [facility] ENDOSCOPY;  Service: Cardiovascular;  Laterality: N/A;  . COLONOSCOPY    . HEEL SPUR RESECTION Left 03/15/2016   Procedure: ACHILLIES TENDON REPAIR AND PUMP BUMP EXCISION;  Surgeon: Melrose Nakayama, MD;  Location: [facility];  Service: Orthopedics;  Laterality: Left;  . TONSILLECTOMY      Current Outpatient Medications  Medication Sig Dispense Refill  . albuterol (PROVENTIL HFA;VENTOLIN HFA) 108 (90 Base) MCG/ACT inhaler Inhale 2 puffs into the lungs every 6 (six) hours as needed for wheezing or shortness of breath.    Marland Kitchen apixaban (ELIQUIS) 5 MG TABS tablet Take 1 tablet (5 mg total) by mouth 2 (two) times daily. 180 tablet 1  . atorvastatin (LIPITOR) 40 MG  tablet Take 1 tablet (40 mg total) by mouth daily. 90 tablet 3  . buPROPion (WELLBUTRIN SR) 150 MG 12 hr tablet Take 300 mg by mouth daily.    Marland Kitchen diltiazem (CARDIZEM CD) 120 MG 24 hr capsule Take 1 capsule (120 mg total) by mouth daily. 30 capsule 3  . dofetilide (TIKOSYN) 250 MCG capsule Take 1 capsule (250 mcg total) by mouth 2 (two) times daily. 60 capsule 3  . furosemide (LASIX) 40 MG tablet Take 1 tablet (40 mg total) by mouth daily. 90  tablet 3  . ibuprofen (ADVIL) 800 MG tablet Take 800 mg by mouth at bedtime as needed (for pain).     Marland Kitchen lisinopril (ZESTRIL) 20 MG tablet Take 20 mg by mouth daily.    . Naphazoline-Pheniramine (OPCON-A) 0.027-0.315 % SOLN Place 1-2 drops into both eyes 3 (three) times daily as needed (for allergies).     Marland Kitchen omeprazole (PRILOSEC) 20 MG capsule Take 20 mg by mouth daily before breakfast.      No current facility-administered medications for this encounter.    Allergies  Allergen Reactions  . Niacin Hives  . Horse-Derived Products Swelling and Other (See Comments)    Eyes swell     Social History   Socioeconomic History  . Marital status: Single    Spouse name: Not on file  . Number of children: Not on file  . Years of education: Not on file  . Highest education level: Not on file  Occupational History  . Not on file  Tobacco Use  . Smoking status: Former Smoker    Packs/day: 1.00    Types: Cigarettes    Quit date: 02/13/1996    Years since quitting: 23.8  . Smokeless tobacco: Never Used  Vaping Use  . Vaping Use: Never used  Substance and Sexual Activity  . Alcohol use: Yes    Alcohol/week: 3.0 - 4.0 standard drinks    Types: 2 Shots of liquor, 1 - 2 Standard drinks or equivalent per week    Comment: occassionally  . Drug use: Never  . Sexual activity: Not on file  Other Topics Concern  . 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 Difficulty of Paying Living Expenses:   Food Insecurity:   . Worried About Charity fundraiser in the Last Year:   . Arboriculturist in the Last Year:   Transportation Needs:   . Film/video editor (Medical):   Marland Kitchen Lack of Transportation (Non-Medical):   Physical Activity:   . Days of Exercise per Week:   . Minutes of Exercise per Session:   Stress:   . Feeling of Stress :   Social Connections:   .  Frequency of Communication with Friends and Family:   . Frequency of Social Gatherings with Friends and Family:   . Attends Religious Services:   . Active Member of Clubs or Organizations:   . Attends Archivist Meetings:   Marland Kitchen Marital Status:   Intimate Partner Violence:   . Fear of Current or Ex-Partner:   . Emotionally Abused:   Marland Kitchen Physically Abused:   . Sexually Abused:      ROS- All systems are reviewed and negative except as per the HPI above.  Physical Exam: Vitals:   12/02/19 0840  BP: 120/60  Pulse: 96  Weight: 126.3  kg  Height: 5\' 6"  (1.676 m)    GEN- The patient is well appearing obese female, alert and oriented x 3 today.   HEENT-head normocephalic, atraumatic, sclera clear, conjunctiva pink, hearing intact, trachea midline. Lungs- Clear to ausculation bilaterally, normal work of breathing Heart- Regular rate and rhythm, no murmurs, rubs or gallops  GI- soft, NT, ND, + BS Extremities- no clubbing, cyanosis, or edema MS- no significant deformity or atrophy Skin- no rash or lesion Psych- euthymic mood, full affect Neuro- strength and sensation are intact   Wt Readings from Last 3 Encounters:  12/02/19 126.3 kg  11/25/19 123.8 kg  11/15/19 124.3 kg    EKG today demonstrates atypical atrial flutter HR 96, RBBB, QRS 126, QTc 525  Echo 07/01/19 demonstrated  1. Left ventricular ejection fraction, by visual estimation, is 60 to  65%. The left ventricle has normal function. There is mildly increased  left ventricular hypertrophy.  2. Left ventricular diastolic parameters are indeterminate.  3. The left ventricle has no regional wall motion abnormalities.  4. Global right ventricle has normal systolic function.The right  ventricular size is normal. No increase in right ventricular wall  thickness.  5. Left atrial size was moderately dilated.  6. Right atrial size was normal.  7. The mitral valve is normal in structure. Trivial mitral valve    regurgitation. No evidence of mitral stenosis.  8. The tricuspid valve is normal in structure.  9. The tricuspid valve is normal in structure. Tricuspid valve  regurgitation is not demonstrated.  10. The aortic valve is tricuspid. Aortic valve regurgitation is not  visualized. Mild to moderate aortic valve sclerosis/calcification without  any evidence of aortic stenosis.  11. Calcified non coronary cusp.  12. The pulmonic valve was normal in structure. Pulmonic valve  regurgitation is not visualized.  13. The inferior vena cava is normal in size with greater than 50%  respiratory variability, suggesting right atrial pressure of 3 mmHg.   Epic records are reviewed at length today  CHA2DS2-VASc Score = 2 The patient's score is based upon: CHF History: No HTN History: Yes Age : < 65 Diabetes History: No Stroke History: No Vascular Disease History: No Gender: Female    ASSESSMENT AND PLAN: 1. Persistent Atrial Fibrillation/atypical atrial flutter The patient's CHA2DS2-VASc score is 2, indicating a 2.2% annual risk of stroke.   S/p dofetilide loading 3/16-3/19/21. Patient remains in atypical atrial flutter. Will arrange for DCCV. Check bmet/CBC Continue diltiazem 120 mg daily Continue dofetilide 250 mcg BID Continue Eliquis 5 mg BID  2. Obesity Body mass index is 44.93 kg/m. Lifestyle modification was discussed and encouraged including regular physical activity and weight reduction.  3. Suspected OSA Sleep study scheduled for 01/2020.  4. HTN Stable, no changes today.  5. Chronic diastolic CHF Patient's weight is up since last week. Likely related to #1. Patient to increase lasix to 80 mg daily x3 days. Bmet as above.   Follow up with the AF clinic one week post DCCV.   [facility] [address] [place], [place] [address] [phone] 12/02/2019 9:54 AM

## 2019-12-02 NOTE — Addendum Note (Signed)
Encounter addended by: Enid Derry, CMA on: 12/02/2019 12:13 PM  Actions taken: Order Reconciliation Section accessed, Order list changed

## 2019-12-02 NOTE — Patient Instructions (Signed)
Cardioversion scheduled for Friday, June 25th  - Arrive at the [facility] and go to admitting at 6:30AM  - Do not eat or drink anything after midnight the night prior to your procedure.  - Take all your morning medication (except diabetic medications) with a sip of water prior to arrival.  - You will not be able to drive home after your procedure.  - Do NOT miss any doses of your blood thinner - if you should miss a dose please notify our office immediately.

## 2019-12-03 ENCOUNTER — Other Ambulatory Visit (HOSPITAL_COMMUNITY): Payer: Self-pay | Admitting: *Deleted

## 2019-12-03 MED ORDER — DOFETILIDE 250 MCG PO CAPS: 250.0000 ug | ORAL_CAPSULE | Freq: Two times a day (BID) | ORAL | 6 refills | Status: DC

## 2019-12-03 MED ORDER — POTASSIUM CHLORIDE CRYS ER 10 MEQ PO TBCR: 10.0000 meq | EXTENDED_RELEASE_TABLET | Freq: Every day | ORAL | Status: DC

## 2019-12-04 ENCOUNTER — Other Ambulatory Visit (HOSPITAL_COMMUNITY)
Admission: RE | Admit: 2019-12-04 | Discharge: 2019-12-04 | Disposition: A | Discharge status: Home / Self Care | Payer: PPO | Source: Ambulatory Visit | Attending: Cardiology | Admitting: Cardiology

## 2019-12-04 DIAGNOSIS — Z01812 Encounter for preprocedural laboratory examination: Secondary | ICD-10-CM | POA: Insufficient documentation

## 2019-12-04 DIAGNOSIS — Z20822 Contact with and (suspected) exposure to covid-19: Secondary | ICD-10-CM | POA: Insufficient documentation

## 2019-12-04 LAB — SARS CORONAVIRUS 2 (TAT 6-24 HRS): SARS Coronavirus 2: NEGATIVE

## 2019-12-05 ENCOUNTER — Encounter (HOSPITAL_COMMUNITY): Payer: Self-pay | Admitting: Cardiology

## 2019-12-05 NOTE — Anesthesia Preprocedure Evaluation (Addendum)
Anesthesia Evaluation  Patient identified by MRN, date of birth, ID band Patient awake    Reviewed: Allergy & Precautions, NPO status , Patient's Chart, lab work & pertinent test results, reviewed documented beta blocker date and time   History of Anesthesia Complications (+) DIFFICULT AIRWAY  Airway Mallampati: II  TM Distance: >3 FB Neck ROM: Full    Dental no notable dental hx. (+) Teeth Intact   Pulmonary asthma , sleep apnea and Continuous Positive Airway Pressure Ventilation , former smoker,    Pulmonary exam normal breath sounds clear to auscultation       Cardiovascular hypertension, Pt. on medications Normal cardiovascular exam Rhythm:Irregular Rate:Normal     Neuro/Psych PSYCHIATRIC DISORDERS Bipolar Disorder negative neurological ROS     GI/Hepatic Neg liver ROS, GERD  Medicated and Controlled,  Endo/Other  Morbid obesity  Renal/GU negative Renal ROS  negative genitourinary   Musculoskeletal negative musculoskeletal ROS (+)   Abdominal (+) + obese,   Peds  Hematology Eliquis therapy- last dose   Anesthesia Other Findings   Reproductive/Obstetrics                            Anesthesia Physical Anesthesia Plan  ASA: III  Anesthesia Plan: General   Post-op Pain Management:    Induction: Intravenous  PONV Risk Score and Plan:   Airway Management Planned: Mask and Natural Airway  Additional Equipment:   Intra-op Plan:   Post-operative Plan:   Informed Consent: I have reviewed the patients History and Physical, chart, labs and discussed the procedure including the risks, benefits and alternatives for the proposed anesthesia with the patient or authorized representative who has indicated his/her understanding and acceptance.     Dental advisory given  Plan Discussed with: CRNA  Anesthesia Plan Comments:        Anesthesia Quick Evaluation                                   Anesthesia Evaluation  Patient identified by MRN, date of birth, ID band Patient awake    Reviewed: Allergy & Precautions, H&P , NPO status , Patient's Chart, lab work & pertinent test results  History of Anesthesia Complications (+) DIFFICULT AIRWAY  Airway Mallampati: II  TM Distance: >3 FB Neck ROM: Full    Dental no notable dental hx. (+) Teeth Intact, Dental Advisory Given   Pulmonary asthma , sleep apnea , former smoker,    Pulmonary exam normal breath sounds clear to auscultation       Cardiovascular hypertension, Pt. on medications + dysrhythmias Atrial Fibrillation  Rhythm:Regular Rate:Normal     Neuro/Psych Bipolar Disorder negative neurological ROS     GI/Hepatic negative GI ROS, Neg liver ROS,   Endo/Other  Morbid obesity  Renal/GU negative Renal ROS  negative genitourinary   Musculoskeletal   Abdominal   Peds  Hematology negative hematology ROS (+)   Anesthesia Other Findings   Reproductive/Obstetrics negative OB ROS                            Anesthesia Physical Anesthesia Plan  ASA: III  Anesthesia Plan: General and Regional   Post-op Pain Management: GA combined w/ Regional for post-op pain   Induction: Intravenous  Airway Management Planned: Oral ETT and Video Laryngoscope Planned  Additional Equipment:   Intra-op Plan:  Post-operative Plan: Extubation in OR  Informed Consent: I have reviewed the patients History and Physical, chart, labs and discussed the procedure including the risks, benefits and alternatives for the proposed anesthesia with the patient or authorized representative who has indicated his/her understanding and acceptance.   Dental advisory given  Plan Discussed with: CRNA  Anesthesia Plan Comments: (Pt had a block on 9/28 and has some persistent numbness in her toes. I discussed with her the importance of the block for pain control and that doing another block  today will not make her numbness better but also won't make it worse. It should all resolve over the next few days and any persistent numbness after 10 days would be rare. She was agreeable to proceeding with a block for her case today.)       Anesthesia Quick Evaluation

## 2019-12-06 ENCOUNTER — Ambulatory Visit (HOSPITAL_COMMUNITY): Payer: PPO | Admitting: Anesthesiology

## 2019-12-06 ENCOUNTER — Encounter (HOSPITAL_COMMUNITY)
Admission: RE | Disposition: A | Discharge status: Home / Self Care | Payer: Self-pay | Source: Home / Self Care | Attending: Cardiology

## 2019-12-06 ENCOUNTER — Encounter (HOSPITAL_COMMUNITY): Payer: Self-pay | Admitting: Cardiology

## 2019-12-06 ENCOUNTER — Other Ambulatory Visit: Payer: Self-pay

## 2019-12-06 ENCOUNTER — Ambulatory Visit (HOSPITAL_COMMUNITY)
Admission: RE | Admit: 2019-12-06 | Discharge: 2019-12-06 | Disposition: A | Discharge status: Home / Self Care | Payer: PPO | Attending: Cardiology | Admitting: Cardiology

## 2019-12-06 DIAGNOSIS — Z87891 Personal history of nicotine dependence: Secondary | ICD-10-CM | POA: Insufficient documentation

## 2019-12-06 DIAGNOSIS — Z6841 Body Mass Index (BMI) 40.0 and over, adult: Secondary | ICD-10-CM | POA: Insufficient documentation

## 2019-12-06 DIAGNOSIS — E669 Obesity, unspecified: Secondary | ICD-10-CM | POA: Insufficient documentation

## 2019-12-06 DIAGNOSIS — I484 Atypical atrial flutter: Secondary | ICD-10-CM | POA: Diagnosis not present

## 2019-12-06 DIAGNOSIS — I4819 Other persistent atrial fibrillation: Secondary | ICD-10-CM | POA: Insufficient documentation

## 2019-12-06 DIAGNOSIS — Z7901 Long term (current) use of anticoagulants: Secondary | ICD-10-CM | POA: Diagnosis not present

## 2019-12-06 DIAGNOSIS — Z79899 Other long term (current) drug therapy: Secondary | ICD-10-CM | POA: Insufficient documentation

## 2019-12-06 DIAGNOSIS — I110 Hypertensive heart disease with heart failure: Secondary | ICD-10-CM | POA: Insufficient documentation

## 2019-12-06 DIAGNOSIS — I5032 Chronic diastolic (congestive) heart failure: Secondary | ICD-10-CM | POA: Insufficient documentation

## 2019-12-06 SURGERY — CANCELLED PROCEDURE
Anesthesia: General

## 2019-12-06 NOTE — Interval H&P Note (Signed)
History and Physical Interval Note:  12/06/2019 7:30 AM  Mary Mcfarland  has presented today for surgery, with the diagnosis of AFIB.  The various methods of treatment have been discussed with the patient and family. After consideration of risks, benefits and other options for treatment, the patient has consented to  Procedure(s): CARDIOVERSION (N/A) as a surgical intervention.  The patient's history has been reviewed, patient examined, no change in status, stable for surgery.  I have reviewed the patient's chart and labs.  Questions were answered to the patient's satisfaction.     Kirk Ruths

## 2019-12-06 NOTE — Progress Notes (Signed)
Patient presented today for cardioversion.  Patient was found to be in sinus rhythm per EKG.  Dr Stanford Breed aware.  Case cancelled and patient dishcharged.

## 2019-12-06 NOTE — H&P (Signed)
ATRIAL FIB OFFICE VISIT  12/02/2019 [place] [facility]   Fenton, [place] R, PA Cardiology  Persistent atrial fibrillation [facility]) Dx  Referred by Lawerance Cruel, MD Reason for Visit  Additional Documentation  Vitals:  BP 120/60  Pulse 96  Ht 5\' 6"  (1.676 m)  Wt 126.3 kg  LMP 09/24/2013  BMI 44.93 kg/m  BSA 2.43 m    More Vitals  Flowsheets:  Anthropometrics,  NEWS,  MEWS Score,  Method of Visit    Encounter Info:  Billing Info,  History,  Allergies,  Detailed Report    All Notes  Addendum Note by Enid Derry, CMA at 12/02/2019 8:30 AM Author: Enid Derry, CMA Author Type: Certified Medical Assistant Filed: 12/02/2019 12:13 PM  Note Status: Signed Cosign: Cosign Not Required Date of Service: 12/02/2019 8:30 AM  Editor: Enid Derry, CMA (Certified Medical Assistant)                  Encounter addended by: Enid Derry, CMA on: 12/02/2019 12:13 PM  Actions taken: Order Reconciliation Section accessed, Order list changed    Progress Notes by Oliver Barre, PA at 12/02/2019 8:30 AM Author: Oliver Barre, PA Author Type: Physician Assistant Filed: 12/02/2019 9:58 AM  Note Status: Signed Cosign: Cosign Not Required Date of Service: 12/02/2019 8:30 AM  Editor: Oliver Barre, PA (Physician Assistant)                    Primary Care Physician: Lawerance Cruel, MD Primary Cardiologist: Dr Gwenlyn Found  Primary Electrophysiologist: Dr Rayann Heman Referring Physician: Dr [name] is a 64 y.o. female with a history of HTN, bipolar disorder, and persistent atrial fibrillation who presents for follow up in the [facility]. She was first diagnosed with atrial fibrillation in 2017 and then recurrence found in January of this year at PCP visit. She went in for evaluation of headaches, dizziness. She was placed on BB and Eliquis. She underwent attempted cardioversion in February which failed  to restore SR. Patient is on Eliquis for a CHADS2VASC score of 3. She was evaluated by Dr Rayann Heman on 08/09/19 who recommended dofetilide. Patient is s/p dofetilide loading 3/16-3/19/21. She did not require DCCV. Patient was seen at her PCP office on 11/21/19 and found to be bradycardic HR 44 with PACs. She was also noted to have a 3 second pause on rhythm strip. Her BB was stopped. Patient reports that her SOB has improved off of metoprolol.   On follow up today, patient reports that she is about the same since her last visit. She does have some SOB and edema. She denies bleeding issues on anticoagulation. She is tolerating the diltiazem without difficulty.   Today, she denies symptoms of palpitations, chest pain, orthopnea, PND, dizziness, presyncope, syncope, bleeding, or neurologic sequela. The patient is tolerating medications without difficulties and is otherwise without complaint today.    Atrial Fibrillation Risk Factors:  she does have symptoms or diagnosis of sleep apnea. Sleep study pending. she does not have a history of rheumatic fever. she does not have a history of alcohol use.   she has a BMI of Body mass index is 44.93 kg/m.Marland Kitchen Filed Weights   12/02/19 0840  Weight: 126.3 kg         Family History  Problem Relation Age of Onset  . Heart disease Mother   . CVA Mother   . Hypertension Mother   .  Cancer Mother   . Heart disease Father   . Hypertension Father   . Heart attack Father      Atrial Fibrillation Management history:  Previous antiarrhythmic drugs: dofetilide Previous cardioversions: 07/24/19 Previous ablations: none CHADS2VASC score: 2 Anticoagulation history: Eliquis       Past Medical History:  Diagnosis Date  . Asthma   . Bipolar disorder ([place])   . Difficult intubation    Narrow airway  . Hypertension   . Longstanding persistent atrial fibrillation ([place])   . Sleep apnea    sleep study was "inconclusive" per  patient  . Visit for monitoring Tikosyn therapy 08/27/2019   Past Surgical History:  Procedure Laterality Date  . bone spur surgery    . CARDIOVERSION N/A 07/24/2019   Procedure: CARDIOVERSION;  Surgeon: Buford Dresser, MD;  Location: [facility] ENDOSCOPY;  Service: Cardiovascular;  Laterality: N/A;  . COLONOSCOPY    . HEEL SPUR RESECTION Left 03/15/2016   Procedure: ACHILLIES TENDON REPAIR AND PUMP BUMP EXCISION;  Surgeon: Melrose Nakayama, MD;  Location: [facility];  Service: Orthopedics;  Laterality: Left;  . TONSILLECTOMY            Current Outpatient Medications  Medication Sig Dispense Refill  . albuterol (PROVENTIL HFA;VENTOLIN HFA) 108 (90 Base) MCG/ACT inhaler Inhale 2 puffs into the lungs every 6 (six) hours as needed for wheezing or shortness of breath.    Marland Kitchen apixaban (ELIQUIS) 5 MG TABS tablet Take 1 tablet (5 mg total) by mouth 2 (two) times daily. 180 tablet 1  . atorvastatin (LIPITOR) 40 MG tablet Take 1 tablet (40 mg total) by mouth daily. 90 tablet 3  . buPROPion (WELLBUTRIN SR) 150 MG 12 hr tablet Take 300 mg by mouth daily.    Marland Kitchen diltiazem (CARDIZEM CD) 120 MG 24 hr capsule Take 1 capsule (120 mg total) by mouth daily. 30 capsule 3  . dofetilide (TIKOSYN) 250 MCG capsule Take 1 capsule (250 mcg total) by mouth 2 (two) times daily. 60 capsule 3  . furosemide (LASIX) 40 MG tablet Take 1 tablet (40 mg total) by mouth daily. 90 tablet 3  . ibuprofen (ADVIL) 800 MG tablet Take 800 mg by mouth at bedtime as needed (for pain).     Marland Kitchen lisinopril (ZESTRIL) 20 MG tablet Take 20 mg by mouth daily.    . Naphazoline-Pheniramine (OPCON-A) 0.027-0.315 % SOLN Place 1-2 drops into both eyes 3 (three) times daily as needed (for allergies).     Marland Kitchen omeprazole (PRILOSEC) 20 MG capsule Take 20 mg by mouth daily before breakfast.      No current facility-administered medications for this encounter.         Allergies  Allergen Reactions  . Niacin Hives  . Horse-Derived  Products Swelling and Other (See Comments)    Eyes swell     Social History        Socioeconomic History  . Marital status: Single    Spouse name: Not on file  . Number of children: Not on file  . Years of education: Not on file  . Highest education level: Not on file  Occupational History  . Not on file  Tobacco Use  . Smoking status: Former Smoker    Packs/day: 1.00    Types: Cigarettes    Quit date: 02/13/1996    Years since quitting: 23.8  . Smokeless tobacco: Never Used  Vaping Use  . Vaping Use: Never used  Substance and Sexual Activity  . Alcohol use: Yes  Alcohol/week: 3.0 - 4.0 standard drinks    Types: 2 Shots of liquor, 1 - 2 Standard drinks or equivalent per week    Comment: occassionally  . Drug use: Never  . Sexual activity: Not on file  Other Topics Concern  . 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 Difficulty of Paying Living Expenses:   Food Insecurity:   . Worried About Charity fundraiser in the Last Year:   . Arboriculturist in the Last Year:   Transportation Needs:   . Film/video editor (Medical):   Marland Kitchen Lack of Transportation (Non-Medical):   Physical Activity:   . Days of Exercise per Week:   . Minutes of Exercise per Session:   Stress:   . Feeling of Stress :   Social Connections:   . Frequency of Communication with Friends and Family:   . Frequency of Social Gatherings with Friends and Family:   . Attends Religious Services:   . Active Member of Clubs or Organizations:   . Attends Archivist Meetings:   Marland Kitchen Marital Status:   Intimate Partner Violence:   . Fear of Current or Ex-Partner:   . Emotionally Abused:   Marland Kitchen Physically Abused:   . Sexually Abused:      ROS- All systems are reviewed and negative except as per the HPI above.  Physical Exam:      Vitals:   12/02/19 0840  BP: 120/60  Pulse: 96  Weight: 126.3 kg  Height: 5\' 6"  (1.676 m)    GEN- The patient is well appearing obese female, alert and oriented x 3 today.   HEENT-head normocephalic, atraumatic, sclera clear, conjunctiva pink, hearing intact, trachea midline. Lungs- Clear to ausculation bilaterally, normal work of breathing Heart- Regular rate and rhythm, no murmurs, rubs or gallops  GI- soft, NT, ND, + BS Extremities- no clubbing, cyanosis, or edema MS- no significant deformity or atrophy Skin- no rash or lesion Psych- euthymic mood, full affect Neuro- strength and sensation are intact      Wt Readings from Last 3 Encounters:  12/02/19 126.3 kg  11/25/19 123.8 kg  11/15/19 124.3 kg    EKG today demonstrates atypical atrial flutter HR 96, RBBB, QRS 126, QTc 525  Echo 07/01/19 demonstrated  1. Left ventricular ejection fraction, by visual estimation, is 60 to  65%. The left ventricle has normal function. There is mildly increased  left ventricular hypertrophy.  2. Left ventricular diastolic parameters are indeterminate.  3. The left ventricle has no regional wall motion abnormalities.  4. Global right ventricle has normal systolic function.The right  ventricular size is normal. No increase in right ventricular wall  thickness.  5. Left atrial size was moderately dilated.  6. Right atrial size was normal.  7. The mitral valve is normal in structure. Trivial mitral valve  regurgitation. No evidence of mitral stenosis.  8. The tricuspid valve is normal in structure.  9. The tricuspid valve is normal in structure. Tricuspid valve  regurgitation is not demonstrated.  10. The aortic valve is tricuspid. Aortic valve regurgitation is not  visualized. Mild to moderate aortic valve sclerosis/calcification without  any evidence of aortic stenosis.  11. Calcified non coronary cusp.  12. The pulmonic valve was normal in structure. Pulmonic valve   regurgitation is not visualized.  13.  The inferior vena cava is normal in size with greater than 50%  respiratory variability, suggesting right atrial pressure of 3 mmHg.   Epic records are reviewed at length today  CHA2DS2-VAScScore = 2 The patient's score is based upon: CHF History: No HTN History: Yes Age : < 65 Diabetes History: No Stroke History: No Vascular Disease History: No Gender: Female    ASSESSMENT AND PLAN: 1. Persistent Atrial Fibrillation/atypical atrial flutter The patient's CHA2DS2-VASc score is 2, indicating a 2.2% annual risk of stroke.   S/p dofetilide loading 3/16-3/19/21. Patient remains in atypical atrial flutter. Will arrange for DCCV. Check bmet/CBC Continue diltiazem 120 mg daily Continue dofetilide 250 mcg BID Continue Eliquis 5 mg BID  2. Obesity Body mass index is 44.93 kg/m. Lifestyle modification was discussed and encouraged including regular physical activity and weight reduction.  3. Suspected OSA Sleep study scheduled for 01/2020.  4. HTN Stable, no changes today.  5. Chronic diastolic CHF Patient's weight is up since last week. Likely related to #1. Patient to increase lasix to 80 mg daily x3 days. Bmet as above.   Follow up with the AF clinic one week post DCCV.   [facility] [place], [place] [address] [phone] 12/02/2019 9:54 AM     For DCCV; compliant with apixaban; no changes Kirk Ruths

## 2019-12-06 NOTE — Procedures (Signed)
Electrical Cardioversion Procedure Note Mary Mcfarland [id number] Mar 01, 1956  Procedure: Electrical Cardioversion Indications:  Atrial Fibrillation  Pt in sinus at time of arrival; continue present meds; procedure canceled. Kirk Ruths

## 2019-12-12 NOTE — Progress Notes (Signed)
Primary Care Physician: Lawerance Cruel, MD Primary Cardiologist: Dr Gwenlyn Found  Primary Electrophysiologist: Dr Rayann Heman Referring Physician: Dr [name] is a 64 y.o. female with a history of HTN, bipolar disorder, and persistent atrial fibrillation who presents for follow up in the [facility]. She was first diagnosed with atrial fibrillation in 2017 and then recurrence found in January of this year at PCP visit. She went in for evaluation of headaches, dizziness.  She was placed on BB and Eliquis.  She underwent attempted cardioversion in February which failed to restore SR. Patient is on Eliquis for a CHADS2VASC score of 3. She was evaluated by Dr Rayann Heman on 08/09/19 who recommended dofetilide. Patient is s/p dofetilide loading 3/16-3/19/21. She did not require DCCV. Patient was seen at her PCP office on 11/21/19 and found to be bradycardic HR 44 with PACs. She was also noted to have a 3 second pause on rhythm strip. Her BB was stopped. Patient reports that her SOB has improved off of metoprolol.   On follow up today, patient presented for DCCV on 12/06/19 and was found to be in SR, procedure was cancelled. Her heart rates have been within the normal range. She denies bleeding on anticoagulation.   Today, she denies symptoms of palpitations, chest pain, orthopnea, PND, dizziness, presyncope, syncope, bleeding, or neurologic sequela. The patient is tolerating medications without difficulties and is otherwise without complaint today.    Atrial Fibrillation Risk Factors:  she does have symptoms or diagnosis of sleep apnea. Sleep study pending. she does not have a history of rheumatic fever. she does not have a history of alcohol use.   she has a BMI of Body mass index is 44.68 kg/m.Marland Kitchen Filed Weights   12/13/19 0837  Weight: 125.6 kg    Family History  Problem Relation Age of Onset   Heart disease Mother    CVA Mother    Hypertension  Mother    Cancer Mother    Heart disease Father    Hypertension Father    Heart attack Father      Atrial Fibrillation Management history:  Previous antiarrhythmic drugs: dofetilide Previous cardioversions: 07/24/19 Previous ablations: none CHADS2VASC score: 2 Anticoagulation history: Eliquis   Past Medical History:  Diagnosis Date   Asthma    Bipolar disorder ([place])    Difficult intubation    Narrow airway   Hypertension    Longstanding persistent atrial fibrillation (HCC)    Sleep apnea    sleep study was "inconclusive" per patient   Visit for monitoring Tikosyn therapy 08/27/2019   Past Surgical History:  Procedure Laterality Date   bone spur surgery     CARDIOVERSION N/A 07/24/2019   Procedure: CARDIOVERSION;  Surgeon: Buford Dresser, MD;  Location: [facility];  Service: Cardiovascular;  Laterality: N/A;   COLONOSCOPY     HEEL SPUR RESECTION Left 03/15/2016   Procedure: ACHILLIES TENDON REPAIR AND PUMP BUMP EXCISION;  Surgeon: Melrose Nakayama, MD;  Location: [facility];  Service: Orthopedics;  Laterality: Left;   TONSILLECTOMY      Current Outpatient Medications  Medication Sig Dispense Refill   albuterol (PROVENTIL HFA;VENTOLIN HFA) 108 (90 Base) MCG/ACT inhaler Inhale 2 puffs into the lungs every 6 (six) hours as needed for wheezing or shortness of breath.     apixaban (ELIQUIS) 5 MG TABS tablet Take 1 tablet (5 mg total) by mouth 2 (two) times daily. 60 tablet 5   atorvastatin (LIPITOR) 40 MG tablet Take  1 tablet (40 mg total) by mouth daily. 90 tablet 3   buPROPion (WELLBUTRIN SR) 150 MG 12 hr tablet Take 300 mg by mouth daily.     diltiazem (CARDIZEM CD) 120 MG 24 hr capsule Take 1 capsule (120 mg total) by mouth daily. 30 capsule 3   dofetilide (TIKOSYN) 250 MCG capsule Take 1 capsule (250 mcg total) by mouth 2 (two) times daily. 60 capsule 6   furosemide (LASIX) 40 MG tablet Take 1 tablet (40 mg total) by mouth daily. 90 tablet 3    ibuprofen (ADVIL) 800 MG tablet Take 800 mg by mouth as needed (for pain).      lisinopril (ZESTRIL) 20 MG tablet Take 20 mg by mouth daily.     Naphazoline-Pheniramine (OPCON-A) 0.027-0.315 % SOLN Place 1-2 drops into both eyes 3 (three) times daily as needed (for allergies).      omeprazole (PRILOSEC) 20 MG capsule Take 20 mg by mouth daily before breakfast.      potassium chloride (KLOR-CON M10) 10 MEQ tablet Take 1 tablet (10 mEq total) by mouth daily.     No current facility-administered medications for this encounter.    Allergies  Allergen Reactions   Niacin Hives   Horse-Derived Products Swelling and Other (See Comments)    Eyes swell     Social History   Socioeconomic History   Marital status: Single    Spouse name: Not on file   Number of children: Not on file   Years of education: Not on file   Highest education level: Not on file  Occupational History   Not on file  Tobacco Use   Smoking status: Former Smoker    Packs/day: 1.00    Types: Cigarettes    Quit date: 02/13/1996    Years since quitting: 23.8   Smokeless tobacco: Never Used  Vaping Use   Vaping Use: Never used  Substance and Sexual Activity   Alcohol use: Yes    Alcohol/week: 3.0 - 4.0 standard drinks    Types: 2 Shots of liquor, 1 - 2 Standard drinks or equivalent per week    Comment: occassionally   Drug use: Never   Sexual activity: Not on file  Other Topics Concern   Not on file  Social History Narrative   Lives in [place] alone   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)   Social Determinants of Health   Financial Resource Strain:    Difficulty of Paying Living Expenses:   Food Insecurity:    Worried About Charity fundraiser in the Last Year:    Arboriculturist in the Last Year:   Transportation Needs:    Film/video editor (Medical):    Lack of Transportation (Non-Medical):   Physical Activity:    Days of Exercise per Week:    Minutes of Exercise per  Session:   Stress:    Feeling of Stress :   Social Connections:    Frequency of Communication with Friends and Family:    Frequency of Social Gatherings with Friends and Family:    Attends Religious Services:    Active Member of Clubs or Organizations:    Attends Music therapist:    Marital Status:   Intimate Partner Violence:    Fear of Current or Ex-Partner:    Emotionally Abused:    Physically Abused:    Sexually Abused:      ROS- All systems are reviewed and negative except as per the HPI above.  Physical Exam: Vitals:   12/13/19 0837  BP: 126/74  Pulse: 76  Weight: 125.6 kg  Height: 5\' 6"  (1.676 m)    GEN- The patient is well appearing obese female, alert and oriented x 3 today.   HEENT-head normocephalic, atraumatic, sclera clear, conjunctiva pink, hearing intact, trachea midline. Lungs- Clear to ausculation bilaterally, normal work of breathing Heart- Regular rate and rhythm, no murmurs, rubs or gallops  GI- soft, NT, ND, + BS Extremities- no clubbing, cyanosis, or edema MS- no significant deformity or atrophy Skin- no rash or lesion Psych- euthymic mood, full affect Neuro- strength and sensation are intact   Wt Readings from Last 3 Encounters:  12/13/19 125.6 kg  12/02/19 126.3 kg  11/25/19 123.8 kg    EKG today demonstrates SR HR 76, 1st degree AV block, RBBB, PR 222, QRS 138, QTc 474  Echo 07/01/19 demonstrated  1. Left ventricular ejection fraction, by visual estimation, is 60 to  65%. The left ventricle has normal function. There is mildly increased  left ventricular hypertrophy.  2. Left ventricular diastolic parameters are indeterminate.  3. The left ventricle has no regional wall motion abnormalities.  4. Global right ventricle has normal systolic function.The right  ventricular size is normal. No increase in right ventricular wall  thickness.  5. Left atrial size was moderately dilated.  6. Right atrial size was  normal.  7. The mitral valve is normal in structure. Trivial mitral valve  regurgitation. No evidence of mitral stenosis.  8. The tricuspid valve is normal in structure.  9. The tricuspid valve is normal in structure. Tricuspid valve  regurgitation is not demonstrated.  10. The aortic valve is tricuspid. Aortic valve regurgitation is not  visualized. Mild to moderate aortic valve sclerosis/calcification without  any evidence of aortic stenosis.  11. Calcified non coronary cusp.  12. The pulmonic valve was normal in structure. Pulmonic valve  regurgitation is not visualized.  13. The inferior vena cava is normal in size with greater than 50%  respiratory variability, suggesting right atrial pressure of 3 mmHg.   Epic records are reviewed at length today  CHA2DS2-VASc Score = 2 The patient's score is based upon: CHF History: No HTN History: Yes Age : < 65 Diabetes History: No Stroke History: No Vascular Disease History: No Gender: Female    ASSESSMENT AND PLAN: 1. Persistent Atrial Fibrillation/atypical atrial flutter The patient's CHA2DS2-VASc score is 2, indicating a 2.2% annual risk of stroke.   S/p dofetilide loading 3/16-3/19/21. Patient spontaneously converted to SR, DCCV cancelled.  Continue diltiazem 120 mg daily Continue dofetilide 250 mcg BID. QT stable. Continue Eliquis 5 mg BID  2. Obesity Body mass index is 44.68 kg/m. Lifestyle modification was discussed and encouraged including regular physical activity and weight reduction.  3. Suspected OSA Sleep study scheduled for 01/2020.  4. HTN Stable, no changes today.  5. Chronic diastolic CHF Weight stable. No signs or symptoms of fluid overload today.   Follow up in the AF clinic as scheduled.    [facility] [address] [place], [place] [address] [phone] 12/13/2019 9:03 AM

## 2019-12-13 ENCOUNTER — Other Ambulatory Visit: Payer: Self-pay

## 2019-12-13 ENCOUNTER — Encounter (HOSPITAL_COMMUNITY): Payer: Self-pay | Admitting: Physician Assistant

## 2019-12-13 ENCOUNTER — Ambulatory Visit (HOSPITAL_COMMUNITY)
Admission: RE | Admit: 2019-12-13 | Discharge: 2019-12-13 | Disposition: A | Discharge status: Home / Self Care | Payer: PPO | Source: Ambulatory Visit | Attending: Physician Assistant | Admitting: Physician Assistant

## 2019-12-13 VITALS — BP 126/74 | HR 76 | Ht 66.0 in | Wt 276.8 lb

## 2019-12-13 DIAGNOSIS — I484 Atypical atrial flutter: Secondary | ICD-10-CM | POA: Insufficient documentation

## 2019-12-13 DIAGNOSIS — F319 Bipolar disorder, unspecified: Secondary | ICD-10-CM | POA: Insufficient documentation

## 2019-12-13 DIAGNOSIS — Z79899 Other long term (current) drug therapy: Secondary | ICD-10-CM | POA: Insufficient documentation

## 2019-12-13 DIAGNOSIS — J45909 Unspecified asthma, uncomplicated: Secondary | ICD-10-CM | POA: Diagnosis not present

## 2019-12-13 DIAGNOSIS — I4811 Longstanding persistent atrial fibrillation: Secondary | ICD-10-CM | POA: Insufficient documentation

## 2019-12-13 DIAGNOSIS — I440 Atrioventricular block, first degree: Secondary | ICD-10-CM | POA: Insufficient documentation

## 2019-12-13 DIAGNOSIS — Z6841 Body Mass Index (BMI) 40.0 and over, adult: Secondary | ICD-10-CM | POA: Diagnosis not present

## 2019-12-13 DIAGNOSIS — R7309 Other abnormal glucose: Secondary | ICD-10-CM | POA: Diagnosis not present

## 2019-12-13 DIAGNOSIS — Z888 Allergy status to other drugs, medicaments and biological substances status: Secondary | ICD-10-CM | POA: Diagnosis not present

## 2019-12-13 DIAGNOSIS — Z791 Long term (current) use of non-steroidal anti-inflammatories (NSAID): Secondary | ICD-10-CM | POA: Diagnosis not present

## 2019-12-13 DIAGNOSIS — R0609 Other forms of dyspnea: Secondary | ICD-10-CM | POA: Diagnosis not present

## 2019-12-13 DIAGNOSIS — I110 Hypertensive heart disease with heart failure: Secondary | ICD-10-CM | POA: Diagnosis not present

## 2019-12-13 DIAGNOSIS — Z87891 Personal history of nicotine dependence: Secondary | ICD-10-CM | POA: Diagnosis not present

## 2019-12-13 DIAGNOSIS — I4819 Other persistent atrial fibrillation: Secondary | ICD-10-CM | POA: Diagnosis not present

## 2019-12-13 DIAGNOSIS — I5032 Chronic diastolic (congestive) heart failure: Secondary | ICD-10-CM | POA: Insufficient documentation

## 2019-12-13 DIAGNOSIS — E669 Obesity, unspecified: Secondary | ICD-10-CM | POA: Insufficient documentation

## 2019-12-13 DIAGNOSIS — I4510 Unspecified right bundle-branch block: Secondary | ICD-10-CM | POA: Diagnosis not present

## 2019-12-13 DIAGNOSIS — Z8249 Family history of ischemic heart disease and other diseases of the circulatory system: Secondary | ICD-10-CM | POA: Insufficient documentation

## 2019-12-13 DIAGNOSIS — Z7901 Long term (current) use of anticoagulants: Secondary | ICD-10-CM | POA: Diagnosis not present

## 2019-12-17 ENCOUNTER — Telehealth (HOSPITAL_COMMUNITY): Payer: Self-pay | Admitting: *Deleted

## 2019-12-17 NOTE — Telephone Encounter (Signed)
Pt returning call regarding upcoming cardiac imaging study; pt verbalizes understanding of appt date/time, parking situation and where to check in, pre-test NPO status and medications ordered, and verified current allergies; name and call back number provided for further questions should they arise  Zein Helbing Tai RN Navigator Cardiac Imaging [facility] [phone] office [phone] cell  

## 2019-12-17 NOTE — Telephone Encounter (Signed)
Attempted to call patient regarding upcoming cardiac CT appointment. °Left message on voicemail with name and callback number ° °Avraj Lindroth Tai RN Navigator Cardiac Imaging °[facility] °[phone] Office °[phone] Cell ° °

## 2019-12-18 ENCOUNTER — Telehealth: Payer: Self-pay | Admitting: Cardiovascular Disease

## 2019-12-18 ENCOUNTER — Ambulatory Visit (HOSPITAL_COMMUNITY)
Admission: RE | Admit: 2019-12-18 | Discharge: 2019-12-18 | Disposition: A | Discharge status: Home / Self Care | Payer: PPO | Source: Ambulatory Visit | Attending: Cardiovascular Disease | Admitting: Cardiovascular Disease

## 2019-12-18 ENCOUNTER — Encounter (HOSPITAL_COMMUNITY): Payer: Self-pay

## 2019-12-18 ENCOUNTER — Other Ambulatory Visit: Payer: Self-pay

## 2019-12-18 MED ORDER — NITROGLYCERIN 0.4 MG SL SUBL: 0.8000 mg | SUBLINGUAL_TABLET | Freq: Once | SUBLINGUAL | Status: DC

## 2019-12-18 NOTE — Progress Notes (Signed)
[facility] for [facility] coronary. Upon hooking patient up to monitor for basic vital signs including BP and HR, it was noted that heart rate initially was in the 80s and BP was 957 systolic. She was sinus rhythm on the monitor with marked PAC's and frequent pauses. Patient was not symptomatic at this time however she states" she has not been feeling well lately and has been short of breath". Paged Dr. Margaretann Loveless about heart rate and rhythm and she mentioned potentially scanning the patient retrospective. This RN called CT tech and had them come look at  the rhythm to see if this scan would be possible. CT tech mentions the irregularity would make for a difficult scan. Dr. Margaretann Loveless said to reschedule the patient at this time. Patient is understanding at this time of the inability to perform the test and instructed to reach out to Dr. Kennon Holter office. This RN notified [facility] heart navigator of reasons for cancellation and she would also reach out to Dr. Gwenlyn Found. Patient was wheeled out to main entrance via wheelchair.

## 2019-12-18 NOTE — Telephone Encounter (Signed)
    Pt said she have a CT scan today, when she was there they didn't run the test because her HR is too irregular and a lot of pauses. She was told to call Dr. Gwenlyn Found about it

## 2019-12-18 NOTE — Telephone Encounter (Signed)
Pt reports that she was supposed to get a chest CT today because she has been having SOB & fatigue. She states that her BP was up and down & her HR was all over the place. They could not complete the scan due to her current rhythm being irregular as well as several long pauses. The nurse who was assisting with the CT recommended that the patient go to the ED to be evaluated and follow up with Dr. Gwenlyn Found. The patient does not want to go to the ED at this time as she does not feel that it is necessary and prefers to wait for Dr. Kennon Holter recommendation first. I advised the patient that if she began to experience any SOB, lightheaded/dizzy, CP, N/V or diaphoresis to call 911. She agreed.

## 2019-12-19 NOTE — Telephone Encounter (Signed)
Patient following up. She is aware that Dr. Gwenlyn Found is not in the office today.

## 2019-12-19 NOTE — Telephone Encounter (Signed)
Thanks for letting me know!

## 2019-12-19 NOTE — Telephone Encounter (Signed)
Called and spoke with patient regarding the my chart message she sent to the office today. She is very frustrated and upset because she feels nothing is being done to help her. She understands that dr berry is in the hospital today doing procedures and is not able to always able to look at his messages. She is having SOB that started around the time she started the tikosyn was started. She reports occ swelling but nothing today. She does not really want to talk to me as she feels I can not help her. She has made an appointment in the atrial fib clinic for tomorrow to discuss issues.

## 2019-12-19 NOTE — Telephone Encounter (Signed)
Mary Mcfarland called very frustrated she has not heard from the nurse or Dr. Gwenlyn Found since her call yesterday. I advised the patient Dr. Gwenlyn Found is not in office today, but rounding at the hospital and would be back in tomorrow. The patient got very upset by this and stated at this point she feels she needs to find another Doctor because she feels no one is concerned with her issues besides her. I advised the patient I could send an additional message in regards to it, but the patient angrily hung up.

## 2019-12-20 ENCOUNTER — Ambulatory Visit (HOSPITAL_COMMUNITY)
Admission: RE | Admit: 2019-12-20 | Discharge: 2019-12-20 | Disposition: A | Discharge status: Home / Self Care | Payer: PPO | Source: Ambulatory Visit | Attending: Physician Assistant | Admitting: Physician Assistant

## 2019-12-20 ENCOUNTER — Telehealth: Payer: Self-pay | Admitting: Cardiology

## 2019-12-20 ENCOUNTER — Other Ambulatory Visit: Payer: Self-pay

## 2019-12-20 VITALS — BP 130/72 | HR 86

## 2019-12-20 DIAGNOSIS — G4730 Sleep apnea, unspecified: Secondary | ICD-10-CM | POA: Diagnosis not present

## 2019-12-20 DIAGNOSIS — Z8249 Family history of ischemic heart disease and other diseases of the circulatory system: Secondary | ICD-10-CM | POA: Insufficient documentation

## 2019-12-20 DIAGNOSIS — I4510 Unspecified right bundle-branch block: Secondary | ICD-10-CM | POA: Insufficient documentation

## 2019-12-20 DIAGNOSIS — Z79899 Other long term (current) drug therapy: Secondary | ICD-10-CM | POA: Diagnosis not present

## 2019-12-20 DIAGNOSIS — I4811 Longstanding persistent atrial fibrillation: Secondary | ICD-10-CM | POA: Insufficient documentation

## 2019-12-20 DIAGNOSIS — E669 Obesity, unspecified: Secondary | ICD-10-CM | POA: Insufficient documentation

## 2019-12-20 DIAGNOSIS — F319 Bipolar disorder, unspecified: Secondary | ICD-10-CM | POA: Diagnosis not present

## 2019-12-20 DIAGNOSIS — I4819 Other persistent atrial fibrillation: Secondary | ICD-10-CM

## 2019-12-20 DIAGNOSIS — J45909 Unspecified asthma, uncomplicated: Secondary | ICD-10-CM | POA: Insufficient documentation

## 2019-12-20 DIAGNOSIS — Z7901 Long term (current) use of anticoagulants: Secondary | ICD-10-CM | POA: Insufficient documentation

## 2019-12-20 DIAGNOSIS — Z87891 Personal history of nicotine dependence: Secondary | ICD-10-CM | POA: Diagnosis not present

## 2019-12-20 DIAGNOSIS — I440 Atrioventricular block, first degree: Secondary | ICD-10-CM | POA: Insufficient documentation

## 2019-12-20 DIAGNOSIS — I110 Hypertensive heart disease with heart failure: Secondary | ICD-10-CM | POA: Insufficient documentation

## 2019-12-20 DIAGNOSIS — I5032 Chronic diastolic (congestive) heart failure: Secondary | ICD-10-CM | POA: Insufficient documentation

## 2019-12-20 DIAGNOSIS — R0600 Dyspnea, unspecified: Secondary | ICD-10-CM | POA: Diagnosis not present

## 2019-12-20 DIAGNOSIS — Z886 Allergy status to analgesic agent status: Secondary | ICD-10-CM | POA: Insufficient documentation

## 2019-12-20 DIAGNOSIS — I491 Atrial premature depolarization: Secondary | ICD-10-CM | POA: Insufficient documentation

## 2019-12-20 NOTE — Progress Notes (Signed)
Primary Care Physician: Lawerance Cruel, MD Primary Cardiologist: Dr Gwenlyn Found  Primary Electrophysiologist: Dr Rayann Heman Referring Physician: Dr [name] is a 64 y.o. female with a history of HTN, bipolar disorder, and persistent atrial fibrillation who presents for follow up in the [facility]. She was first diagnosed with atrial fibrillation in 2017 and then recurrence found in January of this year at PCP visit. She went in for evaluation of headaches, dizziness.  She was placed on BB and Eliquis.  She underwent attempted cardioversion in February which failed to restore SR. Patient is on Eliquis for a CHADS2VASC score of 3. She was evaluated by Dr Rayann Heman on 08/09/19 who recommended dofetilide. Patient is s/p dofetilide loading 3/16-3/19/21. She did not require DCCV. Patient was seen at her PCP office on 11/21/19 and found to be bradycardic HR 44 with PACs. She was also noted to have a 3 second pause on rhythm strip. Her BB was stopped. Patient reports that her SOB has improved off of metoprolol. Patient presented for DCCV on 12/06/19 and was found to be in SR, procedure was cancelled.   On follow up today, patient continues to have SOB with exertion. She was scheduled for a cardiac CT but this was cancelled 2/2 frequent PACs and pauses on telemetry. She is in SR today. She is frustrated today that she could not have her procedure.   Today, she denies symptoms of palpitations, chest pain, orthopnea, PND, dizziness, presyncope, syncope, bleeding, or neurologic sequela. The patient is tolerating medications without difficulties and is otherwise without complaint today.    Atrial Fibrillation Risk Factors:  she does have symptoms or diagnosis of sleep apnea. Sleep study pending. she does not have a history of rheumatic fever. she does not have a history of alcohol use.   she has a BMI of There is no height or weight on file to calculate BMI.. There  were no vitals filed for this visit.  Family History  Problem Relation Age of Onset  . Heart disease Mother   . CVA Mother   . Hypertension Mother   . Cancer Mother   . Heart disease Father   . Hypertension Father   . Heart attack Father      Atrial Fibrillation Management history:  Previous antiarrhythmic drugs: dofetilide Previous cardioversions: 07/24/19 Previous ablations: none CHADS2VASC score: 2 Anticoagulation history: Eliquis   Past Medical History:  Diagnosis Date  . Asthma   . Bipolar disorder ([place])   . Difficult intubation    Narrow airway  . Hypertension   . Longstanding persistent atrial fibrillation ([place])   . Sleep apnea    sleep study was "inconclusive" per patient  . Visit for monitoring Tikosyn therapy 08/27/2019   Past Surgical History:  Procedure Laterality Date  . bone spur surgery    . CARDIOVERSION N/A 07/24/2019   Procedure: CARDIOVERSION;  Surgeon: Buford Dresser, MD;  Location: [facility] ENDOSCOPY;  Service: Cardiovascular;  Laterality: N/A;  . COLONOSCOPY    . HEEL SPUR RESECTION Left 03/15/2016   Procedure: ACHILLIES TENDON REPAIR AND PUMP BUMP EXCISION;  Surgeon: Melrose Nakayama, MD;  Location: [facility];  Service: Orthopedics;  Laterality: Left;  . TONSILLECTOMY      Current Outpatient Medications  Medication Sig Dispense Refill  . albuterol (PROVENTIL HFA;VENTOLIN HFA) 108 (90 Base) MCG/ACT inhaler Inhale 2 puffs into the lungs every 6 (six) hours as needed for wheezing or shortness of breath.    Marland Kitchen apixaban (  ELIQUIS) 5 MG TABS tablet Take 1 tablet (5 mg total) by mouth 2 (two) times daily. 60 tablet 5  . atorvastatin (LIPITOR) 40 MG tablet Take 1 tablet (40 mg total) by mouth daily. 90 tablet 3  . buPROPion (WELLBUTRIN SR) 150 MG 12 hr tablet Take 300 mg by mouth daily.    Marland Kitchen diltiazem (CARDIZEM CD) 120 MG 24 hr capsule Take 1 capsule (120 mg total) by mouth daily. 30 capsule 3  . dofetilide (TIKOSYN) 250 MCG capsule Take 1 capsule (250 mcg  total) by mouth 2 (two) times daily. 60 capsule 6  . furosemide (LASIX) 40 MG tablet Take 1 tablet (40 mg total) by mouth daily. 90 tablet 3  . ibuprofen (ADVIL) 800 MG tablet Take 800 mg by mouth as needed (for pain).     Marland Kitchen lisinopril (ZESTRIL) 20 MG tablet Take 20 mg by mouth daily.    . Naphazoline-Pheniramine (OPCON-A) 0.027-0.315 % SOLN Place 1-2 drops into both eyes 3 (three) times daily as needed (for allergies).     Marland Kitchen omeprazole (PRILOSEC) 20 MG capsule Take 20 mg by mouth daily before breakfast.     . potassium chloride (KLOR-CON M10) 10 MEQ tablet Take 1 tablet (10 mEq total) by mouth daily.     No current facility-administered medications for this encounter.    Allergies  Allergen Reactions  . Niacin Hives  . Horse-Derived Products Swelling and Other (See Comments)    Eyes swell     Social History   Socioeconomic History  . Marital status: Single    Spouse name: Not on file  . Number of children: Not on file  . Years of education: Not on file  . Highest education level: Not on file  Occupational History  . Not on file  Tobacco Use  . Smoking status: Former Smoker    Packs/day: 1.00    Types: Cigarettes    Quit date: 02/13/1996    Years since quitting: 23.8  . Smokeless tobacco: Never Used  Vaping Use  . Vaping Use: Never used  Substance and Sexual Activity  . Alcohol use: Yes    Alcohol/week: 3.0 - 4.0 standard drinks    Types: 2 Shots of liquor, 1 - 2 Standard drinks or equivalent per week    Comment: occassionally  . Drug use: Never  . Sexual activity: Not on file  Other Topics Concern  . 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 Difficulty of Paying Living Expenses:   Food Insecurity:   . Worried About Charity fundraiser in the Last Year:   . Arboriculturist in the Last Year:   Transportation Needs:   . Lexicographer (Medical):   Marland Kitchen Lack of Transportation (Non-Medical):   Physical Activity:   . Days of Exercise per Week:   . Minutes of Exercise per Session:   Stress:   . Feeling of Stress :   Social Connections:   . Frequency of Communication with Friends and Family:   . Frequency of Social Gatherings with Friends and Family:   . Attends Religious Services:   . Active Member of Clubs or Organizations:   . Attends Archivist Meetings:   Marland Kitchen Marital Status:   Intimate Partner Violence:   . Fear of Current or Ex-Partner:   . Emotionally Abused:   .  Physically Abused:   . Sexually Abused:      ROS- All systems are reviewed and negative except as per the HPI above.  Physical Exam: Vitals:   12/20/19 1019  BP: 130/72  Pulse: 86    GEN- The patient is well appearing obese female, alert and oriented x 3 today.   HEENT-head normocephalic, atraumatic, sclera clear, conjunctiva pink, hearing intact, trachea midline. Lungs- Clear to ausculation bilaterally, normal work of breathing Heart- Regular rate and rhythm, no murmurs, rubs or gallops  GI- soft, NT, ND, + BS Extremities- no clubbing, cyanosis, or edema MS- no significant deformity or atrophy Skin- no rash or lesion Psych- euthymic mood, full affect Neuro- strength and sensation are intact   Wt Readings from Last 3 Encounters:  12/13/19 125.6 kg  12/02/19 126.3 kg  11/25/19 123.8 kg    EKG today demonstrates SR HR 86, 1st degree AV block, RBBB, PR 214, QRS 142, QTc 490  Echo 07/01/19 demonstrated  1. Left ventricular ejection fraction, by visual estimation, is 60 to  65%. The left ventricle has normal function. There is mildly increased  left ventricular hypertrophy.  2. Left ventricular diastolic parameters are indeterminate.  3. The left ventricle has no regional wall motion abnormalities.  4. Global right ventricle has normal systolic function.The right  ventricular size is normal. No increase in right  ventricular wall  thickness.  5. Left atrial size was moderately dilated.  6. Right atrial size was normal.  7. The mitral valve is normal in structure. Trivial mitral valve  regurgitation. No evidence of mitral stenosis.  8. The tricuspid valve is normal in structure.  9. The tricuspid valve is normal in structure. Tricuspid valve  regurgitation is not demonstrated.  10. The aortic valve is tricuspid. Aortic valve regurgitation is not  visualized. Mild to moderate aortic valve sclerosis/calcification without  any evidence of aortic stenosis.  11. Calcified non coronary cusp.  12. The pulmonic valve was normal in structure. Pulmonic valve  regurgitation is not visualized.  13. The inferior vena cava is normal in size with greater than 50%  respiratory variability, suggesting right atrial pressure of 3 mmHg.   Epic records are reviewed at length today  CHA2DS2-VASc Score = 2 The patient's score is based upon: CHF History: No HTN History: Yes Age : < 65 Diabetes History: No Stroke History: No Vascular Disease History: No Gender: Female    ASSESSMENT AND PLAN: 1. Persistent Atrial Fibrillation/atypical atrial flutter The patient's CHA2DS2-VASc score is 2, indicating a 2.2% annual risk of stroke.   Patient appear to be maintaining SR. Continue diltiazem 120 mg daily Continue dofetilide 250 mcg BID. QT stable.  Continue Eliquis 5 mg BID  2. Obesity There is no height or weight on file to calculate BMI. Lifestyle modification was discussed and encouraged including regular physical activity and weight reduction.  3. Suspected OSA Sleep study scheduled for 01/2020.  4. HTN Stable, no changes today.  5. Chronic diastolic CHF No signs or symptoms of fluid overload.  6. PACs/Bradycardia Noted on telemetry when she presented for her CT. No ECG done at that time. She was asymptomatic.  ? Similar to episode at her PCP office. Will order a 14 day Zio patch to evaluate for  significant bradycardia or blocks.   7. Dyspnea on exertion Appears out of proportion to afib burden. Defer decision regarding ischemia workup to primary cardiologist.    Follow up in the AF clinic in 4-6 weeks.    Bear Stearns  [facility] PA-C Afib [facility] [address] [place],  [address] [phone] 12/20/2019 11:30 AM

## 2019-12-20 NOTE — Telephone Encounter (Signed)
New Message   Pt requesting to switch provider from Dr. Gwenlyn Found to Dr. Harrell Gave  Please advise

## 2019-12-21 NOTE — Telephone Encounter (Signed)
That's fine with me.  JJB

## 2019-12-23 NOTE — Telephone Encounter (Signed)
OK with me.

## 2019-12-30 ENCOUNTER — Telehealth (INDEPENDENT_AMBULATORY_CARE_PROVIDER_SITE_OTHER): Payer: PPO | Admitting: Cardiology

## 2019-12-30 ENCOUNTER — Encounter: Payer: Self-pay | Admitting: Cardiology

## 2019-12-30 ENCOUNTER — Encounter (INDEPENDENT_AMBULATORY_CARE_PROVIDER_SITE_OTHER): Payer: Self-pay

## 2019-12-30 VITALS — BP 146/81 | HR 74 | Ht 66.0 in | Wt 277.0 lb

## 2019-12-30 DIAGNOSIS — R0609 Other forms of dyspnea: Secondary | ICD-10-CM | POA: Insufficient documentation

## 2019-12-30 DIAGNOSIS — I10 Essential (primary) hypertension: Secondary | ICD-10-CM

## 2019-12-30 DIAGNOSIS — I480 Paroxysmal atrial fibrillation: Secondary | ICD-10-CM

## 2019-12-30 DIAGNOSIS — R0600 Dyspnea, unspecified: Secondary | ICD-10-CM | POA: Diagnosis not present

## 2019-12-30 DIAGNOSIS — E782 Mixed hyperlipidemia: Secondary | ICD-10-CM

## 2019-12-30 NOTE — Telephone Encounter (Signed)
This one is for you 

## 2019-12-30 NOTE — Progress Notes (Signed)
Virtual Visit via Video Note   This visit type was conducted due to national recommendations for restrictions regarding the COVID-19 Pandemic (e.g. social distancing) in an effort to limit this patient's exposure and mitigate transmission in our community.  Due to her co-morbid illnesses, this patient is at least at moderate risk for complications without adequate follow up.  This format is felt to be most appropriate for this patient at this time.  All issues noted in this document were discussed and addressed.  A limited physical exam was performed with this format.  Please refer to the patient's chart for her consent to telehealth for [facility].   The patient was identified using 2 identifiers.  Date:  12/30/2019   ID:  TERIN Mcfarland, DOB 1956-03-02, MRN [id number]  Patient Location: Home Provider Location: Home Office  PCP:  Lawerance Cruel, MD  Cardiologist:  Buford Dresser, MD  Electrophysiologist:  Thompson Grayer, MD   Evaluation Performed:  Follow-Up Visit  Chief Complaint:  New patient to me/general cardiology follow up  History of Present Illness:    Mary Mcfarland is a 64 y.o. female with history of atrial fibrillation, hypertension, hyperlipidemia, obesity, obstructive sleep apnea seen for follow up today. This is my first visit with her, and she was previously seen by Dr. Gwenlyn Mcfarland.  The patient does not have symptoms concerning for COVID-19 infection (fever, chills, cough, or new shortness of breath).   Today: Many patient concerns today: -has concerns about her heart since being treated for afib in 06/2019. Despite management of atrial fibrillation, remains very short of breath with no energy. Couldn't do CT coronary 12/18/19 due to irregular heart rates. Had a great experience with nurse Marylyn Ishihara in imaging, wants to send a thank you. -Wearing Zio now, for another 5 days, to see if there is a rhythm issue. Can't be there to spend time/help care for  grandchildren. Not working at [employer] any more, on [employer].  -anxious, worried. She is bipolar, having trouble sleeping, looking for a new psychiatrist. Has been very emotional. This is also affecting her life. -worried as they saw pauses on telemetry, wondering if this is part of the issues.  -initially diagnosed by Dr. Wynonia Lawman in 2017 with brief afib, had 30 day monitor, didn't show any more. Had chemical nuclear stress at that time, felt miserable for a short time, but told it was nor  Reviewed tests to this point. Summarized below: -Echo 06/2019: EF 60-65%, indeterminate DF, no significant valve disease. LA diameter 4.5 cm -Ca score 06/2019: Ca score 22. Reviewed what this means -attempted CT coronary 12/18/19, highly variable heart rate, test cancelled.  Reviewed cardiac medications: Apixaban 5 mg BID Atorvastatin 40 mg daily Diltiazem 120 mg daily (thinks this may be constipation, discussed miralax) Dofetilide 250 mcg BID Furosemide 40 mg daily Lisinopril 20 mg daily  Cardiovascular risk factors: Prior clinical ASCVD: none that she has been told Comorbid conditions: hypertension, hyperlipidemia. Denies diabetes (though PCP states recent sugar was high, recent A1c per Dr Alan Ripper office was 6.1). Denies chronic kidney disease. Has been told she had inconclusive sleep study, has another one pending in about a month. Has not been prescribed CPAP previously. Metabolic syndrome/Obesity: BMI 44.7, has been told that she needs to lose weight. Lost 80 lbs in 2015 after separating from husband (peak weight >300 lbs), but has slowly gained. Has been referred to healthy weight and wellness, hasn't started yet. Chronic inflammatory conditions: none Tobacco use history: Quit in 1997.  Exercise level: lives on the second floor, feels short of breath, heart racing by the top of the stairs. Never was like this before, even when over 300 lbs. New with her afib diagnosis. Current diet: has been told to cut out  simple sugars to prevent progression to diabetes.  Denies chest pain, shortness of breath at rest. No PND, orthopnea. No syncope. Had HCTZ stopped with start of dofetilide, started on lasix, does have mild LE edema at the end of the day.  Past Medical History:  Diagnosis Date  . Asthma   . Bipolar disorder ([place])   . Difficult intubation    Narrow airway  . Hypertension   . Longstanding persistent atrial fibrillation ([place])   . Sleep apnea    sleep study was "inconclusive" per patient  . Visit for monitoring Tikosyn therapy 08/27/2019   Past Surgical History:  Procedure Laterality Date  . bone spur surgery    . CARDIOVERSION N/A 07/24/2019   Procedure: CARDIOVERSION;  Surgeon: Buford Dresser, MD;  [facility]: [facility] ENDOSCOPY;  Service: Cardiovascular;  Laterality: N/A;  . COLONOSCOPY    . HEEL SPUR RESECTION Left 03/15/2016   Procedure: ACHILLIES TENDON REPAIR AND PUMP BUMP EXCISION;  Surgeon: Melrose Nakayama, MD;  Location: [facility];  Service: Orthopedics;  Laterality: Left;  . TONSILLECTOMY       Current Meds  Medication Sig  . albuterol (PROVENTIL HFA;VENTOLIN HFA) 108 (90 Base) MCG/ACT inhaler Inhale 2 puffs into the lungs every 6 (six) hours as needed for wheezing or shortness of breath.  Marland Kitchen apixaban (ELIQUIS) 5 MG TABS tablet Take 1 tablet (5 mg total) by mouth 2 (two) times daily.  Marland Kitchen atorvastatin (LIPITOR) 40 MG tablet Take 1 tablet (40 mg total) by mouth daily.  Marland Kitchen buPROPion (WELLBUTRIN SR) 150 MG 12 hr tablet Take 300 mg by mouth daily.  Marland Kitchen diltiazem (CARDIZEM CD) 120 MG 24 hr capsule Take 1 capsule (120 mg total) by mouth daily.  Marland Kitchen dofetilide (TIKOSYN) 250 MCG capsule Take 1 capsule (250 mcg total) by mouth 2 (two) times daily.  . furosemide (LASIX) 40 MG tablet Take 1 tablet (40 mg total) by mouth daily.  Marland Kitchen ibuprofen (ADVIL) 800 MG tablet Take 800 mg by mouth as needed (for pain).   Marland Kitchen lisinopril (ZESTRIL) 20 MG tablet Take 20 mg by mouth daily.  . Naphazoline-Pheniramine  (OPCON-A) 0.027-0.315 % SOLN Place 1-2 drops into both eyes 3 (three) times daily as needed (for allergies).   Marland Kitchen omeprazole (PRILOSEC) 20 MG capsule Take 20 mg by mouth daily before breakfast.      Allergies:   Niacin and Horse-derived products   Social History   Tobacco Use  . Smoking status: Former Smoker    Packs/day: 1.00    Types: Cigarettes    Quit date: 02/13/1996    Years since quitting: 23.8  . Smokeless tobacco: Never Used  Vaping Use  . Vaping Use: Never used  Substance Use Topics  . Alcohol use: Yes    Alcohol/week: 3.0 - 4.0 standard drinks    Types: 2 Shots of liquor, 1 - 2 Standard drinks or equivalent per week    Comment: occassionally  . Drug use: Never     Family Hx: The patient's family history includes CVA in her mother; Cancer in her mother; Heart attack in her father; Heart disease in her father and mother; Hypertension in her father and mother.  ROS:   Please see the history of present illness.    All other  systems reviewed and are negative.   Prior CV studies:   The following studies were reviewed today: Echo 07/01/19 1. Left ventricular ejection fraction, by visual estimation, is 60 to  65%. The left ventricle has normal function. There is mildly increased  left ventricular hypertrophy.  2. Left ventricular diastolic parameters are indeterminate.  3. The left ventricle has no regional wall motion abnormalities.  4. Global right ventricle has normal systolic function.The right  ventricular size is normal. No increase in right ventricular wall  thickness.  5. Left atrial size was moderately dilated.  6. Right atrial size was normal.  7. The mitral valve is normal in structure. Trivial mitral valve  regurgitation. No evidence of mitral stenosis.  8. The tricuspid valve is normal in structure.  9. The tricuspid valve is normal in structure. Tricuspid valve  regurgitation is not demonstrated.  10. The aortic valve is tricuspid. Aortic valve  regurgitation is not  visualized. Mild to moderate aortic valve sclerosis/calcification without  any evidence of aortic stenosis.  11. Calcified non coronary cusp.  12. The pulmonic valve was normal in structure. Pulmonic valve  regurgitation is not visualized.  13. The inferior vena cava is normal in size with greater than 50%  respiratory variability, suggesting right atrial pressure of 3 mmHg.   Labs/Other Tests and Data Reviewed:    EKG:  An ECG dated 12/20/19 was personally reviewed today and demonstrated:  SR, 1st degree AV block, RBBB, HR 86 bpm  Recent Labs: 10/03/2019: B Natriuretic Peptide 208.7 11/13/2019: ALT 18; Magnesium 2.0; TSH 3.330 12/02/2019: BUN 18; Creatinine, Ser 0.93; Hemoglobin 12.4; Platelets 299; Potassium 3.8; Sodium 141   Recent Lipid Panel Lab Results  Component Value Date/Time   CHOL 175 11/13/2019 08:43 AM   TRIG 101 11/13/2019 08:43 AM   HDL 56 11/13/2019 08:43 AM   CHOLHDL 3.1 11/13/2019 08:43 AM   LDLCALC 101 (H) 11/13/2019 08:43 AM    Wt Readings from Last 3 Encounters:  12/30/19 277 lb (125.6 kg)  12/13/19 276 lb 12.8 oz (125.6 kg)  12/02/19 278 lb 6.4 oz (126.3 kg)     Objective:    Vital Signs:  BP (!) 146/81   Pulse 74   Ht 5\' 6"  (1.676 m)   Wt 277 lb (125.6 kg)   LMP 09/24/2013   BMI 44.71 kg/m   VITAL SIGNS:  reviewed GEN:  no acute distress EYES:  sclerae anicteric, EOMI - Extraocular Movements Intact RESPIRATORY:  normal respiratory effort, symmetric expansion CARDIOVASCULAR:  no visible JVD SKIN:  no rash, lesions or ulcers. MUSCULOSKELETAL:  no obvious deformities. NEURO:  alert and oriented x 3, no obvious focal deficit PSYCH:  normal affect  ASSESSMENT & PLAN:    Fatigue, dyspnea on exertion: -discussed options. After shared decision making, will await monitor results. If clear rhythm issues present, will address first. Otherwise will consider re-attempted at cardiac CT vs chemical nuclear stress.  Atrial fibrillation,  paroxysmal: -symptoms line up with persistent afib, now paroxysmal on dofetilide -CHA2DS2/VAS Stroke Risk Points= 2 -continue apixaban -based on results of monitor, may be able to alter/stop diltiazem (having some constipation) -check ECG at follow up visit  Hypertension: -elevated today, though reports good control -follow up with in office reading at next visit -continue lisinopril, furosemide, diltiazem  Morbid obesity: -we discussed that weight loss is a long term goal. Will first exclude high risk cardiovascular issues  Possible sleep apnea: -prior study indeterminate per patient, has repeat pending next month  Hyperlipidemia: -  continue atorvastatin  COVID-19 Education: The signs and symptoms of COVID-19 were discussed with the patient and how to seek care for testing (follow up with PCP or arrange E-visit).  The importance of social distancing was discussed today.  Time:   Today, I have spent 46 minutes with the patient with telehealth technology discussing the above problems.  Additional time spent for chart review and documentation.   Follow up 3-4 weeks, in person.  Medication Adjustments/Labs and Tests Ordered: Current medicines are reviewed at length with the patient today.  Concerns regarding medicines are outlined above.   Patient Instructions  Medication Instructions:  Your Physician recommend you continue on your current medication as directed.    *If you need a refill on your cardiac medications before your next appointment, please call your pharmacy*   Lab Work: None  Testing/Procedures: None   Follow-Up: At [facility], you and your health needs are our priority.  As part of our continuing mission to provide you with exceptional heart care, we have created designated Provider Care Teams.  These Care Teams include your primary Cardiologist (physician) and Advanced Practice Providers (APPs -  Physician Assistants and Nurse Practitioners) who all work  together to provide you with the care you need, when you need it.  We recommend signing up for the patient portal called "MyChart".  Sign up information is provided on this After Visit Summary.  MyChart is used to connect with patients for Virtual Visits (Telemedicine).  Patients are able to view lab/test results, encounter notes, upcoming appointments, etc.  Non-urgent messages can be sent to your provider as well.   To learn more about what you can do with MyChart, go to [email or web address].    Your next appointment:   4 week(s)  The format for your next appointment:   In Person  Provider:   Buford Dresser, MD      Signed, Buford Dresser, MD  12/30/2019 2:50 PM    [place]

## 2019-12-30 NOTE — Telephone Encounter (Signed)
Hi Merle, this patient had a wonderful experience with one of the radiology nurses. I hope you can pass the message along! Copied here just in case:  Hi Dr Harrell Gave,  When we spoke this morning I told you about my nurse Marylyn Ishihara I believe was his name from the radiology dept. Anyways if you could please pass on to him what a wonderful & thoughtful  person he is.  He truly touched my heart pardon the pun.  When he hugged me after my failed appointment it truly meant the world to me.  Ok Edwards, Kudos to him!!!  If only the world had more people like him it would be fabulous. Thank you so much, [name] Harwell-Lamb

## 2019-12-30 NOTE — Patient Instructions (Signed)
Medication Instructions:  Your Physician recommend you continue on your current medication as directed.    *If you need a refill on your cardiac medications before your next appointment, please call your pharmacy*   Lab Work: None  Testing/Procedures: None   Follow-Up: At [facility], you and your health needs are our priority.  As part of our continuing mission to provide you with exceptional heart care, we have created designated Provider Care Teams.  These Care Teams include your primary Cardiologist (physician) and Advanced Practice Providers (APPs -  Physician Assistants and Nurse Practitioners) who all work together to provide you with the care you need, when you need it.  We recommend signing up for the patient portal called "MyChart".  Sign up information is provided on this After Visit Summary.  MyChart is used to connect with patients for Virtual Visits (Telemedicine).  Patients are able to view lab/test results, encounter notes, upcoming appointments, etc.  Non-urgent messages can be sent to your provider as well.   To learn more about what you can do with MyChart, go to [email or web address].    Your next appointment:   4 week(s)  The format for your next appointment:   In Person  Provider:   Buford Dresser, MD

## 2020-01-14 ENCOUNTER — Ambulatory Visit (INDEPENDENT_AMBULATORY_CARE_PROVIDER_SITE_OTHER): Payer: PPO | Admitting: Family Medicine

## 2020-01-14 ENCOUNTER — Other Ambulatory Visit: Payer: Self-pay

## 2020-01-14 ENCOUNTER — Encounter (INDEPENDENT_AMBULATORY_CARE_PROVIDER_SITE_OTHER): Payer: Self-pay | Admitting: Family Medicine

## 2020-01-14 VITALS — BP 135/70 | HR 86 | Temp 98.3°F | Ht 65.0 in | Wt 278.0 lb

## 2020-01-14 DIAGNOSIS — I4891 Unspecified atrial fibrillation: Secondary | ICD-10-CM | POA: Diagnosis not present

## 2020-01-14 DIAGNOSIS — Z1331 Encounter for screening for depression: Secondary | ICD-10-CM

## 2020-01-14 DIAGNOSIS — F319 Bipolar disorder, unspecified: Secondary | ICD-10-CM

## 2020-01-14 DIAGNOSIS — R0602 Shortness of breath: Secondary | ICD-10-CM

## 2020-01-14 DIAGNOSIS — E559 Vitamin D deficiency, unspecified: Secondary | ICD-10-CM | POA: Diagnosis not present

## 2020-01-14 DIAGNOSIS — Z6841 Body Mass Index (BMI) 40.0 and over, adult: Secondary | ICD-10-CM

## 2020-01-14 DIAGNOSIS — I10 Essential (primary) hypertension: Secondary | ICD-10-CM

## 2020-01-14 DIAGNOSIS — R5383 Other fatigue: Secondary | ICD-10-CM

## 2020-01-14 DIAGNOSIS — Z0289 Encounter for other administrative examinations: Secondary | ICD-10-CM

## 2020-01-14 DIAGNOSIS — R7303 Prediabetes: Secondary | ICD-10-CM

## 2020-01-14 DIAGNOSIS — E7849 Other hyperlipidemia: Secondary | ICD-10-CM | POA: Diagnosis not present

## 2020-01-14 NOTE — Progress Notes (Signed)
Dear Dr. Gwenlyn Found,   Thank you for referring Mary Mcfarland to our clinic. The following note includes my evaluation and treatment recommendations.  Chief Complaint:   OBESITY Mary Mcfarland (MR# [id number]) is a 64 y.o. female who presents for evaluation and treatment of obesity and related comorbidities. Current BMI is Body mass index is 46.26 kg/m. Mary Mcfarland has been struggling with her weight for many years and has been unsuccessful in either losing weight, maintaining weight loss, or reaching her healthy weight goal.  Mary Mcfarland is currently in the action stage of change and ready to dedicate time achieving and maintaining a healthier weight. Mary Mcfarland is interested in becoming our patient and working on intensive lifestyle modifications including (but not limited to) diet and exercise for weight loss.  Referred by Dr. Gwenlyn Found, but she changed to Dr. Harrell Gave.  She is retired and lives alone.  She says she lost 80 pounds on her own in 2015 when she divorced from her husband.  She did not change what she ate, but she ate less and moved and exercised 1 hour per day.  She is currently having issues with her heart and is in workup with Cardiology at this time.  Dr. Harrell Gave told her to do activity as tolerated, but not to "exercise" at this time.  Mary Mcfarland habits were reviewed today and are as follows: her desired weight loss is 128 pounds, she has been heavy most of her life, she started gaining excessive weight within the last 6 months, her heaviest weight ever was 303 pounds, she skips meals frequently, she frequently eats larger portions than normal and she struggles with emotional eating.  Depression Screen Tommye's Food and Mood (modified PHQ-9) score was 14.  Depression screen PHQ 2/9 01/14/2020  Decreased Interest 3  Down, Depressed, Hopeless 3  PHQ - 2 Score 6  Altered sleeping 2  Tired, decreased energy 3  Change in appetite 1  Feeling bad or failure about yourself  0    Trouble concentrating 2  Moving slowly or fidgety/restless 0  Suicidal thoughts 0  PHQ-9 Score 14  Difficult doing work/chores Not difficult at all   Subjective:   1. Other fatigue Bernedette denies daytime somnolence and denies waking up still tired. Patent has a history of symptoms of snoring. Katiya generally gets 8 hours of sleep per night, and states that she has poor quality sleep. Snoring is present. Apneic episodes are not present. Epworth Sleepiness Score is 2.  2. SOB (shortness of breath) on exertion Aslyn notes increasing shortness of breath with exercising and seems to be worsening over time with weight gain. She notes getting out of breath sooner with activity than she used to. This has gotten worse recently. Mary Mcfarland denies shortness of breath at rest or orthopnea.  3. Atrial fibrillation, unspecified type (HCC) Amarra is taking diltiazem, Lasix, Eliquis, and Tikosyn per Cardiology.  She was recently diagnosed by Dr. Harrington Challenger (PCP), not associated with [facility], and was sent to Cardiology.  She is being worked up for DOE/SOB.  No history of cardiac cath, but there has been talk of that, she says.  4. Prediabetes Mary Mcfarland has a diagnosis of prediabetes based on her elevated HgA1c and was informed this puts her at greater risk of developing diabetes. She continues to work on diet and exercise to decrease her risk of diabetes. She denies nausea or hypoglycemia.  A1c was done 1-2 months ago with PCP.  She is not sure what the value was, but  this is a new diagnosis for her.  5. Essential hypertension Review: taking medications as instructed, no medication side effects noted, no chest pain on exertion, no dyspnea on exertion, no swelling of ankles.  She is taking lisinopril per Cardiology.  Blood pressure is at goal.    BP Readings from Last 3 Encounters:  01/14/20 135/70  12/30/19 (!) 146/81  12/20/19 130/72   6. Other hyperlipidemia Mary Mcfarland has hyperlipidemia and has been trying to improve  her cholesterol levels with intensive lifestyle modification including a low saturated fat diet, exercise and weight loss. She denies any chest pain, claudication or myalgias.  She is taking Lipitor per Cardiology.  Lab Results  Component Value Date   ALT 22 01/14/2020   AST 18 01/14/2020   ALKPHOS 106 01/14/2020   BILITOT 0.3 01/14/2020   Lab Results  Component Value Date   CHOL 168 01/14/2020   HDL 61 01/14/2020   LDLCALC 92 01/14/2020   TRIG 78 01/14/2020   CHOLHDL 3.1 11/13/2019   7. Vitamin D deficiency She is currently taking no vitamin D supplement.   8. Bipolar affective disorder, remission status unspecified ([place]) She is treated by [facility], who prescribes her Wellbutrin.  9. Depression screen Valory was screened for depression as part of her new patient workup.  PHQ-9 is 14.  Assessment/Plan:   1. Other fatigue Mary Mcfarland does feel that her weight is causing her energy to be lower than it should be. Fatigue may be related to obesity, depression or many other causes. Labs will be ordered, and in the meanwhile, Eron will focus on self care including making healthy food choices, increasing physical activity and focusing on stress reduction. - Vitamin B12 - Folate - TSH - T4 - T3  2. SOB (shortness of breath) on exertion Mary Mcfarland does feel that she gets out of breath more easily that she used to when she exercises. Mary Mcfarland shortness of breath appears to be obesity related and exercise induced. She has agreed to work on weight loss and gradually increase exercise to treat her exercise induced shortness of breath. Will continue to monitor closely.  3. Atrial fibrillation, unspecified type [facility]) Treatment per Cardiology.  Medical management per PCP and Cardiology.  4. Prediabetes Kamarie will continue to work on weight loss, exercise, and decreasing simple carbohydrates to help decrease the risk of diabetes.  - Hemoglobin A1c - Insulin, random  5. Essential  hypertension Mary Mcfarland is working on healthy weight loss and exercise to improve blood pressure control. We will watch for signs of hypotension as she continues her lifestyle modifications.  Treatment per Cardiology/PCP.  We will monitor closely.  Continue prudent nutritional plan, weight loss. - Comprehensive metabolic panel  6. Other hyperlipidemia Cardiovascular risk and specific lipid/LDL goals reviewed.  We discussed several lifestyle modifications today and Mary Mcfarland will continue to work on diet, exercise and weight loss efforts. Orders and follow up as documented in patient record.   Counseling Intensive lifestyle modifications are the first line treatment for this issue. . Dietary changes: Increase soluble fiber. Decrease simple carbohydrates. . Exercise changes: Moderate to vigorous-intensity aerobic activity 150 minutes per week if tolerated. . Lipid-lowering medications: see documented in medical record. - Lipid Panel With LDL/HDL Ratio  7. Vitamin D deficiency Will check vitamin D level today. - VITAMIN D 25 Hydroxy (Vit-D Deficiency, Fractures)  8. Bipolar affective disorder, remission status unspecified ([place]) Treatment plan per PCP and Psychiatry.   9. Depression screen Mary Mcfarland had a positive depression screening. Depression is  commonly associated with obesity and often results in emotional eating behaviors. We will monitor this closely and work on CBT to help improve the non-hunger eating patterns. Referral to Psychology may be required if no improvement is seen as she continues in our clinic.  10. Class 3 severe obesity with serious comorbidity and body mass index (BMI) of 45.0 to 49.9 in adult, unspecified obesity type (HCC) Mary Mcfarland is currently in the action stage of change and her goal is to continue with weight loss efforts. I recommend Mary Mcfarland begin the structured treatment plan as follows:  She has agreed to the Category 2 Plan.  Exercise goals: As is.   Behavioral  modification strategies: increasing lean protein intake, decreasing simple carbohydrates, increasing vegetables, no skipping meals, keeping healthy foods in the home and planning for success.  She was informed of the importance of frequent follow-up visits to maximize her success with intensive lifestyle modifications for her multiple health conditions. She was informed we would discuss her lab results at her next visit unless there is a critical issue that needs to be addressed sooner. Mary Mcfarland agreed to keep her next visit at the agreed upon time to discuss these results.  Objective:   Blood pressure 135/70, pulse 86, temperature 98.3 F (36.8 C), temperature source Oral, height 5\' 5"  (1.651 m), weight 278 lb (126.1 kg), last menstrual period 09/24/2013, SpO2 97 %. Body mass index is 46.26 kg/m.  Indirect Calorimeter completed today shows a VO2 of 216 and a REE of 1500.  Her calculated basal metabolic rate is 1030 thus her basal metabolic rate is worse than expected.  General: Cooperative, alert, well developed, in no acute distress. HEENT: Conjunctivae and lids unremarkable. Cardiovascular: Regular rhythm.  Lungs: Normal work of breathing. Neurologic: No focal deficits.   Lab Results  Component Value Date   CREATININE 0.84 01/14/2020   BUN 27 01/14/2020   NA 142 01/14/2020   K 4.3 01/14/2020   CL 102 01/14/2020   CO2 23 01/14/2020   Lab Results  Component Value Date   ALT 22 01/14/2020   AST 18 01/14/2020   ALKPHOS 106 01/14/2020   BILITOT 0.3 01/14/2020   Lab Results  Component Value Date   TSH 3.610 01/14/2020   Lab Results  Component Value Date   CHOL 168 01/14/2020   HDL 61 01/14/2020   LDLCALC 92 01/14/2020   TRIG 78 01/14/2020   CHOLHDL 3.1 11/13/2019   Lab Results  Component Value Date   WBC 10.1 12/02/2019   HGB 12.4 12/02/2019   HCT 38.7 12/02/2019   MCV 91.1 12/02/2019   PLT 299 12/02/2019   Attestation Statements:   Reviewed by clinician on day of  visit: allergies, medications, problem list, medical history, surgical history, family history, social history, and previous encounter notes.  Time spent on visit including pre-visit chart review and post-visit charting and care was 60 minutes.   I, Water quality scientist, CMA, am acting as Location manager for [employer], DO.  I have reviewed the above documentation for accuracy and completeness, and I agree with the above. -Mellody Dance, DO

## 2020-01-15 ENCOUNTER — Other Ambulatory Visit (HOSPITAL_COMMUNITY): Payer: Self-pay | Admitting: Nurse Practitioner

## 2020-01-15 DIAGNOSIS — I4819 Other persistent atrial fibrillation: Secondary | ICD-10-CM | POA: Diagnosis not present

## 2020-01-15 DIAGNOSIS — M5441 Lumbago with sciatica, right side: Secondary | ICD-10-CM | POA: Diagnosis not present

## 2020-01-15 LAB — COMPREHENSIVE METABOLIC PANEL
ALT: 22 IU/L (ref 0–32)
AST: 18 IU/L (ref 0–40)
Albumin/Globulin Ratio: 1.6 (ref 1.2–2.2)
Albumin: 4.5 g/dL (ref 3.8–4.8)
Alkaline Phosphatase: 106 IU/L (ref 48–121)
BUN/Creatinine Ratio: 32 — ABNORMAL HIGH (ref 12–28)
BUN: 27 mg/dL (ref 8–27)
Bilirubin Total: 0.3 mg/dL (ref 0.0–1.2)
CO2: 23 mmol/L (ref 20–29)
Calcium: 9.9 mg/dL (ref 8.7–10.3)
Chloride: 102 mmol/L (ref 96–106)
Creatinine, Ser: 0.84 mg/dL (ref 0.57–1.00)
GFR calc Af Amer: 86 mL/min/{1.73_m2} (ref >59)
GFR calc non Af Amer: 75 mL/min/{1.73_m2} (ref >59)
Globulin, Total: 2.8 g/dL (ref 1.5–4.5)
Glucose: 110 mg/dL — ABNORMAL HIGH (ref 65–99)
Potassium: 4.3 mmol/L (ref 3.5–5.2)
Sodium: 142 mmol/L (ref 134–144)
Total Protein: 7.3 g/dL (ref 6.0–8.5)

## 2020-01-15 LAB — HEMOGLOBIN A1C
Est. average glucose Bld gHb Est-mCnc: 128 mg/dL
Hgb A1c MFr Bld: 6.1 % — ABNORMAL HIGH (ref 4.8–5.6)

## 2020-01-15 LAB — LIPID PANEL WITH LDL/HDL RATIO
Cholesterol, Total: 168 mg/dL (ref 100–199)
HDL: 61 mg/dL (ref >39)
LDL Chol Calc (NIH): 92 mg/dL (ref 0–99)
LDL/HDL Ratio: 1.5 ratio (ref 0.0–3.2)
Triglycerides: 78 mg/dL (ref 0–149)
VLDL Cholesterol Cal: 15 mg/dL (ref 5–40)

## 2020-01-15 LAB — VITAMIN D 25 HYDROXY (VIT D DEFICIENCY, FRACTURES): Vit D, 25-Hydroxy: 35.7 ng/mL (ref 30.0–100.0)

## 2020-01-15 LAB — INSULIN, RANDOM: INSULIN: 60.5 u[IU]/mL — ABNORMAL HIGH (ref 2.6–24.9)

## 2020-01-15 LAB — T3: T3, Total: 143 ng/dL (ref 71–180)

## 2020-01-15 LAB — TSH: TSH: 3.61 u[IU]/mL (ref 0.450–4.500)

## 2020-01-15 LAB — VITAMIN B12: Vitamin B-12: 375 pg/mL (ref 232–1245)

## 2020-01-15 LAB — FOLATE: Folate: 7.5 ng/mL (ref >3.0)

## 2020-01-15 LAB — T4: T4, Total: 7.6 ug/dL (ref 4.5–12.0)

## 2020-01-15 NOTE — Addendum Note (Signed)
Encounter addended by: Juluis Mire, RN on: 01/15/2020 2:27 PM  Actions taken: Imaging Exam ended

## 2020-01-20 ENCOUNTER — Other Ambulatory Visit: Payer: Self-pay | Admitting: Medical

## 2020-01-20 MED ORDER — FUROSEMIDE 40 MG PO TABS: 40.0000 mg | ORAL_TABLET | Freq: Every day | ORAL | 3 refills | Status: DC

## 2020-01-21 ENCOUNTER — Ambulatory Visit: Payer: PPO | Admitting: Internal Medicine

## 2020-01-21 ENCOUNTER — Other Ambulatory Visit: Payer: Self-pay

## 2020-01-21 ENCOUNTER — Encounter: Payer: Self-pay | Admitting: Internal Medicine

## 2020-01-21 VITALS — BP 122/86 | HR 71 | Ht 65.0 in | Wt 278.4 lb

## 2020-01-21 DIAGNOSIS — I10 Essential (primary) hypertension: Secondary | ICD-10-CM | POA: Diagnosis not present

## 2020-01-21 DIAGNOSIS — G4733 Obstructive sleep apnea (adult) (pediatric): Secondary | ICD-10-CM

## 2020-01-21 DIAGNOSIS — I4819 Other persistent atrial fibrillation: Secondary | ICD-10-CM

## 2020-01-21 DIAGNOSIS — I484 Atypical atrial flutter: Secondary | ICD-10-CM

## 2020-01-21 NOTE — H&P (View-Only) (Signed)
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  I saw her last at time of her tikosyn load.  Though her AF has improved, she has begun having episodes of dizziness.  This has been shown to be due to sinus pauses.  She also has frequent sinus bradycardia with symptoms of fatigue.  Today, she denies symptoms of palpitations, chest pain, shortness of breath,  lower extremity edema, dizziness, presyncope, or syncope.  The patient is otherwise without complaint today.   Past Medical History:  Diagnosis Date  . Asthma   . Atrial fibrillation ([place])   . Back pain   . Bipolar disorder ([place])   . Constipation   . Depression   . Difficult intubation    Narrow airway  . Heartburn   . High cholesterol   . Hypertension   . Longstanding persistent atrial fibrillation ([place])   . Palpitations   . Pinched vertebral nerve    sciatica  . Prediabetes   . Sleep apnea    sleep study was "inconclusive" per patient  . SOB (shortness of breath)   . Swelling of both lower extremities   . Visit for monitoring Tikosyn therapy 08/27/2019  . Vitamin D deficiency    Past Surgical History:  Procedure Laterality Date  . bone spur surgery    . CARDIOVERSION N/A 07/24/2019   Procedure: CARDIOVERSION;  Surgeon: Buford Dresser, MD;  Location: [facility] ENDOSCOPY;  Service: Cardiovascular;  Laterality: N/A;  . COLONOSCOPY    . FOOT SURGERY  2017  . HEEL SPUR RESECTION Left 03/15/2016   Procedure: ACHILLIES TENDON REPAIR AND PUMP BUMP EXCISION;  Surgeon: Melrose Nakayama, MD;  Location: [place];  Service: Orthopedics;  Laterality: Left;  . SHOULDER SURGERY  2010  . TOE SURGERY  2011  . TONSILLECTOMY      ROS- all systems are reviewed and negatives except as per HPI above  Current Outpatient Medications  Medication Sig Dispense Refill  . albuterol (PROVENTIL HFA;VENTOLIN HFA) 108 (90 Base) MCG/ACT inhaler  Inhale 2 puffs into the lungs every 6 (six) hours as needed for wheezing or shortness of breath.    Marland Kitchen apixaban (ELIQUIS) 5 MG TABS tablet Take 1 tablet (5 mg total) by mouth 2 (two) times daily. 60 tablet 5  . atorvastatin (LIPITOR) 40 MG tablet Take 1 tablet (40 mg total) by mouth daily. 90 tablet 3  . buPROPion (WELLBUTRIN SR) 150 MG 12 hr tablet Take 300 mg by mouth daily.    Marland Kitchen diltiazem (CARDIZEM CD) 120 MG 24 hr capsule Take 1 capsule (120 mg total) by mouth daily. 30 capsule 3  . dofetilide (TIKOSYN) 250 MCG capsule Take 1 capsule (250 mcg total) by mouth 2 (two) times daily. 60 capsule 6  . furosemide (LASIX) 40 MG tablet Take 1 tablet (40 mg total) by mouth daily. 90 tablet 3  . ibuprofen (ADVIL) 800 MG tablet Take 800 mg by mouth as needed (for pain).     Marland Kitchen lisinopril (ZESTRIL) 20 MG tablet Take 1 tablet by mouth once daily 90 tablet 1  . Naphazoline-Pheniramine (OPCON-A) 0.027-0.315 % SOLN Place 1-2 drops into both eyes 3 (three) times daily as needed (for allergies).     Marland Kitchen omeprazole (PRILOSEC) 20 MG capsule Take 20 mg by mouth daily before breakfast.      No current facility-administered medications for this visit.    Physical Exam: Vitals:  01/21/20 1550  BP: 122/86  Pulse: 71  SpO2: 95%  Weight: 278 lb 6.4 oz (126.3 kg)  Height: 5\' 5"  (1.651 m)    GEN- The patient is obese appearing, alert and oriented x 3 today.   Head- normocephalic, atraumatic Eyes-  Sclera clear, conjunctiva pink Ears- hearing intact Oropharynx- clear Lungs-   normal work of breathing Heart- Regular rate and rhythm  GI- soft  Extremities- no clubbing, cyanosis, or edema  Wt Readings from Last 3 Encounters:  01/21/20 278 lb 6.4 oz (126.3 kg)  01/14/20 278 lb (126.1 kg)  12/30/19 277 lb (125.6 kg)   Echo 07/01/19-- EF 60%, moderate LA enlargement  EKG tracing ordered today is personally reviewed and shows sinus with brief junctional rhythm, RBBB  Assessment and Plan:  1. Tachycardia/  bradycardia syndrome She has frequent atrial ectopy requiring tikosyn and diltiazem.  She is also having frequent symptomatic sinus pauses and sick sinus syndrome. The patient has symptomatic bradycardia.  I would therefore recommend pacemaker implantation at this time.  Risks, benefits, alternatives to pacemaker implantation were discussed in detail with the patient today. The patient understands that the risks include but are not limited to bleeding, infection, pneumothorax, perforation, tamponade, vascular damage, renal failure, MI, stroke, death,  and lead dislodgement and wishes to proceed. We will therefore schedule the procedure at the next available time.   Hold eliquis 24 hours prior to the procedure.  2. Persistent afib Doing reasonably well with tikosyn  3. Obesity Body mass index is 46.33 kg/m. Lifestyle modification was discussed at length today  4. HTN Stable No change required today   Risks, benefits and potential toxicities for medications prescribed and/or refilled reviewed with patient today.   Thompson Grayer MD, [facility] 01/21/2020 4:11 PM

## 2020-01-21 NOTE — Patient Instructions (Addendum)
Medication Instructions:  Your physician recommends that you continue on your current medications as directed. Please refer to the Current Medication list given to you today.  Labwork: You will get lab work today:  CBC  Testing/Procedures: None ordered.  Follow-Up:  SEE INSTRUCTION LETTER  Any Other Special Instructions Will Be Listed Below (If Applicable).  If you need a refill on your cardiac medications before your next appointment, please call your pharmacy.    Pacemaker Implantation, Adult Pacemaker implantation is a procedure to place a pacemaker inside your chest. A pacemaker is a small computer that sends electrical signals to the heart and helps your heart beat normally. A pacemaker also stores information about your heart rhythms. You may need pacemaker implantation if you:  Have a slow heartbeat (bradycardia).  Faint (syncope).  Have shortness of breath (dyspnea) due to heart problems. The pacemaker attaches to your heart through a wire, called a lead. Sometimes just one lead is needed. Other times, there will be two leads. There are two types of pacemakers:  Transvenous pacemaker. This type is placed under the skin or muscle of your chest. The lead goes through a vein in the chest area to reach the inside of the heart.  Epicardial pacemaker. This type is placed under the skin or muscle of your chest or belly. The lead goes through your chest to the outside of the heart. Tell a health care provider about:  Any allergies you have.  All medicines you are taking, including vitamins, herbs, eye drops, creams, and over-the-counter medicines.  Any problems you or family members have had with anesthetic medicines.  Any blood or bone disorders you have.  Any surgeries you have had.  Any medical conditions you have.  Whether you are pregnant or may be pregnant. What are the risks? Generally, this is a safe procedure. However, problems may occur,  including:  Infection.  Bleeding.  Failure of the pacemaker or the lead.  Collapse of a lung or bleeding into a lung.  Blood clot inside a blood vessel with a lead.  Damage to the heart.  Infection inside the heart (endocarditis).  Allergic reactions to medicines. What happens before the procedure? Staying hydrated Follow instructions from your health care provider about hydration, which may include:  Up to 2 hours before the procedure - you may continue to drink clear liquids, such as water, clear fruit juice, black coffee, and plain tea. Eating and drinking restrictions Follow instructions from your health care provider about eating and drinking, which may include:  8 hours before the procedure - stop eating heavy meals or foods such as meat, fried foods, or fatty foods.  6 hours before the procedure - stop eating light meals or foods, such as toast or cereal.  6 hours before the procedure - stop drinking milk or drinks that contain milk.  2 hours before the procedure - stop drinking clear liquids. Medicines  Ask your health care provider about: ? Changing or stopping your regular medicines. This is especially important if you are taking diabetes medicines or blood thinners. ? Taking medicines such as aspirin and ibuprofen. These medicines can thin your blood. Do not take these medicines before your procedure if your health care provider instructs you not to.  You may be given antibiotic medicine to help prevent infection. General instructions  You will have a heart evaluation. This may include an electrocardiogram (ECG), chest X-ray, and heart imaging (echocardiogram,  or echo) tests.  You will have blood tests.  Do not use any products that contain nicotine or tobacco, such as cigarettes and e-cigarettes. If you need help quitting, ask your health care provider.  Plan to have someone take you home from the hospital or clinic.  If you will be going home right after  the procedure, plan to have someone with you for 24 hours.  Ask your health care provider how your surgical site will be marked or identified. What happens during the procedure?  To reduce your risk of infection: ? Your health care team will wash or sanitize their hands. ? Your skin will be washed with soap. ? Hair may be removed from the surgical area.  An IV tube will be inserted into one of your veins.  You will be given one or more of the following: ? A medicine to help you relax (sedative). ? A medicine to numb the area (local anesthetic). ? A medicine to make you fall asleep (general anesthetic).  If you are getting a transvenous pacemaker: ? An incision will be made in your upper chest. ? A pocket will be made for the pacemaker. It may be placed under the skin or between layers of muscle. ? The lead will be inserted into a blood vessel that returns to the heart. ? While X-rays are taken by an imaging machine (fluoroscopy), the lead will be advanced through the vein to the inside of your heart. ? The other end of the lead will be tunneled under the skin and attached to the pacemaker.  If you are getting an epicardial pacemaker: ? An incision will be made near your ribs or breastbone (sternum) for the lead. ? The lead will be attached to the outside of your heart. ? Another incision will be made in your chest or upper belly to create a pocket for the pacemaker. ? The free end of the lead will be tunneled under the skin and attached to the pacemaker.  The transvenous or epicardial pacemaker will be tested. Imaging studies may be done to check the lead position.  The incisions will be closed with stitches (sutures), adhesive strips, or skin glue.  Bandages (dressing) will be placed over the incisions. The procedure may vary among health care providers and hospitals. What happens after the procedure?  Your blood pressure, heart rate, breathing rate, and blood oxygen level will  be monitored until the medicines you were given have worn off.  You will be given antibiotics and pain medicine.  ECG and chest x-rays will be done.  You will wear a continuous type of ECG (Holter monitor) to check your heart rhythm.  Your health care provider will program the pacemaker.  Do not drive for 24 hours if you received a sedative. This information is not intended to replace advice given to you by your health care provider. Make sure you discuss any questions you have with your health care provider. Document Revised: 02/16/2018 Document Reviewed: 11/11/2015 Elsevier Patient Education  [place].

## 2020-01-21 NOTE — Progress Notes (Signed)
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  I saw her last at time of her tikosyn load.  Though her AF has improved, she has begun having episodes of dizziness.  This has been shown to be due to sinus pauses.  She also has frequent sinus bradycardia with symptoms of fatigue.  Today, she denies symptoms of palpitations, chest pain, shortness of breath,  lower extremity edema, dizziness, presyncope, or syncope.  The patient is otherwise without complaint today.   Past Medical History:  Diagnosis Date   Asthma    Atrial fibrillation (HCC)    Back pain    Bipolar disorder (HCC)    Constipation    Depression    Difficult intubation    Narrow airway   Heartburn    High cholesterol    Hypertension    Longstanding persistent atrial fibrillation (HCC)    Palpitations    Pinched vertebral nerve    sciatica   Prediabetes    Sleep apnea    sleep study was "inconclusive" per patient   SOB (shortness of breath)    Swelling of both lower extremities    Visit for monitoring Tikosyn therapy 08/27/2019   Vitamin D deficiency    Past Surgical History:  Procedure Laterality Date   bone spur surgery     CARDIOVERSION N/A 07/24/2019   Procedure: CARDIOVERSION;  Surgeon: Buford Dresser, MD;  Location: [facility];  Service: Cardiovascular;  Laterality: N/A;   COLONOSCOPY     FOOT SURGERY  2017   HEEL SPUR RESECTION Left 03/15/2016   Procedure: ACHILLIES TENDON REPAIR AND PUMP BUMP EXCISION;  Surgeon: Melrose Nakayama, MD;  Location: [facility];  Service: Orthopedics;  Laterality: Left;   SHOULDER SURGERY  2010   TOE SURGERY  2011   TONSILLECTOMY      ROS- all systems are reviewed and negatives except as per HPI above  Current Outpatient Medications  Medication Sig Dispense Refill   albuterol (PROVENTIL HFA;VENTOLIN HFA) 108 (90 Base) MCG/ACT inhaler  Inhale 2 puffs into the lungs every 6 (six) hours as needed for wheezing or shortness of breath.     apixaban (ELIQUIS) 5 MG TABS tablet Take 1 tablet (5 mg total) by mouth 2 (two) times daily. 60 tablet 5   atorvastatin (LIPITOR) 40 MG tablet Take 1 tablet (40 mg total) by mouth daily. 90 tablet 3   buPROPion (WELLBUTRIN SR) 150 MG 12 hr tablet Take 300 mg by mouth daily.     diltiazem (CARDIZEM CD) 120 MG 24 hr capsule Take 1 capsule (120 mg total) by mouth daily. 30 capsule 3   dofetilide (TIKOSYN) 250 MCG capsule Take 1 capsule (250 mcg total) by mouth 2 (two) times daily. 60 capsule 6   furosemide (LASIX) 40 MG tablet Take 1 tablet (40 mg total) by mouth daily. 90 tablet 3   ibuprofen (ADVIL) 800 MG tablet Take 800 mg by mouth as needed (for pain).      lisinopril (ZESTRIL) 20 MG tablet Take 1 tablet by mouth once daily 90 tablet 1   Naphazoline-Pheniramine (OPCON-A) 0.027-0.315 % SOLN Place 1-2 drops into both eyes 3 (three) times daily as needed (for allergies).      omeprazole (PRILOSEC) 20 MG capsule Take 20 mg by mouth daily before breakfast.      No current facility-administered medications for this visit.    Physical Exam: Vitals:  01/21/20 1550  BP: 122/86  Pulse: 71  SpO2: 95%  Weight: 278 lb 6.4 oz (126.3 kg)  Height: 5\' 5"  (1.651 m)    GEN- The patient is obese appearing, alert and oriented x 3 today.   Head- normocephalic, atraumatic Eyes-  Sclera clear, conjunctiva pink Ears- hearing intact Oropharynx- clear Lungs-   normal work of breathing Heart- Regular rate and rhythm  GI- soft  Extremities- no clubbing, cyanosis, or edema  Wt Readings from Last 3 Encounters:  01/21/20 278 lb 6.4 oz (126.3 kg)  01/14/20 278 lb (126.1 kg)  12/30/19 277 lb (125.6 kg)   Echo 07/01/19-- EF 60%, moderate LA enlargement  EKG tracing ordered today is personally reviewed and shows sinus with brief junctional rhythm, RBBB  Assessment and Plan:  1. Tachycardia/  bradycardia syndrome She has frequent atrial ectopy requiring tikosyn and diltiazem.  She is also having frequent symptomatic sinus pauses and sick sinus syndrome. The patient has symptomatic bradycardia.  I would therefore recommend pacemaker implantation at this time.  Risks, benefits, alternatives to pacemaker implantation were discussed in detail with the patient today. The patient understands that the risks include but are not limited to bleeding, infection, pneumothorax, perforation, tamponade, vascular damage, renal failure, MI, stroke, death,  and lead dislodgement and wishes to proceed. We will therefore schedule the procedure at the next available time.   Hold eliquis 24 hours prior to the procedure.  2. Persistent afib Doing reasonably well with tikosyn  3. Obesity Body mass index is 46.33 kg/m. Lifestyle modification was discussed at length today  4. HTN Stable No change required today   Risks, benefits and potential toxicities for medications prescribed and/or refilled reviewed with patient today.   Thompson Grayer MD, [facility] 01/21/2020 4:11 PM

## 2020-01-22 LAB — CBC WITH DIFFERENTIAL/PLATELET
Basophils Absolute: 0.1 10*3/uL (ref 0.0–0.2)
Basos: 1 %
EOS (ABSOLUTE): 0.3 10*3/uL (ref 0.0–0.4)
Eos: 2 %
Hematocrit: 41.6 % (ref 34.0–46.6)
Hemoglobin: 13.5 g/dL (ref 11.1–15.9)
Immature Grans (Abs): 0 10*3/uL (ref 0.0–0.1)
Immature Granulocytes: 0 %
Lymphocytes Absolute: 2.6 10*3/uL (ref 0.7–3.1)
Lymphs: 19 %
MCH: 28.8 pg (ref 26.6–33.0)
MCHC: 32.5 g/dL (ref 31.5–35.7)
MCV: 89 fL (ref 79–97)
Monocytes Absolute: 1 10*3/uL — ABNORMAL HIGH (ref 0.1–0.9)
Monocytes: 7 %
Neutrophils Absolute: 9.8 10*3/uL — ABNORMAL HIGH (ref 1.4–7.0)
Neutrophils: 71 %
Platelets: 382 10*3/uL (ref 150–450)
RBC: 4.68 x10E6/uL (ref 3.77–5.28)
RDW: 12.6 % (ref 11.7–15.4)
WBC: 13.9 10*3/uL — ABNORMAL HIGH (ref 3.4–10.8)

## 2020-01-24 ENCOUNTER — Ambulatory Visit (HOSPITAL_COMMUNITY): Payer: Self-pay | Admitting: Physician Assistant

## 2020-01-28 ENCOUNTER — Ambulatory Visit (INDEPENDENT_AMBULATORY_CARE_PROVIDER_SITE_OTHER): Payer: Self-pay | Admitting: Family Medicine

## 2020-01-28 NOTE — Addendum Note (Signed)
Addended by: Neomia Dear on: 01/28/2020 02:21 PM   Modules accepted: Level of Service

## 2020-01-29 ENCOUNTER — Encounter: Payer: Self-pay | Admitting: Cardiology

## 2020-01-29 ENCOUNTER — Ambulatory Visit: Payer: PPO | Admitting: Cardiology

## 2020-01-29 ENCOUNTER — Other Ambulatory Visit: Payer: Self-pay

## 2020-01-29 VITALS — BP 150/70 | HR 55 | Ht 65.0 in | Wt 280.6 lb

## 2020-01-29 DIAGNOSIS — I10 Essential (primary) hypertension: Secondary | ICD-10-CM | POA: Diagnosis not present

## 2020-01-29 DIAGNOSIS — E782 Mixed hyperlipidemia: Secondary | ICD-10-CM | POA: Diagnosis not present

## 2020-01-29 DIAGNOSIS — R0600 Dyspnea, unspecified: Secondary | ICD-10-CM

## 2020-01-29 DIAGNOSIS — R0609 Other forms of dyspnea: Secondary | ICD-10-CM

## 2020-01-29 DIAGNOSIS — R600 Localized edema: Secondary | ICD-10-CM

## 2020-01-29 DIAGNOSIS — I480 Paroxysmal atrial fibrillation: Secondary | ICD-10-CM

## 2020-01-29 MED ORDER — FUROSEMIDE 40 MG PO TABS: 40.0000 mg | ORAL_TABLET | Freq: Every day | ORAL | 3 refills | Status: DC | PRN

## 2020-01-29 NOTE — Patient Instructions (Addendum)
Medication Instructions:  Lasix - Take 1 tablet (40 mg total) by mouth daily as needed. In addition to 40 mg daily dose. Take when your weight is up 5 lbs from normal  *If you need a refill on your cardiac medications before your next appointment, please call your pharmacy*   Lab Work: None   Testing/Procedures: None   Follow-Up: At [facility], you and your health needs are our priority.  As part of our continuing mission to provide you with exceptional heart care, we have created designated Provider Care Teams.  These Care Teams include your primary Cardiologist (physician) and Advanced Practice Providers (APPs -  Physician Assistants and Nurse Practitioners) who all work together to provide you with the care you need, when you need it.  We recommend signing up for the patient portal called "MyChart".  Sign up information is provided on this After Visit Summary.  MyChart is used to connect with patients for Virtual Visits (Telemedicine).  Patients are able to view lab/test results, encounter notes, upcoming appointments, etc.  Non-urgent messages can be sent to your provider as well.   To learn more about what you can do with MyChart, go to [email or web address].    Your next appointment:   8 week(s)  The format for your next appointment:   In Person  Provider:   Buford Dresser, MD

## 2020-01-29 NOTE — Progress Notes (Signed)
Cardiology Office Note:    Date:  01/29/2020   ID:  Mary Mcfarland, DOB Oct 30, 1955, MRN [id number]  PCP:  Lawerance Cruel, MD  Cardiologist:  Buford Dresser, MD  EP: Dr. Rayann Heman  Referring MD: Lawerance Cruel, MD   CC: follow up  History of Present Illness:    Mary Mcfarland is a 64 y.o. female with a hx of atrial fibrillation, sinus pauses pending PPM, hypertension, hyperlipidemia, obesity who is seen for follow up today. I initially met her 12/30/19.  Today: Having pacemaker put in next week by Dr. Rayann Heman. Told not to drive until it is put in. She is very anxious about this, thinks this is why her BP is up. Hasn't been sleeping well. Reviewed her monitor images with her, she understands why the pacemaker will be helpful to prevent the pauses.  Has gained weight, thinks it is fluid. Thinks she has put on 5-6 lbs. No changes to eating, etc. Asking about increasing lasix dose as needed, dones today (weight-based).  Discussed fatigue, dyspnea on exertion. Will given about 6 weeks for pacemaker to heal, then readdress. If still symptomatic, will discuss ischemia evaluation.  Denies chest pain, shortness of breath at rest or with normal exertion. No PND, orthopnea. No syncope or palpitations.  Past Medical History:  Diagnosis Date  . Asthma   . Atrial fibrillation ([place])   . Back pain   . Bipolar disorder ([place])   . Constipation   . Depression   . Difficult intubation    Narrow airway  . Heartburn   . High cholesterol   . Hypertension   . Longstanding persistent atrial fibrillation ([place])   . Palpitations   . Pinched vertebral nerve    sciatica  . Prediabetes   . Sleep apnea    sleep study was "inconclusive" per patient  . SOB (shortness of breath)   . Swelling of both lower extremities   . Visit for monitoring Tikosyn therapy 08/27/2019  . Vitamin D deficiency     Past Surgical History:  Procedure Laterality Date  . bone spur surgery    .  CARDIOVERSION N/A 07/24/2019   Procedure: CARDIOVERSION;  Surgeon: Buford Dresser, MD;  Location: [facility];  Service: Cardiovascular;  Laterality: N/A;  . COLONOSCOPY    . FOOT SURGERY  2017  . HEEL SPUR RESECTION Left 03/15/2016   Procedure: ACHILLIES TENDON REPAIR AND PUMP BUMP EXCISION;  Surgeon: Melrose Nakayama, MD;  Location: [facility];  Service: Orthopedics;  Laterality: Left;  . SHOULDER SURGERY  2010  . TOE SURGERY  2011  . TONSILLECTOMY      Current Medications: Current Outpatient Medications on File Prior to Visit  Medication Sig  . albuterol (PROVENTIL HFA;VENTOLIN HFA) 108 (90 Base) MCG/ACT inhaler Inhale 2 puffs into the lungs every 6 (six) hours as needed for wheezing or shortness of breath.  Marland Kitchen apixaban (ELIQUIS) 5 MG TABS tablet Take 1 tablet (5 mg total) by mouth 2 (two) times daily.  Marland Kitchen atorvastatin (LIPITOR) 40 MG tablet Take 1 tablet (40 mg total) by mouth daily.  Marland Kitchen buPROPion (WELLBUTRIN SR) 150 MG 12 hr tablet Take 300 mg by mouth daily.  Marland Kitchen diltiazem (CARDIZEM CD) 120 MG 24 hr capsule Take 1 capsule (120 mg total) by mouth daily.  Marland Kitchen dofetilide (TIKOSYN) 250 MCG capsule Take 1 capsule (250 mcg total) by mouth 2 (two) times daily.  . furosemide (LASIX) 40 MG tablet Take 1 tablet (40 mg total) by mouth daily.  Marland Kitchen  ibuprofen (ADVIL) 800 MG tablet Take 800 mg by mouth as needed (for pain).   Marland Kitchen lisinopril (ZESTRIL) 20 MG tablet Take 1 tablet by mouth once daily  . Naphazoline-Pheniramine (OPCON-A) 0.027-0.315 % SOLN Place 1-2 drops into both eyes 3 (three) times daily as needed (for allergies).   Marland Kitchen omeprazole (PRILOSEC) 20 MG capsule Take 20 mg by mouth daily before breakfast.    No current facility-administered medications on file prior to visit.     Allergies:   Niacin and Horse-derived products   Social History   Tobacco Use  . Smoking status: Former Smoker    Packs/day: 1.00    Types: Cigarettes    Quit date: 02/13/1996    Years since quitting: 23.9  .  Smokeless tobacco: Never Used  Vaping Use  . Vaping Use: Never used  Substance Use Topics  . Alcohol use: Yes    Alcohol/week: 3.0 - 4.0 standard drinks    Types: 2 Shots of liquor, 1 - 2 Standard drinks or equivalent per week    Comment: occassionally  . Drug use: Never    Family History: family history includes Alcoholism in her father; Bipolar disorder in her mother; CVA in her mother; Cancer in her mother; Depression in her mother; Diabetes in her father; Drug abuse in her father; Heart attack in her father; Heart disease in her father and mother; Hypertension in her father and mother; Liver disease in her father; Obesity in her father and mother; Stroke in her mother.  ROS:   Please see the history of present illness.  Additional pertinent ROS otherwise unremarkable.  EKGs/Labs/Other Studies Reviewed:    The following studies were reviewed today: -monitor 01/2020: Sinus rhythm Episodes of daytime sinus bradycardia, accelerated idioventricular rhythm, and sinus pauses are noted Frequent premature atrial contractions and nonsustained atrial tachycardia are noted  -Echo 06/2019: EF 60-65%, indeterminate DF, no significant valve disease. LA diameter 4.5 cm -Ca score 06/2019: Ca score 22. Reviewed what this means -attempted CT coronary 12/18/19, highly variable heart rate, test cancelled.  EKG:  EKG is personally reviewed.  The ekg ordered today demonstrates idioventricular rhythm, RBBB at 55 bpm  Recent Labs: 10/03/2019: B Natriuretic Peptide 208.7 11/13/2019: Magnesium 2.0 01/14/2020: ALT 22; BUN 27; Creatinine, Ser 0.84; Potassium 4.3; Sodium 142; TSH 3.610 01/21/2020: Hemoglobin 13.5; Platelets 382  Recent Lipid Panel    Component Value Date/Time   CHOL 168 01/14/2020 1212   TRIG 78 01/14/2020 1212   HDL 61 01/14/2020 1212   CHOLHDL 3.1 11/13/2019 0843   LDLCALC 92 01/14/2020 1212    Physical Exam:    VS:  BP (!) 150/70   Pulse (!) 55   Ht _0  (1.651 m)   Wt 280 lb 9.6 oz  (127.3 kg)   LMP 09/24/2013   SpO2 94%   BMI 46.69 kg/m     Wt Readings from Last 3 Encounters:  01/21/20 278 lb 6.4 oz (126.3 kg)  01/14/20 278 lb (126.1 kg)  12/30/19 277 lb (125.6 kg)    GEN: Well nourished, well developed in no acute distress HEENT: Normal, moist mucous membranes NECK: No JVD CARDIAC: regular rhythm, normal S1 and S2, no rubs or gallops. No murmur. VASCULAR: Radial and DP pulses 2+ bilaterally. No carotid bruits RESPIRATORY:  Clear to auscultation without rales, wheezing or rhonchi  ABDOMEN: Soft, non-tender, non-distended MUSCULOSKELETAL:  Ambulates independently SKIN: Warm and dry, trace bilateral LE edema NEUROLOGIC:  Alert and oriented x 3. No focal neuro deficits noted. PSYCHIATRIC:  Normal affect   ASSESSMENT:    1. DOE (dyspnea on exertion)   2. Lower leg edema   3. Paroxysmal atrial fibrillation (HCC)   4. Essential hypertension   5. Mixed hyperlipidemia   6. Morbid obesity (HCC)    PLAN:    Fatigue, dyspnea on exertion: -sinus pauses seen on monitor -pending pacemaker implantation by Dr. Rayann Heman given findings on her monitor -sleep study ordered -will see if symptoms improve post pacemaker  Atrial fibrillation, paroxysmal: -on dofetilide, diltiazem. Pending pacemaker by Dr. Rayann Heman -CHA2DS2/VAS Stroke Risk Points= 2 -continue apixaban, periprocedural instructions per RP  Hypertension: -elevated today -continue lisinopril, furosemide, diltiazem for now -recheck post pacemaker  Morbid obesity: -we discussed that weight loss is a long term goal. Will first treat high risk cardiovascular issues  Possible sleep apnea: -prior study indeterminate per patient, has repeat ordered  Hyperlipidemia: -continue atorvastatin  Cardiac risk counseling and prevention recommendations: -recommend heart healthy/Mediterranean diet, with whole grains, fruits, vegetable, fish, lean meats, nuts, and olive oil. Limit salt. -recommend moderate  walking, 3-5 times/week for 30-50 minutes each session. Aim for at least 150 minutes.week. Goal should be pace of 3 miles/hours, or walking 1.5 miles in 30 minutes -recommend avoidance of tobacco products. Avoid excess alcohol. -ASCVD risk score: The 10-year ASCVD risk score Mikey Bussing DC Brooke Bonito., et al., 2013) is: 4.2%   Values used to calculate the score:     Age: 54 years     Sex: Female     Is Non-Hispanic African American: No     Diabetic: No     Tobacco smoker: No     Systolic Blood Pressure: 779 mmHg     Is BP treated: Yes     HDL Cholesterol: 61 mg/dL     Total Cholesterol: 168 mg/dL    Plan for follow up: 8 weeks  Buford Dresser, MD, PhD [place]  [facility]    Medication Adjustments/Labs and Tests Ordered: Current medicines are reviewed at length with the patient today.  Concerns regarding medicines are outlined above.  Orders Placed This Encounter  Procedures  . EKG 12-Lead   Meds ordered this encounter  Medications  . DISCONTD: furosemide (LASIX) 40 MG tablet    Sig: Take 1 tablet (40 mg total) by mouth daily as needed. In addition to 40 mg daily dose. Take when your weight is up 5 lbs from normal.    Dispense:  90 tablet    Refill:  3    Please fill this as her as needed supply, in addition to her standing lasix daily dose. Needs both prescriptions (PRN and standing). Thanks.    Patient Instructions  Medication Instructions:  Lasix - Take 1 tablet (40 mg total) by mouth daily as needed. In addition to 40 mg daily dose. Take when your weight is up 5 lbs from normal  *If you need a refill on your cardiac medications before your next appointment, please call your pharmacy*   Lab Work: None   Testing/Procedures: None   Follow-Up: At [facility], you and your health needs are our priority.  As part of our continuing mission to provide you with exceptional heart care, we have created designated Provider Care Teams.  These Care Teams include your  primary Cardiologist (physician) and Advanced Practice Providers (APPs -  Physician Assistants and Nurse Practitioners) who all work together to provide you with the care you need, when you need it.  We recommend signing up for the patient portal called "MyChart".  Sign up information is provided on this After Visit Summary.  MyChart is used to connect with patients for Virtual Visits (Telemedicine).  Patients are able to view lab/test results, encounter notes, upcoming appointments, etc.  Non-urgent messages can be sent to your provider as well.   To learn more about what you can do with MyChart, go to [email or web address].    Your next appointment:   8 week(s)  The format for your next appointment:   In Person  Provider:   Buford Dresser, MD      Signed, Buford Dresser, MD PhD 01/29/2020    [place]

## 2020-01-30 ENCOUNTER — Ambulatory Visit (HOSPITAL_BASED_OUTPATIENT_CLINIC_OR_DEPARTMENT_OTHER): Payer: PPO | Attending: Student | Admitting: Cardiology

## 2020-01-30 DIAGNOSIS — I10 Essential (primary) hypertension: Secondary | ICD-10-CM

## 2020-01-30 DIAGNOSIS — R0683 Snoring: Secondary | ICD-10-CM

## 2020-01-30 DIAGNOSIS — I4811 Longstanding persistent atrial fibrillation: Secondary | ICD-10-CM

## 2020-01-30 DIAGNOSIS — Z79899 Other long term (current) drug therapy: Secondary | ICD-10-CM

## 2020-02-03 NOTE — Progress Notes (Signed)
This encounter was created in error - please disregard.

## 2020-02-03 NOTE — Procedures (Signed)
Erroneous encounter

## 2020-02-04 ENCOUNTER — Other Ambulatory Visit (HOSPITAL_COMMUNITY)
Admission: RE | Admit: 2020-02-04 | Discharge: 2020-02-04 | Disposition: A | Discharge status: Home / Self Care | Payer: PPO | Source: Ambulatory Visit | Attending: Internal Medicine | Admitting: Internal Medicine

## 2020-02-04 DIAGNOSIS — Z20822 Contact with and (suspected) exposure to covid-19: Secondary | ICD-10-CM | POA: Insufficient documentation

## 2020-02-04 DIAGNOSIS — Z01812 Encounter for preprocedural laboratory examination: Secondary | ICD-10-CM | POA: Diagnosis not present

## 2020-02-04 LAB — SARS CORONAVIRUS 2 (TAT 6-24 HRS): SARS Coronavirus 2: NEGATIVE

## 2020-02-05 NOTE — Progress Notes (Signed)
Attempted to call patient regarding procedure for tomorrow.  Left Instructions for patient on the following items: Arrival time 1130 May have light breakfas before 7am, Nothing to eat or drink after that No meds AM of procedure except  DOFETILIDE Responsible person to drive you home and stay with you for 24 hrs Wash with special soap night before and morning of procedure If on anti-coagulant drug instructions Last Eliquid will be this am, do not take the evenings dose.

## 2020-02-06 ENCOUNTER — Ambulatory Visit (HOSPITAL_COMMUNITY)
Admission: RE | Admit: 2020-02-06 | Discharge: 2020-02-07 | Disposition: A | Discharge status: Home / Self Care | Payer: PPO | Attending: Internal Medicine | Admitting: Internal Medicine

## 2020-02-06 ENCOUNTER — Ambulatory Visit (HOSPITAL_COMMUNITY)
Admission: RE | Disposition: A | Discharge status: Home / Self Care | Payer: PPO | Source: Home / Self Care | Attending: Internal Medicine

## 2020-02-06 ENCOUNTER — Other Ambulatory Visit: Payer: Self-pay

## 2020-02-06 DIAGNOSIS — F319 Bipolar disorder, unspecified: Secondary | ICD-10-CM | POA: Diagnosis not present

## 2020-02-06 DIAGNOSIS — E7800 Pure hypercholesterolemia, unspecified: Secondary | ICD-10-CM | POA: Diagnosis not present

## 2020-02-06 DIAGNOSIS — G4730 Sleep apnea, unspecified: Secondary | ICD-10-CM | POA: Insufficient documentation

## 2020-02-06 DIAGNOSIS — I4819 Other persistent atrial fibrillation: Secondary | ICD-10-CM | POA: Diagnosis not present

## 2020-02-06 DIAGNOSIS — R7303 Prediabetes: Secondary | ICD-10-CM | POA: Diagnosis not present

## 2020-02-06 DIAGNOSIS — Z79899 Other long term (current) drug therapy: Secondary | ICD-10-CM | POA: Diagnosis not present

## 2020-02-06 DIAGNOSIS — F329 Major depressive disorder, single episode, unspecified: Secondary | ICD-10-CM | POA: Diagnosis not present

## 2020-02-06 DIAGNOSIS — Z6841 Body Mass Index (BMI) 40.0 and over, adult: Secondary | ICD-10-CM | POA: Insufficient documentation

## 2020-02-06 DIAGNOSIS — Z959 Presence of cardiac and vascular implant and graft, unspecified: Secondary | ICD-10-CM

## 2020-02-06 DIAGNOSIS — Z950 Presence of cardiac pacemaker: Secondary | ICD-10-CM

## 2020-02-06 DIAGNOSIS — I495 Sick sinus syndrome: Secondary | ICD-10-CM | POA: Diagnosis not present

## 2020-02-06 DIAGNOSIS — Z7901 Long term (current) use of anticoagulants: Secondary | ICD-10-CM | POA: Insufficient documentation

## 2020-02-06 DIAGNOSIS — E559 Vitamin D deficiency, unspecified: Secondary | ICD-10-CM | POA: Diagnosis not present

## 2020-02-06 DIAGNOSIS — E669 Obesity, unspecified: Secondary | ICD-10-CM | POA: Diagnosis not present

## 2020-02-06 DIAGNOSIS — J45909 Unspecified asthma, uncomplicated: Secondary | ICD-10-CM | POA: Insufficient documentation

## 2020-02-06 DIAGNOSIS — I10 Essential (primary) hypertension: Secondary | ICD-10-CM | POA: Diagnosis not present

## 2020-02-06 HISTORY — DX: Presence of cardiac pacemaker: Z95.0

## 2020-02-06 HISTORY — PX: PACEMAKER IMPLANT: EP1218

## 2020-02-06 SURGERY — PACEMAKER IMPLANT

## 2020-02-06 MED ORDER — CHLORHEXIDINE GLUCONATE 4 % EX LIQD
4.0000 "application " | Freq: Once | CUTANEOUS | Status: DC
  Filled 2020-02-06: qty 60

## 2020-02-06 MED ORDER — HYDROCODONE-ACETAMINOPHEN 5-325 MG PO TABS
1.0000 | ORAL_TABLET | ORAL | Status: DC | PRN
  Administered 2020-02-06: 2 via ORAL
  Administered 2020-02-07: 1 via ORAL
  Filled 2020-02-06: qty 1
  Filled 2020-02-06: qty 2

## 2020-02-06 MED ORDER — FENTANYL CITRATE (PF) 100 MCG/2ML IJ SOLN
INTRAMUSCULAR | Status: DC | PRN
Start: 2020-02-06 — End: 2020-02-06
  Administered 2020-02-06 (×2): 25 ug via INTRAVENOUS

## 2020-02-06 MED ORDER — LIDOCAINE HCL 1 % IJ SOLN
INTRAMUSCULAR | Status: AC
  Filled 2020-02-06: qty 60

## 2020-02-06 MED ORDER — ZOLPIDEM TARTRATE 5 MG PO TABS
5.0000 mg | ORAL_TABLET | Freq: Every evening | ORAL | Status: DC | PRN
  Administered 2020-02-06: 5 mg via ORAL
  Filled 2020-02-06: qty 1

## 2020-02-06 MED ORDER — ALBUTEROL SULFATE (2.5 MG/3ML) 0.083% IN NEBU: 2.5000 mg | INHALATION_SOLUTION | Freq: Four times a day (QID) | RESPIRATORY_TRACT | Status: DC | PRN

## 2020-02-06 MED ORDER — BUPROPION HCL ER (SMOKING DET) 150 MG PO TB12: 300.0000 mg | ORAL_TABLET | Freq: Every morning | ORAL | Status: DC

## 2020-02-06 MED ORDER — BUPROPION HCL ER (SR) 150 MG PO TB12
300.0000 mg | ORAL_TABLET | Freq: Every day | ORAL | Status: DC
  Administered 2020-02-07: 300 mg via ORAL
  Filled 2020-02-06: qty 2

## 2020-02-06 MED ORDER — SODIUM CHLORIDE 0.9 % IV SOLN
80.0000 mg | INTRAVENOUS | Status: AC
  Administered 2020-02-06: 80 mg

## 2020-02-06 MED ORDER — HEPARIN (PORCINE) IN NACL 1000-0.9 UT/500ML-% IV SOLN
INTRAVENOUS | Status: AC
  Filled 2020-02-06: qty 500

## 2020-02-06 MED ORDER — FUROSEMIDE 40 MG PO TABS
40.0000 mg | ORAL_TABLET | Freq: Every day | ORAL | Status: DC
  Filled 2020-02-06: qty 1

## 2020-02-06 MED ORDER — ONDANSETRON HCL 4 MG/2ML IJ SOLN: 4.0000 mg | Freq: Four times a day (QID) | INTRAMUSCULAR | Status: DC | PRN

## 2020-02-06 MED ORDER — DILTIAZEM HCL ER COATED BEADS 120 MG PO CP24
120.0000 mg | ORAL_CAPSULE | Freq: Every day | ORAL | Status: DC
  Administered 2020-02-07: 120 mg via ORAL
  Filled 2020-02-06: qty 1

## 2020-02-06 MED ORDER — LIDOCAINE HCL (PF) 1 % IJ SOLN
INTRAMUSCULAR | Status: DC | PRN
  Administered 2020-02-06: 60 mL

## 2020-02-06 MED ORDER — LIDOCAINE HCL 1 % IJ SOLN
INTRAMUSCULAR | Status: AC
  Filled 2020-02-06: qty 20

## 2020-02-06 MED ORDER — MIDAZOLAM HCL 5 MG/5ML IJ SOLN
INTRAMUSCULAR | Status: DC | PRN
  Administered 2020-02-06 (×3): 2 mg via INTRAVENOUS

## 2020-02-06 MED ORDER — SODIUM CHLORIDE 0.9% FLUSH: 3.0000 mL | INTRAVENOUS | Status: DC | PRN

## 2020-02-06 MED ORDER — MIDAZOLAM HCL 5 MG/5ML IJ SOLN
INTRAMUSCULAR | Status: AC
  Filled 2020-02-06: qty 5

## 2020-02-06 MED ORDER — LISINOPRIL 20 MG PO TABS
20.0000 mg | ORAL_TABLET | Freq: Every day | ORAL | Status: DC
  Administered 2020-02-06 – 2020-02-07 (×2): 20 mg via ORAL
  Filled 2020-02-06 (×2): qty 1

## 2020-02-06 MED ORDER — ACETAMINOPHEN 325 MG PO TABS: 325.0000 mg | ORAL_TABLET | ORAL | Status: DC | PRN

## 2020-02-06 MED ORDER — DOFETILIDE 250 MCG PO CAPS
250.0000 ug | ORAL_CAPSULE | Freq: Two times a day (BID) | ORAL | Status: DC
  Administered 2020-02-06 – 2020-02-07 (×2): 250 ug via ORAL
  Filled 2020-02-06 (×2): qty 1

## 2020-02-06 MED ORDER — FENTANYL CITRATE (PF) 100 MCG/2ML IJ SOLN
INTRAMUSCULAR | Status: AC
  Filled 2020-02-06: qty 2

## 2020-02-06 MED ORDER — SODIUM CHLORIDE 0.9 % IV SOLN: 250.0000 mL | INTRAVENOUS | Status: DC | PRN

## 2020-02-06 MED ORDER — DEXTROSE 5 % IV SOLN
3.0000 g | INTRAVENOUS | Status: AC
  Administered 2020-02-06: 3 g via INTRAVENOUS
  Filled 2020-02-06: qty 3000

## 2020-02-06 MED ORDER — SODIUM CHLORIDE 0.9 % IV SOLN: INTRAVENOUS | Status: DC

## 2020-02-06 MED ORDER — CEFAZOLIN SODIUM-DEXTROSE 1-4 GM/50ML-% IV SOLN
1.0000 g | Freq: Four times a day (QID) | INTRAVENOUS | Status: AC
  Administered 2020-02-06 – 2020-02-07 (×3): 1 g via INTRAVENOUS
  Filled 2020-02-06 (×3): qty 50

## 2020-02-06 MED ORDER — SODIUM CHLORIDE 0.9% FLUSH
3.0000 mL | Freq: Two times a day (BID) | INTRAVENOUS | Status: DC
  Administered 2020-02-06: 3 mL via INTRAVENOUS

## 2020-02-06 MED ORDER — SODIUM CHLORIDE 0.9 % IV SOLN
INTRAVENOUS | Status: AC
  Filled 2020-02-06: qty 2

## 2020-02-06 MED ORDER — HEPARIN (PORCINE) IN NACL 1000-0.9 UT/500ML-% IV SOLN
INTRAVENOUS | Status: DC | PRN
  Administered 2020-02-06: 500 mL

## 2020-02-06 SURGICAL SUPPLY — 8 items
CABLE SURGICAL S-101-97-12 (CABLE) ×2 IMPLANT
LEAD TENDRIL MRI 52CM LPA1200M (Lead) ×1 IMPLANT
LEAD TENDRIL MRI 58CM LPA1200M (Lead) ×1 IMPLANT
PACEMAKER ASSURITY DR-RF (Pacemaker) ×1 IMPLANT
PAD PRO RADIOLUCENT 2001M-C (PAD) ×2 IMPLANT
SHEATH 8FR PRELUDE SNAP 13 (SHEATH) ×2 IMPLANT
SHEATH PROBE COVER 6X72 (BAG) ×1 IMPLANT
TRAY PACEMAKER INSERTION (PACKS) ×2 IMPLANT

## 2020-02-06 NOTE — Discharge Instructions (Signed)
° ° °  Supplemental Discharge Instructions for  Pacemaker/Defibrillator Patients  Activity No heavy lifting or vigorous activity with your left/right arm for 6 to 8 weeks.  Do not raise your left/right arm above your head for one week.  Gradually raise your affected arm as drawn below.              02/10/2020                02/11/2020                02/12/2020                 02/13/2020 __  NO DRIVING for  1 week   ; you may begin driving on  2/0/6015   .  WOUND CARE - Keep the wound area clean and dry.  Do not get this area wet, no showers until cleared to at your wound check visit - The tape/steri-strips on your wound will fall off; do not pull them off.  No bandage is needed on the site.  DO  NOT apply any creams, oils, or ointments to the wound area. - If you notice any drainage or discharge from the wound, any swelling or bruising at the site, or you develop a fever > 101? F after you are discharged home, call the office at once.  Special Instructions - You are still able to use cellular telephones; use the ear opposite the side where you have your pacemaker/defibrillator.  Avoid carrying your cellular phone near your device. - When traveling through airports, show security personnel your identification card to avoid being screened in the metal detectors.  Ask the security personnel to use the hand wand. - Avoid arc welding equipment, MRI testing (magnetic resonance imaging), TENS units (transcutaneous nerve stimulators).  Call the office for questions about other devices. - Avoid electrical appliances that are in poor condition or are not properly grounded. - Microwave ovens are safe to be near or to operate.

## 2020-02-06 NOTE — Progress Notes (Addendum)
Pt instructed on not using her left arm post PPM placement.  Sling put on left arm..  Pt continue to move arm and to use it with cell phone and remote.  Advised pt that the movement can cause her to have to return back to the procedure room due damage to the site.   Pt became frustrated about not being able to move her left arm and not allowed to get up minutes after the procedure.

## 2020-02-06 NOTE — Interval H&P Note (Signed)
History and Physical Interval Note:  02/06/2020 3:56 PM  Mary Mcfarland  has presented today for surgery, with the diagnosis of bradicardia.  The various methods of treatment have been discussed with the patient and family. After consideration of risks, benefits and other options for treatment, the patient has consented to  Procedure(s): PACEMAKER IMPLANT (N/A) as a surgical intervention.  The patient's history has been reviewed, patient examined, no change in status, stable for surgery.  I have reviewed the patient's chart and labs.  Questions were answered to the patient's satisfaction.    The patient has symptomatic sinus bradycardia.  I would therefore recommend pacemaker implantation at this time.  Risks, benefits, alternatives to pacemaker implantation were discussed in detail with the patient today. The patient understands that the risks include but are not limited to bleeding, infection, pneumothorax, perforation, tamponade, vascular damage, renal failure, MI, stroke, death,  and lead dislodgement and wishes to proceed. We will therefore schedule the procedure at the next available time.  We also discussed remote monitoring and its role today.   Thompson Grayer

## 2020-02-06 NOTE — Telephone Encounter (Signed)
Spoke to the patient about how the process works with going home after implant including how long she stays post procedure and tests ran before she is discharged. She verbalized understanding and is okay to proceed with the procedure today.

## 2020-02-07 ENCOUNTER — Ambulatory Visit (HOSPITAL_COMMUNITY): Payer: PPO

## 2020-02-07 ENCOUNTER — Encounter (HOSPITAL_COMMUNITY): Payer: Self-pay | Admitting: Internal Medicine

## 2020-02-07 ENCOUNTER — Telehealth: Payer: Self-pay | Admitting: Cardiology

## 2020-02-07 DIAGNOSIS — R7303 Prediabetes: Secondary | ICD-10-CM | POA: Diagnosis not present

## 2020-02-07 DIAGNOSIS — M47814 Spondylosis without myelopathy or radiculopathy, thoracic region: Secondary | ICD-10-CM | POA: Diagnosis not present

## 2020-02-07 DIAGNOSIS — J45909 Unspecified asthma, uncomplicated: Secondary | ICD-10-CM | POA: Diagnosis not present

## 2020-02-07 DIAGNOSIS — G4730 Sleep apnea, unspecified: Secondary | ICD-10-CM | POA: Diagnosis not present

## 2020-02-07 DIAGNOSIS — I10 Essential (primary) hypertension: Secondary | ICD-10-CM | POA: Diagnosis not present

## 2020-02-07 DIAGNOSIS — Z6841 Body Mass Index (BMI) 40.0 and over, adult: Secondary | ICD-10-CM | POA: Diagnosis not present

## 2020-02-07 DIAGNOSIS — Z950 Presence of cardiac pacemaker: Secondary | ICD-10-CM | POA: Diagnosis not present

## 2020-02-07 DIAGNOSIS — I4819 Other persistent atrial fibrillation: Secondary | ICD-10-CM | POA: Diagnosis not present

## 2020-02-07 DIAGNOSIS — F319 Bipolar disorder, unspecified: Secondary | ICD-10-CM | POA: Diagnosis not present

## 2020-02-07 DIAGNOSIS — E669 Obesity, unspecified: Secondary | ICD-10-CM | POA: Diagnosis not present

## 2020-02-07 DIAGNOSIS — E559 Vitamin D deficiency, unspecified: Secondary | ICD-10-CM | POA: Diagnosis not present

## 2020-02-07 DIAGNOSIS — I495 Sick sinus syndrome: Secondary | ICD-10-CM | POA: Diagnosis not present

## 2020-02-07 DIAGNOSIS — F329 Major depressive disorder, single episode, unspecified: Secondary | ICD-10-CM | POA: Diagnosis not present

## 2020-02-07 DIAGNOSIS — E7800 Pure hypercholesterolemia, unspecified: Secondary | ICD-10-CM | POA: Diagnosis not present

## 2020-02-07 LAB — MAGNESIUM: Magnesium: 1.9 mg/dL (ref 1.7–2.4)

## 2020-02-07 LAB — BASIC METABOLIC PANEL
Anion gap: 6 (ref 5–15)
BUN: 17 mg/dL (ref 8–23)
CO2: 26 mmol/L (ref 22–32)
Calcium: 9.2 mg/dL (ref 8.9–10.3)
Chloride: 106 mmol/L (ref 98–111)
Creatinine, Ser: 0.78 mg/dL (ref 0.44–1.00)
GFR calc Af Amer: >60 mL/min (ref >60)
GFR calc non Af Amer: >60 mL/min (ref >60)
Glucose, Bld: 104 mg/dL — ABNORMAL HIGH (ref 70–99)
Potassium: 4.6 mmol/L (ref 3.5–5.1)
Sodium: 138 mmol/L (ref 135–145)

## 2020-02-07 MED FILL — Lidocaine HCl Local Inj 1%: INTRAMUSCULAR | Qty: 60 | Status: AC

## 2020-02-07 MED FILL — Lidocaine HCl Local Inj 1%: INTRAMUSCULAR | Qty: 20 | Status: AC

## 2020-02-07 NOTE — Plan of Care (Signed)
  Problem: Education: Goal: Knowledge of General Education information will improve Description Including pain rating scale, medication(s)/side effects and non-pharmacologic comfort measures Outcome: Progressing   

## 2020-02-07 NOTE — Progress Notes (Signed)
Doing well s/p PPM implant  CXR reveals stable leads, no ptx  Device interrogation is reviewed and normal  DC to home with routine wound care and follow-up  Resume eliquis on Monday am.  Thompson Grayer MD, [place] 02/07/2020 7:18 AM

## 2020-02-07 NOTE — Telephone Encounter (Signed)
Pt called with pain, but she did not answer on return call.  I left voice message explaining she should take tylenol extra strength for pain and no extreme movements.  If swelling she should call or go to ER.

## 2020-02-07 NOTE — Progress Notes (Signed)
D/C instructions given and reviewed. No questions voiced at this time but encouraged to call if any questions. Awaiting last dose of IV antibiotics.

## 2020-02-07 NOTE — Discharge Summary (Addendum)
ELECTROPHYSIOLOGY PROCEDURE DISCHARGE SUMMARY    Patient ID: CYANI KALLSTROM,  MRN: [id number], DOB/AGE: 62-02-1956 62 y.o.  Admit date: 02/06/2020 Discharge date: 02/07/2020  Primary Care Physician: Lawerance Cruel, MD  Primary Cardiologist: Dr. Harrell Gave Electrophysiologist: Dr. Rayann Heman  Primary Discharge Diagnosis:  1. Tachy-brady syndrome 2. Symptomatic bradycardia  Secondary Discharge Diagnosis:  1. Persistent AFib     CHA2DS2Vasc is 2, on Eliquis, appropriately dosed     Tikosyn, QT stable, lab looks ok     Med list reviewed with no contraindicated meds noted 2. HTN 3. Bipolar disorder 4. obesity  Allergies  Allergen Reactions  . Niacin Hives     Procedures This Admission:  1.  Implantation of a SJM dual chamber PPM on 02/06/2020 by Dr Rayann Heman.  The patient received a [employer] MRI model [id number] (serial number  [id number]) pacemaker, [employer] Tendril MRI model [id number]- 52 (serial number  [id number]) right atrial lead and a [employer] Tendril MRI model [id number]  (serial number  [id number]) right ventricular lead   There were no immediate post procedure complications. 2.  CXR on 02/07/2020 demonstrated no pneumothorax status post device implantation.   Brief HPI: Mary Mcfarland is a 64 y.o. female is followed out patient with PMHx noted above.  Developed symptoms of dizziness, weakness, found with episodes of bradycardia, diagnosed with tachy-brady syndrome and recommended PPM implant. Risks, benefits, and alternatives to PPM implantation were reviewed with the patient who wished to proceed.   Hospital Course:  The patient was admitted and underwent implantation of a PPM with details as outlined above.  She was monitored on telemetry overnight which demonstrated SR with intermittent A pacing.  Left chest was without hematoma or ecchymosis.  The device was interrogated and found to be functioning normally.  CXR was obtained and  demonstrated no pneumothorax status post device implantation.  Wound care, arm mobility, and restrictions were reviewed with the patient.  The patient feels well this morning, no CP or SOB, she was examined by Dr. Rayann Heman and considered stable for discharge to home.    Resume Eliquis Monday 02/10/2020   Physical Exam: Vitals:   02/07/20 0052 02/07/20 0423 02/07/20 0429 02/07/20 0800  BP: 133/71  (!) 123/47 (!) 126/50  Pulse: (!) 48  (!) 48 85  Resp: 18  18 18   Temp: 98.1 F (36.7 C)  98 F (36.7 C) 98.3 F (36.8 C)  TempSrc: Oral  Oral Oral  SpO2: 99%  100% 96%  Weight:  127.2 kg    Height:        GEN- The patient is well appearing, alert and oriented x 3 today.   HEENT: normocephalic, atraumatic; sclera clear, conjunctiva pink; hearing intact; oropharynx clear; neck supple, no JVP Lungs- CTA b/l, normal work of breathing.  No wheezes, rales, rhonchi Heart- RRR, no murmurs, rubs or gallops, PMI not laterally displaced GI- soft, non-tender, non-distende Extremities- no clubbing, cyanosis, or edema MS- no significant deformity or atrophy Skin- warm and dry, no rash or lesion, left chest without hematoma/ecchymosis Psych- euthymic mood, full affect Neuro- no gross deficits   Labs:   Lab Results  Component Value Date   WBC 13.9 (H) 01/21/2020   HGB 13.5 01/21/2020   HCT 41.6 01/21/2020   MCV 89 01/21/2020   PLT 382 01/21/2020    Recent Labs  Lab 02/07/20 0518  NA 138  K 4.6  CL 106  CO2 26  BUN 17  CREATININE 0.78  CALCIUM 9.2  GLUCOSE 104*    Discharge Medications:  Allergies as of 02/07/2020      Reactions   Niacin Hives      Medication List    STOP taking these medications   buPROPion 150 MG 12 hr tablet Commonly known as: ZYBAN     TAKE these medications   albuterol 108 (90 Base) MCG/ACT inhaler Commonly known as: VENTOLIN HFA Inhale 2 puffs into the lungs every 6 (six) hours as needed for wheezing or shortness of breath.   apixaban 5 MG Tabs  tablet Commonly known as: Eliquis Take 1 tablet (5 mg total) by mouth 2 (two) times daily. Notes to patient: Do not resume until Monday, 02/10/2020   atorvastatin 40 MG tablet Commonly known as: LIPITOR Take 1 tablet (40 mg total) by mouth daily.   buPROPion 150 MG 12 hr tablet Commonly known as: WELLBUTRIN SR Take 300 mg by mouth daily.   diltiazem 120 MG 24 hr capsule Commonly known as: Cardizem CD Take 1 capsule (120 mg total) by mouth daily.   dofetilide 250 MCG capsule Commonly known as: TIKOSYN Take 1 capsule (250 mcg total) by mouth 2 (two) times daily.   furosemide 40 MG tablet Commonly known as: LASIX Take 1 tablet (40 mg total) by mouth daily.   furosemide 40 MG tablet Commonly known as: LASIX Take 1 tablet (40 mg total) by mouth daily as needed. In addition to 40 mg daily dose. Take when your weight is up 5 lbs from normal.   ibuprofen 800 MG tablet Commonly known as: ADVIL Take 800 mg by mouth every 8 (eight) hours as needed for moderate pain.   lisinopril 20 MG tablet Commonly known as: ZESTRIL Take 1 tablet by mouth once daily   omeprazole 20 MG capsule Commonly known as: PRILOSEC Take 20 mg by mouth daily before breakfast.   Opcon-A 0.027-0.315 % Soln Generic drug: Naphazoline-Pheniramine Place 1-2 drops into both eyes 3 (three) times daily as needed (for allergies).       Disposition:  home Discharge Instructions    Diet - low sodium heart healthy   Complete by: As directed    Increase activity slowly   Complete by: As directed       Follow-up Information    [place] [facility] Follow up.   Specialty: Cardiology Why: 02/18/2020 @ 3:00PM, wound check visit Contact information: [address], [place]       Thompson Grayer, MD Follow up.   Specialty: Cardiology Why: 05/18/2020 @ 2:30PM Contact information: [place] [address] [phone]          Lawerance Cruel, MD. Go on 02/21/2020.   Specialty: Family Medicine Why: @12 :15pm Contact information: [place] [address] [phone]               Duration of Discharge Encounter: Greater than 30 minutes including physician time.  Venetia Night, PA-C 02/07/2020 8:36 AM

## 2020-02-09 ENCOUNTER — Telehealth: Payer: Self-pay | Admitting: Physician Assistant

## 2020-02-09 NOTE — Telephone Encounter (Signed)
Returned call to patient who states she is having dizziness. She reports normal BP in the 078M systolic and HR 48. In review, her heart rate was 48 on day of discharge 02/07/20. No chest pain, dyspnea, LE swelling. No vomiting. No syncope. She has questions regarding her monitor box and she has tried to call [employer] with no answer.   I advised no driving and will send a message to Dr. Rayann Heman and device clinic to call her tomorrow morning for symptoms and for questions regarding PPM downloads. She expressed understanding of the plan.

## 2020-02-10 ENCOUNTER — Other Ambulatory Visit: Payer: Self-pay

## 2020-02-10 ENCOUNTER — Ambulatory Visit (INDEPENDENT_AMBULATORY_CARE_PROVIDER_SITE_OTHER): Payer: PPO | Admitting: Emergency Medicine

## 2020-02-10 DIAGNOSIS — Z950 Presence of cardiac pacemaker: Secondary | ICD-10-CM

## 2020-02-10 DIAGNOSIS — I495 Sick sinus syndrome: Secondary | ICD-10-CM

## 2020-02-10 LAB — CUP PACEART INCLINIC DEVICE CHECK
Battery Remaining Longevity: 122 mo
Battery Voltage: 3.11 V
Brady Statistic RA Percent Paced: 16 %
Brady Statistic RV Percent Paced: 8.8 %
Date Time Interrogation Session: 20210830173616
Implantable Lead Implant Date: 20210826
Implantable Lead Implant Date: 20210826
Implantable Lead Location: 753859
Implantable Lead Location: 753860
Implantable Pulse Generator Implant Date: 20210826
Lead Channel Impedance Value: 400 Ohm
Lead Channel Impedance Value: 400 Ohm
Lead Channel Impedance Value: 787.5 Ohm
Lead Channel Impedance Value: 787.5 Ohm
Lead Channel Pacing Threshold Amplitude: 0.625 V
Lead Channel Pacing Threshold Amplitude: 1.5 V
Lead Channel Pacing Threshold Pulse Width: 0.4 ms
Lead Channel Pacing Threshold Pulse Width: 0.5 ms
Lead Channel Sensing Intrinsic Amplitude: 3.7 mV
Lead Channel Sensing Intrinsic Amplitude: 8.5 mV
Lead Channel Setting Pacing Amplitude: 0.875
Lead Channel Setting Pacing Amplitude: 3.25 V
Lead Channel Setting Pacing Pulse Width: 0.4 ms
Lead Channel Setting Sensing Sensitivity: 2 mV
Pulse Gen Model: 2272
Pulse Gen Serial Number: 3859348

## 2020-02-10 NOTE — Progress Notes (Signed)
Pacemaker check in clinic, added-on for reprogramming due to symptomatic junctional rhythm noted on transmission. Normal device function. Thresholds, sensing, impedances consistent with previous measurements. RA/RV outputs programmed with auto capture on for appropriate safety margin until 3 months follow-up. No mode switch or high ventricular rates noted. Multiple runs of AT noted with atrial rates below detection (106-108bpm), as well as PMT/BELT 110-120bpm. V-A conduction measures 317ms today. Base rate increased to 70bpm per Dr. Rayann Heman, PMT detection rate reduced to 110bpm, unable to extend PVARP beyond 345ms due to base rate/VIP. Patient education completed. Wound check on 02/18/20 as scheduled. Patient aware to call if any concerns in the interim.

## 2020-02-10 NOTE — Telephone Encounter (Signed)
Transmission received. I told her the nurse will review the transmission and give her a call in a few minutes.

## 2020-02-10 NOTE — Telephone Encounter (Addendum)
Transmission reviewed. Normal PPM function. Presenting EGM appears juctional rhythm 50s. Pt report she feels "super dizzy" this AM. Upset stomach. Still experiencing SOB with exertion, denies any improvement since PPM placed. Denies syncope, SOB at rest, chest discomfort, or other symptoms. ED precautions reviewed. Advised will call back after discussing with Dr. Rayann Heman.  Discussed with Dr. Rayann Heman, plan to increase base rate to 70bpm. Pt accepted DC appointment today at 4:00pm. Advised pt that as she is experiencing dizzy spells, she should have someone else drive her to this appointment. Pt agrees to have her son bring her. Reiterated ED precautions for any worsening symptoms. Pt verbalizes understanding and agreement with plan.

## 2020-02-13 ENCOUNTER — Ambulatory Visit (HOSPITAL_COMMUNITY): Payer: Self-pay | Admitting: Physician Assistant

## 2020-02-14 ENCOUNTER — Telehealth: Payer: Self-pay

## 2020-02-14 NOTE — Telephone Encounter (Signed)
Patient called in she has sent in her transmission if you can give her a call she is still having SOB and is concerned

## 2020-02-14 NOTE — Telephone Encounter (Signed)
PPM transmission reviewed. AP 42%, VP 40%. No VT/VF episodes or true AT/AF episodes (all appear oversensing of FFRWs). RA/RV sensing and impedance trends stable. Autocapture trends for RA and RV leads and 14 stored AMS episode EGMs suggest intermittent difficulty with running autocapture test and obtaining RA/RV auto thresholds (see example EGM below). Intermittent junctional beats noted on some EGMs. Last measured RA threshold was 1.5V @ 0.17ms on 9/3, last measured RV threshold was 0.75V @ 0.54ms on 9/3. Presenting rhythm as of 9/3 at 15:43 shows AS/VS 80s, intermittent AP/VS events.   Spoke with pt. She reports she is still experiencing episodes of lightheadedness, as well as SOB when climbing the stairs to her apartment. She reports that she does not feel like she is going to pass out with the lightheadedness. Reports she has actually been feeling fair yesterday and today. States that she has not felt "normal" in so long that she does not know what it would feel like. Has a friend staying with her and driving her around while she recovers from [facility] placement. Scheduled for PPM wound check on 02/18/20 and f/u with PCP on 02/21/20.  Reviewed/discussed transmission with [name] and [name], [employer] representatives. Received recommendation to turn RA/RV autocapture off and fix outputs. Offered DC appointment today, but pt declines as she just arrived at her daughter's house. Pt is aware to keep wound check on 02/18/20. [employer] rep will also be at appointment. ED precautions reviewed for any new or worsening symptoms, including syncope, over the weekend. Pt verbalized understanding of all information and instructions. She denies any additional questions or concerns at this time.

## 2020-02-18 ENCOUNTER — Other Ambulatory Visit: Payer: Self-pay

## 2020-02-18 ENCOUNTER — Ambulatory Visit (INDEPENDENT_AMBULATORY_CARE_PROVIDER_SITE_OTHER): Payer: PPO | Admitting: Emergency Medicine

## 2020-02-18 DIAGNOSIS — I495 Sick sinus syndrome: Secondary | ICD-10-CM | POA: Diagnosis not present

## 2020-02-19 ENCOUNTER — Other Ambulatory Visit: Payer: Self-pay | Admitting: Cardiology

## 2020-02-19 DIAGNOSIS — R600 Localized edema: Secondary | ICD-10-CM

## 2020-02-19 LAB — CUP PACEART INCLINIC DEVICE CHECK
Battery Remaining Longevity: 81 mo
Battery Voltage: 3.11 V
Brady Statistic RA Percent Paced: 40 %
Brady Statistic RV Percent Paced: 38 %
Date Time Interrogation Session: 20210907150800
Implantable Lead Implant Date: 20210826
Implantable Lead Implant Date: 20210826
Implantable Lead Location: 753859
Implantable Lead Location: 753860
Implantable Pulse Generator Implant Date: 20210826
Lead Channel Impedance Value: 412.5 Ohm
Lead Channel Impedance Value: 762.5 Ohm
Lead Channel Pacing Threshold Amplitude: 0.75 V
Lead Channel Pacing Threshold Amplitude: 1 V
Lead Channel Pacing Threshold Pulse Width: 0.4 ms
Lead Channel Pacing Threshold Pulse Width: 0.5 ms
Lead Channel Sensing Intrinsic Amplitude: 10 mV
Lead Channel Sensing Intrinsic Amplitude: 3.1 mV
Lead Channel Setting Pacing Amplitude: 3.5 V
Lead Channel Setting Pacing Amplitude: 3.5 V
Lead Channel Setting Pacing Pulse Width: 0.4 ms
Lead Channel Setting Sensing Sensitivity: 2 mV
Pulse Gen Model: 2272
Pulse Gen Serial Number: 3859348

## 2020-02-19 MED ORDER — FUROSEMIDE 40 MG PO TABS: ORAL_TABLET | ORAL | 3 refills | Status: DC

## 2020-02-19 NOTE — Progress Notes (Signed)
Wound check appointment. Steri-strips removed. Wound without redness or edema. Incision edges approximated, wound well healed. Thresholds, sensing, and impedances consistent with implant measurements. Device programmed at 3.5V/ programmed for extra safety margin until 3 month visit. Autocapture programmed off  due to inaccurate threshold measurements. Episodes of PMT noted with EGMs to confirm, PVARP extended from 300 ms to 375 ms with rate response programmed on. Industry present for all changes. Histogram distribution appropriate for patient and level of activity. No high ventricular rates noted. Patient educated about wound care, arm mobility, lifting restrictions. ROV with Dr Lovena Le 05/18/20. Patient enrolled in remote follow-up and next remote scheduled for 05/11/20.

## 2020-02-21 DIAGNOSIS — R0609 Other forms of dyspnea: Secondary | ICD-10-CM | POA: Diagnosis not present

## 2020-02-21 DIAGNOSIS — G4700 Insomnia, unspecified: Secondary | ICD-10-CM | POA: Diagnosis not present

## 2020-02-25 ENCOUNTER — Encounter: Payer: Self-pay | Admitting: Cardiology

## 2020-02-25 ENCOUNTER — Ambulatory Visit: Payer: HMO | Admitting: Medical

## 2020-02-25 ENCOUNTER — Telehealth (HOSPITAL_COMMUNITY): Payer: Self-pay | Admitting: *Deleted

## 2020-02-25 ENCOUNTER — Ambulatory Visit: Payer: PPO | Admitting: Cardiology

## 2020-02-25 ENCOUNTER — Other Ambulatory Visit: Payer: Self-pay

## 2020-02-25 VITALS — HR 70 | Temp 95.0°F | Ht 66.0 in | Wt 275.8 lb

## 2020-02-25 DIAGNOSIS — Z950 Presence of cardiac pacemaker: Secondary | ICD-10-CM | POA: Diagnosis not present

## 2020-02-25 DIAGNOSIS — R0609 Other forms of dyspnea: Secondary | ICD-10-CM

## 2020-02-25 DIAGNOSIS — R0600 Dyspnea, unspecified: Secondary | ICD-10-CM | POA: Diagnosis not present

## 2020-02-25 DIAGNOSIS — I480 Paroxysmal atrial fibrillation: Secondary | ICD-10-CM

## 2020-02-25 DIAGNOSIS — R5383 Other fatigue: Secondary | ICD-10-CM | POA: Diagnosis not present

## 2020-02-25 DIAGNOSIS — R600 Localized edema: Secondary | ICD-10-CM

## 2020-02-25 MED ORDER — FUROSEMIDE 40 MG PO TABS: 40.0000 mg | ORAL_TABLET | Freq: Two times a day (BID) | ORAL | 3 refills | Status: DC

## 2020-02-25 NOTE — Patient Instructions (Addendum)
Medication Instructions:  Lasix 40 mg -Take 1 tablet (40 mg total) by mouth 2 (two) times daily. Take afternoon dose only as needed.  *If you need a refill on your cardiac medications before your next appointment, please call your pharmacy*   Lab Work: None ordered   Testing/Procedures: Your physician has requested that you have a 2 day lexiscan myoview. For further information please visit [email or web address]. Please follow instruction sheet, as given. [address]   Follow-Up: At [facility], you and your health needs are our priority.  As part of our continuing mission to provide you with exceptional heart care, we have created designated Provider Care Teams.  These Care Teams include your primary Cardiologist (physician) and Advanced Practice Providers (APPs -  Physician Assistants and Nurse Practitioners) who all work together to provide you with the care you need, when you need it.  We recommend signing up for the patient portal called "MyChart".  Sign up information is provided on this After Visit Summary.  MyChart is used to connect with patients for Virtual Visits (Telemedicine).  Patients are able to view lab/test results, encounter notes, upcoming appointments, etc.  Non-urgent messages can be sent to your provider as well.   To learn more about what you can do with MyChart, go to [email or web address].    Your next appointment:   6 week(s)  The format for your next appointment:   In Person  Provider:   Buford Dresser, MD  [facility]: [phone]   You are scheduled for a Myocardial Perfusion Imaging Study on  Please arrive 15 minutes prior to your appointment time for registration and insurance purposes.  The test will take approximately 3 to 4 hours to complete; you may bring reading material.  If someone comes with you to your appointment, they will need to remain in the main lobby due to limited  space in the testing area. **If you are pregnant or breastfeeding, please notify the nuclear lab prior to your appointment**  How to prepare for your Myocardial Perfusion Test: . Do not eat or drink 3 hours prior to your test, except you may have water. . Do not consume products containing caffeine (regular or decaffeinated) 12 hours prior to your test. (ex: coffee, chocolate, sodas, tea). . Do bring a list of your current medications with you.  If not listed below, you may take your medications as normal. . Do wear comfortable clothes (no dresses or overalls) and walking shoes, tennis shoes preferred (No heels or open toe shoes are allowed). . Do NOT wear cologne, perfume, aftershave, or lotions (deodorant is allowed). . If these instructions are not followed, your test will have to be rescheduled.  Please report to [address] for your test.  If you have questions or concerns about your appointment, you can call the Nuclear Lab at [phone].  If you cannot keep your appointment, please provide 24 hours notification to the Nuclear Lab, to avoid a possible $50 charge to your account.

## 2020-02-25 NOTE — Progress Notes (Signed)
Cardiology Office Note:    Date:  02/25/2020   ID:  Mary Mcfarland, DOB 05-11-1956, MRN [id number]  PCP:  Lawerance Cruel, MD  Cardiologist:  Buford Dresser, MD  EP: Dr. Rayann Heman  Referring MD: Lawerance Cruel, MD   CC: urgent follow up  History of Present Illness:    Mary Mcfarland is a 64 y.o. female with a hx of atrial fibrillation, sinus pauses now s/p PPM, hypertension, hyperlipidemia, obesity who is seen for follow up today. I initially met her 12/30/19.  Today:  Seen for an urgent follow up at patient request. She thought her appt was yesterday, came in, and had a "meltdown." She is tearful in the office today. Cannot do anything without being short of breath. Gets out of breath just walking the aisles while shopped. She notes that she is bipolar, looking for a new psychiatrist.   Had PPM placed 02/06/20. Does not feel significantly improved. Continues to feel dizzy, intermittent headaches. No syncope. Has stumbled but no falls.   Denies chest pain, shortness of breath at rest. No PND, orthopnea, change in chronic LE edema or unexpected weight gain. No syncope, rare palpitations but no fast heart rates.  Past Medical History:  Diagnosis Date  . Asthma   . Atrial fibrillation ([place])   . Back pain   . Bipolar disorder ([place])   . Constipation   . Depression   . Difficult intubation    Narrow airway  . Heartburn   . High cholesterol   . Hypertension   . Longstanding persistent atrial fibrillation ([place])   . Palpitations   . Pinched vertebral nerve    sciatica  . Prediabetes   . Sleep apnea    sleep study was "inconclusive" per patient  . SOB (shortness of breath)   . Swelling of both lower extremities   . Visit for monitoring Tikosyn therapy 08/27/2019  . Vitamin D deficiency     Past Surgical History:  Procedure Laterality Date  . bone spur surgery    . CARDIOVERSION N/A 07/24/2019   Procedure: CARDIOVERSION;  Surgeon: Buford Dresser, MD;  Location: [facility] ENDOSCOPY;  Service: Cardiovascular;  Laterality: N/A;  . COLONOSCOPY    . FOOT SURGERY  2017  . HEEL SPUR RESECTION Left 03/15/2016   Procedure: ACHILLIES TENDON REPAIR AND PUMP BUMP EXCISION;  Surgeon: Melrose Nakayama, MD;  Location: [facility];  Service: Orthopedics;  Laterality: Left;  . PACEMAKER IMPLANT N/A 02/06/2020   Procedure: PACEMAKER IMPLANT;  Surgeon: Thompson Grayer, MD;  Location: [facility] CV LAB;  Service: Cardiovascular;  Laterality: N/A;  . SHOULDER SURGERY  2010  . TOE SURGERY  2011  . TONSILLECTOMY      Current Medications: Current Outpatient Medications on File Prior to Visit  Medication Sig  . albuterol (PROVENTIL HFA;VENTOLIN HFA) 108 (90 Base) MCG/ACT inhaler Inhale 2 puffs into the lungs every 6 (six) hours as needed for wheezing or shortness of breath.  Marland Kitchen apixaban (ELIQUIS) 5 MG TABS tablet Take 1 tablet (5 mg total) by mouth 2 (two) times daily.  Marland Kitchen atorvastatin (LIPITOR) 40 MG tablet Take 1 tablet (40 mg total) by mouth daily.  Marland Kitchen buPROPion (WELLBUTRIN SR) 150 MG 12 hr tablet Take 300 mg by mouth daily.  Marland Kitchen diltiazem (CARDIZEM CD) 120 MG 24 hr capsule Take 1 capsule (120 mg total) by mouth daily.  Marland Kitchen dofetilide (TIKOSYN) 250 MCG capsule Take 1 capsule (250 mcg total) by mouth 2 (two) times daily.  . furosemide (  LASIX) 40 MG tablet Take 1 tablet (40 mg total) by mouth daily. May also take 1 tablet (40 mg total) daily as needed (weight up 5 lbs from baseline).  Marland Kitchen ibuprofen (ADVIL) 800 MG tablet Take 800 mg by mouth every 8 (eight) hours as needed for moderate pain.   Marland Kitchen lisinopril (ZESTRIL) 20 MG tablet Take 1 tablet by mouth once daily (Patient taking differently: Take 20 mg by mouth daily. )  . Naphazoline-Pheniramine (OPCON-A) 0.027-0.315 % SOLN Place 1-2 drops into both eyes 3 (three) times daily as needed (for allergies).   Marland Kitchen omeprazole (PRILOSEC) 20 MG capsule Take 20 mg by mouth daily before breakfast.    No current facility-administered  medications on file prior to visit.     Allergies:   Niacin   Social History   Tobacco Use  . Smoking status: Former Smoker    Packs/day: 1.00    Types: Cigarettes    Quit date: 02/13/1996    Years since quitting: 24.0  . Smokeless tobacco: Never Used  Vaping Use  . Vaping Use: Never used  Substance Use Topics  . Alcohol use: Yes    Alcohol/week: 3.0 - 4.0 standard drinks    Types: 2 Shots of liquor, 1 - 2 Standard drinks or equivalent per week    Comment: occassionally  . Drug use: Never    Family History: family history includes Alcoholism in her father; Bipolar disorder in her mother; CVA in her mother; Cancer in her mother; Depression in her mother; Diabetes in her father; Drug abuse in her father; Heart attack in her father; Heart disease in her father and mother; Hypertension in her father and mother; Liver disease in her father; Obesity in her father and mother; Stroke in her mother.  ROS:   Please see the history of present illness.  Additional pertinent ROS otherwise unremarkable.  EKGs/Labs/Other Studies Reviewed:    The following studies were reviewed today: -Echo 06/2019: EF 60-65%, indeterminate DF, no significant valve disease. LA diameter 4.5 cm -Ca score 06/2019: Ca score 22. Reviewed what this means -attempted CT coronary 12/18/19, highly variable heart rate, test cancelled.  EKG:  EKG is personally reviewed.  The ekg ordered today demonstrates atrial paced rhythm, RBBB, 70 bpm  Recent Labs: 10/03/2019: B Natriuretic Peptide 208.7 01/14/2020: ALT 22; TSH 3.610 01/21/2020: Hemoglobin 13.5; Platelets 382 02/07/2020: BUN 17; Creatinine, Ser 0.78; Magnesium 1.9; Potassium 4.6; Sodium 138  Recent Lipid Panel    Component Value Date/Time   CHOL 168 01/14/2020 1212   TRIG 78 01/14/2020 1212   HDL 61 01/14/2020 1212   CHOLHDL 3.1 11/13/2019 0843   LDLCALC 92 01/14/2020 1212    Physical Exam:    VS:  Pulse 70   Temp (!) 95 F (35 C)   Ht _0  (1.676 m)   Wt  275 lb 12.8 oz (125.1 kg)   LMP 09/24/2013   BMI 44.52 kg/m     Orthostatics: Lying 113/67, HR 70 Sitting 105/64, HR 71 Standing 110/64, HR 70 Standing 3 min 112/52, HR 70  Wt Readings from Last 3 Encounters:  02/25/20 275 lb 12.8 oz (125.1 kg)  02/07/20 280 lb 6.4 oz (127.2 kg)  01/30/20 270 lb (122.5 kg)    GEN: Well nourished, well developed in no acute distress HEENT: Normal, moist mucous membranes NECK: No JVD CARDIAC: regular rhythm, normal S1 and S2, no rubs or gallops. No murmur. VASCULAR: Radial and DP pulses 2+ bilaterally. No carotid bruits RESPIRATORY:  Clear to  auscultation without rales, wheezing or rhonchi  ABDOMEN: Soft, non-tender, non-distended MUSCULOSKELETAL:  Ambulates independently SKIN: Warm and dry, trace bilateral LE edema NEUROLOGIC:  Alert and oriented x 3. No focal neuro deficits noted. PSYCHIATRIC:  Normal affect   ASSESSMENT:    1. DOE (dyspnea on exertion)   2. Cardiac pacemaker in situ   3. Lower leg edema   4. Paroxysmal atrial fibrillation (HCC)   5. Fatigue, unspecified type    PLAN:    Fatigue, dyspnea on exertion, lower extremity edema: -discussed options at length today. -she has had recent echo, monitor, and now pacemaker -will proceed with ischemic evaluation --discussed treadmill stress, nuclear stress/lexiscan, and CT coronary angiography. Discussed pros and cons of each, including but not limited to false positive/false negative risk, radiation risk, and risk of IV contrast dye. Based on shared decision making, decision was made to pursue 2 day nuclear chemical stress test -attempted to reorder furosemide dose today (has had issues with having it filled properly)  Atrial fibrillation, paroxysmal: -symptoms line up with persistent afib, now paroxysmal on dofetilide -CHA2DS2/VAS Stroke Risk Points= 2 -continue apixaban -now s/p PPM to avoid conversion pauses  Hypertension: -well controlled to borderline low today -not  orthostatic -continue lisinopril, furosemide, diltiazem for now. May need to cut back if BP remains low  Morbid obesity: -we discussed that weight loss is a long term goal. Will first exclude high risk cardiovascular issues before recommending exercise regimen  Possible sleep apnea: -had sleep test, awaiting next steps  Hyperlipidemia: -continue atorvastatin  Cardiac risk counseling and prevention recommendations: -recommend heart healthy/Mediterranean diet, with whole grains, fruits, vegetable, fish, lean meats, nuts, and olive oil. Limit salt. -recommend moderate walking, 3-5 times/week for 30-50 minutes each session. Aim for at least 150 minutes.week. Goal should be pace of 3 miles/hours, or walking 1.5 miles in 30 minutes -recommend avoidance of tobacco products. Avoid excess alcohol. -ASCVD risk score: The 10-year ASCVD risk score Mikey Bussing DC Brooke Bonito., et al., 2013) is: 3.1%   Values used to calculate the score:     Age: 62 years     Sex: Female     Is Non-Hispanic African American: No     Diabetic: No     Tobacco smoker: No     Systolic Blood Pressure: 485 mmHg     Is BP treated: Yes     HDL Cholesterol: 61 mg/dL     Total Cholesterol: 168 mg/dL    Plan for follow up: 6 weeks  Buford Dresser, MD, PhD [place]  [facility]    Medication Adjustments/Labs and Tests Ordered: Current medicines are reviewed at length with the patient today.  Concerns regarding medicines are outlined above.  Orders Placed This Encounter  Procedures  . MYOCARDIAL PERFUSION IMAGING  . EKG 12-Lead   Meds ordered this encounter  Medications  . furosemide (LASIX) 40 MG tablet    Sig: Take 1 tablet (40 mg total) by mouth 2 (two) times daily. Take afternoon dose only as needed.    Dispense:  180 tablet    Refill:  3    Patient Instructions  Medication Instructions:  Lasix 40 mg -Take 1 tablet (40 mg total) by mouth 2 (two) times daily. Take afternoon dose only as needed.  *If you  need a refill on your cardiac medications before your next appointment, please call your pharmacy*   Lab Work: None ordered   Testing/Procedures: Your physician has requested that you have a 2 day lexiscan myoview. For further  information please visit [email or web address]. Please follow instruction sheet, as given. [place] [address]   Follow-Up: At [facility], you and your health needs are our priority.  As part of our continuing mission to provide you with exceptional heart care, we have created designated Provider Care Teams.  These Care Teams include your primary Cardiologist (physician) and Advanced Practice Providers (APPs -  Physician Assistants and Nurse Practitioners) who all work together to provide you with the care you need, when you need it.  We recommend signing up for the patient portal called "MyChart".  Sign up information is provided on this After Visit Summary.  MyChart is used to connect with patients for Virtual Visits (Telemedicine).  Patients are able to view lab/test results, encounter notes, upcoming appointments, etc.  Non-urgent messages can be sent to your provider as well.   To learn more about what you can do with MyChart, go to [email or web address].    Your next appointment:   6 week(s)  The format for your next appointment:   In Person  Provider:   Buford Dresser, MD  [facility]: [phone]   You are scheduled for a Myocardial Perfusion Imaging Study on  Please arrive 15 minutes prior to your appointment time for registration and insurance purposes.  The test will take approximately 3 to 4 hours to complete; you may bring reading material.  If someone comes with you to your appointment, they will need to remain in the main lobby due to limited space in the testing area. **If you are pregnant or breastfeeding, please notify the nuclear lab prior to your appointment**  How to prepare for  your Myocardial Perfusion Test: . Do not eat or drink 3 hours prior to your test, except you may have water. . Do not consume products containing caffeine (regular or decaffeinated) 12 hours prior to your test. (ex: coffee, chocolate, sodas, tea). . Do bring a list of your current medications with you.  If not listed below, you may take your medications as normal. . Do wear comfortable clothes (no dresses or overalls) and walking shoes, tennis shoes preferred (No heels or open toe shoes are allowed). . Do NOT wear cologne, perfume, aftershave, or lotions (deodorant is allowed). . If these instructions are not followed, your test will have to be rescheduled.  Please report to [address] for your test.  If you have questions or concerns about your appointment, you can call the Nuclear Lab at [phone].  If you cannot keep your appointment, please provide 24 hours notification to the Nuclear Lab, to avoid a possible $50 charge to your account.    Signed, Buford Dresser, MD PhD 02/25/2020     [place]

## 2020-02-25 NOTE — Telephone Encounter (Signed)
Patient given detailed instructions per Myocardial Perfusion Study Information Sheet for the test on 02/27/20. Patient notified to arrive 15 minutes early and that it is imperative to arrive on time for appointment to keep from having the test rescheduled.  If you need to cancel or reschedule your appointment, please call the office within 24 hours of your appointment. . Patient verbalized understanding. Kirstie Peri

## 2020-02-26 ENCOUNTER — Telehealth (HOSPITAL_COMMUNITY): Payer: Self-pay

## 2020-02-26 ENCOUNTER — Encounter (HOSPITAL_COMMUNITY): Payer: PPO

## 2020-02-26 ENCOUNTER — Telehealth: Payer: Self-pay | Admitting: Cardiology

## 2020-02-26 NOTE — Telephone Encounter (Signed)
Patient is calling regarding instructions for her stress test (02/27/20-02/28/20). She would like to know why she is unable to wear open-toe shoes for Lexiscan if it will not be a treadmill test. Please advise.

## 2020-02-26 NOTE — Telephone Encounter (Signed)
Pt informed that she is ok to wear open toes shoes for Lexiscan per [facility] department. Pt verbalized understanding.

## 2020-02-27 ENCOUNTER — Ambulatory Visit (HOSPITAL_COMMUNITY): Payer: PPO | Attending: Cardiology

## 2020-02-27 ENCOUNTER — Other Ambulatory Visit: Payer: Self-pay

## 2020-02-27 ENCOUNTER — Encounter: Payer: Self-pay | Admitting: Cardiology

## 2020-02-27 DIAGNOSIS — R0609 Other forms of dyspnea: Secondary | ICD-10-CM

## 2020-02-27 DIAGNOSIS — R0600 Dyspnea, unspecified: Secondary | ICD-10-CM | POA: Diagnosis not present

## 2020-02-27 MED ORDER — REGADENOSON 0.4 MG/5ML IV SOLN
0.4000 mg | Freq: Once | INTRAVENOUS | Status: AC
  Administered 2020-02-27: 0.4 mg via INTRAVENOUS

## 2020-02-27 MED ORDER — TECHNETIUM TC 99M TETROFOSMIN IV KIT
32.3000 | PACK | Freq: Once | INTRAVENOUS | Status: AC | PRN
  Administered 2020-02-27: 32.3 via INTRAVENOUS
  Filled 2020-02-27: qty 33

## 2020-02-28 ENCOUNTER — Ambulatory Visit (HOSPITAL_COMMUNITY): Payer: PPO | Attending: Internal Medicine

## 2020-02-28 LAB — MYOCARDIAL PERFUSION IMAGING
LV dias vol: 86 mL (ref 46–106)
LV sys vol: 28 mL
Peak HR: 83 {beats}/min
Rest HR: 71 {beats}/min
SDS: 0
SRS: 0
SSS: 0
TID: 1.2

## 2020-02-28 MED ORDER — TECHNETIUM TC 99M TETROFOSMIN IV KIT
31.8000 | PACK | Freq: Once | INTRAVENOUS | Status: AC | PRN
  Administered 2020-02-28: 31.8 via INTRAVENOUS
  Filled 2020-02-28: qty 32

## 2020-03-04 DIAGNOSIS — Z20822 Contact with and (suspected) exposure to covid-19: Secondary | ICD-10-CM | POA: Diagnosis not present

## 2020-03-16 DIAGNOSIS — R42 Dizziness and giddiness: Secondary | ICD-10-CM | POA: Diagnosis not present

## 2020-03-17 ENCOUNTER — Other Ambulatory Visit: Payer: Self-pay

## 2020-03-17 ENCOUNTER — Encounter: Payer: Self-pay | Admitting: Physical Therapy

## 2020-03-17 ENCOUNTER — Ambulatory Visit: Payer: PPO | Attending: Family Medicine | Admitting: Physical Therapy

## 2020-03-17 DIAGNOSIS — H8112 Benign paroxysmal vertigo, left ear: Secondary | ICD-10-CM | POA: Insufficient documentation

## 2020-03-17 NOTE — Patient Instructions (Signed)
How to Perform the Epley Maneuver The Epley maneuver is an exercise that relieves symptoms of vertigo. Vertigo is the feeling that you or your surroundings are moving when they are not. When you feel vertigo, you may feel like the room is spinning and have trouble walking. Dizziness is a little different than vertigo. When you are dizzy, you may feel unsteady or light-headed. You can do this maneuver at home whenever you have symptoms of vertigo. You can do it up to 3 times a day until your symptoms go away. Even though the Epley maneuver may relieve your vertigo for a few weeks, it is possible that your symptoms will return. This maneuver relieves vertigo, but it does not relieve dizziness. What are the risks? If it is done correctly, the Epley maneuver is considered safe. Sometimes it can lead to dizziness or nausea that goes away after a short time. If you develop other symptoms, such as changes in vision, weakness, or numbness, stop doing the maneuver and call your health care provider. How to perform the Epley maneuver 1. Sit on the edge of a bed or table with your back straight and your legs extended or hanging over the edge of the bed or table. 2. Turn your head halfway toward the affected ear or side. 3. Lie backward quickly with your head turned until you are lying flat on your back. You may want to position a pillow under your shoulders. 4. Hold this position for 30 seconds. You may experience an attack of vertigo. This is normal. 5. Turn your head to the opposite direction until your unaffected ear is facing the floor. 6. Hold this position for 30 seconds. You may experience an attack of vertigo. This is normal. Hold this position until the vertigo stops. 7. Turn your whole body to the same side as your head. Hold for another 30 seconds. 8. Sit back up. You can repeat this exercise up to 3 times a day. Follow these instructions at home:  After doing the Epley maneuver, you can return to  your normal activities.  Ask your health care provider if there is anything you should do at home to prevent vertigo. He or she may recommend that you: ? Keep your head raised (elevated) with two or more pillows while you sleep. ? Do not sleep on the side of your affected ear. ? Get up slowly from bed. ? Avoid sudden movements during the day. ? Avoid extreme head movement, like looking up or bending over. Contact a health care provider if:  Your vertigo gets worse.  You have other symptoms, including: ? Nausea. ? Vomiting. ? Headache. Get help right away if:  You have vision changes.  You have a severe or worsening headache or neck pain.  You cannot stop vomiting.  You have new numbness or weakness in any part of your body. Summary  Vertigo is the feeling that you or your surroundings are moving when they are not.  The Epley maneuver is an exercise that relieves symptoms of vertigo.  If the Epley maneuver is done correctly, it is considered safe. You can do it up to 3 times a day. This information is not intended to replace advice given to you by your health care provider. Make sure you discuss any questions you have with your health care provider. Document Revised: 05/12/2017 Document Reviewed: 04/19/2016 Elsevier Patient Education  [place] to Side-Lying - Brandt-Daroff exercises    Sit on edge  of bed. 1. Turn head 45 to right. 2. Maintain head position and lie down slowly on left side. Hold until symptoms subside. 3. Sit up slowly. Hold until symptoms subside. 4. Turn head 45 to left. 5. Maintain head position and lie down slowly on right side. Hold until symptoms subside. 6. Sit up slowly. Repeat sequence __5__ times per session. Do __3-5__ sessions per day.   Benign Positional Vertigo Vertigo is the feeling that you or your surroundings are moving when they are not. Benign positional vertigo is the most common form of vertigo. This is usually a  harmless condition (benign). This condition is positional. This means that symptoms are triggered by certain movements and positions. This condition can be dangerous if it occurs while you are doing something that could cause harm to you or others. This includes activities such as driving or operating machinery. What are the causes? In many cases, the cause of this condition is not known. It may be caused by a disturbance in an area of the inner ear that helps your brain to sense movement and balance. This disturbance can be caused by:  Viral infection (labyrinthitis).  Head injury.  Repetitive motion, such as jumping, dancing, or running. What increases the risk? You are more likely to develop this condition if:  You are a woman.  You are 42 years of age or older. What are the signs or symptoms? Symptoms of this condition usually happen when you move your head or your eyes in different directions. Symptoms may start suddenly, and usually last for less than a minute. They include:  Loss of balance and falling.  Feeling like you are spinning or moving.  Feeling like your surroundings are spinning or moving.  Nausea and vomiting.  Blurred vision.  Dizziness.  Involuntary eye movement (nystagmus). Symptoms can be mild and cause only minor problems, or they can be severe and interfere with daily life. Episodes of benign positional vertigo may return (recur) over time. Symptoms may improve over time. How is this diagnosed? This condition may be diagnosed based on:  Your medical history.  Physical exam of the head, neck, and ears.  Tests, such as: ? MRI. ? CT scan. ? Eye movement tests. Your health care provider may ask you to change positions quickly while he or she watches you for symptoms of benign positional vertigo, such as nystagmus. Eye movement may be tested with a variety of exams that are designed to evaluate or stimulate vertigo. ? An electroencephalogram (EEG). This  records electrical activity in your brain. ? Hearing tests. You may be referred to a health care provider who specializes in ear, nose, and throat (ENT) problems (otolaryngologist) or a provider who specializes in disorders of the nervous system (neurologist). How is this treated?  This condition may be treated in a session in which your health care provider moves your head in specific positions to adjust your inner ear back to normal. Treatment for this condition may take several sessions. Surgery may be needed in severe cases, but this is rare. In some cases, benign positional vertigo may resolve on its own in 2-4 weeks. Follow these instructions at home: Safety  Move slowly. Avoid sudden body or head movements or certain positions, as told by your health care provider.  Avoid driving until your health care provider says it is safe for you to do so.  Avoid operating heavy machinery until your health care provider says it is safe for you to do so.  Avoid doing any tasks that would be dangerous to you or others if vertigo occurs.  If you have trouble walking or keeping your balance, try using a cane for stability. If you feel dizzy or unstable, sit down right away.  Return to your normal activities as told by your health care provider. Ask your health care provider what activities are safe for you. General instructions  Take over-the-counter and prescription medicines only as told by your health care provider.  Drink enough fluid to keep your urine pale yellow.  Keep all follow-up visits as told by your health care provider. This is important. Contact a health care provider if:  You have a fever.  Your condition gets worse or you develop new symptoms.  Your family or friends notice any behavioral changes.  You have nausea or vomiting that gets worse.  You have numbness or a "pins and needles" sensation. Get help right away if you:  Have difficulty speaking or moving.  Are  always dizzy.  Faint.  Develop severe headaches.  Have weakness in your legs or arms.  Have changes in your hearing or vision.  Develop a stiff neck.  Develop sensitivity to light. Summary  Vertigo is the feeling that you or your surroundings are moving when they are not. Benign positional vertigo is the most common form of vertigo.  The cause of this condition is not known. It may be caused by a disturbance in an area of the inner ear that helps your brain to sense movement and balance.  Symptoms include loss of balance and falling, feeling that you or your surroundings are moving, nausea and vomiting, and blurred vision.  This condition can be diagnosed based on symptoms, physical exam, and other tests, such as MRI, CT scan, eye movement tests, and hearing tests.  Follow safety instructions as told by your health care provider. You will also be told when to contact your health care provider in case of problems. This information is not intended to replace advice given to you by your health care provider. Make sure you discuss any questions you have with your health care provider. Document Revised: 11/08/2017 Document Reviewed: 11/08/2017 Elsevier Patient Education  [place] for Left Posterior / Anterior Canalithiasis -- EPLEY maneuver     Sitting on bed: 1. Turn head 45 left. (a) Lie back slowly, shoulders on pillow, head on bed. (b) Hold _20___ seconds. 2. Keeping head on bed, turn head 90 right. Hold _20___ seconds. 3. Roll to right, head on 45 angle down toward bed. Hold __20__ seconds. 4. Sit up on right side of bed. Repeat __3__ times per session. Do _2___ sessions per day.  Copyright  VHI. All rights reserved.

## 2020-03-18 NOTE — Therapy (Signed)
[facility] [address] [place], [place], [address] Phone: [phone]   Fax:  [phone]  Physical Therapy Evaluation  Patient Details  Name: Mary Mcfarland MRN: [id number] Date of Birth: [birth year] Referring Provider (PT): Dr. Melinda Crutch   Encounter Date: 03/17/2020   PT End of Session - 03/18/20 1932    Visit Number 1    Number of Visits 1    Authorization Type [employer]    Authorization Time Period 03-17-20 - 05-17-20    PT Start Time 1534    PT Stop Time 1615    PT Time Calculation (min) 41 min    Activity Tolerance Patient tolerated treatment well    Behavior During Therapy [facility] for tasks assessed/performed           Past Medical History:  Diagnosis Date  . Asthma   . Atrial fibrillation ([place])   . Back pain   . Bipolar disorder ([place])   . Constipation   . Depression   . Difficult intubation    Narrow airway  . Heartburn   . High cholesterol   . Hypertension   . Longstanding persistent atrial fibrillation ([place])   . Palpitations   . Pinched vertebral nerve    sciatica  . Prediabetes   . Sleep apnea    sleep study was "inconclusive" per patient  . SOB (shortness of breath)   . Swelling of both lower extremities   . Visit for monitoring Tikosyn therapy 08/27/2019  . Vitamin D deficiency     Past Surgical History:  Procedure Laterality Date  . bone spur surgery    . CARDIOVERSION N/A 07/24/2019   Procedure: CARDIOVERSION;  Surgeon: Buford Dresser, MD;  Location: [facility] ENDOSCOPY;  Service: Cardiovascular;  Laterality: N/A;  . COLONOSCOPY    . FOOT SURGERY  2017  . HEEL SPUR RESECTION Left 03/15/2016   Procedure: ACHILLIES TENDON REPAIR AND PUMP BUMP EXCISION;  Surgeon: Melrose Nakayama, MD;  Location: [facility];  Service: Orthopedics;  Laterality: Left;  . PACEMAKER IMPLANT N/A 02/06/2020   Procedure: PACEMAKER IMPLANT;  Surgeon: Thompson Grayer, MD;  Location: [facility] CV LAB;   Service: Cardiovascular;  Laterality: N/A;  . SHOULDER SURGERY  2010  . TOE SURGERY  2011  . TONSILLECTOMY      There were no vitals filed for this visit.    Subjective Assessment - 03/17/20 1535    Subjective Pt reports she has had bouts of vertigo with lying down and then feels it again when she sits up; started approx. early Sept.; states she took meclizine last night (took it because it was prescribed - was not really needed as not much vertigo last night)    Pertinent History pacemanker implant 8--26-21    Patient Stated Goals resolve the vertigo    Currently in Pain? Yes    Pain Score 8     Pain Location Hip    Pain Orientation Right    Pain Descriptors / Indicators Aching;Discomfort    Pain Type Acute pain    Pain Onset In the past 7 days              [facility] PT Assessment - 03/18/20 0001      Assessment   Medical Diagnosis Vertigo    Referring Provider (PT) Dr. Melinda Crutch    Onset Date/Surgical Date --   early Sept. 2021     Precautions   Precautions None      Balance Screen  Has the patient fallen in the past 6 months Yes    How many times? 1    Has the patient had a decrease in activity level because of a fear of falling?  No    Is the patient reluctant to leave their home because of a fear of falling?  No                  Vestibular Assessment - 03/18/20 0001      Symptom Behavior   Type of Dizziness  Spinning    Frequency of Dizziness varies    Duration of Dizziness secs to minutes    Progression of Symptoms No change since onset      Positional Testing   Dix-Hallpike Dix-Hallpike Right;Dix-Hallpike Left    Sidelying Test Sidelying Right;Sidelying Left      Dix-Hallpike Right   Dix-Hallpike Right Duration none    Dix-Hallpike Right Symptoms No nystagmus      Dix-Hallpike Left   Dix-Hallpike Left Duration none    Dix-Hallpike Left Symptoms No nystagmus      Sidelying Right   Sidelying Right Duration none    Sidelying Right Symptoms No  nystagmus      Sidelying Left   Sidelying Left Duration none    Sidelying Left Symptoms No nystagmus      Positional Sensitivities   Up from Right Hallpike Mild dizziness    Up from Left Hallpike Mild dizziness    Rolling Right No dizziness    Rolling Left Mild dizziness              Objective measurements completed on examination: See above findings.               PT Education - 03/18/20 1931    Education Details instructed in Epley and Brandt-Daroff exs for self treatment prn    Person(s) Educated Patient    Methods Explanation;Demonstration;Handout    Comprehension Verbalized understanding;Returned demonstration                       Plan - 03/18/20 1934    Clinical Impression Statement Pt's symptoms consistent with Lt posterior canal BPPV but no nystagmus and no c/o spinning vertigo were provoked/noted with Lt Dix-Hallpike test.  Pt reported light-headedness with return to upright from supine and from sidelying positions.  Pt did report she took Meclizine last night, so symptoms may be suppressed by Meclizine.  Pt was instructed in Lt Epley maneuver for self treatment prn and also in Brandt-Daroff exercises.  Pt to call if follow up appt needed.    Personal Factors and Comorbidities Comorbidity 1;Time since onset of injury/illness/exacerbation    Examination-Activity Limitations Clinical cytogeneticist Activity;Laundry;Meal Prep;Cleaning    Stability/Clinical Decision Making Stable/Uncomplicated    Clinical Decision Making Low    Rehab Potential Good    PT Frequency One time visit   pt did not wish to schedule follow up appt - will call if needed   PT Treatment/Interventions Canalith Repostioning;ADLs/Self Care Home Management;Patient/family education    PT Next Visit Plan recheck Lt BPPV    Consulted and Agree with Plan of Care Patient           Patient will benefit from skilled  therapeutic intervention in order to improve the following deficits and impairments:  Dizziness  Visit Diagnosis: BPPV (benign paroxysmal positional vertigo), left - Plan: PT plan of care cert/re-cert     Problem List  Patient Active Problem List   Diagnosis Date Noted  . Sick sinus syndrome ([place]) 02/06/2020  . DOE (dyspnea on exertion) 12/30/2019  . Atypical atrial flutter ([place]) 11/25/2019  . Obstructive sleep apnea 11/15/2019  . Persistent atrial fibrillation ([place]) 08/27/2019  . Paroxysmal atrial fibrillation ([place]) 06/21/2019  . Essential hypertension 06/21/2019  . Mixed hyperlipidemia 06/21/2019  . Morbid obesity ([place]) 06/21/2019  . Family history of heart disease 06/21/2019  . Haglund's deformity of right heel 03/15/2016  . Heel spur, right 03/15/2016    [place], PT 03/18/2020, 7:43 PM  [facility] [address] [place], [place], [address] Phone: [phone]   Fax:  [phone]  Name: MARKEYA MINCY MRN: [id number] Date of Birth: 1956/03/23

## 2020-03-24 ENCOUNTER — Other Ambulatory Visit (HOSPITAL_COMMUNITY): Payer: Self-pay | Admitting: Physician Assistant

## 2020-03-25 ENCOUNTER — Ambulatory Visit: Payer: PPO | Admitting: Cardiology

## 2020-03-29 ENCOUNTER — Encounter: Payer: Self-pay | Admitting: Cardiology

## 2020-04-07 ENCOUNTER — Encounter: Payer: Self-pay | Admitting: Cardiology

## 2020-04-07 ENCOUNTER — Other Ambulatory Visit: Payer: Self-pay

## 2020-04-07 ENCOUNTER — Ambulatory Visit: Payer: PPO | Admitting: Cardiology

## 2020-04-07 VITALS — BP 132/84 | HR 97 | Ht 66.0 in | Wt 278.0 lb

## 2020-04-07 DIAGNOSIS — Z950 Presence of cardiac pacemaker: Secondary | ICD-10-CM | POA: Diagnosis not present

## 2020-04-07 DIAGNOSIS — I480 Paroxysmal atrial fibrillation: Secondary | ICD-10-CM | POA: Diagnosis not present

## 2020-04-07 DIAGNOSIS — R0600 Dyspnea, unspecified: Secondary | ICD-10-CM

## 2020-04-07 DIAGNOSIS — Z712 Person consulting for explanation of examination or test findings: Secondary | ICD-10-CM | POA: Diagnosis not present

## 2020-04-07 DIAGNOSIS — R5383 Other fatigue: Secondary | ICD-10-CM | POA: Diagnosis not present

## 2020-04-07 DIAGNOSIS — R0609 Other forms of dyspnea: Secondary | ICD-10-CM

## 2020-04-07 NOTE — Patient Instructions (Signed)

## 2020-04-07 NOTE — Progress Notes (Signed)
Cardiology Office Note:    Date:  04/07/2020   ID:  Mary Mcfarland, DOB February 27, 1956, MRN [id number]  PCP:  Lawerance Cruel, MD  Cardiologist:  Buford Dresser, MD  EP: Dr. Rayann Heman  Referring MD: Lawerance Cruel, MD   CC: follow up  History of Present Illness:    Mary Mcfarland is a 64 y.o. female with a hx of atrial fibrillation, sinus pauses now s/p PPM, hypertension, hyperlipidemia, obesity who is seen for follow up today. I initially met her 12/30/19.  Today:  We reviewed the results of her nuclear stress test. Overall has had thorough cardiac evaluation without a clear etiology for her symptoms. Reviewed today.   She is having unchanged fatigue and shortness of breath. Continues to have intermittent vertigo, went to PT and had Epley maneuvers and felt somewhat better.   Frustrated by her continued fatigue. Wants to lose weight. We discussed this today. [facility]  Still waiting on sleep study results and next steps. Started Ambien, this is helping with her sleep.  Denies chest pain, shortness of breath at rest. No PND, orthopnea, LE edema or unexpected weight gain. No syncope or palpitations.  Past Medical History:  Diagnosis Date  . Asthma   . Atrial fibrillation ([place])   . Back pain   . Bipolar disorder ([place])   . Constipation   . Depression   . Difficult intubation    Narrow airway  . Heartburn   . High cholesterol   . Hypertension   . Longstanding persistent atrial fibrillation ([place])   . Palpitations   . Pinched vertebral nerve    sciatica  . Prediabetes   . Sleep apnea    sleep study was "inconclusive" per patient  . SOB (shortness of breath)   . Swelling of both lower extremities   . Visit for monitoring Tikosyn therapy 08/27/2019  . Vitamin D deficiency     Past Surgical History:  Procedure Laterality Date  . bone spur surgery    . CARDIOVERSION N/A 07/24/2019   Procedure: CARDIOVERSION;  Surgeon:  Buford Dresser, MD;  Location[facility] ENDOSCOPY;  Service: Cardiovascular;  Laterality: N/A;  . COLONOSCOPY    . FOOT SURGERY  2017  . HEEL SPUR RESECTION Left 03/15/2016   Procedure: ACHILLIES TENDON REPAIR AND PUMP BUMP EXCISION;  Surgeon: Melrose Nakayama, MD;  Location: [facility];  Service: Orthopedics;  Laterality: Left;  . PACEMAKER IMPLANT N/A 02/06/2020   Procedure: PACEMAKER IMPLANT;  Surgeon: Thompson Grayer, MD;  Location: [facility] CV LAB;  Service: Cardiovascular;  Laterality: N/A;  . SHOULDER SURGERY  2010  . TOE SURGERY  2011  . TONSILLECTOMY      Current Medications: (ambmed) Current Outpatient Medications on File Prior to Visit  Medication Sig  . albuterol (PROVENTIL HFA;VENTOLIN HFA) 108 (90 Base) MCG/ACT inhaler Inhale 2 puffs into the lungs every 6 (six) hours as needed for wheezing or shortness of breath.  Marland Kitchen apixaban (ELIQUIS) 5 MG TABS tablet Take 1 tablet (5 mg total) by mouth 2 (two) times daily.  Marland Kitchen atorvastatin (LIPITOR) 40 MG tablet Take 1 tablet (40 mg total) by mouth daily.  Marland Kitchen buPROPion (WELLBUTRIN SR) 150 MG 12 hr tablet Take 300 mg by mouth daily.  Marland Kitchen diltiazem (CARDIZEM CD) 120 MG 24 hr capsule TAKE 1 CAPSULE BY MOUTH ONCE DAILY STOP METOPROLOL   . dofetilide (TIKOSYN) 250 MCG capsule Take 1 capsule (250 mcg total) by mouth 2 (two) times daily.  Marland Kitchen  furosemide (LASIX) 40 MG tablet Take 1 tablet (40 mg total) by mouth 2 (two) times daily. Take afternoon dose only as needed.  Marland Kitchen ibuprofen (ADVIL) 800 MG tablet Take 800 mg by mouth every 8 (eight) hours as needed for moderate pain.   Marland Kitchen lisinopril (ZESTRIL) 20 MG tablet Take 1 tablet by mouth once daily  . Naphazoline-Pheniramine (OPCON-A) 0.027-0.315 % SOLN Place 1-2 drops into both eyes 3 (three) times daily as needed (for allergies).   Marland Kitchen omeprazole (PRILOSEC) 20 MG capsule Take 20 mg by mouth daily before breakfast.    Allergies:   Niacin   Social History   Tobacco Use  . Smoking status: Former Smoker     Packs/day: 1.00    Types: Cigarettes    Quit date: 02/13/1996    Years since quitting: 24.1  . Smokeless tobacco: Never Used  Vaping Use  . Vaping Use: Never used  Substance Use Topics  . Alcohol use: Yes    Alcohol/week: 3.0 - 4.0 standard drinks    Types: 2 Shots of liquor, 1 - 2 Standard drinks or equivalent per week    Comment: occassionally  . Drug use: Never    Family History: family history includes Alcoholism in her father; Bipolar disorder in her mother; CVA in her mother; Cancer in her mother; Depression in her mother; Diabetes in her father; Drug abuse in her father; Heart attack in her father; Heart disease in her father and mother; Hypertension in her father and mother; Liver disease in her father; Obesity in her father and mother; Stroke in her mother.  ROS:   Please see the history of present illness.  Additional pertinent ROS otherwise unremarkable.  EKGs/Labs/Other Studies Reviewed:    The following studies were reviewed today: -Echo 06/2019: EF 60-65%, indeterminate DF, no significant valve disease. LA diameter 4.5 cm -Ca score 06/2019: Ca score 22. Reviewed what this means -attempted CT coronary 12/18/19, highly variable heart rate, test cancelled.  EKG:  EKG is personally reviewed.  The ekg ordered 02/25/20 demonstrates atrial paced rhythm, RBBB, 70 bpm  Recent Labs: 10/03/2019: B Natriuretic Peptide 208.7 01/14/2020: ALT 22; TSH 3.610 01/21/2020: Hemoglobin 13.5; Platelets 382 02/07/2020: BUN 17; Creatinine, Ser 0.78; Magnesium 1.9; Potassium 4.6; Sodium 138  Recent Lipid Panel    Component Value Date/Time   CHOL 168 01/14/2020 1212   TRIG 78 01/14/2020 1212   HDL 61 01/14/2020 1212   CHOLHDL 3.1 11/13/2019 0843   LDLCALC 92 01/14/2020 1212    Physical Exam:    VS:  BP 132/84   Pulse 97   Ht $R'5\' 6"'yh$  (1.676 m)   Wt 278 lb (126.1 kg)   LMP 09/24/2013   SpO2 95%   BMI 44.87 kg/m     Wt Readings from Last 3 Encounters:  04/07/20 278 lb (126.1 kg)  02/27/20  275 lb (124.7 kg)  02/25/20 275 lb 12.8 oz (125.1 kg)    GEN: Well nourished, well developed in no acute distress HEENT: Normal, moist mucous membranes NECK: No JVD CARDIAC: regular rhythm, normal S1 and S2, no rubs or gallops. No murmur. VASCULAR: Radial and DP pulses 2+ bilaterally. No carotid bruits RESPIRATORY:  Clear to auscultation without rales, wheezing or rhonchi  ABDOMEN: Soft, non-tender, non-distended MUSCULOSKELETAL:  Ambulates independently SKIN: Warm and dry, trace bilateral LE edema NEUROLOGIC:  Alert and oriented x 3. No focal neuro deficits noted. PSYCHIATRIC:  Normal affect   ASSESSMENT:    1. Encounter to discuss test results   2.  DOE (dyspnea on exertion)   3. Fatigue, unspecified type   4. Paroxysmal atrial fibrillation (HCC)   5. Cardiac pacemaker in situ    PLAN:    Fatigue, dyspnea on exertion, lower extremity edema: -discussed options at length today. -she has had recent echo, monitor, and now pacemaker -nuclear stress test reassuring, discussed results today -suggests likely noncardiac cause of symptoms  Atrial fibrillation, paroxysmal: -symptoms line up with persistent afib, now paroxysmal on dofetilide -CHA2DS2/VAS Stroke Risk Points= 2 -continue apixaban -now s/p PPM to avoid conversion pauses  Hypertension: -near goal today -continue lisinopril, furosemide, diltiazem  Morbid obesity: -we discussed that weight loss is a long term goal. -discussed diet, exercise today  Possible sleep apnea: -had sleep test, awaiting next steps  Hyperlipidemia: -continue atorvastatin  Cardiac risk counseling and prevention recommendations: -recommend heart healthy/Mediterranean diet, with whole grains, fruits, vegetable, fish, lean meats, nuts, and olive oil. Limit salt. -recommend moderate walking, 3-5 times/week for 30-50 minutes each session. Aim for at least 150 minutes.week. Goal should be pace of 3 miles/hours, or walking 1.5 miles in 30  minutes -recommend avoidance of tobacco products. Avoid excess alcohol. -ASCVD risk score: The 10-year ASCVD risk score Mikey Bussing DC Brooke Bonito., et al., 2013) is: 4.9%   Values used to calculate the score:     Age: 52 years     Sex: Female     Is Non-Hispanic African American: No     Diabetic: No     Tobacco smoker: No     Systolic Blood Pressure: 235 mmHg     Is BP treated: Yes     HDL Cholesterol: 61 mg/dL     Total Cholesterol: 168 mg/dL    Plan for follow up: 6 months or sooner as needed  Buford Dresser, MD, PhD [place]  [facility]    Medication Adjustments/Labs and Tests Ordered: Current medicines are reviewed at length with the patient today.  Concerns regarding medicines are outlined above.  No orders of the defined types were placed in this encounter.  No orders of the defined types were placed in this encounter.   Patient Instructions  Medication Instructions:  Your Physician recommend you continue on your current medication as directed.    *If you need a refill on your cardiac medications before your next appointment, please call your pharmacy*   Lab Work: None ordered  Testing/Procedures: None ordered    Follow-Up: At [facility], you and your health needs are our priority.  As part of our continuing mission to provide you with exceptional heart care, we have created designated Provider Care Teams.  These Care Teams include your primary Cardiologist (physician) and Advanced Practice Providers (APPs -  Physician Assistants and Nurse Practitioners) who all work together to provide you with the care you need, when you need it.  We recommend signing up for the patient portal called "MyChart".  Sign up information is provided on this After Visit Summary.  MyChart is used to connect with patients for Virtual Visits (Telemedicine).  Patients are able to view lab/test results, encounter notes, upcoming appointments, etc.  Non-urgent messages can be sent to your  provider as well.   To learn more about what you can do with MyChart, go to [email or web address].    Your next appointment:   6 month(s)  The format for your next appointment:   In Person  Provider:   Buford Dresser, MD       Signed, Buford Dresser, MD PhD 04/07/2020  [facility]

## 2020-04-08 DIAGNOSIS — M4306 Spondylolysis, lumbar region: Secondary | ICD-10-CM | POA: Diagnosis not present

## 2020-04-15 DIAGNOSIS — M6281 Muscle weakness (generalized): Secondary | ICD-10-CM | POA: Diagnosis not present

## 2020-04-15 DIAGNOSIS — M5416 Radiculopathy, lumbar region: Secondary | ICD-10-CM | POA: Diagnosis not present

## 2020-04-21 ENCOUNTER — Encounter (HOSPITAL_COMMUNITY): Payer: Self-pay | Admitting: Psychiatry

## 2020-04-21 ENCOUNTER — Telehealth (INDEPENDENT_AMBULATORY_CARE_PROVIDER_SITE_OTHER): Payer: PPO | Admitting: Psychiatry

## 2020-04-21 DIAGNOSIS — F3181 Bipolar II disorder: Secondary | ICD-10-CM

## 2020-04-21 DIAGNOSIS — F5102 Adjustment insomnia: Secondary | ICD-10-CM | POA: Diagnosis not present

## 2020-04-21 DIAGNOSIS — F331 Major depressive disorder, recurrent, moderate: Secondary | ICD-10-CM | POA: Diagnosis not present

## 2020-04-21 MED ORDER — LAMOTRIGINE 25 MG PO TABS: 25.0000 mg | ORAL_TABLET | Freq: Every day | ORAL | 0 refills | Status: DC

## 2020-04-21 NOTE — Progress Notes (Signed)
Psychiatric Initial Adult Assessment   Patient Identification: Mary Mcfarland MRN:  [id number] Date of Evaluation:  04/21/2020 Referral Source: primary care Chief Complaint:  depression, establish care Visit Diagnosis:    ICD-10-CM   1. MDD (major depressive disorder), recurrent episode, moderate (HCC)  F33.1   2. Bipolar 2 disorder ([place])  F31.81   3. Adjustment insomnia  F51.02    I connected with Stormi Vandevelde Harwell-Lamb on 04/21/20 at  9:00 AM EST by telephone and verified that I am speaking with the correct person using two identifiers.  Location: Patient: home  Provider: home office    I discussed the limitations, risks, security and privacy concerns of performing an evaluation and management service by telephone and the availability of in person appointments. I also discussed with the patient that there may be a patient responsible charge related to this service. The patient expressed understanding and agreed to proceed.  History of Present Illness: Patient is a 64 years old currently single Caucasian female referred by primary care physician and cardiology for management and establishment of depression She has been seeing a psychiatrist for many years he closed the practice and she started seeing a psychiatrist at [facility] this year till they closed.  She has been diagnosed with possible bipolar and has been on Depakote in the past but currently she is on Wellbutrin Depakote was stopped many years ago  She feels the current medication has been helping the depression but recent or last year has been tough considering pandemic and then recent diagnosis and being evaluated by cardiology for atrial fibrillation and has been on pacemaker recently.  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  She does not endorse excessive worries but her worries are more so related to her cardiac health and she feels she is more down with some mood rather than having anxiety symptoms or panic attacks She gets emotionally upset crying spells decreased energy withdrawn decreased interest  She does have the support of her daughter who lives nearby in [place] and 2 grandkids 1 is very young  Does not endorse psychotic symptoms or current manic symptoms  She has been diagnosed with bipolar considering history she has had some episodes of increased energy racing thoughts mind racing decreased need for sleep and excessive spending but no psychotic or clear manic symptoms  Aggravating factors; health issues including cardiac difficult year Modifying factors; her kids and grandkids  Duration since young age  She has started seeing psychiatrist in 2005 in the past she has had difficult time doing the separation and then taking care of the young kid diagnosed with depression no hospitalization no suicide attempt No psychotic symptoms in the past  Patient denies drug use or alcohol use     Past Psychiatric History: Depression, possible bipolar  Previous Psychotropic Medications: Yes   Substance Abuse History in the last 12 months:  No.  Consequences of Substance Abuse: NA  Past Medical History:  Past Medical History:  Diagnosis Date  . Asthma   . Atrial fibrillation ([place])   . Back pain   . Bipolar disorder ([place])   . Constipation   . Depression   . Difficult intubation    Narrow airway  . Heartburn   .  High cholesterol   . Hypertension   . Longstanding persistent atrial fibrillation ([place])   . Palpitations   . Pinched vertebral nerve    sciatica  . Prediabetes   . Sleep apnea    sleep study was "inconclusive" per patient  . SOB (shortness of breath)   . Swelling of both lower extremities   . Visit for monitoring Tikosyn  therapy 08/27/2019  . Vitamin D deficiency     Past Surgical History:  Procedure Laterality Date  . bone spur surgery    . CARDIOVERSION N/A 07/24/2019   Procedure: CARDIOVERSION;  Surgeon: Buford Dresser, MD;  Location: [facility] ENDOSCOPY;  Service: Cardiovascular;  Laterality: N/A;  . COLONOSCOPY    . FOOT SURGERY  2017  . HEEL SPUR RESECTION Left 03/15/2016   Procedure: ACHILLIES TENDON REPAIR AND PUMP BUMP EXCISION;  Surgeon: Melrose Nakayama, MD;  Location: [place];  Service: Orthopedics;  Laterality: Left;  . PACEMAKER IMPLANT N/A 02/06/2020   Procedure: PACEMAKER IMPLANT;  Surgeon: Thompson Grayer, MD;  Location: [place] CV LAB;  Service: Cardiovascular;  Laterality: N/A;  . SHOULDER SURGERY  2010  . TOE SURGERY  2011  . TONSILLECTOMY      Family Psychiatric History: Mom diagnosed with possible bipolar  Family History:  Family History  Problem Relation Age of Onset  . Heart disease Mother   . CVA Mother   . Hypertension Mother   . Cancer Mother   . Stroke Mother   . Depression Mother   . Bipolar disorder Mother   . Obesity Mother   . Heart disease Father   . Hypertension Father   . Heart attack Father   . Diabetes Father   . Liver disease Father   . Alcoholism Father   . Drug abuse Father   . Obesity Father     Social History:   Social History   Socioeconomic History  . Marital status: Divorced    Spouse name: Not on file  . Number of children: Not on file  . Years of education: Not on file  . Highest education level: Not on file  Occupational History  . Occupation: retired  Tobacco Use  . Smoking status: Former Smoker    Packs/day: 1.00    Types: Cigarettes    Quit date: 02/13/1996    Years since quitting: 24.2  . Smokeless tobacco: Never Used  Vaping Use  . Vaping Use: Never used  Substance and Sexual Activity  . Alcohol use: Yes    Alcohol/week: 3.0 - 4.0 standard drinks    Types: 2 Shots of liquor, 1 - 2 Standard drinks or equivalent per week     Comment: occassionally  . Drug use: Never  . Sexual activity: Not on file  Other Topics Concern  . 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 Difficulty of Paying Living Expenses: Not on file  Food Insecurity:   . Worried About Charity fundraiser in the Last Year: Not on file  . Ran Out of Food in the Last Year: Not on file  Transportation Needs:   . Lack of Transportation (Medical): Not on file  . Lack of Transportation (Non-Medical): Not on file  Physical Activity:   . Days of Exercise per Week: Not on file  . Minutes of Exercise per Session: Not on file  Stress:   .  Feeling of Stress : Not on file  Social Connections:   . Frequency of Communication with Friends and Family: Not on file  . Frequency of Social Gatherings with Friends and Family: Not on file  . Attends Religious Services: Not on file  . Active Member of Clubs or Organizations: Not on file  . Attends Archivist Meetings: Not on file  . Marital Status: Not on file    Additional Social History: Grew up with her mom mostly says that she had a good childhood had siblings no trauma or abuse she grew up in [place]  Allergies:   Allergies  Allergen Reactions  . Niacin Hives    Metabolic Disorder Labs: Lab Results  Component Value Date   HGBA1C 6.1 (H) 01/14/2020   No results found for: PROLACTIN Lab Results  Component Value Date   CHOL 168 01/14/2020   TRIG 78 01/14/2020   HDL 61 01/14/2020   CHOLHDL 3.1 11/13/2019   LDLCALC 92 01/14/2020   LDLCALC 101 (H) 11/13/2019   Lab Results  Component Value Date   TSH 3.610 01/14/2020    Therapeutic Level Labs: No results found for: LITHIUM No results found for: CBMZ No results found for: VALPROATE  Current Medications: See chart    Psychiatric Specialty Exam: Review of Systems  Cardiovascular: Negative for  chest pain.  Psychiatric/Behavioral: Positive for dysphoric mood. Negative for hallucinations.    Last menstrual period 09/24/2013.There is no height or weight on file to calculate BMI.  General Appearance: Casual  Eye Contact:  Fair  Speech:  Normal Rate  Volume:  Normal  Mood:  Dysphoric  Affect:  Congruent  Thought Process:  Goal Directed  Orientation:  Full (Time, Place, and Person)  Thought Content:  Rumination  Suicidal Thoughts:  No  Homicidal Thoughts:  No  Memory:  Immediate;   Fair Recent;   Fair  Judgement:  Fair  Insight:  Shallow  Psychomotor Activity:  decreased  Concentration:  Concentration: Fair and Attention Span: Fair  Recall:  [employer] of Knowledge:Fair  Language: Good  Akathisia:  No  Handed:   AIMS (if indicated):  not done  Assets:  Desire for Improvement Housing Social Support  ADL's:  Intact  Cognition: WNL  Sleep:  variable   Screenings: PHQ2-9     Office Visit from 01/14/2020 in [place]  PHQ-2 Total Score 6  PHQ-9 Total Score 14      Assessment and Plan: as follows  Major depressive disorder recurrent moderate; continue Wellbutrin we will add a small dose of Lamictal considering past history of bipolar small dose of 25 mg increase to 50 mg in a week Bipolar 2 ; depressed start Lamictal as above discussed side effects noting rash recommend therapy and counseling for her ongoing stress related to physical health and coping Skils  Insomnia: Reviewed sleep hygiene recently started on Ambien but she does feel her it is affecting her memory so we cautioned not to take it every day till she get a sleep study done and evaluation for sleep apnea    I discussed the assessment and treatment plan with the patient. The patient was provided an opportunity to ask questions and all were answered. The patient agreed with the plan and demonstrated an understanding of the instructions.   The patient was advised to call back or seek  an in-person evaluation if the symptoms worsen or if the condition fails to improve as anticipated. FU  3-4 weeks or  earlier if needed  I provided 35 - 40 minutes of non-face-to-face time during this encounter. Merian Capron, MD 11/9/20219:44 AM

## 2020-04-21 NOTE — Telephone Encounter (Signed)
Per chart review-no sleep study information interpreted or uploaded.    Past appts shows HST 8/19 complete.  Reached out to Kia with [facility] sleep center.    Patient completed HST 8/19 but there was not enough data.  They left a message for patient to call back 8/27.       Patient will need to repeat HST or attempt precert for split night.    Sent to Dr. Theodosia Blender sleep coordinator to arrange and follow up with patient.

## 2020-04-22 DIAGNOSIS — R197 Diarrhea, unspecified: Secondary | ICD-10-CM | POA: Diagnosis not present

## 2020-04-22 DIAGNOSIS — Z86010 Personal history of colonic polyps: Secondary | ICD-10-CM | POA: Diagnosis not present

## 2020-04-22 NOTE — Telephone Encounter (Signed)
Home sleep test was inconclusive and she will need to repeat the home sleep test. Pt is aware and agreeable to the plan. HST sent to sleep pool

## 2020-05-01 ENCOUNTER — Telehealth: Payer: Self-pay | Admitting: Cardiology

## 2020-05-01 NOTE — Telephone Encounter (Signed)
Pt c/o swelling: STAT is pt has developed SOB within 24 hours  1) How much weight have you gained and in what time span? 5 lbs in a week and a half   2) If swelling, where is the swelling located? Whole left side   3) Are you currently taking a fluid pill? Yes   4) Are you currently SOB? States she always have SOB  5) Do you have a log of your daily weights (if so, list)? No   6) Have you gained 3 pounds in a day or 5 pounds in a week? No   7) Have you traveled recently? No

## 2020-05-01 NOTE — Telephone Encounter (Signed)
Returned call to pt she states that her weight is up 5 lbs in a week. She states that it is only on her left side, she is very concerned about this(only being on the left side). She did not want to take her PRN evening dose of lasix without asking if this is something to worry about. She states that she did not take her lasix yesterday, she was "out doing things" and did not take it yesterday. She does state that she is SOB, but she is most of the time it is not new for her. Please advise

## 2020-05-02 ENCOUNTER — Telehealth: Payer: Self-pay | Admitting: Cardiology

## 2020-05-02 NOTE — Telephone Encounter (Signed)
Paged by our answering service regarding patient's left arm and hand swelling.  Appears that she had called our office yesterday with recommendations to increase her Lasix with message to be forwarded to Dr. Harrell Gave for review.  She denies shortness of breath, pain in her left hand, LE edema, chest pain or numbness and tingling in her fingers.  She is anticoagulated with Eliquis and has not missed any dosing.  She reports symptoms began approximately 1 week ago however around that same time, she was started on Lamictal for bipolar disorder by her psychiatrist.  Unclear etiology of her symptoms and difficult to determine given inability to perform a physical exam.  Plan is for her to contact her psychiatrist's office to discuss given new medication with possible side effects.  We will also forward message to Dr. Harrell Gave for review however there is low suspicion of cardiac etiology given unilateral swelling and no other cardiac symptoms.  Also discussed close follow-up with her primary care provider.  Given that she is anticoagulated, low suspicion for DVT.  Kathyrn Drown NP-C [place] Pager: [phone]

## 2020-05-04 NOTE — Telephone Encounter (Signed)
Pt updated and verbalized understanding.  

## 2020-05-04 NOTE — Telephone Encounter (Signed)
She called and spoke to the on call team over the weekend. Agree with those recommendations. It would be unusual for cardiac swelling to be on only one side of the body. Agree with discussing if this could be a side effect of her new medication. Continue to take the lasix as ordered and call us if symptoms worsen.

## 2020-05-05 DIAGNOSIS — L539 Erythematous condition, unspecified: Secondary | ICD-10-CM | POA: Diagnosis not present

## 2020-05-05 DIAGNOSIS — M7989 Other specified soft tissue disorders: Secondary | ICD-10-CM | POA: Diagnosis not present

## 2020-05-06 DIAGNOSIS — M533 Sacrococcygeal disorders, not elsewhere classified: Secondary | ICD-10-CM | POA: Diagnosis not present

## 2020-05-10 LAB — CUP PACEART REMOTE DEVICE CHECK
Battery Remaining Longevity: 71 mo
Battery Remaining Percentage: 95.5 %
Battery Voltage: 3.02 V
Brady Statistic AP VP Percent: 33 %
Brady Statistic AP VS Percent: 15 %
Brady Statistic AS VP Percent: 16 %
Brady Statistic AS VS Percent: 31 %
Brady Statistic RA Percent Paced: 44 %
Brady Statistic RV Percent Paced: 48 %
Date Time Interrogation Session: 20211125225347
Implantable Lead Implant Date: 20210826
Implantable Lead Implant Date: 20210826
Implantable Lead Location: 753859
Implantable Lead Location: 753860
Implantable Pulse Generator Implant Date: 20210826
Lead Channel Impedance Value: 450 Ohm
Lead Channel Impedance Value: 710 Ohm
Lead Channel Pacing Threshold Amplitude: 0.75 V
Lead Channel Pacing Threshold Amplitude: 1 V
Lead Channel Pacing Threshold Pulse Width: 0.4 ms
Lead Channel Pacing Threshold Pulse Width: 0.5 ms
Lead Channel Sensing Intrinsic Amplitude: 2.3 mV
Lead Channel Sensing Intrinsic Amplitude: 6.9 mV
Lead Channel Setting Pacing Amplitude: 3.5 V
Lead Channel Setting Pacing Amplitude: 3.5 V
Lead Channel Setting Pacing Pulse Width: 0.4 ms
Lead Channel Setting Sensing Sensitivity: 2 mV
Pulse Gen Model: 2272
Pulse Gen Serial Number: 3859348

## 2020-05-11 ENCOUNTER — Ambulatory Visit (INDEPENDENT_AMBULATORY_CARE_PROVIDER_SITE_OTHER): Payer: PPO

## 2020-05-11 DIAGNOSIS — I495 Sick sinus syndrome: Secondary | ICD-10-CM

## 2020-05-13 ENCOUNTER — Ambulatory Visit (INDEPENDENT_AMBULATORY_CARE_PROVIDER_SITE_OTHER): Payer: PPO | Admitting: Licensed Clinical Social Worker

## 2020-05-13 DIAGNOSIS — F331 Major depressive disorder, recurrent, moderate: Secondary | ICD-10-CM

## 2020-05-13 DIAGNOSIS — F5102 Adjustment insomnia: Secondary | ICD-10-CM | POA: Diagnosis not present

## 2020-05-13 DIAGNOSIS — F3181 Bipolar II disorder: Secondary | ICD-10-CM | POA: Diagnosis not present
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
## 2020-05-15 NOTE — Progress Notes (Signed)
Remote pacemaker transmission.   

## 2020-05-18 ENCOUNTER — Ambulatory Visit: Payer: PPO | Admitting: Internal Medicine

## 2020-05-18 ENCOUNTER — Other Ambulatory Visit: Payer: Self-pay

## 2020-05-18 ENCOUNTER — Encounter: Payer: Self-pay | Admitting: Internal Medicine

## 2020-05-18 VITALS — BP 126/74 | HR 111 | Ht 66.0 in | Wt 275.6 lb

## 2020-05-18 DIAGNOSIS — I495 Sick sinus syndrome: Secondary | ICD-10-CM | POA: Diagnosis not present

## 2020-05-18 DIAGNOSIS — I10 Essential (primary) hypertension: Secondary | ICD-10-CM | POA: Diagnosis not present

## 2020-05-18 DIAGNOSIS — I4819 Other persistent atrial fibrillation: Secondary | ICD-10-CM

## 2020-05-18 NOTE — Patient Instructions (Addendum)
Medication Instructions:  Your physician recommends that you continue on your current medications as directed. Please refer to the Current Medication list given to you today.  *If you need a refill on your cardiac medications before your next appointment, please call your pharmacy*  Lab Work: None ordered.  If you have labs (blood work) drawn today and your tests are completely normal, you will receive your results only by: Marland Kitchen MyChart Message (if you have MyChart) OR . A paper copy in the mail If you have any lab test that is abnormal or we need to change your treatment, we will call you to review the results.  Testing/Procedures: Bmet & mag  Follow-Up: At [facility], you and your health needs are our priority.  As part of our continuing mission to provide you with exceptional heart care, we have created designated Provider Care Teams.  These Care Teams include your primary Cardiologist (physician) and Advanced Practice Providers (APPs -  Physician Assistants and Nurse Practitioners) who all work together to provide you with the care you need, when you need it.  We recommend signing up for the patient portal called "MyChart".  Sign up information is provided on this After Visit Summary.  MyChart is used to connect with patients for Virtual Visits (Telemedicine).  Patients are able to view lab/test results, encounter notes, upcoming appointments, etc.  Non-urgent messages can be sent to your provider as well.   To learn more about what you can do with MyChart, go to [email or web address].    Your next appointment:   Your physician wants you to follow-up in: 3 months with the [facility]. They will contact you to schedule.   Remote monitoring is used to monitor your Pacemaker from home. This monitoring reduces the number of office visits required to check your device to one time per year. It allows [place] to keep an eye on the functioning of your device to ensure it is working properly. You  are scheduled for a device check from home on 08/10/20. You may send your transmission at any time that day. If you have a wireless device, the transmission will be sent automatically. After your physician reviews your transmission, you will receive a postcard with your next transmission date.  Other Instructions:

## 2020-05-18 NOTE — Progress Notes (Signed)
PCP: Lawerance Cruel, MD Primary Cardiologist: Dr Harrell Gave Primary EP:  Dr [name] is a 64 y.o. female who presents today for routine electrophysiology followup.  Since her pacemaker implant, the patient reports doing very well.  Today, she denies symptoms of palpitations, chest pain, shortness of breath,  lower extremity edema, dizziness, presyncope, or syncope.  She has noticed recent mild L arm swelling.  She is in afib today but unaware.  The patient is otherwise without complaint today.   Past Medical History:  Diagnosis Date  . Asthma   . Atrial fibrillation ([place])   . Back pain   . Bipolar disorder ([place])   . Constipation   . Depression   . Difficult intubation    Narrow airway  . Heartburn   . High cholesterol   . Hypertension   . Longstanding persistent atrial fibrillation ([place])   . Palpitations   . Pinched vertebral nerve    sciatica  . Prediabetes   . Sleep apnea    sleep study was "inconclusive" per patient  . SOB (shortness of breath)   . Swelling of both lower extremities   . Visit for monitoring Tikosyn therapy 08/27/2019  . Vitamin D deficiency    Past Surgical History:  Procedure Laterality Date  . bone spur surgery    . CARDIOVERSION N/A 07/24/2019   Procedure: CARDIOVERSION;  Surgeon: Buford Dresser, MD;  Location: [facility] ENDOSCOPY;  Service: Cardiovascular;  Laterality: N/A;  . COLONOSCOPY    . FOOT SURGERY  2017  . HEEL SPUR RESECTION Left 03/15/2016   Procedure: ACHILLIES TENDON REPAIR AND PUMP BUMP EXCISION;  Surgeon: Melrose Nakayama, MD;  Location: [place];  Service: Orthopedics;  Laterality: Left;  . PACEMAKER IMPLANT N/A 02/06/2020   Procedure: PACEMAKER IMPLANT;  Surgeon: Thompson Grayer, MD;  Location: [place] CV LAB;  Service: Cardiovascular;  Laterality: N/A;  . SHOULDER SURGERY  2010  . TOE SURGERY  2011  . TONSILLECTOMY      ROS- all systems are reviewed and negative except as per HPI above  Medicines  reviewed  Physical Exam: Vitals:   05/18/20 1437  BP: 126/74  Pulse: (!) 111  SpO2: 93%  Weight: 275 lb 9.6 oz (125 kg)  Height: 5\' 6"  (1.676 m)    GEN- The patient is well appearing, alert and oriented x 3 today.   Head- normocephalic, atraumatic Eyes-  Sclera clear, conjunctiva pink Ears- hearing intact Oropharynx- clear Lungs- Clear to ausculation bilaterally, normal work of breathing Chest- pacemaker pocket is well healed Heart- irregular rate and rhythm GI- soft, NT, ND, + BS Extremities- no clubbing, cyanosis, or edema  Pacemaker interrogation- reviewed in detail today,  See PACEART report  ekg tracing ordered today is personally reviewed and shows afib, V rate 111 bpm, RBBB  Assessment and Plan:  1. Symptomatic sinus bradycardia  Normal pacemaker function See Pace Art report No changes today she is not device dependant today  2. Persistent afib Doing reasonably well with tikosyn chad2vasc score is at least 2.  She is on eliquis afib burden is 2.1% Check bmet, mg today She requires close follow-up on tikosyn to avoid toxicity Repeat remote interrogation in 1 week.  If still in AF will need cardioversion.  Importance of compliance with eliquis stressed today  3. HTN Stable No change required today  4. Obesity Body mass index is 44.48 kg/m. Lifestyle modification is advised  5. L arm swelling Mild,  Likely due to recent  PPM implant and venous occlusive effects.  I have advised arm elevation at night.  She is already on anticoagulation and therefore is highly unlikely to have a thrombotic issue.  This would be mechanical from leads in the L subclavian vein.  No further workup is advised.  Risks, benefits and potential toxicities for medications prescribed and/or refilled reviewed with patient today.   Thompson Grayer MD, [facility] 05/18/2020 3:04 PM

## 2020-05-19 ENCOUNTER — Ambulatory Visit: Payer: HMO | Admitting: Cardiovascular Disease

## 2020-05-19 LAB — BASIC METABOLIC PANEL
BUN/Creatinine Ratio: 20 (ref 12–28)
BUN: 17 mg/dL (ref 8–27)
CO2: 27 mmol/L (ref 20–29)
Calcium: 9.2 mg/dL (ref 8.7–10.3)
Chloride: 106 mmol/L (ref 96–106)
Creatinine, Ser: 0.85 mg/dL (ref 0.57–1.00)
GFR calc Af Amer: 85 mL/min/{1.73_m2} (ref >59)
GFR calc non Af Amer: 74 mL/min/{1.73_m2} (ref >59)
Glucose: 152 mg/dL — ABNORMAL HIGH (ref 65–99)
Potassium: 3.6 mmol/L (ref 3.5–5.2)
Sodium: 144 mmol/L (ref 134–144)

## 2020-05-19 LAB — MAGNESIUM: Magnesium: 1.8 mg/dL (ref 1.6–2.3)

## 2020-05-20 ENCOUNTER — Telehealth: Payer: Self-pay | Admitting: Cardiology

## 2020-05-20 DIAGNOSIS — L821 Other seborrheic keratosis: Secondary | ICD-10-CM | POA: Diagnosis not present

## 2020-05-20 DIAGNOSIS — Z1283 Encounter for screening for malignant neoplasm of skin: Secondary | ICD-10-CM | POA: Diagnosis not present

## 2020-05-20 DIAGNOSIS — R609 Edema, unspecified: Secondary | ICD-10-CM | POA: Diagnosis not present

## 2020-05-20 NOTE — Telephone Encounter (Signed)
Patient went to her Dermatologist today and they were concerned about the marks on her upper left arm. They look like broken blood vessels and capillaries. The Dermatologist said the patients whole left arm is swollen.  The Dermatologist recommended that the patient have an ultrasound to make sure there are no blood clots. The patient wanted to know if Dr. Harrell Gave would order the necessary testing done.  The patient said she showed Dr. Rayann Heman at her recent visit but Dr. Rayann Heman did not seem concerned.

## 2020-05-20 NOTE — Telephone Encounter (Signed)
Typically superficial veins are not the ones we worry about blood clots. We worry about deep clots in the arms. This will need to be examined to see if an ultrasound is needed. I don't think I have openings this week, but we can either have her see an APP or see if there is a time next weekend. She could also see if Dr. Harrington Challenger could see her sooner. But I wouldn't order the ultrasound without looking at it first.

## 2020-05-20 NOTE — Addendum Note (Signed)
Addended by: Rose Phi on: 05/20/2020 07:57 AM   Modules accepted: Orders

## 2020-05-20 NOTE — Telephone Encounter (Signed)
Per chart review, pt has an appointment scheduled for 12/10

## 2020-05-21 ENCOUNTER — Telehealth (HOSPITAL_COMMUNITY): Payer: PPO | Admitting: Psychiatry

## 2020-05-22 ENCOUNTER — Encounter: Payer: Self-pay | Admitting: Cardiology

## 2020-05-22 ENCOUNTER — Other Ambulatory Visit: Payer: Self-pay

## 2020-05-22 ENCOUNTER — Ambulatory Visit: Payer: PPO | Admitting: Cardiology

## 2020-05-22 VITALS — BP 160/74 | HR 90 | Ht 66.0 in | Wt 279.0 lb

## 2020-05-22 DIAGNOSIS — R5383 Other fatigue: Secondary | ICD-10-CM

## 2020-05-22 DIAGNOSIS — M7989 Other specified soft tissue disorders: Secondary | ICD-10-CM

## 2020-05-22 DIAGNOSIS — I480 Paroxysmal atrial fibrillation: Secondary | ICD-10-CM | POA: Diagnosis not present

## 2020-05-22 DIAGNOSIS — I10 Essential (primary) hypertension: Secondary | ICD-10-CM | POA: Diagnosis not present

## 2020-05-22 NOTE — Patient Instructions (Signed)
Medication Instructions:  Your Physician recommend you continue on your current medication as directed.    *If you need a refill on your cardiac medications before your next appointment, please call your pharmacy*   Lab Work: None   Testing/Procedures: None   Follow-Up: At [facility], you and your health needs are our priority.  As part of our continuing mission to provide you with exceptional heart care, we have created designated Provider Care Teams.  These Care Teams include your primary Cardiologist (physician) and Advanced Practice Providers (APPs -  Physician Assistants and Nurse Practitioners) who all work together to provide you with the care you need, when you need it.  We recommend signing up for the patient portal called "MyChart".  Sign up information is provided on this After Visit Summary.  MyChart is used to connect with patients for Virtual Visits (Telemedicine).  Patients are able to view lab/test results, encounter notes, upcoming appointments, etc.  Non-urgent messages can be sent to your provider as well.   To learn more about what you can do with MyChart, go to [email or web address].    Your next appointment:   4 month(s)  The format for your next appointment:   In Person  Provider:   Buford Dresser, MD

## 2020-05-22 NOTE — Progress Notes (Signed)
Cardiology Office Note:    Date:  05/22/2020   ID:  Mary Mcfarland, DOB 09-11-1955, MRN [id number]  PCP:  Lawerance Cruel, MD  Cardiologist:  Buford Dresser, MD  EP: Dr. Rayann Heman  Referring MD: Lawerance Cruel, MD   CC: follow up  History of Present Illness:    Mary Mcfarland is a 64 y.o. female with a hx of atrial fibrillation, sinus pauses now s/p PPM, hypertension, hyperlipidemia, obesity who is seen for follow up today. I initially met her 12/30/19.  Today: Seen today for an urgent visit due to arm swelling. She also reported the left arm swelling to Dr. Rayann Heman at her visit on 05/18/20. Thought to be due to recent PPM implant and veno occlusive effects. Recommended to elevate arm. Given that she is already on anticoagulation, there was low suspicion for thrombotic issue.   She visited her dermatologist on 12/8 and they recommended that she be evaluated to see if an ultrasound is needed.   She remains fatigue and feels poorly since her pacemaker was placed. She was hopeful that this would help her feel better.  Denies chest pain, shortness of breath at rest or with normal exertion. No PND, orthopnea, LE edema or unexpected weight gain. No syncope or palpitations.   Past Medical History:  Diagnosis Date   Asthma    Atrial fibrillation (H[facility])    Back pain    Bipolar disorder (H[facility])    Constipation    Depression    Difficult intubation    Narrow airway   Heartburn    High cholesterol    Hypertension    Longstanding persistent atrial fibrillation (HCC)    Palpitations    Pinched vertebral nerve    sciatica   Prediabetes    Sleep apnea    sleep study was "inconclusive" per patient   SOB (shortness of breath)    Swelling of both lower extremities    Visit for monitoring Tikosyn therapy 08/27/2019   Vitamin D deficiency     Past Surgical History:  Procedure Laterality Date   bone spur surgery     CARDIOVERSION N/A 07/24/2019   Procedure:  CARDIOVERSION;  Surgeon: Buford Dresser, MD;  Location: [place];  Service: Cardiovascular;  Laterality: N/A;   COLONOSCOPY     FOOT SURGERY  2017   HEEL SPUR RESECTION Left 03/15/2016   Procedure: ACHILLIES TENDON REPAIR AND PUMP BUMP EXCISION;  Surgeon: Melrose Nakayama, MD;  Location: [place];  Service: Orthopedics;  Laterality: Left;   PACEMAKER IMPLANT N/A 02/06/2020   Procedure: PACEMAKER IMPLANT;  Surgeon: Thompson Grayer, MD;  Location: [place] CV LAB;  Service: Cardiovascular;  Laterality: N/A;   SHOULDER SURGERY  2010   TOE SURGERY  2011   TONSILLECTOMY      Current Medications: Current Outpatient Medications on File Prior to Visit  Medication Sig   albuterol (PROVENTIL HFA;VENTOLIN HFA) 108 (90 Base) MCG/ACT inhaler Inhale 2 puffs into the lungs every 6 (six) hours as needed for wheezing or shortness of breath.   buPROPion (WELLBUTRIN SR) 150 MG 12 hr tablet Take 300 mg by mouth daily.   Naphazoline-Pheniramine (OPCON-A) 0.027-0.315 % SOLN Place 1-2 drops into both eyes 3 (three) times daily as needed (for allergies).    omeprazole (PRILOSEC) 20 MG capsule Take 20 mg by mouth daily before breakfast.    No current facility-administered medications on file prior to visit.     Allergies:   Niacin   Social History   Tobacco  Use   Smoking status: Former Smoker    Packs/day: 1.00    Types: Cigarettes    Quit date: 02/13/1996    Years since quitting: 24.2   Smokeless tobacco: Never Used  Vaping Use   Vaping Use: Never used  Substance Use Topics   Alcohol use: Yes    Alcohol/week: 3.0 - 4.0 standard drinks    Types: 2 Shots of liquor, 1 - 2 Standard drinks or equivalent per week    Comment: occassionally   Drug use: Never    Family History: family history includes Alcoholism in her father; Bipolar disorder in her mother; CVA in her mother; Cancer in her mother; Depression in her mother; Diabetes in her father; Drug abuse in her father; Heart attack in her father;  Heart disease in her father and mother; Hypertension in her father and mother; Liver disease in her father; Obesity in her father and mother; Stroke in her mother.  ROS:   Please see the history of present illness.  Additional pertinent ROS otherwise unremarkable.  EKGs/Labs/Other Studies Reviewed:    The following studies were reviewed today: -Echo 06/2019: EF 60-65%, indeterminate DF, no significant valve disease. LA diameter 4.5 cm -Ca score 06/2019: Ca score 22. Reviewed what this means -attempted CT coronary 12/18/19, highly variable heart rate, test cancelled.  EKG:  EKG is personally reviewed.  The ekg ordered 05/18/20 demonstrates atrial paced rhythm, RBBB, 70 bpm  Recent Labs: 10/03/2019: B Natriuretic Peptide 208.7 01/14/2020: ALT 22; TSH 3.610 01/21/2020: Hemoglobin 13.5; Platelets 382 05/18/2020: BUN 17; Creatinine, Ser 0.85; Magnesium 1.8; Potassium 3.6; Sodium 144  Recent Lipid Panel    Component Value Date/Time   CHOL 168 01/14/2020 1212   TRIG 78 01/14/2020 1212   HDL 61 01/14/2020 1212   CHOLHDL 3.1 11/13/2019 0843   LDLCALC 92 01/14/2020 1212    Physical Exam:    VS:  BP (!) 160/74   Pulse 90   Ht $R'5\' 6"'qW$  (1.676 m)   Wt 279 lb (126.6 kg)   LMP 09/24/2013   SpO2 91%   BMI 45.03 kg/m     Wt Readings from Last 3 Encounters:  05/22/20 279 lb (126.6 kg)  05/18/20 275 lb 9.6 oz (125 kg)  04/07/20 278 lb (126.1 kg)    GEN: Well nourished, well developed in no acute distress HEENT: Normal, moist mucous membranes NECK: No JVD CARDIAC: regular rhythm, normal S1 and S2, no rubs or gallops. No murmur. VASCULAR: Radial and DP pulses 2+ bilaterally. No carotid bruits RESPIRATORY:  Clear to auscultation without rales, wheezing or rhonchi  ABDOMEN: Soft, non-tender, non-distended MUSCULOSKELETAL:  Ambulates independently. Left arm examined. Minimal swelling, no tenderness, no warmth, no cord felt, no erythema. SKIN: Warm and dry, trace bilateral LE edema NEUROLOGIC:  Alert  and oriented x 3. No focal neuro deficits noted. PSYCHIATRIC:  Normal affect   ASSESSMENT:    1. Left arm swelling   2. Essential hypertension   3. Fatigue, unspecified type   4. Paroxysmal atrial fibrillation (HCC)    PLAN:    Left arm swelling: -mild, without high risk findings -agree with elevation as able -instructed to contact us if does not improve  Fatigue, dyspnea on exertion, lower extremity edema: -she has had recent echo, monitor, pacemaker, and nuclear stress test -suggests likely noncardiac cause of symptoms   Atrial fibrillation, paroxysmal: -symptoms line up with persistent afib, now paroxysmal on dofetilide -CHA2DS2/VAS Stroke Risk Points= 2 -continue apixaban -now s/p PPM to avoid conversion pauses  Hypertension: -elevated today, but has run low in the past -continue lisinopril, furosemide, diltiazem   Morbid obesity: -we discussed that weight loss is a long term goal. -discussed diet, exercise   Possible sleep apnea: -sleep study pending   Hyperlipidemia: -continue atorvastatin  Cardiac risk counseling and prevention recommendations: -recommend heart healthy/Mediterranean diet, with whole grains, fruits, vegetable, fish, lean meats, nuts, and olive oil. Limit salt. -recommend moderate walking, 3-5 times/week for 30-50 minutes each session. Aim for at least 150 minutes.week. Goal should be pace of 3 miles/hours, or walking 1.5 miles in 30 minutes -recommend avoidance of tobacco products. Avoid excess alcohol. -ASCVD risk score: The 10-year ASCVD risk score Mikey Bussing DC Brooke Bonito., et al., 2013) is: 7.1%   Values used to calculate the score:     Age: 64 years     Sex: Female     Is Non-Hispanic African American: No     Diabetic: No     Tobacco smoker: No     Systolic Blood Pressure: 253 mmHg     Is BP treated: Yes     HDL Cholesterol: 61 mg/dL     Total Cholesterol: 168 mg/dL    Plan for follow up: 4 months or sooner as needed  Buford Dresser,  MD, PhD [place]  [facility]    Medication Adjustments/Labs and Tests Ordered: Current medicines are reviewed at length with the patient today.  Concerns regarding medicines are outlined above.  No orders of the defined types were placed in this encounter.  No orders of the defined types were placed in this encounter.   Patient Instructions  Medication Instructions:  Your Physician recommend you continue on your current medication as directed.    *If you need a refill on your cardiac medications before your next appointment, please call your pharmacy*   Lab Work: None   Testing/Procedures: None   Follow-Up: At [facility], you and your health needs are our priority.  As part of our continuing mission to provide you with exceptional heart care, we have created designated Provider Care Teams.  These Care Teams include your primary Cardiologist (physician) and Advanced Practice Providers (APPs -  Physician Assistants and Nurse Practitioners) who all work together to provide you with the care you need, when you need it.  We recommend signing up for the patient portal called "MyChart".  Sign up information is provided on this After Visit Summary.  MyChart is used to connect with patients for Virtual Visits (Telemedicine).  Patients are able to view lab/test results, encounter notes, upcoming appointments, etc.  Non-urgent messages can be sent to your provider as well.   To learn more about what you can do with MyChart, go to [email or web address].    Your next appointment:   4 month(s)  The format for your next appointment:   In Person  Provider:   Buford Dresser, MD    Signed, Buford Dresser, MD PhD 05/22/2020     [place]

## 2020-05-24 ENCOUNTER — Other Ambulatory Visit (HOSPITAL_COMMUNITY): Payer: Self-pay | Admitting: Psychiatry

## 2020-05-25 ENCOUNTER — Telehealth: Payer: Self-pay

## 2020-05-25 NOTE — Telephone Encounter (Signed)
Calling to see if patient can send a manual transmission to see if she is still in AF. Patient states she is not at home right now but will send once she gets home.   Per Dr. Rayann Heman, if patient is not in AF, no changes. If patient is still in AF, set up for Cardioversion with Dr. Rayann Heman.

## 2020-05-27 NOTE — Telephone Encounter (Signed)
Transmission received 05/27/2020.

## 2020-05-27 NOTE — Telephone Encounter (Signed)
Remote transmission reviewed and patient no longer in AF at the time on 05/27/20 @ 1037. Patient notified and will call office if she has any questions or concerns.

## 2020-06-01 DIAGNOSIS — M7731 Calcaneal spur, right foot: Secondary | ICD-10-CM | POA: Diagnosis not present

## 2020-06-08 ENCOUNTER — Encounter: Payer: Self-pay | Admitting: Cardiology

## 2020-06-10 ENCOUNTER — Telehealth: Payer: Self-pay | Admitting: Cardiology

## 2020-06-10 NOTE — Telephone Encounter (Signed)
   Primary Cardiologist: Jodelle Red, MD  Chart reviewed as part of pre-operative protocol coverage. Patient was contacted 06/10/2020 in reference to pre-operative risk assessment for pending surgery as outlined below.  Mary Mcfarland was last seen on 05/22/20 by Dr. Cristal Deer.  Since that day, Mary Mcfarland has done well.  Therefore, based on ACC/AHA guidelines, the patient would be at acceptable risk for the planned procedure without further cardiovascular testing.   Hx of paroxysmal atrial fibrillation, sinus pauses s/p PPM, HTN, HLD, obesity. Gated myoview 02/28/20 was low risk. Seen by Dr. Johney Frame 05/18/20 and noted to be in atrial fibrillation. Last seen 05/22/20 by Dr. Cristal Deer doing overall well with continued fatigue, dyspnea deemed noncardiac as stress test and echo in the last year unremarkable. Device check 05/25/20 showed she was back in NSR.   Of note, she is on Eliquis 5mg  BID. Clearance request was for medical clearance only. If the requesting office would like for pharmacy clearance to hold Eliquis, updated clearance will need to be requested.   The patient was advised that if she develops new symptoms prior to surgery to contact our office to arrange for a follow-up visit, and she verbalized understanding.  I will route this recommendation to the requesting party via Epic fax function and remove from pre-op pool. Please call with questions.  , NP 06/10/2020, 10:43 AM

## 2020-06-10 NOTE — Telephone Encounter (Signed)
       [facility] Pre-operative Risk Assessment    HEARTCARE STAFF: - Please ensure there is not already an duplicate clearance open for this procedure. - Under Visit Info/Reason for Call, type in Other and utilize the format Clearance MM/DD/YY or Clearance TBD. Do not use dashes or single digits. - If request is for dental extraction, please clarify the # of teeth to be extracted.  Request for surgical clearance:  1. What type of surgery is being performed? Right heel pump bump excision  2. When is this surgery scheduled? TBD  3. What type of clearance is required (medical clearance vs. Pharmacy clearance to hold med vs. Both)? Medical  4. Are there any medications that need to be held prior to surgery and how long?  5. Practice name and name of physician performing surgery? Dr. Christena Flake  6. What is the office phone number? [phone]   7.   What is the office fax number? [phone]  8.   Anesthesia type (None, local, MAC, general) ? Choice   They need clearance from Dr. Harrell Gave and Dr. Melba Coon 06/10/2020, 9:10 AM  _________________________________________________________________   (provider comments below)

## 2020-06-17 ENCOUNTER — Ambulatory Visit (INDEPENDENT_AMBULATORY_CARE_PROVIDER_SITE_OTHER): Payer: PPO | Admitting: Licensed Clinical Social Worker

## 2020-06-17 DIAGNOSIS — F5102 Adjustment insomnia: Secondary | ICD-10-CM

## 2020-06-17 DIAGNOSIS — F3181 Bipolar II disorder: Secondary | ICD-10-CM

## 2020-06-17 DIAGNOSIS — F331 Major depressive disorder, recurrent, moderate: Secondary | ICD-10-CM | POA: Diagnosis not present

## 2020-06-17 NOTE — Progress Notes (Signed)
Virtual Visit via Telephone Note  I connected with Mary Mcfarland on 06/17/20 at  9:00 AM EST by telephone and verified that I am speaking with the correct person using two identifiers.  Location: Patient: home Provider: home office   I discussed the limitations, risks, security and privacy concerns of performing an evaluation and management service by telephone and the availability of in person appointments. I also discussed with the patient that there may be a patient responsible charge related to this service. The patient expressed understanding and agreed to proceed.   I discussed the assessment and treatment plan with the patient. The patient was provided an opportunity to ask questions and all were answered. The patient agreed with the plan and demonstrated an understanding of the instructions.   The patient was advised to call back or seek an in-person evaluation if the symptoms worsen or if the condition fails to improve as anticipated.  I provided 55 minutes of non-face-to-face time during this encounter.  THERAPIST PROGRESS NOTE  Session Time: 9:00 AM to 9:55 AM  Participation Level: Active  Behavioral Response: CasualAlertDepressed  Type of Therapy: Individual Therapy  Treatment Goals addressed:  Decrease depression, coping Interventions: Solution Focused, Strength-based, Supportive and Other: coping  Summary: Mary Mcfarland is a 65 y.o. female who presents with son-in-law in the hospital. Concerned about the after effects of COVID. He got out a little over a week. Her stepdaughter's husband who lives in [place]. Aunt moving to this area Gained weight with heart, Achille's heel , COVID. Have to get it off and don't have energy to get it off. Vicious circle. Plans to start walking with daughter. Helping daughter with baby. Doesn't have the energy that she did before to deal with the baby. Try to help out there with them, spend time with aunt and uncle and help  with them moving and they are 70's. It is too much. Want to be there for family don't know if they realize if they understand what is going on with her. Aunt understands because she has physical issues. Mary Mcfarland and Mary Mcfarland (daughter and son-in-law) don't understand as much. Does say no sometimes. Able to say not up for it need a down day. Kids are source for mood issues. Feel something going on but cardiologist say ok. Feel they are professionals but body doesn't feel right. Identifies anger as part of depression, Things go through her head about she has  been through how it started one minute ok then seeing doctor for med check and only for doctor say arrythmia, sent to cardiologist, [facility]-fib clinic [facility] they were great where she went to get EKG's and listen to heart. Had to go back to doctor and listen to heart again says now pauses in heart beat. Sent to cardiologist who deals with electrical part of the heart-did the pacemaker. Ended up getting another cardiologist. Taken its toll mentally. Lost it at her appointment when they said her apointment was next month. Accumulation of everything.  We will continue next session      Suicidal/Homicidal: No  Therapist Response: Therapist reviewed symptoms, facilitated expression of thoughts and feelings, utilize session to explore more patient's issues that will be helpful and treatment interventions.  Noted feelings of being overwhelmed with what happened her past year with medical issues as well as anger, assess patient processing feelings in session is helpful in addressing those issues.  Noted children as source of stressor and provided positive feedback for patient's ability to set  boundaries with them sometimes, reinforcing coping strategy that we recognized we are priority and responsible to ourselves to self-care as part of that responsibility and in patient's case very important with her medical issues.  Discussed working with patient on a narrative and  changing narrative moving forward that she can be thriving with medical condition.  Provided positive feedback for patient coming up with a strategy to exercise that will help her with energy and also will help her with mood.  Therapist completed treatment plan and patient gave consent to complete virtually.  Plan: Return again in 1 week.2.  Therapist work with patient on decreasing depressive symptoms, work through anger help patient with changing narrative to be more hopeful, stress management  Diagnosis: Axis I:  major depressive disorder, recurrent, moderate, bipolar 2    Axis II: No diagnosis    Mary Register, LCSW 06/17/2020

## 2020-06-22 ENCOUNTER — Other Ambulatory Visit: Payer: Self-pay

## 2020-06-22 ENCOUNTER — Telehealth (HOSPITAL_COMMUNITY): Payer: Self-pay | Admitting: *Deleted

## 2020-06-22 MED ORDER — DOFETILIDE 250 MCG PO CAPS: 250.0000 ug | ORAL_CAPSULE | Freq: Two times a day (BID) | ORAL | 11 refills | Status: DC

## 2020-06-22 MED ORDER — LISINOPRIL 20 MG PO TABS
20.0000 mg | ORAL_TABLET | Freq: Every day | ORAL | 1 refills | Status: DC
Start: 2020-06-22 — End: 2021-02-02

## 2020-06-22 NOTE — Telephone Encounter (Signed)
Pt's medication was sent to pt's pharmacy as requested. Confirmation received.  °

## 2020-06-22 NOTE — Telephone Encounter (Signed)
Patient called in needing refill of lisinopril 20mg  to [facility]. Pt following with Dr. Harrell Gave will forward to their refill department.

## 2020-06-24 ENCOUNTER — Ambulatory Visit (HOSPITAL_COMMUNITY): Payer: PPO | Admitting: Licensed Clinical Social Worker

## 2020-06-24 ENCOUNTER — Other Ambulatory Visit (HOSPITAL_COMMUNITY): Payer: Self-pay | Admitting: Psychiatry

## 2020-06-26 DIAGNOSIS — I10 Essential (primary) hypertension: Secondary | ICD-10-CM | POA: Diagnosis not present

## 2020-06-26 DIAGNOSIS — E782 Mixed hyperlipidemia: Secondary | ICD-10-CM | POA: Diagnosis not present

## 2020-06-26 DIAGNOSIS — I4891 Unspecified atrial fibrillation: Secondary | ICD-10-CM | POA: Diagnosis not present

## 2020-06-26 DIAGNOSIS — E559 Vitamin D deficiency, unspecified: Secondary | ICD-10-CM | POA: Diagnosis not present

## 2020-06-26 DIAGNOSIS — R7303 Prediabetes: Secondary | ICD-10-CM | POA: Diagnosis not present

## 2020-06-26 NOTE — Telephone Encounter (Signed)
HST approved by [employer]. Start/end Date, 06/04/20-09/02/2020.

## 2020-07-01 ENCOUNTER — Ambulatory Visit (HOSPITAL_COMMUNITY): Payer: PPO | Admitting: Licensed Clinical Social Worker

## 2020-07-02 ENCOUNTER — Other Ambulatory Visit (HOSPITAL_COMMUNITY): Payer: Self-pay

## 2020-07-02 MED ORDER — LAMOTRIGINE 25 MG PO TABS: ORAL_TABLET | ORAL | 1 refills | Status: DC

## 2020-07-08 ENCOUNTER — Ambulatory Visit (INDEPENDENT_AMBULATORY_CARE_PROVIDER_SITE_OTHER): Payer: PPO | Admitting: Licensed Clinical Social Worker

## 2020-07-08 DIAGNOSIS — F3181 Bipolar II disorder: Secondary | ICD-10-CM

## 2020-07-08 DIAGNOSIS — F331 Major depressive disorder, recurrent, moderate: Secondary | ICD-10-CM | POA: Diagnosis not present

## 2020-07-08 NOTE — Progress Notes (Signed)
Virtual Visit via Telephone Note  I connected with Mary Mcfarland on 07/08/20 at  9:00 AM EST by telephone and verified that I am speaking with the correct person using two identifiers.  Location: Patient: home Provider: home office   I discussed the limitations, risks, security and privacy concerns of performing an evaluation and management service by telephone and the availability of in person appointments. I also discussed with the patient that there may be a patient responsible charge related to this service. The patient expressed understanding and agreed to proceed.   I discussed the assessment and treatment plan with the patient. The patient was provided an opportunity to ask questions and all were answered. The patient agreed with the plan and demonstrated an understanding of the instructions.   The patient was advised to call back or seek an in-person evaluation if the symptoms worsen or if the condition fails to improve as anticipated.  I provided 52 minutes of non-face-to-face time during this encounter.   THERAPIST PROGRESS NOTE  Session Time: 9:00 AM to 9:52 AM  Participation Level: Active  Behavioral Response: CasualAlertDysphoric and Irritable  Type of Therapy: Individual Therapy  Treatment Goals addressed:  Decrease depression, coping Interventions: Solution Focused, Strength-based, Supportive and Other: coping  Summary: Mary Mcfarland is a 65 y.o. female who presents with it has been a lot. Has been sick. Everyone in family sick with whatever has been going around.  Patient was in bed for three days. Still not 100% for sure. Shares about daughter "ugly shoulder problem" having pain for a couple of years finally getting to the bottom of it said would go over and help her.(Issues with lifting with pain issues) Marcello Moores husband just had procedure ("snipped") and can't help so "grammy to the rescue". Daughter getting treatment so now better, dealing with being  sore and not excruciating pain has a MRI to see what is going on. Pain has been so bad that she has told patient that she doesn't want to live if it is going to be like this. Patient pointed out she has two kids and will figure it out.  Therapist agreed relating distortion of permanence can have a significant impact on mental health symptoms thinking things will be the same and the reality is that things change as well as very real solution of figuring things out.  Four girlfriends close with them and are the same age. All of the same page same part of their life. Therapist pointed out how helps mental health. Does make plans with them to get together. Have them come to her place for dinner. Relates issue she is dealing with "energy level sucks".  Shared with therapist a history of going with daughter and aunt and some of the dynamics of relationship with daughter were described.comments patient made such as "it is [name]'s world and we just live in it" and "when she starts to talk I don't say anything" appears easier to manage that way indicate some of the patterns that may be stressors for patient. Patient shared that she went to [facility] and describes that they work with energy and yoga another way to self heal. Did a guided meditation never meditated before and was "cool to listen and take time to take that down time to meditate and chill". Wants to continue with this having this down time, taking this time to relax and chill as a way to help her with her mental health. Had a Chakra reading only two open  brain and clean eye. Clear eye indicates good vision. Will have a private session and work on other chakras that are closed. Will report back.  Therapist reviewed symptoms, facilitated expression of thoughts and feelings, utilized this as an intervention for patient to process feelings related to things she struggles with including energy level, also stressors in her relationship with daughter.  Encourage  patient with her support group of friends of how that connection can help create the life that she wants, how participation in activities she enjoys are part of the schema of creating a life with medical condition.  Therapist was very encouraged and provided positive feedback for patient pursuing body interventions for her mental health relating this is often used in therapy is very effective way to help with symptoms, we feel things to her body so that changing body sensations helps to manage emotional experiences, meditation and relaxation will help with mood as well as helping to develop tools through more awareness of thoughts and feelings, how the situation is created when one is able to meditate.  Therapist provided active listening, open questions supportive interventions.       Suicidal/Homicidal: No  Plan: Return again in 4 weeks.2.  Patient continue with meditation, bodily interventions, working on her chakras. 3.  Therapist utilize strategies to help patient decrease depression, stress management coping  Diagnosis: Axis I: major depressive disorder, recurrent, moderate, bipolar 2    Axis II: No diagnosis    Cordella Register, LCSW 07/08/2020

## 2020-07-09 ENCOUNTER — Ambulatory Visit (HOSPITAL_COMMUNITY): Payer: PPO | Admitting: Physician Assistant

## 2020-07-10 DIAGNOSIS — G4700 Insomnia, unspecified: Secondary | ICD-10-CM | POA: Diagnosis not present

## 2020-07-10 DIAGNOSIS — I10 Essential (primary) hypertension: Secondary | ICD-10-CM | POA: Diagnosis not present

## 2020-07-10 DIAGNOSIS — J4520 Mild intermittent asthma, uncomplicated: Secondary | ICD-10-CM | POA: Diagnosis not present

## 2020-07-10 DIAGNOSIS — F331 Major depressive disorder, recurrent, moderate: Secondary | ICD-10-CM | POA: Diagnosis not present

## 2020-07-10 DIAGNOSIS — K219 Gastro-esophageal reflux disease without esophagitis: Secondary | ICD-10-CM | POA: Diagnosis not present

## 2020-07-10 DIAGNOSIS — F3181 Bipolar II disorder: Secondary | ICD-10-CM | POA: Diagnosis not present

## 2020-07-10 DIAGNOSIS — I4891 Unspecified atrial fibrillation: Secondary | ICD-10-CM | POA: Diagnosis not present

## 2020-07-10 DIAGNOSIS — E782 Mixed hyperlipidemia: Secondary | ICD-10-CM | POA: Diagnosis not present

## 2020-07-10 NOTE — Telephone Encounter (Signed)
Patient is aware and agreeable to Home Sleep Study through [facility]. Patient is scheduled for 07/31/20 at 12p to pick up home sleep kit and meet with Respiratory therapist at [facility]. Patient is aware that if this appointment date and time does not work for them they should contact Artis Delay directly at [phone]. Patient is aware that a sleep packet will be sent from [facility] in week.  Patient is agreeable to treatment and thankful for call.

## 2020-07-16 ENCOUNTER — Telehealth: Payer: Self-pay

## 2020-07-16 NOTE — Telephone Encounter (Addendum)
Patient expresses concern of back in AF. States she has been lightheaded today on and off, mainly when she bends over. Denies any chest pain, shortness of breath (reports when she walks up her stairs in apt. But not new for her). Denies any syncopal episodes. Manual transmission received and reviewed. Normal device function. Patient is in rhythm AS/AP/VS 70 bpm. No events logged. Advised patient I will forward to triage to see if she needs evaluation from general cardiology and she also needs to contact her PCP in regards to complaints. ED precautions provided. Patient agreeable to plan. Verbalized understanding.

## 2020-07-16 NOTE — Telephone Encounter (Signed)
Spoke with pt, the dizziness started today. She does not like the cuff with her bp machine. she has an appointment with her medical doctor on Monday and plans on taking the machine with her to get it calibrated. Her bp currently 116/71 and pulse 80. She is only getting dizziness with bending over. She does not feel it is vertigo as it feels different from the vertigo symptoms she has had in the past. The patient reports she usually does not know if she is in atrial fib or not. Aware dr Harrell Gave is not in the office but will make her aware of what is going on.

## 2020-07-16 NOTE — Telephone Encounter (Signed)
The pt states she has been super lightheaded today. No other symptoms.  I help the patient send a manual transmission with her home remote monitor.  Transmission received and I let her speak with Leigh, rn.

## 2020-07-17 NOTE — Telephone Encounter (Signed)
Left message for patient with Dr Judeth Cornfield recommendations.

## 2020-07-17 NOTE — Telephone Encounter (Signed)
She has an upcoming appt with EP--I would have them look at her pacemaker for rhythm issues, but if that is normal it is not uncommon that changes in position, especially bending, can cause brief dizziness. Agree with having her cuff checked.

## 2020-07-20 NOTE — Telephone Encounter (Signed)
Called patient and explained her test.

## 2020-07-21 ENCOUNTER — Telehealth: Payer: Self-pay

## 2020-07-21 NOTE — Telephone Encounter (Signed)
Merlin alert for AF > burden. Presenting rhythm AF with Vs/Vp, onset 07/19/20. Rates predominately 90-120bpm. OAC- Eliquis, on Tikosyn, Diltiazem.   Pt has history of asymptomatic persistent AF, at last office visit on 12/6 she was in AF which terminated spontaneously.    Current meds include Tikosyn 250 mcg BID, Diltiazem 120mg  daily and Eliquis 5 mg BID.    Spoke with pt, she reports at time of onset she was sick with a stomach bug, she had thought it was something she ate.  She reports vomiting most of the day.  She did take her Tikosyn and Eliquis and felt she kept them down long enough.  She did not take her Diltiazem on that day.  Yesterday she resumed all medications as ordered and has not vomitted.  She denies any cardiac symptoms.    Assisted pt with sending updated trasnmission that shows she is still in AF now.    Pt reports she has an appt at AF clinic on 2/10 with Geraldine Solar.  Requested pt send a follow-up transmission the morning of 2/10 prior to going to appt.  Reviewed ED precautions.

## 2020-07-22 DIAGNOSIS — D6869 Other thrombophilia: Secondary | ICD-10-CM | POA: Diagnosis not present

## 2020-07-22 DIAGNOSIS — Z0000 Encounter for general adult medical examination without abnormal findings: Secondary | ICD-10-CM | POA: Diagnosis not present

## 2020-07-22 DIAGNOSIS — R7303 Prediabetes: Secondary | ICD-10-CM | POA: Diagnosis not present

## 2020-07-22 DIAGNOSIS — E782 Mixed hyperlipidemia: Secondary | ICD-10-CM | POA: Diagnosis not present

## 2020-07-22 DIAGNOSIS — Z23 Encounter for immunization: Secondary | ICD-10-CM | POA: Diagnosis not present

## 2020-07-22 DIAGNOSIS — M5430 Sciatica, unspecified side: Secondary | ICD-10-CM | POA: Diagnosis not present

## 2020-07-22 DIAGNOSIS — G4700 Insomnia, unspecified: Secondary | ICD-10-CM | POA: Diagnosis not present

## 2020-07-22 DIAGNOSIS — E559 Vitamin D deficiency, unspecified: Secondary | ICD-10-CM | POA: Diagnosis not present

## 2020-07-22 DIAGNOSIS — I10 Essential (primary) hypertension: Secondary | ICD-10-CM | POA: Diagnosis not present

## 2020-07-22 DIAGNOSIS — Z1159 Encounter for screening for other viral diseases: Secondary | ICD-10-CM | POA: Diagnosis not present

## 2020-07-23 ENCOUNTER — Ambulatory Visit (HOSPITAL_COMMUNITY): Payer: PPO | Admitting: Physician Assistant

## 2020-07-23 NOTE — Telephone Encounter (Signed)
By transmission patient back in normal rhythm. Pt notified.

## 2020-07-24 ENCOUNTER — Other Ambulatory Visit (HOSPITAL_COMMUNITY): Payer: Self-pay | Admitting: Psychiatry

## 2020-07-28 ENCOUNTER — Telehealth: Payer: PPO | Admitting: Physician Assistant

## 2020-07-28 DIAGNOSIS — B029 Zoster without complications: Secondary | ICD-10-CM

## 2020-07-28 MED ORDER — VALACYCLOVIR HCL 1 G PO TABS: 1000.0000 mg | ORAL_TABLET | Freq: Three times a day (TID) | ORAL | 0 refills | Status: AC

## 2020-07-28 NOTE — Progress Notes (Signed)
I have spent 5 minutes in review of e-visit questionnaire, review and updating patient chart, medical decision making and response to patient.   Meghana Tullo Cody Favor Hackler, PA-C    

## 2020-07-28 NOTE — Progress Notes (Signed)
E-visit for Shingles   We are sorry that you are not feeling well. Here is how we plan to help!  Based on what you shared with me it looks like you have shingles.  Shingles or herpes zoster, is a common infection of the nerves.  It is a painful rash caused by the herpes zoster virus.  This is the same virus that causes chickenpox.  After a person has chickenpox, the virus remains inactive in the nerve cells.  Years later, the virus can become active again and travel to the skin.  It typically will appear on one side of the face or body.  Burning or shooting pain, tingling, or itching are early signs of the infection.  Blisters typically scab over in 7 to 10 days and clear up within 2-4 weeks. Shingles is only contagious to people that have never had the chickenpox, the chickenpox vaccine, or anyone who has a compromised immune system.  You should avoid contact with these type of people until your blisters scab over.  I have prescribed Valacyclovir 1g three times daily for 7 days   HOME CARE: Apply ice packs (wrapped in a thin towel), cool compresses, or soak in cool bath to help reduce pain. Use calamine lotion to calm itchy skin. Avoid scratching the rash. Avoid direct sunlight.  GET HELP RIGHT AWAY IF: Symptoms that don't away after treatment. A rash or blisters near your eye. Increased drainage, fever, or rash after treatment. Severe pain that doesn't go away.   MAKE SURE YOU   Understand these instructions. Will watch your condition. Will get help right away if you are not doing well or get worse.  Thank you for choosing an e-visit.  Your e-visit answers were reviewed by a board certified advanced clinical practitioner to complete your personal care plan. Depending upon the condition, your plan could have included both over the counter or prescription medications.  Please review your pharmacy choice. Make sure the pharmacy is open so you can pick up prescription now. If there is  a problem, you may contact your provider through MyChart messaging and have the prescription routed to another pharmacy.  Your safety is important to us. If you have drug allergies check your prescription carefully.   For the next 24 hours you can use MyChart to ask questions about today's visit, request a non-urgent call back, or ask for a work or school excuse. You will get an email in the next two days asking about your experience. I hope that your e-visit has been valuable and will speed your recovery.  

## 2020-07-31 ENCOUNTER — Ambulatory Visit (HOSPITAL_BASED_OUTPATIENT_CLINIC_OR_DEPARTMENT_OTHER): Payer: PPO

## 2020-07-31 ENCOUNTER — Other Ambulatory Visit: Payer: Self-pay

## 2020-07-31 DIAGNOSIS — R0683 Snoring: Secondary | ICD-10-CM

## 2020-07-31 DIAGNOSIS — I10 Essential (primary) hypertension: Secondary | ICD-10-CM

## 2020-08-03 NOTE — Progress Notes (Signed)
Primary Care Physician: Lawerance Cruel, MD Primary Cardiologist: Dr Harrell Gave  Primary Electrophysiologist: Dr Rayann Heman Referring Physician: Dr [name] is a 65 y.o. female with a history of HTN, bipolar disorder, symptomatic bradycardia s/p PPM, and persistent atrial fibrillation who presents for follow up in the [facility]. She was first diagnosed with atrial fibrillation in 2017 and then recurrence found in January of this year at PCP visit. She went in for evaluation of headaches, dizziness.  She was placed on BB and Eliquis.  She underwent attempted cardioversion in February which failed to restore SR. Patient is on Eliquis for a CHADS2VASC score of 3. She was evaluated by Dr Rayann Heman on 08/09/19 who recommended dofetilide. Patient is s/p dofetilide loading 3/16-3/19/21. She did not require DCCV. Patient was seen at her PCP office on 11/21/19 and found to be bradycardic HR 44 with PACs. She was also noted to have a 3 second pause on rhythm strip. Her BB was stopped. Patient reports that her SOB has improved off of metoprolol. Patient presented for DCCV on 12/06/19 and was found to be in SR, procedure was cancelled.   On follow up today, patient reports she has done reasonably well. She does have some paroxysmal episodes of afib but she is asymptomatic. She is s/p PPM implant 01/2020. She has periods of lightheadedness but device interrogation shows these are not associated with any arrhythmias (see phone note 07/16/20).   Today, she denies symptoms of palpitations, chest pain, orthopnea, PND, presyncope, syncope, bleeding, or neurologic sequela. The patient is tolerating medications without difficulties and is otherwise without complaint today.    Atrial Fibrillation Risk Factors:  she does have symptoms or diagnosis of sleep apnea. Sleep study pending. she does not have a history of rheumatic fever. she does not have a history of alcohol  use.   she has a BMI of Body mass index is 42.16 kg/m.Marland Kitchen Filed Weights   08/04/20 0926  Weight: 118.5 kg    Family History  Problem Relation Age of Onset  . Heart disease Mother   . CVA Mother   . Hypertension Mother   . Cancer Mother   . Stroke Mother   . Depression Mother   . Bipolar disorder Mother   . Obesity Mother   . Heart disease Father   . Hypertension Father   . Heart attack Father   . Diabetes Father   . Liver disease Father   . Alcoholism Father   . Drug abuse Father   . Obesity Father      Atrial Fibrillation Management history:  Previous antiarrhythmic drugs: dofetilide Previous cardioversions: 07/24/19 Previous ablations: none CHADS2VASC score: 2 Anticoagulation history: Eliquis   Past Medical History:  Diagnosis Date  . Asthma   . Atrial fibrillation ([place])   . Back pain   . Bipolar disorder ([place])   . Constipation   . Depression   . Difficult intubation    Narrow airway  . Heartburn   . High cholesterol   . Hypertension   . Longstanding persistent atrial fibrillation ([place])   . Palpitations   . Pinched vertebral nerve    sciatica  . Prediabetes   . Sleep apnea    sleep study was "inconclusive" per patient  . SOB (shortness of breath)   . Swelling of both lower extremities   . Visit for monitoring Tikosyn therapy 08/27/2019  . Vitamin D deficiency    Past Surgical History:  Procedure  Laterality Date  . bone spur surgery    . CARDIOVERSION N/A 07/24/2019   Procedure: CARDIOVERSION;  Surgeon: Buford Dresser, MD;  Location: [facility] ENDOSCOPY;  Service: Cardiovascular;  Laterality: N/A;  . COLONOSCOPY    . FOOT SURGERY  2017  . HEEL SPUR RESECTION Left 03/15/2016   Procedure: ACHILLIES TENDON REPAIR AND PUMP BUMP EXCISION;  Surgeon: Melrose Nakayama, MD;  Location: [facility];  Service: Orthopedics;  Laterality: Left;  . PACEMAKER IMPLANT N/A 02/06/2020   Procedure: PACEMAKER IMPLANT;  Surgeon: Thompson Grayer, MD;  Location: [place] CV  LAB;  Service: Cardiovascular;  Laterality: N/A;  . SHOULDER SURGERY  2010  . TOE SURGERY  2011  . TONSILLECTOMY      Current Outpatient Medications  Medication Sig Dispense Refill  . albuterol (PROVENTIL HFA;VENTOLIN HFA) 108 (90 Base) MCG/ACT inhaler Inhale 2 puffs into the lungs every 6 (six) hours as needed for wheezing or shortness of breath.    Marland Kitchen apixaban (ELIQUIS) 5 MG TABS tablet Take 1 tablet (5 mg total) by mouth 2 (two) times daily. 60 tablet 5  . atorvastatin (LIPITOR) 40 MG tablet Take 1 tablet (40 mg total) by mouth daily. 90 tablet 3  . buPROPion (WELLBUTRIN SR) 150 MG 12 hr tablet Take 300 mg by mouth daily.    Marland Kitchen diltiazem (DILTIAZEM CD) 120 MG 24 hr capsule Take 120 mg by mouth daily.    Marland Kitchen dofetilide (TIKOSYN) 250 MCG capsule Take 1 capsule (250 mcg total) by mouth 2 (two) times daily. 60 capsule 11  . furosemide (LASIX) 40 MG tablet Take 1 tablet (40 mg total) by mouth 2 (two) times daily. Take afternoon dose only as needed. 180 tablet 3  . lamoTRIgine (LAMICTAL) 25 MG tablet TAKE TWO TABLETS BY MOUTH ONCE DAILY 60 tablet 0  . lisinopril (ZESTRIL) 20 MG tablet Take 1 tablet (20 mg total) by mouth daily. 90 tablet 1  . loratadine (CLARITIN) 10 MG tablet Take 10 mg by mouth daily.    . Naphazoline-Pheniramine (OPCON-A) 0.027-0.315 % SOLN Place 1-2 drops into both eyes 3 (three) times daily as needed (for allergies).     Marland Kitchen omeprazole (PRILOSEC) 20 MG capsule Take 20 mg by mouth daily before breakfast.     . valACYclovir (VALTREX) 1000 MG tablet Take 1 tablet (1,000 mg total) by mouth 3 (three) times daily. 21 tablet 0  . zolpidem (AMBIEN) 10 MG tablet 1/2-1 tablet at bedtime as needed     No current facility-administered medications for this encounter.    Allergies  Allergen Reactions  . Niacin Hives    Social History   Socioeconomic History  . Marital status: Divorced    Spouse name: Not on file  . Number of children: Not on file  . Years of education: Not on file   . Highest education level: Not on file  Occupational History  . Occupation: retired  Tobacco Use  . Smoking status: Former Smoker    Packs/day: 1.00    Types: Cigarettes    Quit date: 02/13/1996    Years since quitting: 24.4  . Smokeless tobacco: Never Used  Vaping Use  . Vaping Use: Never used  Substance and Sexual Activity  . Alcohol use: Yes    Alcohol/week: 3.0 - 4.0 standard drinks    Types: 2 Shots of liquor, 1 - 2 Standard drinks or equivalent per week    Comment: occassionally  . Drug use: Never  . Sexual activity: Not on file  Other Topics Concern  .  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  Physical Exam: Vitals:   08/04/20 0926  BP: 116/66  Pulse: 75  Weight: 118.5 kg  Height: 5\' 6"  (1.676 m)    GEN- The patient is well appearing obese female, alert and oriented x 3 today.   HEENT-head normocephalic, atraumatic, sclera clear, conjunctiva pink, hearing intact, trachea midline. Lungs- Clear to ausculation bilaterally, normal work of breathing Heart- Regular rate and rhythm, no murmurs, rubs or gallops  GI- soft, NT, ND, + BS Extremities- no clubbing, cyanosis, or edema MS- no significant deformity or atrophy Skin- no rash or lesion Psych- euthymic mood, full affect Neuro- strength and sensation are intact   Wt Readings from Last 3 Encounters:  08/04/20 118.5 kg  05/22/20 126.6 kg  05/18/20 125 kg    EKG today demonstrates  AV dual pacing with occasional V pacing Vent. rate 75 BPM PR interval 198 ms QRS duration 122 ms QT/QTc 460/513 ms  (490-500 ms manually calculated)  Echo 07/01/19 demonstrated  1. Left ventricular ejection fraction, by visual estimation, is 60 to  65%. The left ventricle has normal function. There is mildly increased  left ventricular hypertrophy.  2. Left ventricular diastolic parameters are indeterminate.  3. The left ventricle has no regional wall motion abnormalities.  4. Global right ventricle has normal systolic function.The right  ventricular size is normal. No increase in right ventricular wall  thickness.  5. Left atrial size was moderately dilated.  6. Right atrial size was normal.  7. The mitral valve is normal in structure. Trivial mitral valve  regurgitation. No evidence of mitral stenosis.  8. The tricuspid valve is normal in structure.  9. The tricuspid valve is normal in structure. Tricuspid valve  regurgitation is not demonstrated.  10. The aortic valve is tricuspid. Aortic valve regurgitation is not  visualized. Mild to moderate aortic valve sclerosis/calcification without  any evidence of aortic stenosis.  11. Calcified non coronary cusp.  12. The pulmonic valve was normal in structure. Pulmonic valve  regurgitation is not visualized.  13. The inferior vena cava is normal in size with greater than 50%  respiratory variability, suggesting right atrial pressure of 3 mmHg.   Epic records are reviewed at length today  CHA2DS2-VASc Score = 2 The patient's score is based upon: CHF History: No HTN History: Yes Age : < 65 Diabetes History: No Stroke History: No Vascular Disease History: No Gender: Female    ASSESSMENT AND PLAN: 1. Persistent Atrial Fibrillation/atypical atrial flutter The patient's CHA2DS2-VASc score is 2, indicating a 2.2% annual risk of stroke.   Patient in [place] today. Will get a better sense of her afib burden with scheduled device download 2/28. Continue diltiazem 120 mg daily Continue dofetilide 250 mcg BID. QT stable. Continue Eliquis 5 mg  BID  2. Obesity Body mass index is 42.16 kg/m. Lifestyle modification was discussed and encouraged including regular physical activity and weight reduction.  3. Suspected OSA Home sleep study pending.  4. HTN Stable, no changes today.  5. Chronic  diastolic CHF No signs or symptoms of fluid overload.  6. Symptomatic bradycardia S/p PPM, followed by Dr Rayann Heman and the device clinic.   Follow up with Dr Harrell Gave as scheduled. AF clinic in 6 months.    [facility] [address] [place], [place] [address] [phone] 08/04/2020 9:58 AM

## 2020-08-04 ENCOUNTER — Encounter (HOSPITAL_COMMUNITY): Payer: Self-pay | Admitting: Physician Assistant

## 2020-08-04 ENCOUNTER — Ambulatory Visit (HOSPITAL_COMMUNITY)
Admission: RE | Admit: 2020-08-04 | Discharge: 2020-08-04 | Disposition: A | Discharge status: Home / Self Care | Payer: PPO | Source: Ambulatory Visit | Attending: Physician Assistant | Admitting: Physician Assistant

## 2020-08-04 ENCOUNTER — Other Ambulatory Visit: Payer: Self-pay

## 2020-08-04 VITALS — BP 116/66 | HR 75 | Ht 66.0 in | Wt 261.2 lb

## 2020-08-04 DIAGNOSIS — F331 Major depressive disorder, recurrent, moderate: Secondary | ICD-10-CM | POA: Diagnosis not present

## 2020-08-04 DIAGNOSIS — Z7901 Long term (current) use of anticoagulants: Secondary | ICD-10-CM | POA: Diagnosis not present

## 2020-08-04 DIAGNOSIS — R001 Bradycardia, unspecified: Secondary | ICD-10-CM | POA: Insufficient documentation

## 2020-08-04 DIAGNOSIS — F319 Bipolar disorder, unspecified: Secondary | ICD-10-CM | POA: Diagnosis not present

## 2020-08-04 DIAGNOSIS — I10 Essential (primary) hypertension: Secondary | ICD-10-CM | POA: Diagnosis not present

## 2020-08-04 DIAGNOSIS — E782 Mixed hyperlipidemia: Secondary | ICD-10-CM | POA: Diagnosis not present

## 2020-08-04 DIAGNOSIS — I4819 Other persistent atrial fibrillation: Secondary | ICD-10-CM | POA: Diagnosis not present

## 2020-08-04 DIAGNOSIS — J4520 Mild intermittent asthma, uncomplicated: Secondary | ICD-10-CM | POA: Diagnosis not present

## 2020-08-04 DIAGNOSIS — Z6841 Body Mass Index (BMI) 40.0 and over, adult: Secondary | ICD-10-CM | POA: Diagnosis not present

## 2020-08-04 DIAGNOSIS — I4891 Unspecified atrial fibrillation: Secondary | ICD-10-CM | POA: Diagnosis not present

## 2020-08-04 DIAGNOSIS — I110 Hypertensive heart disease with heart failure: Secondary | ICD-10-CM | POA: Diagnosis not present

## 2020-08-04 DIAGNOSIS — I4892 Unspecified atrial flutter: Secondary | ICD-10-CM | POA: Insufficient documentation

## 2020-08-04 DIAGNOSIS — Z87891 Personal history of nicotine dependence: Secondary | ICD-10-CM | POA: Insufficient documentation

## 2020-08-04 DIAGNOSIS — G4700 Insomnia, unspecified: Secondary | ICD-10-CM | POA: Diagnosis not present

## 2020-08-04 DIAGNOSIS — E669 Obesity, unspecified: Secondary | ICD-10-CM | POA: Insufficient documentation

## 2020-08-04 DIAGNOSIS — J3489 Other specified disorders of nose and nasal sinuses: Secondary | ICD-10-CM | POA: Diagnosis not present

## 2020-08-04 DIAGNOSIS — F3181 Bipolar II disorder: Secondary | ICD-10-CM | POA: Diagnosis not present

## 2020-08-04 DIAGNOSIS — Z79899 Other long term (current) drug therapy: Secondary | ICD-10-CM | POA: Insufficient documentation

## 2020-08-04 DIAGNOSIS — I5032 Chronic diastolic (congestive) heart failure: Secondary | ICD-10-CM | POA: Insufficient documentation

## 2020-08-04 DIAGNOSIS — K219 Gastro-esophageal reflux disease without esophagitis: Secondary | ICD-10-CM | POA: Diagnosis not present

## 2020-08-10 ENCOUNTER — Ambulatory Visit (INDEPENDENT_AMBULATORY_CARE_PROVIDER_SITE_OTHER): Payer: PPO

## 2020-08-10 DIAGNOSIS — I495 Sick sinus syndrome: Secondary | ICD-10-CM | POA: Diagnosis not present

## 2020-08-11 ENCOUNTER — Ambulatory Visit (HOSPITAL_COMMUNITY): Payer: PPO | Admitting: Licensed Clinical Social Worker

## 2020-08-11 LAB — CUP PACEART REMOTE DEVICE CHECK
Battery Remaining Longevity: 101 mo
Battery Remaining Percentage: 95.5 %
Battery Voltage: 3.02 V
Brady Statistic AP VP Percent: 38 %
Brady Statistic AP VS Percent: 14 %
Brady Statistic AS VP Percent: 13 %
Brady Statistic AS VS Percent: 25 %
Brady Statistic RA Percent Paced: 43 %
Brady Statistic RV Percent Paced: 47 %
Date Time Interrogation Session: 20220228033912
Implantable Lead Implant Date: 20210826
Implantable Lead Implant Date: 20210826
Implantable Lead Location: 753859
Implantable Lead Location: 753860
Implantable Pulse Generator Implant Date: 20210826
Lead Channel Impedance Value: 460 Ohm
Lead Channel Impedance Value: 510 Ohm
Lead Channel Pacing Threshold Amplitude: 0.75 V
Lead Channel Pacing Threshold Amplitude: 1 V
Lead Channel Pacing Threshold Pulse Width: 0.4 ms
Lead Channel Pacing Threshold Pulse Width: 0.5 ms
Lead Channel Sensing Intrinsic Amplitude: 3.7 mV
Lead Channel Sensing Intrinsic Amplitude: 5 mV
Lead Channel Setting Pacing Amplitude: 2 V
Lead Channel Setting Pacing Amplitude: 2.5 V
Lead Channel Setting Pacing Pulse Width: 0.4 ms
Lead Channel Setting Sensing Sensitivity: 2 mV
Pulse Gen Model: 2272
Pulse Gen Serial Number: 3859348

## 2020-08-11 NOTE — Progress Notes (Signed)
Therapist contacted patient through my chart and she did not respond. Session is a no show

## 2020-08-17 NOTE — Progress Notes (Signed)
Remote pacemaker transmission.   

## 2020-08-26 ENCOUNTER — Ambulatory Visit (HOSPITAL_COMMUNITY): Payer: PPO | Admitting: Licensed Clinical Social Worker

## 2020-08-26 NOTE — Addendum Note (Signed)
Addended by: Freada Bergeron on: 08/26/2020 08:44 AM   Modules accepted: Orders

## 2020-08-31 DIAGNOSIS — Z86010 Personal history of colonic polyps: Secondary | ICD-10-CM | POA: Diagnosis not present

## 2020-08-31 DIAGNOSIS — K219 Gastro-esophageal reflux disease without esophagitis: Secondary | ICD-10-CM | POA: Diagnosis not present

## 2020-08-31 DIAGNOSIS — Z6841 Body Mass Index (BMI) 40.0 and over, adult: Secondary | ICD-10-CM | POA: Diagnosis not present

## 2020-08-31 DIAGNOSIS — Z7901 Long term (current) use of anticoagulants: Secondary | ICD-10-CM | POA: Diagnosis not present

## 2020-09-02 DIAGNOSIS — I10 Essential (primary) hypertension: Secondary | ICD-10-CM | POA: Diagnosis not present

## 2020-09-02 DIAGNOSIS — K219 Gastro-esophageal reflux disease without esophagitis: Secondary | ICD-10-CM | POA: Diagnosis not present

## 2020-09-02 DIAGNOSIS — E782 Mixed hyperlipidemia: Secondary | ICD-10-CM | POA: Diagnosis not present

## 2020-09-02 DIAGNOSIS — G4700 Insomnia, unspecified: Secondary | ICD-10-CM | POA: Diagnosis not present

## 2020-09-02 DIAGNOSIS — F331 Major depressive disorder, recurrent, moderate: Secondary | ICD-10-CM | POA: Diagnosis not present

## 2020-09-02 DIAGNOSIS — I4891 Unspecified atrial fibrillation: Secondary | ICD-10-CM | POA: Diagnosis not present

## 2020-09-02 DIAGNOSIS — F3181 Bipolar II disorder: Secondary | ICD-10-CM | POA: Diagnosis not present

## 2020-09-02 DIAGNOSIS — J4520 Mild intermittent asthma, uncomplicated: Secondary | ICD-10-CM | POA: Diagnosis not present

## 2020-09-08 ENCOUNTER — Other Ambulatory Visit (HOSPITAL_COMMUNITY): Payer: Self-pay | Admitting: Psychiatry

## 2020-09-16 ENCOUNTER — Other Ambulatory Visit: Payer: Self-pay

## 2020-09-16 ENCOUNTER — Ambulatory Visit (HOSPITAL_BASED_OUTPATIENT_CLINIC_OR_DEPARTMENT_OTHER): Payer: PPO | Attending: Physician Assistant | Admitting: Cardiology

## 2020-09-16 DIAGNOSIS — G4719 Other hypersomnia: Secondary | ICD-10-CM

## 2020-09-18 ENCOUNTER — Telehealth (INDEPENDENT_AMBULATORY_CARE_PROVIDER_SITE_OTHER): Payer: PPO | Admitting: Psychiatry

## 2020-09-18 ENCOUNTER — Encounter (HOSPITAL_COMMUNITY): Payer: Self-pay | Admitting: Psychiatry

## 2020-09-18 DIAGNOSIS — F5102 Adjustment insomnia: Secondary | ICD-10-CM | POA: Diagnosis not present

## 2020-09-18 DIAGNOSIS — F3181 Bipolar II disorder: Secondary | ICD-10-CM

## 2020-09-18 MED ORDER — LAMOTRIGINE 25 MG PO TABS
50.0000 mg | ORAL_TABLET | Freq: Every day | ORAL | 0 refills | Status: DC
Start: 2020-09-18 — End: 2020-12-22

## 2020-09-18 NOTE — Progress Notes (Signed)
[facility] Follow up visit   Patient Identification: Mary Mcfarland MRN:  [id number] Date of Evaluation:  09/18/2020 Referral Source: primary care Chief Complaint:  depression follow up  Visit Diagnosis:    ICD-10-CM   1. Bipolar 2 disorder (HCC)  F31.81   2. Adjustment insomnia  F51.02    Virtual Visit via Video Note  I connected with Mary Mcfarland on 09/18/20 at  8:30 AM EDT by a video enabled telemedicine application and verified that I am speaking with the correct person using two identifiers.  Location: Patient: home Provider: home office   I discussed the limitations of evaluation and management by telemedicine and the availability of in person appointments. The patient expressed understanding and agreed to proceed.       I discussed the assessment and treatment plan with the patient. The patient was provided an opportunity to ask questions and all were answered. The patient agreed with the plan and demonstrated an understanding of the instructions.   The patient was advised to call back or seek an in-person evaluation if the symptoms worsen or if the condition fails to improve as anticipated.  I provided 20  minutes of non-face-to-face time during this encounter including chart review and documentation     History of Present Illness: Patient is a 65 years old currently single Caucasian female initially referred by primary care physician and cardiology for management and establishment of depression She has been seeing a psychiatrist for many years he closed the practice and she started seeing a psychiatrist at [facility] this year till they closed.     Last visit started lamictal and recommended sleep study  Doing some better some twitches on one hand sporadic not visible today  Sleep study inconclusive so plans at site next    Aggravating factors; health issues including cardiac difficult year Modifying factors; kids and grandkids    Patient denies drug  use or alcohol use     Past Psychiatric History: Depression, possible bipolar  Previous Psychotropic Medications: Yes     Past Medical History:  Past Medical History:  Diagnosis Date  . Asthma   . Atrial fibrillation ([place])   . Back pain   . Bipolar disorder ([place])   . Constipation   . Depression   . Difficult intubation    Narrow airway  . Heartburn   . High cholesterol   . Hypertension   . Longstanding persistent atrial fibrillation ([place])   . Palpitations   . Pinched vertebral nerve    sciatica  . Prediabetes   . Sleep apnea    sleep study was "inconclusive" per patient  . SOB (shortness of breath)   . Swelling of both lower extremities   . Visit for monitoring Tikosyn therapy 08/27/2019  . Vitamin D deficiency     Past Surgical History:  Procedure Laterality Date  . bone spur surgery    . CARDIOVERSION N/A 07/24/2019   Procedure: CARDIOVERSION;  Surgeon: Buford Dresser, MD;  Location[facility] ENDOSCOPY;  Service: Cardiovascular;  Laterality: N/A;  . COLONOSCOPY    . FOOT SURGERY  2017  . HEEL SPUR RESECTION Left 03/15/2016   Procedure: ACHILLIES TENDON REPAIR AND PUMP BUMP EXCISION;  Surgeon: Melrose Nakayama, MD;  Location: [place];  Service: Orthopedics;  Laterality: Left;  . PACEMAKER IMPLANT N/A 02/06/2020   Procedure: PACEMAKER IMPLANT;  Surgeon: Thompson Grayer, MD;  Location: [place] [facility] LAB;  Service: Cardiovascular;  Laterality: N/A;  . SHOULDER SURGERY  2010  . TOE  SURGERY  2011  . TONSILLECTOMY      Family Psychiatric History: Mom diagnosed with possible bipolar  Family History:  Family History  Problem Relation Age of Onset  . Heart disease Mother   . CVA Mother   . Hypertension Mother   . Cancer Mother   . Stroke Mother   . Depression Mother   . Bipolar disorder Mother   . Obesity Mother   . Heart disease Father   . Hypertension Father   . Heart attack Father   . Diabetes Father   . Liver disease Father   . Alcoholism Father   . Drug  abuse Father   . Obesity Father     Social History:   Social History   Socioeconomic History  . Marital status: Divorced    Spouse name: Not on file  . Number of children: Not on file  . Years of education: Not on file  . Highest education level: Not on file  Occupational History  . Occupation: retired  Tobacco Use  . Smoking status: Former Smoker    Packs/day: 1.00    Types: Cigarettes    Quit date: 02/13/1996    Years since quitting: 24.6  . Smokeless tobacco: Never Used  Vaping Use  . Vaping Use: Never used  Substance and Sexual Activity  . Alcohol use: Yes    Alcohol/week: 3.0 - 4.0 standard drinks    Types: 2 Shots of liquor, 1 - 2 Standard drinks or equivalent per week    Comment: occassionally  . Drug use: Never  . Sexual activity: Not on file  Other Topics Concern  . 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 Niacin Hives    Metabolic Disorder Labs: Lab Results  Component Value Date   HGBA1C 6.1 (H) 01/14/2020   No results found for: PROLACTIN Lab Results  Component Value Date   CHOL 168 01/14/2020   TRIG 78 01/14/2020   HDL 61 01/14/2020   CHOLHDL 3.1 11/13/2019   LDLCALC 92 01/14/2020   LDLCALC 101 (H) 11/13/2019   Lab Results  Component Value Date   TSH 3.610 01/14/2020    Therapeutic Level Labs: No results found for: LITHIUM No results found for: CBMZ No results found for: VALPROATE  Current Medications: See chart    Psychiatric Specialty Exam: Review of Systems  Cardiovascular: Negative for chest pain.  Psychiatric/Behavioral: Positive for sleep disturbance. Negative for hallucinations.     Last menstrual period 09/24/2013.There is no height or weight on file to calculate BMI.  General Appearance: Casual  Eye Contact:  Fair  Speech:  Normal Rate  Volume:  Normal  Mood: some better  Affect:  Congruent  Thought Process:  Goal Directed  Orientation:  Full (Time, Place, and Person)  Thought Content:  Rumination  Suicidal Thoughts:  No  Homicidal Thoughts:  No  Memory:  Immediate;   Fair Recent;   Fair  Judgement:  Fair  Insight:  Shallow  Psychomotor Activity:  decreased  Concentration:  Concentration: Fair and Attention Span: Fair  Recall:  [employer] [place]:  Good  Akathisia:  No  Handed:   AIMS (if indicated):  not done  Assets:  Desire for Improvement Housing Social Support  ADL's:  Intact  Cognition: WNL  Sleep:  variable   Screenings: PHQ2-9   [place] Office Visit from 01/14/2020 in [place]  PHQ-2 Total Score 6  PHQ-9 Total Score 14    Flowsheet Row Video Visit from 09/18/2020 in [place] No Risk      Assessment and Plan: as follows   Bipolar 2 ; depressed ; some better, will continue lamictal 60mg  till sleep study done No rash. Has wellbutrin. Will not increase for now, report earlier if needed Insomnia: aware of sleep hygiene, pending sleep study   Fu 38m. In office  Merian Capron, MD 4/8/20228:46 AM

## 2020-09-22 NOTE — Addendum Note (Signed)
Addended by: Freada Bergeron on: 09/22/2020 02:09 PM   Modules accepted: Orders

## 2020-09-23 NOTE — Procedures (Signed)
erro  neous encounter

## 2020-09-29 DIAGNOSIS — G4700 Insomnia, unspecified: Secondary | ICD-10-CM | POA: Diagnosis not present

## 2020-09-29 DIAGNOSIS — F3181 Bipolar II disorder: Secondary | ICD-10-CM | POA: Diagnosis not present

## 2020-09-29 DIAGNOSIS — K219 Gastro-esophageal reflux disease without esophagitis: Secondary | ICD-10-CM | POA: Diagnosis not present

## 2020-09-29 DIAGNOSIS — I10 Essential (primary) hypertension: Secondary | ICD-10-CM | POA: Diagnosis not present

## 2020-09-29 DIAGNOSIS — E782 Mixed hyperlipidemia: Secondary | ICD-10-CM | POA: Diagnosis not present

## 2020-09-29 DIAGNOSIS — F331 Major depressive disorder, recurrent, moderate: Secondary | ICD-10-CM | POA: Diagnosis not present

## 2020-09-29 DIAGNOSIS — I4891 Unspecified atrial fibrillation: Secondary | ICD-10-CM | POA: Diagnosis not present

## 2020-09-29 DIAGNOSIS — J4520 Mild intermittent asthma, uncomplicated: Secondary | ICD-10-CM | POA: Diagnosis not present

## 2020-10-02 NOTE — Telephone Encounter (Signed)
Split night ordered 

## 2020-10-05 ENCOUNTER — Ambulatory Visit: Payer: PPO | Admitting: Cardiology

## 2020-10-05 ENCOUNTER — Telehealth: Payer: Self-pay | Admitting: *Deleted

## 2020-10-05 NOTE — Telephone Encounter (Signed)
PA for in lab sleep study faxed to [facility].

## 2020-10-07 DIAGNOSIS — Z01419 Encounter for gynecological examination (general) (routine) without abnormal findings: Secondary | ICD-10-CM | POA: Diagnosis not present

## 2020-10-07 DIAGNOSIS — Z1231 Encounter for screening mammogram for malignant neoplasm of breast: Secondary | ICD-10-CM | POA: Diagnosis not present

## 2020-10-07 DIAGNOSIS — Z1211 Encounter for screening for malignant neoplasm of colon: Secondary | ICD-10-CM | POA: Diagnosis not present

## 2020-10-07 DIAGNOSIS — Z6841 Body Mass Index (BMI) 40.0 and over, adult: Secondary | ICD-10-CM | POA: Diagnosis not present

## 2020-10-14 NOTE — Telephone Encounter (Signed)
PA received for [facility] for sleep study. Auth # [id number]. Valid dates 10/05/20 to 01/01/21.

## 2020-10-16 NOTE — Telephone Encounter (Signed)
Patient is scheduled for lab study on 12/10/20. Patient understands her sleep study will be done at [facility] sleep lab. Patient understands she will receive a sleep packet in a week or so. Patient understands to call if she does not receive the sleep packet in a timely manner. Patient agrees with treatment and thanked me for call.

## 2020-10-29 ENCOUNTER — Other Ambulatory Visit: Payer: Self-pay

## 2020-10-29 ENCOUNTER — Ambulatory Visit: Payer: PPO | Admitting: Cardiology

## 2020-10-29 ENCOUNTER — Encounter: Payer: Self-pay | Admitting: Cardiology

## 2020-10-29 VITALS — BP 120/54 | HR 90 | Ht 66.0 in | Wt 265.0 lb

## 2020-10-29 DIAGNOSIS — I480 Paroxysmal atrial fibrillation: Secondary | ICD-10-CM | POA: Diagnosis not present

## 2020-10-29 DIAGNOSIS — R5383 Other fatigue: Secondary | ICD-10-CM | POA: Diagnosis not present

## 2020-10-29 DIAGNOSIS — R0609 Other forms of dyspnea: Secondary | ICD-10-CM

## 2020-10-29 DIAGNOSIS — R0600 Dyspnea, unspecified: Secondary | ICD-10-CM | POA: Diagnosis not present

## 2020-10-29 DIAGNOSIS — I10 Essential (primary) hypertension: Secondary | ICD-10-CM

## 2020-10-29 DIAGNOSIS — Z712 Person consulting for explanation of examination or test findings: Secondary | ICD-10-CM

## 2020-10-29 NOTE — Progress Notes (Signed)
Cardiology Office Note:    Date:  10/29/2020   ID:  RAIN WILHIDE, DOB [birth year], MRN [id number]  PCP:  Lawerance Cruel, MD  Cardiologist:  Buford Dresser, MD  EP: Dr. Rayann Heman  Referring MD: Lawerance Cruel, MD   CC: follow up  History of Present Illness:    Mary Mcfarland is a 65 y.o. female with a hx of atrial fibrillation, sinus pauses now s/p PPM, hypertension, hyperlipidemia, obesity who is seen for follow up today. I initially met her 12/30/19.  Today: Overall, she is feeling okay. However, she is still short of breath constantly, whether at rest or exertion (climbing stairs, making the bed). This is very frustrating for her. She denies any chest pain, palpitations, or exertional symptoms. No headaches, lightheadedness, or syncope to report. Also has no lower extremity edema, orthopnea or PND.  Since her last visit, she has lost some weight. She has started Noom,..   She is scheduled for an in-person sleep study soon, and suspects she does have sleep apnea.  She has never been diagnosed with COVID, but believes she could have been infected at two past instances.  We reviewed her prior workup and length and discussed results.  Past Medical History:  Diagnosis Date  . Asthma   . Atrial fibrillation ([place])   . Back pain   . Bipolar disorder ([place])   . Constipation   . Depression   . Difficult intubation    Narrow airway  . Heartburn   . High cholesterol   . Hypertension   . Longstanding persistent atrial fibrillation ([place])   . Palpitations   . Pinched vertebral nerve    sciatica  . Prediabetes   . Sleep apnea    sleep study was "inconclusive" per patient  . SOB (shortness of breath)   . Swelling of both lower extremities   . Visit for monitoring Tikosyn therapy 08/27/2019  . Vitamin D deficiency     Past Surgical History:  Procedure Laterality Date  . bone spur surgery    . CARDIOVERSION N/A 07/24/2019   Procedure: CARDIOVERSION;   Surgeon: Buford Dresser, MD;  Location: [facility] ENDOSCOPY;  Service: Cardiovascular;  Laterality: N/A;  . COLONOSCOPY    . FOOT SURGERY  2017  . HEEL SPUR RESECTION Left 03/15/2016   Procedure: ACHILLIES TENDON REPAIR AND PUMP BUMP EXCISION;  Surgeon: Melrose Nakayama, MD;  Location: [facility];  Service: Orthopedics;  Laterality: Left;  . PACEMAKER IMPLANT N/A 02/06/2020   Procedure: PACEMAKER IMPLANT;  Surgeon: Thompson Grayer, MD;  Location: [facility] CV LAB;  Service: Cardiovascular;  Laterality: N/A;  . SHOULDER SURGERY  2010  . TOE SURGERY  2011  . TONSILLECTOMY      Current Medications: (ambmed) Current Outpatient Medications on File Prior to Visit  Medication Sig  . albuterol (PROVENTIL HFA;VENTOLIN HFA) 108 (90 Base) MCG/ACT inhaler Inhale 2 puffs into the lungs every 6 (six) hours as needed for wheezing or shortness of breath.  Marland Kitchen apixaban (ELIQUIS) 5 MG TABS tablet Take 1 tablet (5 mg total) by mouth 2 (two) times daily.  Marland Kitchen atorvastatin (LIPITOR) 40 MG tablet Take 1 tablet (40 mg total) by mouth daily.  Marland Kitchen buPROPion (WELLBUTRIN SR) 150 MG 12 hr tablet Take 300 mg by mouth daily.  Marland Kitchen diltiazem (CARDIZEM CD) 120 MG 24 hr capsule TAKE 1 CAPSULE BY MOUTH ONCE DAILY STOP METOPROLOL   . dofetilide (TIKOSYN) 250 MCG capsule Take 1 capsule (250 mcg total) by mouth 2 (two) times  daily.  . furosemide (LASIX) 40 MG tablet Take 1 tablet (40 mg total) by mouth 2 (two) times daily. Take afternoon dose only as needed.  Marland Kitchen ibuprofen (ADVIL) 800 MG tablet Take 800 mg by mouth every 8 (eight) hours as needed for moderate pain.   Marland Kitchen lisinopril (ZESTRIL) 20 MG tablet Take 1 tablet by mouth once daily  . Naphazoline-Pheniramine (OPCON-A) 0.027-0.315 % SOLN Place 1-2 drops into both eyes 3 (three) times daily as needed (for allergies).   Marland Kitchen omeprazole (PRILOSEC) 20 MG capsule Take 20 mg by mouth daily before breakfast.    Allergies:   Niacin   Social History   Tobacco Use  . Smoking status: Former Smoker     Packs/day: 1.00    Types: Cigarettes    Quit date: 02/13/1996    Years since quitting: 24.7  . Smokeless tobacco: Never Used  Vaping Use  . Vaping Use: Never used  Substance Use Topics  . Alcohol use: Yes    Alcohol/week: 3.0 - 4.0 standard drinks    Types: 2 Shots of liquor, 1 - 2 Standard drinks or equivalent per week    Comment: occassionally  . Drug use: Never    Family History: family history includes Alcoholism in her father; Bipolar disorder in her mother; CVA in her mother; Cancer in her mother; Depression in her mother; Diabetes in her father; Drug abuse in her father; Heart attack in her father; Heart disease in her father and mother; Hypertension in her father and mother; Liver disease in her father; Obesity in her father and mother; Stroke in her mother.  ROS:   Please see the history of present illness.   (+) Shortness of breath Additional pertinent ROS otherwise unremarkable.  EKGs/Labs/Other Studies Reviewed:    The following studies were reviewed today:  Lexiscan Myoview 02/28/2020:  Lexiscan stress is electrically negative for ischemia  Myovue scan shows normal perfusion  LVEf 67%  This is a low risk study.  Pacemaker Implant 02/06/2020:  1. Successful implantation of a St Jude Medical Assurity MRI conditional  dual-chamber pacemaker for symptomatic sinus bradycardia  2. No early apparent complications.     Monitor 01/15/2020: Sinus rhythm Episodes of daytime sinus bradycardia, accelerated idioventricular rhythm, and sinus pauses are noted Frequent premature atrial contractions and nonsustained atrial tachycardia are noted  -Echo 06/2019: EF 60-65%, indeterminate DF, no significant valve disease. LA diameter 4.5 cm -Ca score 06/2019: Ca score 22. Reviewed what this means -attempted CT coronary 12/18/19, highly variable heart rate, test cancelled.  EKG:  EKG is personally reviewed.   10/29/2020: SR, frequent pacing, RBBB 02/25/20: atrial paced rhythm, RBBB, 70  bpm  Recent Labs: 01/14/2020: ALT 22; TSH 3.610 01/21/2020: Hemoglobin 13.5; Platelets 382 05/18/2020: BUN 17; Creatinine, Ser 0.85; Magnesium 1.8; Potassium 3.6; Sodium 144  Recent Lipid Panel    Component Value Date/Time   CHOL 168 01/14/2020 1212   TRIG 78 01/14/2020 1212   HDL 61 01/14/2020 1212   CHOLHDL 3.1 11/13/2019 0843   LDLCALC 92 01/14/2020 1212    Physical Exam:    VS:  BP (!) 120/54 (BP Location: Left Arm, Patient Position: Sitting, Cuff Size: Large)   Pulse 90   Ht $R'5\' 6"'[place]$  (1.676 m)   Wt 265 lb (120.2 kg)   LMP 09/24/2013   BMI 42.77 kg/m     Wt Readings from Last 3 Encounters:  10/29/20 265 lb (120.2 kg)  09/16/20 262 lb (118.8 kg)  08/04/20 261 lb 3.2 oz (118.5 kg)  GEN: Well nourished, well developed in no acute distress HEENT: Normal, moist mucous membranes NECK: No JVD CARDIAC: regular rhythm, normal S1 and S2, no rubs or gallops. No murmur. VASCULAR: Radial and DP pulses 2+ bilaterally. No carotid bruits RESPIRATORY:  Clear to auscultation without rales, wheezing or rhonchi  ABDOMEN: Soft, non-tender, non-distended MUSCULOSKELETAL:  Ambulates independently SKIN: Warm and dry, trace bilateral LE edema NEUROLOGIC:  Alert and oriented x 3. No focal neuro deficits noted. PSYCHIATRIC:  Normal affect   ASSESSMENT:    1. Paroxysmal atrial fibrillation (HCC)   2. Fatigue, unspecified type   3. DOE (dyspnea on exertion)   4. Morbid obesity ([place])   5. Encounter to discuss test results   6. Essential hypertension    PLAN:    Fatigue, dyspnea on exertion, lower extremity edema: -she has had recent echo, monitor, and now pacemaker -nuclear stress test reassuring -suggests likely noncardiac cause of symptoms -she will get sleep study, work on lifestyle. If she can treadmill at follow up, will discuss CPX testing.  Atrial fibrillation, paroxysmal: -symptoms line up with persistent afib, now paroxysmal on dofetilide -CHA2DS2/VAS Stroke Risk Points=  2 -continue apixaban -now s/p PPM to avoid conversion pauses  Hypertension: -at goal today -continue lisinopril, furosemide, diltiazem  Morbid obesity: -we discussed that weight loss is a long term goal. -discussed diet, exercise today  Possible sleep apnea: -pending in lab test after prior inconclusive tests  Hyperlipidemia: -continue atorvastatin  Cardiac risk counseling and prevention recommendations: -recommend heart healthy/Mediterranean diet, with whole grains, fruits, vegetable, fish, lean meats, nuts, and olive oil. Limit salt. -recommend moderate walking, 3-5 times/week for 30-50 minutes each session. Aim for at least 150 minutes.week. Goal should be pace of 3 miles/hours, or walking 1.5 miles in 30 minutes -recommend avoidance of tobacco products. Avoid excess alcohol. -ASCVD risk score: The 10-year ASCVD risk score Mikey Bussing DC Brooke Bonito., et al., 2013) is: 4.5%   Values used to calculate the score:     Age: 36 years     Sex: Female     Is Non-Hispanic African American: No     Diabetic: No     Tobacco smoker: No     Systolic Blood Pressure: 767 mmHg     Is BP treated: Yes     HDL Cholesterol: 61 mg/dL     Total Cholesterol: 168 mg/dL    Plan for follow up: 6 months or sooner as needed  Buford Dresser, MD, PhD [place]  [facility]    Medication Adjustments/Labs and Tests Ordered: Current medicines are reviewed at length with the patient today.  Concerns regarding medicines are outlined above.  Orders Placed This Encounter  Procedures  . EKG 12-Lead   No orders of the defined types were placed in this encounter.   Patient Instructions  Medication Instructions:  Your Physician recommend you continue on your current medication as directed.    *If you need a refill on your cardiac medications before your next appointment, please call your pharmacy*   Lab Work: None   Testing/Procedures: None   Follow-Up: At [facility], you and your  health needs are our priority.  As part of our continuing mission to provide you with exceptional heart care, we have created designated Provider Care Teams.  These Care Teams include your primary Cardiologist (physician) and Advanced Practice Providers (APPs -  Physician Assistants and Nurse Practitioners) who all work together to provide you with the care you need, when you need it.  We recommend  signing up for the patient portal called "MyChart".  Sign up information is provided on this After Visit Summary.  MyChart is used to connect with patients for Virtual Visits (Telemedicine).  Patients are able to view lab/test results, encounter notes, upcoming appointments, etc.  Non-urgent messages can be sent to your provider as well.   To learn more about what you can do with MyChart, go to [email or web address].    Your next appointment:   6 month(s) @ [address] [place], [place] [address]   The format for your next appointment:   In Person  Provider:   Buford Dresser, MD        [facility],acting as a scribe for Buford Dresser, MD.,have documented all relevant documentation on the behalf of Buford Dresser, MD,as directed by  Buford Dresser, MD while in the presence of Buford Dresser, MD.  I, Buford Dresser, MD, have reviewed all documentation for this visit. The documentation on 10/29/20 for the exam, diagnosis, procedures, and orders are all accurate and complete.  Signed, Buford Dresser, MD PhD 10/29/2020     [place]

## 2020-10-29 NOTE — Patient Instructions (Signed)
Medication Instructions:  Your Physician recommend you continue on your current medication as directed.    *If you need a refill on your cardiac medications before your next appointment, please call your pharmacy*   Lab Work: None   Testing/Procedures: None   Follow-Up: At [facility], you and your health needs are our priority.  As part of our continuing mission to provide you with exceptional heart care, we have created designated Provider Care Teams.  These Care Teams include your primary Cardiologist (physician) and Advanced Practice Providers (APPs -  Physician Assistants and Nurse Practitioners) who all work together to provide you with the care you need, when you need it.  We recommend signing up for the patient portal called "MyChart".  Sign up information is provided on this After Visit Summary.  MyChart is used to connect with patients for Virtual Visits (Telemedicine).  Patients are able to view lab/test results, encounter notes, upcoming appointments, etc.  Non-urgent messages can be sent to your provider as well.   To learn more about what you can do with MyChart, go to [email or web address].    Your next appointment:   6 month(s) @ [address] [place], [place] [address]   The format for your next appointment:   In Person  Provider:   Bridgette Christopher, MD    

## 2020-11-05 ENCOUNTER — Telehealth: Payer: Self-pay

## 2020-11-05 DIAGNOSIS — M76812 Anterior tibial syndrome, left leg: Secondary | ICD-10-CM | POA: Diagnosis not present

## 2020-11-05 DIAGNOSIS — M79671 Pain in right foot: Secondary | ICD-10-CM | POA: Diagnosis not present

## 2020-11-05 DIAGNOSIS — M7661 Achilles tendinitis, right leg: Secondary | ICD-10-CM | POA: Diagnosis not present

## 2020-11-05 NOTE — Telephone Encounter (Signed)
   Patient Name: Mary Mcfarland  DOB: 1956-05-23  MRN: [id number]   Primary Cardiologist: Buford Dresser, MD  Chart reviewed as part of pre-operative protocol coverage. 65 year old female with history of atrial fibrillation on Eliquis, sinus pauses now s/p PPM, hypertension, hyperlipidemia, and obesity who is planning to undergo a colonoscopy on 11/17/2020. Will route to pharmacy per protocol for recommendations regarding anticoagulation.    Christell Faith, PA-C 11/05/2020, 2:19 PM

## 2020-11-05 NOTE — Telephone Encounter (Signed)
   [facility] Pre-operative Risk Assessment    Patient Name: Mary Mcfarland  DOB: June 12, 1956  MRN: [id number]  Request for surgical clearance:  1. What type of surgery is being performed? COLONOSCOPY  2. When is this surgery scheduled? 11-17-20   3. What type of clearance is required (medical clearance vs. Pharmacy clearance to hold med vs. Both)? MEDICATION  4. Are there any medications that need to be held prior to surgery and how long? ELIQUIS   5. Practice name and name of physician performing surgery? [facility]    6. What is the office phone number? [phone]   7.   What is the office fax number? [phone]  8.   Anesthesia type (None, local, MAC, general) ? PROPOFOL

## 2020-11-05 NOTE — Telephone Encounter (Signed)
   Patient Name: Mary Mcfarland  DOB: 07/24/1955  MRN: [id number]   Primary Cardiologist: Buford Dresser, MD  Chart reviewed as part of pre-operative protocol coverage. Chart reviewed as part of pre-operative protocol coverage. 65 year old female with history of atrial fibrillation on Eliquis, sinus pauses now s/p PPM, hypertension, hyperlipidemia, and obesity who is planning to undergo a colonoscopy on 11/17/2020.   Per pharmacy: Patient with diagnosis of afib on Eliquis for anticoagulation.    Procedure: colonoscopy Date of procedure: 11/17/20  CHA2DS2-VASc Score = 2  This indicates a 2.2% annual risk of stroke. The patient's score is based upon: CHF History: No HTN History: Yes Diabetes History: No Stroke History: No Vascular Disease History: No Age Score: 0 Gender Score: 1      CrCl 89 ml/min Platelet count 382  Per office protocol, patient can hold Eliquis for 1-2 days prior to procedure.     Attempted to call the patient on 5/26, left VM.      Christell Faith, PA-C 11/05/2020, 5:19 PM

## 2020-11-05 NOTE — Telephone Encounter (Signed)
Patient with diagnosis of afib on Eliquis for anticoagulation.    Procedure: colonoscopy Date of procedure: 11/17/20  CHA2DS2-VASc Score = 2  This indicates a 2.2% annual risk of stroke. The patient's score is based upon: CHF History: No HTN History: Yes Diabetes History: No Stroke History: No Vascular Disease History: No Age Score: 0 Gender Score: 1      CrCl 89 ml/min Platelet count 382  Per office protocol, patient can hold Eliquis for 1-2 days prior to procedure.

## 2020-11-10 ENCOUNTER — Ambulatory Visit (INDEPENDENT_AMBULATORY_CARE_PROVIDER_SITE_OTHER): Payer: PPO

## 2020-11-10 DIAGNOSIS — I495 Sick sinus syndrome: Secondary | ICD-10-CM

## 2020-11-11 HISTORY — PX: COLONOSCOPY WITH PROPOFOL: SHX5780

## 2020-11-11 LAB — CUP PACEART REMOTE DEVICE CHECK
Battery Remaining Longevity: 98 mo
Battery Remaining Percentage: 95.5 %
Battery Voltage: 3.02 V
Brady Statistic AP VP Percent: 39 %
Brady Statistic AP VS Percent: 12 %
Brady Statistic AS VP Percent: 13 %
Brady Statistic AS VS Percent: 22 %
Brady Statistic RA Percent Paced: 45 %
Brady Statistic RV Percent Paced: 50 %
Date Time Interrogation Session: 20220530232024
Implantable Lead Implant Date: 20210826
Implantable Lead Implant Date: 20210826
Implantable Lead Location: 753859
Implantable Lead Location: 753860
Implantable Pulse Generator Implant Date: 20210826
Lead Channel Impedance Value: 460 Ohm
Lead Channel Impedance Value: 460 Ohm
Lead Channel Pacing Threshold Amplitude: 0.75 V
Lead Channel Pacing Threshold Amplitude: 1 V
Lead Channel Pacing Threshold Pulse Width: 0.4 ms
Lead Channel Pacing Threshold Pulse Width: 0.5 ms
Lead Channel Sensing Intrinsic Amplitude: 3.1 mV
Lead Channel Sensing Intrinsic Amplitude: 5.7 mV
Lead Channel Setting Pacing Amplitude: 2 V
Lead Channel Setting Pacing Amplitude: 2.5 V
Lead Channel Setting Pacing Pulse Width: 0.4 ms
Lead Channel Setting Sensing Sensitivity: 2 mV
Pulse Gen Model: 2272
Pulse Gen Serial Number: 3859348

## 2020-11-11 NOTE — Telephone Encounter (Signed)
    JASANI LENGEL DOB:  1955-10-12  MRN:  [id number]   Primary Cardiologist: Buford Dresser, MD  Chart reviewed as part of pre-operative protocol coverage. Pharmacy clearance only requested. Per pharmacy review, Miss, ALDEN FEAGAN may hold Eliquis 1-2 days prior to planned procedure. .  I will route this recommendation to the requesting party via Epic fax function and remove from pre-op pool.  Please call with questions.  Loel Dubonnet, NP 11/11/2020, 9:59 AM

## 2020-11-17 DIAGNOSIS — K648 Other hemorrhoids: Secondary | ICD-10-CM | POA: Diagnosis not present

## 2020-11-17 DIAGNOSIS — Z86010 Personal history of colonic polyps: Secondary | ICD-10-CM | POA: Diagnosis not present

## 2020-11-17 DIAGNOSIS — K621 Rectal polyp: Secondary | ICD-10-CM | POA: Diagnosis not present

## 2020-11-17 DIAGNOSIS — D120 Benign neoplasm of cecum: Secondary | ICD-10-CM | POA: Diagnosis not present

## 2020-11-17 DIAGNOSIS — K644 Residual hemorrhoidal skin tags: Secondary | ICD-10-CM | POA: Diagnosis not present

## 2020-11-17 DIAGNOSIS — D124 Benign neoplasm of descending colon: Secondary | ICD-10-CM | POA: Diagnosis not present

## 2020-11-17 DIAGNOSIS — K5730 Diverticulosis of large intestine without perforation or abscess without bleeding: Secondary | ICD-10-CM | POA: Diagnosis not present

## 2020-11-20 DIAGNOSIS — D120 Benign neoplasm of cecum: Secondary | ICD-10-CM | POA: Diagnosis not present

## 2020-11-20 DIAGNOSIS — D124 Benign neoplasm of descending colon: Secondary | ICD-10-CM | POA: Diagnosis not present

## 2020-11-20 DIAGNOSIS — K621 Rectal polyp: Secondary | ICD-10-CM | POA: Diagnosis not present

## 2020-11-24 ENCOUNTER — Ambulatory Visit (HOSPITAL_COMMUNITY): Payer: PPO | Admitting: Psychiatry

## 2020-11-27 ENCOUNTER — Other Ambulatory Visit: Payer: Self-pay | Admitting: Cardiology

## 2020-11-27 NOTE — Telephone Encounter (Signed)
Prescription refill request for Eliquis received. Indication:atrial fib Last office visit:5/22 Scr:0.8 Age: 65 Weight:120.2 kg  Prescription refilled

## 2020-11-30 ENCOUNTER — Other Ambulatory Visit: Payer: Self-pay

## 2020-11-30 ENCOUNTER — Telehealth: Payer: Self-pay | Admitting: Cardiology

## 2020-11-30 DIAGNOSIS — R600 Localized edema: Secondary | ICD-10-CM

## 2020-11-30 DIAGNOSIS — R0600 Dyspnea, unspecified: Secondary | ICD-10-CM

## 2020-11-30 DIAGNOSIS — R0609 Other forms of dyspnea: Secondary | ICD-10-CM

## 2020-11-30 MED ORDER — FUROSEMIDE 40 MG PO TABS: 40.0000 mg | ORAL_TABLET | Freq: Two times a day (BID) | ORAL | 3 refills | Status: DC

## 2020-11-30 NOTE — Telephone Encounter (Signed)
*  STAT* If patient is at the pharmacy, call can be transferred to refill team.   1. Which medications need to be refilled? (please list name of each medication and dose if known) furosemide (LASIX) 40 MG tablet (Expired)  2. Which pharmacy/location (including street and city if local pharmacy) is medication to be sent to? [facility] - [place], [place] - [place] [address]  3. Do they need a 30 day or 90 day supply? [place]

## 2020-12-01 ENCOUNTER — Ambulatory Visit (HOSPITAL_COMMUNITY): Payer: PPO | Admitting: Psychiatry

## 2020-12-02 ENCOUNTER — Telehealth (HOSPITAL_COMMUNITY): Payer: PPO | Admitting: Psychiatry

## 2020-12-02 NOTE — Progress Notes (Signed)
Remote pacemaker transmission.   

## 2020-12-04 DIAGNOSIS — E782 Mixed hyperlipidemia: Secondary | ICD-10-CM | POA: Diagnosis not present

## 2020-12-04 DIAGNOSIS — I4891 Unspecified atrial fibrillation: Secondary | ICD-10-CM | POA: Diagnosis not present

## 2020-12-04 DIAGNOSIS — G4700 Insomnia, unspecified: Secondary | ICD-10-CM | POA: Diagnosis not present

## 2020-12-04 DIAGNOSIS — F331 Major depressive disorder, recurrent, moderate: Secondary | ICD-10-CM | POA: Diagnosis not present

## 2020-12-04 DIAGNOSIS — I10 Essential (primary) hypertension: Secondary | ICD-10-CM | POA: Diagnosis not present

## 2020-12-04 DIAGNOSIS — K219 Gastro-esophageal reflux disease without esophagitis: Secondary | ICD-10-CM | POA: Diagnosis not present

## 2020-12-04 DIAGNOSIS — J4520 Mild intermittent asthma, uncomplicated: Secondary | ICD-10-CM | POA: Diagnosis not present

## 2020-12-04 DIAGNOSIS — F3181 Bipolar II disorder: Secondary | ICD-10-CM | POA: Diagnosis not present

## 2020-12-10 ENCOUNTER — Ambulatory Visit (HOSPITAL_BASED_OUTPATIENT_CLINIC_OR_DEPARTMENT_OTHER): Payer: PPO | Attending: Physician Assistant | Admitting: Cardiology

## 2020-12-10 ENCOUNTER — Other Ambulatory Visit: Payer: Self-pay

## 2020-12-10 DIAGNOSIS — Z9989 Dependence on other enabling machines and devices: Secondary | ICD-10-CM | POA: Insufficient documentation

## 2020-12-10 DIAGNOSIS — G4719 Other hypersomnia: Secondary | ICD-10-CM | POA: Diagnosis not present

## 2020-12-10 DIAGNOSIS — R0902 Hypoxemia: Secondary | ICD-10-CM | POA: Insufficient documentation

## 2020-12-10 DIAGNOSIS — G4733 Obstructive sleep apnea (adult) (pediatric): Secondary | ICD-10-CM | POA: Insufficient documentation

## 2020-12-11 NOTE — Procedures (Signed)
Patient Name: Mary Mcfarland, Mary Mcfarland Date: 12/10/2020 Gender: Female D.O.B: 07/31/1955 Age (years): 65 Referring Provider: Thompson Grayer Height (inches): 69 Interpreting Physician: Fransico Him MD, ABSM Weight (lbs): 263 RPSGT: Jorge Ny BMI: 42 MRN: [id number] Neck Size: 17.00  CLINICAL INFORMATION Sleep Study Type: Split Night CPAP  Indication for sleep study: Excessive Daytime Sleepiness, Morning Headaches, Obesity, OSA, Snoring  Epworth Sleepiness Score: 2  SLEEP STUDY TECHNIQUE As per the AASM Manual for the Scoring of Sleep and Associated Events v2.3 (April 2016) with a hypopnea requiring 4% desaturations.  The channels recorded and monitored were frontal, central and occipital EEG, electrooculogram (EOG), submentalis EMG (chin), nasal and oral airflow, thoracic and abdominal wall motion, anterior tibialis EMG, snore microphone, electrocardiogram, and pulse oximetry. Continuous positive airway pressure (CPAP) was initiated when the patient met split night criteria and was titrated according to treat sleep-disordered breathing.  MEDICATIONS Medications self-administered by patient taken the night of the study : TYLENOL, AMBIEN  RESPIRATORY PARAMETERS Diagnostic Total AHI (/hr): 34.1  RDI (/hr):39.3  OA Index (/hr): 4.7  CA Index (/hr): 0.0 REM AHI (/hr): N/A  NREM AHI (/hr):34.1  Supine AHI (/hr):37.6  Non-supine AHI (/hr):32.5 Min O2 Sat (%):82.0  Mean O2 (%): 92.4  Time below 88% (min):11.1   Titration Optimal Pressure (cm):  AHI at Optimal Pressure (/hr):N/A Min O2 at Optimal Pressure (%):53.0 Supine % at Optimal (%):N/A Sleep % at Optimal (%):N/A   SLEEP ARCHITECTURE The recording time for the entire night was 420.1 minutes.  During a baseline period of 199.9 minutes, the patient slept for 128.4 minutes in REM and nonREM, yielding a sleep efficiency of 64.2%. Sleep onset after lights out was 16.6 minutes with a REM latency of N/A minutes.  The patient spent 20.9% of the night in stage N1 sleep, 79.1% in stage N2 sleep, 0.0% in stage N3 and 0% in REM.  During the titration period of 216.2 minutes, the patient slept for 146.5 minutes in REM and nonREM, yielding a sleep efficiency of 67.8%. Sleep onset after CPAP initiation was 4.2 minutes with a REM latency of 28.5 minutes. The patient spent 7.2% of the night in stage N1 sleep, 64.2% in stage N2 sleep, 0.0% in stage N3 and 28.7% in REM.  CARDIAC DATA The 2 lead EKG demonstrated pacemaker generated. The mean heart rate was 100.0 beats per minute. Other EKG findings include: None.  LEG MOVEMENT DATA The total Periodic Limb Movements of Sleep (PLMS) were 0. The PLMS index was 0.0 .  IMPRESSIONS - Severe obstructive sleep apnea occurred during the diagnostic portion of the study (AHI = 34.1/hour). An optimal PAP pressure could not be selected for this patient due to ongoing respiratory events - Severe oxygen desaturation was noted during the diagnostic portion of the study (Min O2 = 82.0%). - The patient snored with moderate snoring volume during the diagnostic portion of the study. - No cardiac abnormalities were noted during this study. - Clinically significant periodic limb movements did not occur during sleep.  DIAGNOSIS - Obstructive Sleep Apnea (G47.33) - Nocturnal Hypoxemia (G47.36) - Unsuccessful CPAP titration due to ongoing respiratory events.   RECOMMENDATIONS - Recommend in lab full night BiPAP titration. - Avoid alcohol, sedatives and other CNS depressants that may worsen sleep apnea and disrupt normal sleep architecture. - Sleep hygiene should be reviewed to assess factors that may improve sleep quality. - Weight management and regular exercise should be initiated or continued.  [Electronically signed] 12/11/2020 04:09 PM  Bralon Antkowiak  Larina Lieurance MD, ABSM Diplomate, American Board of Sleep Medicine

## 2020-12-16 DIAGNOSIS — M7661 Achilles tendinitis, right leg: Secondary | ICD-10-CM | POA: Diagnosis not present

## 2020-12-16 DIAGNOSIS — M533 Sacrococcygeal disorders, not elsewhere classified: Secondary | ICD-10-CM | POA: Diagnosis not present

## 2020-12-17 ENCOUNTER — Telehealth (HOSPITAL_COMMUNITY): Payer: PPO | Admitting: Psychiatry

## 2020-12-21 ENCOUNTER — Telehealth: Payer: Self-pay | Admitting: *Deleted

## 2020-12-21 DIAGNOSIS — G4733 Obstructive sleep apnea (adult) (pediatric): Secondary | ICD-10-CM

## 2020-12-21 NOTE — Telephone Encounter (Signed)
The patient has been notified of the result and verbalized understanding.  All questions (if any) were answered. Marolyn Hammock, [place] 12/21/2020 1:49 PM    Prior Authorization for Bipap titration sent to [facility] via Fax [phone]

## 2020-12-21 NOTE — Telephone Encounter (Signed)
-----   Message from Sueanne Margarita, MD sent at 12/11/2020  4:11 PM EDT ----- Please let patient know that she has severe OSA but had unsuccessful CPAP titration due to severity of OSA>  Please set up for In lab BIPAP titration

## 2020-12-22 ENCOUNTER — Ambulatory Visit (INDEPENDENT_AMBULATORY_CARE_PROVIDER_SITE_OTHER): Payer: PPO | Admitting: Psychiatry

## 2020-12-22 ENCOUNTER — Encounter (HOSPITAL_COMMUNITY): Payer: Self-pay | Admitting: Psychiatry

## 2020-12-22 VITALS — BP 148/78 | HR 69 | Temp 97.9°F | Ht 65.0 in | Wt 264.0 lb

## 2020-12-22 DIAGNOSIS — F5102 Adjustment insomnia: Secondary | ICD-10-CM | POA: Diagnosis not present

## 2020-12-22 DIAGNOSIS — F3181 Bipolar II disorder: Secondary | ICD-10-CM

## 2020-12-22 MED ORDER — LAMOTRIGINE 25 MG PO TABS: 75.0000 mg | ORAL_TABLET | Freq: Every day | ORAL | 0 refills | Status: DC

## 2020-12-22 NOTE — Progress Notes (Signed)
[place] Follow up visit   Patient Identification: Mary Mcfarland MRN:  [id number] Date of Evaluation:  12/22/2020 Referral Source: primary care Chief Complaint:  depression follow up  Visit Diagnosis:    ICD-10-CM   1. Bipolar 2 disorder (HCC)  F31.81     2. Adjustment insomnia  F51.02          History of Present Illness: Patient is a 65 years old currently single Caucasian female initially referred by primary care physician and cardiology for management and establishment of depression She has been seeing a psychiatrist for many years he closed the practice and she started seeing a psychiatrist at [facility] this year till they closed.    Feels edgy, poor sleep has gone sleep study done, waiting for bipap titration Lamical has helped some But gets edgy with poor sleep Planning to do swimming   Aggravating factors; health issues including cardiac difficult year Modifying factors; grand kids    Patient denies drug use or alcohol use     Past Psychiatric History: Depression, possible bipolar  Previous Psychotropic Medications: Yes     Past Medical History:  Past Medical History:  Diagnosis Date   Asthma    Atrial fibrillation (HCC)    Back pain    Bipolar disorder (HCC)    Constipation    Depression    Difficult intubation    Narrow airway   Heartburn    High cholesterol    Hypertension    Longstanding persistent atrial fibrillation (HCC)    Palpitations    Pinched vertebral nerve    sciatica   Prediabetes    Sleep apnea    sleep study was "inconclusive" per patient   SOB (shortness of breath)    Swelling of both lower extremities    Visit for monitoring Tikosyn therapy 08/27/2019   Vitamin D deficiency     Past Surgical History:  Procedure Laterality Date   bone spur surgery     CARDIOVERSION N/A 07/24/2019   Procedure: CARDIOVERSION;  Surgeon: Buford Dresser, MD;  Location: [place];  Service: Cardiovascular;  Laterality: N/A;    COLONOSCOPY     FOOT SURGERY  2017   HEEL SPUR RESECTION Left 03/15/2016   Procedure: ACHILLIES TENDON REPAIR AND PUMP BUMP EXCISION;  Surgeon: Melrose Nakayama, MD;  Location: [place];  Service: Orthopedics;  Laterality: Left;   PACEMAKER IMPLANT N/A 02/06/2020   Procedure: PACEMAKER IMPLANT;  Surgeon: Thompson Grayer, MD;  Location: [place] [facility];  Service: Cardiovascular;  Laterality: N/A;   SHOULDER SURGERY  2010   TOE SURGERY  2011   TONSILLECTOMY      Family Psychiatric History: Mom diagnosed with possible bipolar  Family History:  Family History  Problem Relation Age of Onset   Heart disease Mother    CVA Mother    Hypertension Mother    Cancer Mother    Stroke Mother    Depression Mother    Bipolar disorder Mother    Obesity Mother    Heart disease Father    Hypertension Father    Heart attack Father    Diabetes Father    Liver disease Father    Alcoholism Father    Drug abuse Father    Obesity Father     Social History:   Social History   Socioeconomic History   Marital status: Divorced    Spouse name: Not on file   Number of children: Not on file   Years of education: Not on file  Highest education level: Not on file  Occupational History   Occupation: retired  Tobacco Use   Smoking status: Former    Packs/day: 1.00    Pack years: 0.00    Types: Cigarettes    Quit date: 02/13/1996    Years since quitting: 24.8   Smokeless tobacco: Never  Vaping Use   Vaping Use: Never used  Substance and Sexual Activity   Alcohol use: Yes    Alcohol/week: 3.0 - 4.0 standard drinks    Types: 2 Shots of liquor, 1 - 2 Standard drinks or equivalent per week    Comment: occassionally   Drug use: Never   Sexual activity: Not on file  Other Topics Concern   Not on file  Social History Narrative   Lives in [place] alone   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)   Social Determinants of Health   Financial Resource Strain: Not on file  Food Insecurity: Not on file   Transportation Needs: Not on file  Physical Activity: Not on file  Stress: Not on file  Social Connections: Not on file     Allergies:   Allergies  Allergen Reactions   Niacin Hives    Metabolic Disorder Labs: Lab Results  Component Value Date   HGBA1C 6.1 (H) 01/14/2020   No results found for: PROLACTIN Lab Results  Component Value Date   CHOL 168 01/14/2020   TRIG 78 01/14/2020   HDL 61 01/14/2020   CHOLHDL 3.1 11/13/2019   LDLCALC 92 01/14/2020   LDLCALC 101 (H) 11/13/2019   Lab Results  Component Value Date   TSH 3.610 01/14/2020    Therapeutic Level Labs: No results found for: LITHIUM No results found for: CBMZ No results found for: VALPROATE  Current Medications: See chart    Psychiatric Specialty Exam: Review of Systems  Cardiovascular:  Negative for chest pain.  Psychiatric/Behavioral:  Positive for sleep disturbance. Negative for hallucinations.    Blood pressure (!) 148/78, pulse 69, temperature 97.9 F (36.6 C), height 5\' 5"  (1.651 m), weight 264 lb (119.7 kg), last menstrual period 09/24/2013, SpO2 98 %.Body mass index is 43.93 kg/m.  General Appearance: Casual  Eye Contact:  Fair  Speech:  Normal Rate  Volume:  Normal  Mood: gets edgy  Affect:  Congruent  Thought Process:  Goal Directed  Orientation:  Full (Time, Place, and Person)  Thought Content:  Rumination  Suicidal Thoughts:  No  Homicidal Thoughts:  No  Memory:  Immediate;   Fair Recent;   Fair  Judgement:  Fair  Insight:  Shallow  Psychomotor Activity:  decreased  Concentration:  Concentration: Fair and Attention Span: Fair  Recall:  [employer] of Knowledge:Fair  Language: Good  Akathisia:  No  Handed:   AIMS (if indicated):  not done  Assets:  Desire for Improvement Housing Social Support  ADL's:  Intact  Cognition: WNL  Sleep:   variable   Screenings: PHQ2-9    [place] Office Visit from 01/14/2020 in [place]  PHQ-2 Total Score 6   PHQ-9 Total Score 14      [place] Office Visit from 12/22/2020 in [place] Video Visit from 09/18/2020 in [place] No Risk No Risk       Assessment and Plan: as follows   Bipolar 2 ; depressed ; gets edgy, increase lamicatl to 75mg  no rash can increase more if needed Pending bipap use if  it would help sleep No rash. Has wellbutrin. Will not increase for now, report earlier if needed Insomnia: poor, getting bipap titration, reviewed sleep hygiene    Fu 30m. In office  Merian Capron, MD 7/12/20223:23 PM

## 2020-12-23 DIAGNOSIS — N760 Acute vaginitis: Secondary | ICD-10-CM | POA: Diagnosis not present

## 2020-12-24 ENCOUNTER — Ambulatory Visit (HOSPITAL_COMMUNITY): Payer: PPO | Admitting: Licensed Clinical Social Worker

## 2020-12-24 NOTE — Progress Notes (Signed)
Therapist contacted patient through My Chart for a session and she did not respond. Session is a no show

## 2020-12-29 DIAGNOSIS — I4891 Unspecified atrial fibrillation: Secondary | ICD-10-CM | POA: Diagnosis not present

## 2020-12-29 DIAGNOSIS — G4700 Insomnia, unspecified: Secondary | ICD-10-CM | POA: Diagnosis not present

## 2020-12-29 DIAGNOSIS — J4520 Mild intermittent asthma, uncomplicated: Secondary | ICD-10-CM | POA: Diagnosis not present

## 2020-12-29 DIAGNOSIS — K219 Gastro-esophageal reflux disease without esophagitis: Secondary | ICD-10-CM | POA: Diagnosis not present

## 2020-12-29 DIAGNOSIS — E782 Mixed hyperlipidemia: Secondary | ICD-10-CM | POA: Diagnosis not present

## 2020-12-29 DIAGNOSIS — F3181 Bipolar II disorder: Secondary | ICD-10-CM | POA: Diagnosis not present

## 2020-12-29 DIAGNOSIS — F331 Major depressive disorder, recurrent, moderate: Secondary | ICD-10-CM | POA: Diagnosis not present

## 2020-12-29 DIAGNOSIS — I10 Essential (primary) hypertension: Secondary | ICD-10-CM | POA: Diagnosis not present

## 2020-12-30 ENCOUNTER — Telehealth (HOSPITAL_COMMUNITY): Payer: Self-pay

## 2020-12-30 NOTE — Telephone Encounter (Signed)
Can you please call patient when you have time and explain to her what dosage of Lamictal you want her to take. It was her understanding that it would be 100mg .   CB# [phone]

## 2020-12-31 ENCOUNTER — Other Ambulatory Visit: Payer: Self-pay | Admitting: *Deleted

## 2020-12-31 MED ORDER — LAMOTRIGINE 25 MG PO TABS: 100.0000 mg | ORAL_TABLET | Freq: Every day | ORAL | 0 refills | Status: DC

## 2020-12-31 NOTE — Progress Notes (Unsigned)
Dose change 100 mg per provider

## 2021-01-12 ENCOUNTER — Ambulatory Visit (HOSPITAL_BASED_OUTPATIENT_CLINIC_OR_DEPARTMENT_OTHER): Payer: PPO | Admitting: Cardiology

## 2021-01-12 ENCOUNTER — Other Ambulatory Visit: Payer: Self-pay

## 2021-01-12 VITALS — BP 112/60 | HR 93 | Ht 65.0 in | Wt 271.6 lb

## 2021-01-12 DIAGNOSIS — D6869 Other thrombophilia: Secondary | ICD-10-CM

## 2021-01-12 DIAGNOSIS — Z7901 Long term (current) use of anticoagulants: Secondary | ICD-10-CM | POA: Diagnosis not present

## 2021-01-12 DIAGNOSIS — R0600 Dyspnea, unspecified: Secondary | ICD-10-CM | POA: Diagnosis not present

## 2021-01-12 DIAGNOSIS — Z79899 Other long term (current) drug therapy: Secondary | ICD-10-CM | POA: Diagnosis not present

## 2021-01-12 DIAGNOSIS — I480 Paroxysmal atrial fibrillation: Secondary | ICD-10-CM

## 2021-01-12 DIAGNOSIS — R0609 Other forms of dyspnea: Secondary | ICD-10-CM

## 2021-01-12 NOTE — Patient Instructions (Addendum)
Medication Instructions:  Your Physician recommend you continue on your current medication as directed.    *If you need a refill on your cardiac medications before your next appointment, please call your pharmacy*   Lab Work: Your physician recommends that you return for lab work on 01/12/21 @ 2:30 at [place]   If you have labs (blood work) drawn today and your tests are completely normal, you will receive your results only by: MyChart Message (if you have MyChart) OR A paper copy in the mail If you have any lab test that is abnormal or we need to change your treatment, we will call you to review the results.   Testing/Procedures: None ordered today   Follow-Up: At [facility], you and your health needs are our priority.  As part of our continuing mission to provide you with exceptional heart care, we have created designated Provider Care Teams.  These Care Teams include your primary Cardiologist (physician) and Advanced Practice Providers (APPs -  Physician Assistants and Nurse Practitioners) who all work together to provide you with the care you need, when you need it.  We recommend signing up for the patient portal called "MyChart".  Sign up information is provided on this After Visit Summary.  MyChart is used to connect with patients for Virtual Visits (Telemedicine).  Patients are able to view lab/test results, encounter notes, upcoming appointments, etc.  Non-urgent messages can be sent to your provider as well.   To learn more about what you can do with MyChart, go to [email or web address].    Your next appointment:   6 month(s)  The format for your next appointment:   In Person  Provider:   Buford Dresser, MD

## 2021-01-12 NOTE — Progress Notes (Signed)
Cardiology Office Note:    Date:  01/13/2021   ID:  Mary Mcfarland, DOB 05-Sep-1955, MRN [id number]  PCP:  Lawerance Cruel, MD  Cardiologist:  Buford Dresser, MD  EP: Dr. Rayann Heman  Referring MD: Lawerance Cruel, MD   CC: follow up  History of Present Illness:    Mary Mcfarland is a 65 y.o. female with a hx of atrial fibrillation, sinus pauses now s/p PPM, hypertension, hyperlipidemia, obesity who is seen for follow up today. I initially met her 12/30/19.  Today: Overall, she is not feeling well. Lately she has been having occipital  headaches. She is also constantly short of breath and fatigued.  After carrying groceries into her home today, she needed to wait 15 minutes to catch her breath after sitting down. Additionally, she has mild ankle edema.  She has completed her sleep study, which revealed severe sleep apnea. She was told she now needs a BiPAP study and has not been contacted yet for scheduling.   Also, she now has a smartwatch to help monitor for Afib. She is scheduled to visit the Afib clinic 02/02/2021.  She denies any palpitations, chest pain, lightheadedness, syncope, orthopnea, or PND.   Past Medical History:  Diagnosis Date   Asthma    Atrial fibrillation (HCC)    Back pain    Bipolar disorder (HCC)    Constipation    Depression    Difficult intubation    Narrow airway   Heartburn    High cholesterol    Hypertension    Longstanding persistent atrial fibrillation (HCC)    Palpitations    Pinched vertebral nerve    sciatica   Prediabetes    Sleep apnea    sleep study was "inconclusive" per patient   SOB (shortness of breath)    Swelling of both lower extremities    Visit for monitoring Tikosyn therapy 08/27/2019   Vitamin D deficiency     Past Surgical History:  Procedure Laterality Date   bone spur surgery     CARDIOVERSION N/A 07/24/2019   Procedure: CARDIOVERSION;  Surgeon: Buford Dresser, MD;  Location: [place];  Service: Cardiovascular;  Laterality: N/A;   COLONOSCOPY     FOOT SURGERY  2017   HEEL SPUR RESECTION Left 03/15/2016   Procedure: ACHILLIES TENDON REPAIR AND PUMP BUMP EXCISION;  Surgeon: Melrose Nakayama, MD;  Location: [place];  Service: Orthopedics;  Laterality: Left;   PACEMAKER IMPLANT N/A 02/06/2020   Procedure: PACEMAKER IMPLANT;  Surgeon: Thompson Grayer, MD;  Location: [place] [facility];  Service: Cardiovascular;  Laterality: N/A;   SHOULDER SURGERY  2010   TOE SURGERY  2011   TONSILLECTOMY      Current Medications: (ambmed) Current Outpatient Medications on File Prior to Visit  Medication Sig   albuterol (PROVENTIL HFA;VENTOLIN HFA) 108 (90 Base) MCG/ACT inhaler Inhale 2 puffs into the lungs every 6 (six) hours as needed for wheezing or shortness of breath.   apixaban (ELIQUIS) 5 MG TABS tablet Take 1 tablet (5 mg total) by mouth 2 (two) times daily.   atorvastatin (LIPITOR) 40 MG tablet Take 1 tablet (40 mg total) by mouth daily.   buPROPion (WELLBUTRIN SR) 150 MG 12 hr tablet Take 300 mg by mouth daily.   diltiazem (CARDIZEM CD) 120 MG 24 hr capsule TAKE 1 CAPSULE BY MOUTH ONCE DAILY STOP METOPROLOL    dofetilide (TIKOSYN) 250 MCG capsule Take 1 capsule (250 mcg total) by mouth 2 (two) times daily.  furosemide (LASIX) 40 MG tablet Take 1 tablet (40 mg total) by mouth 2 (two) times daily. Take afternoon dose only as needed.   ibuprofen (ADVIL) 800 MG tablet Take 800 mg by mouth every 8 (eight) hours as needed for moderate pain.    lisinopril (ZESTRIL) 20 MG tablet Take 1 tablet by mouth once daily   Naphazoline-Pheniramine (OPCON-A) 0.027-0.315 % SOLN Place 1-2 drops into both eyes 3 (three) times daily as needed (for allergies).    omeprazole (PRILOSEC) 20 MG capsule Take 20 mg by mouth daily before breakfast.    Allergies:   Niacin   Social History   Tobacco Use   Smoking status: Former    Packs/day: 1.00    Types: Cigarettes    Quit date: 02/13/1996    Years  since quitting: 24.9   Smokeless tobacco: Never  Vaping Use   Vaping Use: Never used  Substance Use Topics   Alcohol use: Yes    Alcohol/week: 3.0 - 4.0 standard drinks    Types: 2 Shots of liquor, 1 - 2 Standard drinks or equivalent per week    Comment: occassionally   Drug use: Never    Family History: family history includes Alcoholism in her father; Bipolar disorder in her mother; CVA in her mother; Cancer in her mother; Depression in her mother; Diabetes in her father; Drug abuse in her father; Heart attack in her father; Heart disease in her father and mother; Hypertension in her father and mother; Liver disease in her father; Obesity in her father and mother; Stroke in her mother.  ROS:   Please see the history of present illness.   (+) Headaches (+) Shortness of breath (+) Fatigue (+) LE edema, bilateral feet Additional pertinent ROS otherwise unremarkable.  EKGs/Labs/Other Studies Reviewed:    The following studies were reviewed today:  Lexiscan Myoview 02/28/2020: Lexiscan stress is electrically negative for ischemia Myovue scan shows normal perfusion LVEf 67% This is a low risk study.  Pacemaker Implant 02/06/2020:  1. Successful implantation of a St Jude Medical Assurity MRI conditional  dual-chamber pacemaker for symptomatic sinus bradycardia  2. No early apparent complications.     Monitor 01/15/2020: Sinus rhythm Episodes of daytime sinus bradycardia, accelerated idioventricular rhythm, and sinus pauses are noted Frequent premature atrial contractions and nonsustained atrial tachycardia are noted  -Echo 06/2019: EF 60-65%, indeterminate DF, no significant valve disease. LA diameter 4.5 cm -Ca score 06/2019: Ca score 22. Reviewed what this means -attempted CT coronary 12/18/19, highly variable heart rate, test cancelled.  EKG:  EKG is personally reviewed.   01/12/2021: NSR with 1st degree AV block, RBBB, occ v pacing 10/29/2020: SR, frequent pacing, RBBB 02/25/20:  atrial paced rhythm, RBBB, 70 bpm  Recent Labs: 01/14/2020: ALT 22; TSH 3.610 01/21/2020: Hemoglobin 13.5; Platelets 382 05/18/2020: BUN 17; Creatinine, Ser 0.85; Magnesium 1.8; Potassium 3.6; Sodium 144  Recent Lipid Panel    Component Value Date/Time   CHOL 168 01/14/2020 1212   TRIG 78 01/14/2020 1212   HDL 61 01/14/2020 1212   CHOLHDL 3.1 11/13/2019 0843   LDLCALC 92 01/14/2020 1212    Physical Exam:    VS:  BP 112/60   Pulse 93   Ht $R'5\' 5"'oK$  (1.651 m)   Wt 271 lb 9.6 oz (123.2 kg)   LMP 09/24/2013   BMI 45.20 kg/m     Wt Readings from Last 3 Encounters:  01/12/21 271 lb 9.6 oz (123.2 kg)  12/10/20 263 lb (119.3 kg)  10/29/20 265 lb (  120.2 kg)    GEN: Well nourished, well developed in no acute distress HEENT: Normal, moist mucous membranes NECK: No JVD CARDIAC: regular rhythm, normal S1 and S2, no rubs or gallops. No murmur. VASCULAR: Radial and DP pulses 2+ bilaterally. No carotid bruits RESPIRATORY:  Clear to auscultation without rales, wheezing or rhonchi  ABDOMEN: Soft, non-tender, non-distended MUSCULOSKELETAL:  Ambulates independently SKIN: Warm and dry, trace bilateral ankle edema NEUROLOGIC:  Alert and oriented x 3. No focal neuro deficits noted. PSYCHIATRIC:  Normal affect   ASSESSMENT:    1. Paroxysmal atrial fibrillation (HCC)   2. Medication management   3. DOE (dyspnea on exertion)   4. Secondary hypercoagulable state ([place])   5. Long term current use of anticoagulant   6. Encounter for long-term current use of high risk medication     PLAN:    Fatigue, dyspnea on exertion, lower extremity edema: -she has had recent echo, monitor, and pacemaker -nuclear stress test reassuring -she thought she might be in afib as etiology, but she is in sinus today -suggests likely noncardiac cause of symptoms. Will check BMET, Mg, CBC to rule out other causes. Recommend she continue to discuss with her PCP   Atrial fibrillation, paroxysmal: -now paroxysmal on  dofetilide. Ordered monitoring labs, as above -CHA2DS2/VAS Stroke Risk Points= 2 -continue apixaban -now s/p PPM to avoid conversion pauses   Hypertension: -at goal today -continue lisinopril, furosemide, diltiazem   Morbid obesity: -she is understandably frustrated, limited in her activity level -discussed diet, exercise today   sleep apnea: -I have sent a message to Dr. Radford Pax to see if I can help expedite scheduling for bipap titration   Hyperlipidemia: -continue atorvastatin  Cardiac risk counseling and prevention recommendations: -recommend heart healthy/Mediterranean diet, with whole grains, fruits, vegetable, fish, lean meats, nuts, and olive oil. Limit salt. -recommend moderate walking, 3-5 times/week for 30-50 minutes each session. Aim for at least 150 minutes.week. Goal should be pace of 3 miles/hours, or walking 1.5 miles in 30 minutes -recommend avoidance of tobacco products. Avoid excess alcohol. -ASCVD risk score: The 10-year ASCVD risk score Mikey Bussing DC Brooke Bonito., et al., 2013) is: 4%   Values used to calculate the score:     Age: 80 years     Sex: Female     Is Non-Hispanic African American: No     Diabetic: No     Tobacco smoker: No     Systolic Blood Pressure: 606 mmHg     Is BP treated: Yes     HDL Cholesterol: 61 mg/dL     Total Cholesterol: 168 mg/dL    Plan for follow up: 6 months or sooner as needed  Buford Dresser, MD, PhD [place]  [facility]    Medication Adjustments/Labs and Tests Ordered: Current medicines are reviewed at length with the patient today.  Concerns regarding medicines are outlined above.  Orders Placed This Encounter  Procedures   CBC   Basic metabolic panel   Magnesium   EKG 12-Lead    No orders of the defined types were placed in this encounter.  Patient Instructions  Medication Instructions:  Your Physician recommend you continue on your current medication as directed.    *If you need a refill on your cardiac  medications before your next appointment, please call your pharmacy*   Lab Work: Your physician recommends that you return for lab work on 01/12/21 @ 2:30 at [place] [address]  BMP CBC Mg   If you have  labs (blood work) drawn today and your tests are completely normal, you will receive your results only by: [place] (if you have MyChart) OR A paper copy in the mail If you have any lab test that is abnormal or we need to change your treatment, we will call you to review the results.   Testing/Procedures: None ordered today   Follow-Up: At [facility], you and your health needs are our priority.  As part of our continuing mission to provide you with exceptional heart care, we have created designated Provider Care Teams.  These Care Teams include your primary Cardiologist (physician) and Advanced Practice Providers (APPs -  Physician Assistants and Nurse Practitioners) who all work together to provide you with the care you need, when you need it.  We recommend signing up for the patient portal called "MyChart".  Sign up information is provided on this After Visit Summary.  MyChart is used to connect with patients for Virtual Visits (Telemedicine).  Patients are able to view lab/test results, encounter notes, upcoming appointments, etc.  Non-urgent messages can be sent to your provider as well.   To learn more about what you can do with MyChart, go to [email or web address].    Your next appointment:   6 month(s)  The format for your next appointment:   In Person  Provider:   Buford Dresser, MD      [facility] Stump[facility],acting as a scribe for Buford Dresser, MD.,have documented all relevant documentation on the behalf of Buford Dresser, MD,as directed by  Buford Dresser, MD while in the presence of Buford Dresser, MD.  I, Buford Dresser, MD, have reviewed all documentation for this visit. The documentation on 01/13/21  for the exam, diagnosis, procedures, and orders are all accurate and complete.  Signed, Buford Dresser, MD PhD 01/13/2021     [facility]

## 2021-01-13 ENCOUNTER — Other Ambulatory Visit: Payer: PPO

## 2021-01-13 ENCOUNTER — Encounter (HOSPITAL_BASED_OUTPATIENT_CLINIC_OR_DEPARTMENT_OTHER): Payer: Self-pay | Admitting: Cardiology

## 2021-01-13 ENCOUNTER — Encounter (HOSPITAL_BASED_OUTPATIENT_CLINIC_OR_DEPARTMENT_OTHER): Payer: Self-pay

## 2021-01-13 DIAGNOSIS — Z79899 Other long term (current) drug therapy: Secondary | ICD-10-CM | POA: Diagnosis not present

## 2021-01-13 NOTE — Telephone Encounter (Signed)
Ok to schedule Bipap titration ,valid dates are 01-11-21- 04-11-21 aut [id number].

## 2021-01-14 ENCOUNTER — Encounter (HOSPITAL_BASED_OUTPATIENT_CLINIC_OR_DEPARTMENT_OTHER): Payer: Self-pay

## 2021-01-14 LAB — BASIC METABOLIC PANEL
BUN/Creatinine Ratio: 29 — ABNORMAL HIGH (ref 12–28)
BUN: 28 mg/dL — ABNORMAL HIGH (ref 8–27)
CO2: 22 mmol/L (ref 20–29)
Calcium: 9.5 mg/dL (ref 8.7–10.3)
Chloride: 102 mmol/L (ref 96–106)
Creatinine, Ser: 0.98 mg/dL (ref 0.57–1.00)
Glucose: 123 mg/dL — ABNORMAL HIGH (ref 65–99)
Potassium: 4.2 mmol/L (ref 3.5–5.2)
Sodium: 141 mmol/L (ref 134–144)
eGFR: 65 mL/min/{1.73_m2} (ref >59)

## 2021-01-14 LAB — CBC
Hematocrit: 37.6 % (ref 34.0–46.6)
Hemoglobin: 12.1 g/dL (ref 11.1–15.9)
MCH: 28 pg (ref 26.6–33.0)
MCHC: 32.2 g/dL (ref 31.5–35.7)
MCV: 87 fL (ref 79–97)
Platelets: 350 10*3/uL (ref 150–450)
RBC: 4.32 x10E6/uL (ref 3.77–5.28)
RDW: 13.4 % (ref 11.7–15.4)
WBC: 11.7 10*3/uL — ABNORMAL HIGH (ref 3.4–10.8)

## 2021-01-14 LAB — MAGNESIUM: Magnesium: 1.9 mg/dL (ref 1.6–2.3)

## 2021-01-19 NOTE — Telephone Encounter (Signed)
Patient is scheduled for BiPAP Titration on 02/03/21. Patient understands her titration study will be done at [facility] sleep lab. Patient understands she will receive a letter in a week or so detailing appointment, date, time, and location. Patient understands to call if he does not receive the letter  in a timely manner. Patient agrees with treatment and thanked me for call.

## 2021-01-21 DIAGNOSIS — G4700 Insomnia, unspecified: Secondary | ICD-10-CM | POA: Diagnosis not present

## 2021-01-21 DIAGNOSIS — I10 Essential (primary) hypertension: Secondary | ICD-10-CM | POA: Diagnosis not present

## 2021-01-21 DIAGNOSIS — R42 Dizziness and giddiness: Secondary | ICD-10-CM | POA: Diagnosis not present

## 2021-01-21 DIAGNOSIS — D179 Benign lipomatous neoplasm, unspecified: Secondary | ICD-10-CM | POA: Diagnosis not present

## 2021-01-25 ENCOUNTER — Other Ambulatory Visit (HOSPITAL_COMMUNITY): Payer: Self-pay

## 2021-01-25 MED ORDER — LAMOTRIGINE 25 MG PO TABS: 100.0000 mg | ORAL_TABLET | Freq: Every day | ORAL | 0 refills | Status: DC

## 2021-02-02 ENCOUNTER — Other Ambulatory Visit: Payer: Self-pay

## 2021-02-02 ENCOUNTER — Ambulatory Visit (HOSPITAL_COMMUNITY)
Admission: RE | Admit: 2021-02-02 | Discharge: 2021-02-02 | Disposition: A | Discharge status: Home / Self Care | Payer: PPO | Source: Ambulatory Visit | Attending: Physician Assistant | Admitting: Physician Assistant

## 2021-02-02 ENCOUNTER — Encounter (HOSPITAL_COMMUNITY): Payer: Self-pay | Admitting: Physician Assistant

## 2021-02-02 VITALS — BP 120/60 | HR 86 | Ht 65.0 in | Wt 272.6 lb

## 2021-02-02 DIAGNOSIS — I110 Hypertensive heart disease with heart failure: Secondary | ICD-10-CM | POA: Insufficient documentation

## 2021-02-02 DIAGNOSIS — Z7901 Long term (current) use of anticoagulants: Secondary | ICD-10-CM | POA: Insufficient documentation

## 2021-02-02 DIAGNOSIS — I4819 Other persistent atrial fibrillation: Secondary | ICD-10-CM | POA: Insufficient documentation

## 2021-02-02 DIAGNOSIS — Z87891 Personal history of nicotine dependence: Secondary | ICD-10-CM | POA: Insufficient documentation

## 2021-02-02 DIAGNOSIS — I484 Atypical atrial flutter: Secondary | ICD-10-CM | POA: Diagnosis not present

## 2021-02-02 DIAGNOSIS — Z8249 Family history of ischemic heart disease and other diseases of the circulatory system: Secondary | ICD-10-CM | POA: Insufficient documentation

## 2021-02-02 DIAGNOSIS — I5032 Chronic diastolic (congestive) heart failure: Secondary | ICD-10-CM | POA: Insufficient documentation

## 2021-02-02 DIAGNOSIS — Z79899 Other long term (current) drug therapy: Secondary | ICD-10-CM | POA: Insufficient documentation

## 2021-02-02 DIAGNOSIS — G4733 Obstructive sleep apnea (adult) (pediatric): Secondary | ICD-10-CM | POA: Insufficient documentation

## 2021-02-02 DIAGNOSIS — Z6841 Body Mass Index (BMI) 40.0 and over, adult: Secondary | ICD-10-CM | POA: Diagnosis not present

## 2021-02-02 DIAGNOSIS — Z09 Encounter for follow-up examination after completed treatment for conditions other than malignant neoplasm: Secondary | ICD-10-CM | POA: Insufficient documentation

## 2021-02-02 DIAGNOSIS — E669 Obesity, unspecified: Secondary | ICD-10-CM | POA: Diagnosis not present

## 2021-02-02 NOTE — Progress Notes (Signed)
Primary Care Physician: Lawerance Cruel, MD Primary Cardiologist: Dr Harrell Gave  Primary Electrophysiologist: Dr Rayann Heman Referring Physician: Dr [name] is a 65 y.o. female with a history of HTN, bipolar disorder, symptomatic bradycardia s/p PPM, and persistent atrial fibrillation who presents for follow up in the [facility]. She was first diagnosed with atrial fibrillation in 2017 and then recurrence found in January of this year at PCP visit. She went in for evaluation of headaches, dizziness.  She was placed on BB and Eliquis.  She underwent attempted cardioversion in February which failed to restore SR. Patient is on Eliquis for a CHADS2VASC score of 3. She was evaluated by Dr Rayann Heman on 08/09/19 who recommended dofetilide. Patient is s/p dofetilide loading 3/16-3/19/21. She did not require DCCV. Patient was seen at her PCP office on 11/21/19 and found to be bradycardic HR 44 with PACs. She was also noted to have a 3 second pause on rhythm strip. Her BB was stopped. Patient reports that her SOB has improved off of metoprolol. Patient presented for DCCV on 12/06/19 and was found to be in SR, procedure was cancelled.   On follow up today, patient reports that she has done reasonably well since her last visit. Her last device interrogation showed 2.7% afib burden. She continues to feel sleepy/fatigued. She is scheduled for a BiPap titration tomorrow. She denies any bleeding issues on anticoagulation.   Today, she denies symptoms of palpitations, chest pain, orthopnea, PND, presyncope, syncope, bleeding, or neurologic sequela. The patient is tolerating medications without difficulties and is otherwise without complaint today.    Atrial Fibrillation Risk Factors:  she does have symptoms or diagnosis of sleep apnea.  BiPap titration pending she does not have a history of rheumatic fever. she does not have a history of alcohol use.   she has a  BMI of Body mass index is 45.36 kg/m.Marland Kitchen Filed Weights   02/02/21 0931  Weight: 123.7 kg     Family History  Problem Relation Age of Onset   Heart disease Mother    CVA Mother    Hypertension Mother    Cancer Mother    Stroke Mother    Depression Mother    Bipolar disorder Mother    Obesity Mother    Heart disease Father    Hypertension Father    Heart attack Father    Diabetes Father    Liver disease Father    Alcoholism Father    Drug abuse Father    Obesity Father      Atrial Fibrillation Management history:  Previous antiarrhythmic drugs: dofetilide Previous cardioversions: 07/24/19 Previous ablations: none CHADS2VASC score: 2 Anticoagulation history: Eliquis   Past Medical History:  Diagnosis Date   Asthma    Atrial fibrillation (HCC)    Back pain    Bipolar disorder (HCC)    Constipation    Depression    Difficult intubation    Narrow airway   Heartburn    High cholesterol    Hypertension    Longstanding persistent atrial fibrillation (HCC)    Palpitations    Pinched vertebral nerve    sciatica   Prediabetes    Sleep apnea    sleep study was "inconclusive" per patient   SOB (shortness of breath)    Swelling of both lower extremities    Visit for monitoring Tikosyn therapy 08/27/2019   Vitamin D deficiency    Past Surgical History:  Procedure Laterality Date  bone spur surgery     CARDIOVERSION N/A 07/24/2019   Procedure: CARDIOVERSION;  Surgeon: Buford Dresser, MD;  Location: [facility];  Service: Cardiovascular;  Laterality: N/A;   COLONOSCOPY     FOOT SURGERY  2017   HEEL SPUR RESECTION Left 03/15/2016   Procedure: ACHILLIES TENDON REPAIR AND PUMP BUMP EXCISION;  Surgeon: Melrose Nakayama, MD;  Location: [facility];  Service: Orthopedics;  Laterality: Left;   PACEMAKER IMPLANT N/A 02/06/2020   Procedure: PACEMAKER IMPLANT;  Surgeon: Thompson Grayer, MD;  Location: [facility];  Service: Cardiovascular;  Laterality: N/A;   SHOULDER  SURGERY  2010   TOE SURGERY  2011   TONSILLECTOMY      Current Outpatient Medications  Medication Sig Dispense Refill   albuterol (PROVENTIL HFA;VENTOLIN HFA) 108 (90 Base) MCG/ACT inhaler Inhale 2 puffs into the lungs every 6 (six) hours as needed for wheezing or shortness of breath.     atorvastatin (LIPITOR) 40 MG tablet Take 1 tablet (40 mg total) by mouth daily. 90 tablet 3   buPROPion (WELLBUTRIN SR) 150 MG 12 hr tablet Take 300 mg by mouth daily.     diltiazem (CARDIZEM CD) 120 MG 24 hr capsule Take 120 mg by mouth daily.     dofetilide (TIKOSYN) 250 MCG capsule Take 1 capsule (250 mcg total) by mouth 2 (two) times daily. 60 capsule 11   ELIQUIS 5 MG TABS tablet TAKE ONE TABLET BY MOUTH TWICE DAILY 180 tablet 1   furosemide (LASIX) 40 MG tablet Take 1 tablet (40 mg total) by mouth 2 (two) times daily. Take afternoon dose only as needed. 180 tablet 3   lamoTRIgine (LAMICTAL) 25 MG tablet Take 4 tablets (100 mg total) by mouth daily. Refill when due, dose increased 360 tablet 0   Levocetirizine Dihydrochloride (XYZAL PO) Take 1 tablet by mouth daily.     lisinopril (ZESTRIL) 10 MG tablet Take 10 mg by mouth daily.     Naphazoline-Pheniramine (OPCON-A) 0.027-0.315 % SOLN Place 1-2 drops into both eyes 3 (three) times daily as needed (for allergies).      omeprazole (PRILOSEC) 20 MG capsule Take 20 mg by mouth daily before breakfast.      valACYclovir (VALTREX) 1000 MG tablet Take 1 tablet (1,000 mg total) by mouth 3 (three) times daily. 21 tablet 0   zolpidem (AMBIEN) 10 MG tablet 1/2-1 tablet at bedtime as needed     No current facility-administered medications for this encounter.    Allergies  Allergen Reactions   Niacin Hives    Social History   Socioeconomic History   Marital status: Divorced    Spouse name: Not on file   Number of children: Not on file   Years of education: Not on file   Highest education level: Not on file  Occupational History   Occupation: retired   Tobacco Use   Smoking status: Former    Packs/day: 1.00    Types: Cigarettes    Quit date: 02/13/1996    Years since quitting: 24.9   Smokeless tobacco: Never   Tobacco comments:    Former smoker 02/02/21  Vaping Use   Vaping Use: Never used  Substance and Sexual Activity   Alcohol use: Yes    Alcohol/week: 1.0 - 2.0 standard drink    Types: 1 - 2 Standard drinks or equivalent per week    Comment: occassionally   Drug use: Never   Sexual activity: Not on file  Other Topics Concern   Not on  file  Social History Narrative   Lives in [place] alone   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  Physical Exam: Vitals:   02/02/21 0931  BP: 120/60  Pulse: 86  Weight: 123.7 kg  Height: '5\' 5"'$  (1.651 m)    GEN- The patient is a well appearing obese female, alert and oriented x 3 today.   HEENT-head normocephalic, atraumatic, sclera clear, conjunctiva pink, hearing intact, trachea midline. Lungs- Clear to ausculation bilaterally, normal work of breathing Heart- Regular rate and rhythm, no murmurs, rubs or gallops  GI- soft, NT, ND, + BS Extremities- no clubbing, cyanosis, or edema MS- no significant deformity or atrophy Skin- no rash or lesion Psych- euthymic mood, full affect Neuro- strength and sensation are intact   Wt Readings from Last 3 Encounters:  02/02/21 123.7 kg  01/12/21 123.2 kg  12/10/20 119.3 kg    EKG today demonstrates  A sense V paced rhythm, occasional dural pacing Vent. rate 86 BPM PR interval 230 ms QRS duration 100 ms QT/QTcB 388/464 ms  [place] 07/01/19 demonstrated  1. Left ventricular ejection fraction,  by visual estimation, is 60 to  65%. The left ventricle has normal function. There is mildly increased  left ventricular hypertrophy.   2. Left ventricular diastolic parameters are indeterminate.   3. The left ventricle has no regional wall motion abnormalities.   4. Global right ventricle has normal systolic function.The right  ventricular size is normal. No increase in right ventricular wall  thickness.   5. Left atrial size was moderately dilated.   6. Right atrial size was normal.   7. The mitral valve is normal in structure. Trivial mitral valve  regurgitation. No evidence of mitral stenosis.   8. The tricuspid valve is normal in structure.   9. The tricuspid valve is normal in structure. Tricuspid valve  regurgitation is not demonstrated.  10. The aortic valve is tricuspid. Aortic valve regurgitation is not  visualized. Mild to moderate aortic valve sclerosis/calcification without  any evidence of aortic stenosis.  11. Calcified non coronary cusp.  12. The pulmonic valve was normal in structure. Pulmonic valve  regurgitation is not visualized.  13. The inferior vena cava is normal in size with greater than 50%  respiratory variability, suggesting right atrial pressure of 3 mmHg.   Epic records are reviewed at length today  CHA2DS2-VASc Score = 2 The patient's score is based upon: CHF History: No HTN History: Yes Age : < 65 Diabetes History: No Stroke History: No Vascular Disease History: No Gender: Female    ASSESSMENT AND PLAN: 1. Persistent Atrial Fibrillation/atypical atrial flutter The patient's CHA2DS2-VASc score is 2, indicating a 2.2% annual risk of stroke.   Patient appears to be maintaining SR with low afib burden. Continue diltiazem 120 mg daily Continue dofetilide 250 mcg BID. QT stable. Continue Eliquis 5 mg BID  2. Obesity Body mass index is 45.36 kg/m. Lifestyle modification was discussed and encouraged including regular physical activity and weight  reduction.  3. Severe OSA Scheduled for BiPap titration 8/24. Suspect this is contributing to her fatigue.   4. HTN Stable, no  changes today.  5. Chronic diastolic CHF No signs or symptoms of fluid overload.  6. Symptomatic bradycardia S/p PPM, followed by Dr Rayann Heman and the device clinic.   Follow up with Dr Rayann Heman in 4 months. AF clinic 6 months later.    [facility] [address] [place],  [address] [phone] 02/02/2021 10:02 AM

## 2021-02-03 ENCOUNTER — Encounter (HOSPITAL_BASED_OUTPATIENT_CLINIC_OR_DEPARTMENT_OTHER): Payer: PPO | Admitting: Cardiology

## 2021-02-08 ENCOUNTER — Encounter (HOSPITAL_COMMUNITY): Payer: Self-pay

## 2021-02-08 ENCOUNTER — Telehealth: Payer: Self-pay

## 2021-02-08 ENCOUNTER — Ambulatory Visit (INDEPENDENT_AMBULATORY_CARE_PROVIDER_SITE_OTHER): Payer: PPO

## 2021-02-08 DIAGNOSIS — I495 Sick sinus syndrome: Secondary | ICD-10-CM

## 2021-02-08 LAB — CUP PACEART REMOTE DEVICE CHECK
Battery Remaining Longevity: 92 mo
Battery Remaining Percentage: 92 %
Battery Voltage: 3.02 V
Brady Statistic AP VP Percent: 39 %
Brady Statistic AP VS Percent: 12 %
Brady Statistic AS VP Percent: 13 %
Brady Statistic AS VS Percent: 21 %
Brady Statistic RA Percent Paced: 45 %
Brady Statistic RV Percent Paced: 50 %
Date Time Interrogation Session: 20220829020014
Implantable Lead Implant Date: 20210826
Implantable Lead Implant Date: 20210826
Implantable Lead Location: 753859
Implantable Lead Location: 753860
Implantable Pulse Generator Implant Date: 20210826
Lead Channel Impedance Value: 440 Ohm
Lead Channel Impedance Value: 450 Ohm
Lead Channel Pacing Threshold Amplitude: 0.75 V
Lead Channel Pacing Threshold Amplitude: 1 V
Lead Channel Pacing Threshold Pulse Width: 0.4 ms
Lead Channel Pacing Threshold Pulse Width: 0.5 ms
Lead Channel Sensing Intrinsic Amplitude: 3.4 mV
Lead Channel Sensing Intrinsic Amplitude: 5 mV
Lead Channel Setting Pacing Amplitude: 2 V
Lead Channel Setting Pacing Amplitude: 2.5 V
Lead Channel Setting Pacing Pulse Width: 0.4 ms
Lead Channel Setting Sensing Sensitivity: 2 mV
Pulse Gen Model: 2272
Pulse Gen Serial Number: 3859348

## 2021-02-08 NOTE — Telephone Encounter (Signed)
The patient thinks she is in A-fib. Transmission received 02/08/2021. I told the patient the nurse will give her a call back.

## 2021-02-08 NOTE — Telephone Encounter (Signed)
Per Adline Peals PA will have pt send another transmission on Friday to see if persistent. Pt normally converts on her own.

## 2021-02-08 NOTE — Telephone Encounter (Signed)
Spoke with pt.  She was not aware of being in AF until she put on her watch this morning and was alerted to irregular HR.    Scheduled [facility]lin transmission shows AF since 02/06/21 ~2pm.  Overall V rates controlled.    Pt has known history of Persistent AF, current meds include Eliquis, Diltiazem '120mg'$  daily, Tikosyn 28mg BID.    Pt reports compliance with meds as ordered.  She denies any current symptoms.    Advised I would forward info to AF clinic.

## 2021-02-12 ENCOUNTER — Ambulatory Visit (HOSPITAL_BASED_OUTPATIENT_CLINIC_OR_DEPARTMENT_OTHER): Payer: PPO | Attending: Cardiology | Admitting: Cardiology

## 2021-02-16 NOTE — Telephone Encounter (Signed)
Patient is back in SR.

## 2021-02-16 NOTE — Telephone Encounter (Signed)
Pt was to send transmission on 9/2.Will await transmission results.

## 2021-02-16 NOTE — Telephone Encounter (Signed)
Pt.notified

## 2021-02-18 ENCOUNTER — Encounter (HOSPITAL_BASED_OUTPATIENT_CLINIC_OR_DEPARTMENT_OTHER): Payer: Self-pay

## 2021-02-19 NOTE — Progress Notes (Signed)
Remote pacemaker transmission.   

## 2021-02-25 ENCOUNTER — Encounter (HOSPITAL_BASED_OUTPATIENT_CLINIC_OR_DEPARTMENT_OTHER): Payer: PPO | Admitting: Internal Medicine

## 2021-02-25 ENCOUNTER — Encounter (HOSPITAL_BASED_OUTPATIENT_CLINIC_OR_DEPARTMENT_OTHER): Payer: Self-pay

## 2021-02-25 DIAGNOSIS — R001 Bradycardia, unspecified: Secondary | ICD-10-CM

## 2021-02-25 DIAGNOSIS — I10 Essential (primary) hypertension: Secondary | ICD-10-CM

## 2021-02-25 DIAGNOSIS — I4819 Other persistent atrial fibrillation: Secondary | ICD-10-CM

## 2021-02-28 ENCOUNTER — Other Ambulatory Visit (HOSPITAL_BASED_OUTPATIENT_CLINIC_OR_DEPARTMENT_OTHER): Payer: Self-pay | Admitting: Cardiology

## 2021-03-01 NOTE — Telephone Encounter (Signed)
Rx(s) sent to pharmacy electronically.  

## 2021-03-02 ENCOUNTER — Encounter (HOSPITAL_COMMUNITY): Payer: Self-pay | Admitting: Psychiatry

## 2021-03-02 ENCOUNTER — Ambulatory Visit (INDEPENDENT_AMBULATORY_CARE_PROVIDER_SITE_OTHER): Payer: PPO | Admitting: Psychiatry

## 2021-03-02 VITALS — BP 130/72 | Temp 97.7°F | Ht 65.0 in | Wt 270.0 lb

## 2021-03-02 DIAGNOSIS — F3181 Bipolar II disorder: Secondary | ICD-10-CM

## 2021-03-02 DIAGNOSIS — F5102 Adjustment insomnia: Secondary | ICD-10-CM

## 2021-03-02 MED ORDER — LAMOTRIGINE 100 MG PO TABS: 100.0000 mg | ORAL_TABLET | Freq: Every day | ORAL | 0 refills | Status: DC

## 2021-03-02 NOTE — Progress Notes (Signed)
[facility] Follow up visit   Patient Identification: Mary Mcfarland MRN:  [id number] Date of Evaluation:  03/02/2021 Referral Source: primary care Chief Complaint:  depression follow up  Visit Diagnosis:    ICD-10-CM   1. Bipolar 2 disorder (HCC)  F31.81     2. Adjustment insomnia  F51.02          History of Present Illness: Patient is a 65 years old currently single Caucasian female initially referred by primary care physician and cardiology for management and establishment of depression She has been seeing a psychiatrist for many years he closed the practice and she started seeing a psychiatrist at [facility] this year till they closed.     Last visit we have increased the Lamictal she is doing somewhat better less edgy and more compliant but apparently still struggling to get a sleep study done she is waiting for a BiPAP study as sleep still affects her mood symptoms during the day  Planning to with his son in [place] tries to keep her self busy in her apartment    Aggravating factors; health issues, including cardiac difficult year Modifying factors; grandkids    Patient denies drug use or alcohol use     Past Psychiatric History: Depression, possible bipolar  Previous Psychotropic Medications: Yes     Past Medical History:  Past Medical History:  Diagnosis Date   Asthma    Atrial fibrillation (HCC)    Back pain    Bipolar disorder ([facility])    Constipation    Depression    Difficult intubation    Narrow airway   Heartburn    High cholesterol    Hypertension    Longstanding persistent atrial fibrillation ([facility])    Palpitations    Pinched vertebral nerve    sciatica   Prediabetes    Sleep apnea    sleep study was "inconclusive" per patient   SOB (shortness of breath)    Swelling of both lower extremities    Visit for monitoring Tikosyn therapy 08/27/2019   Vitamin D deficiency     Past Surgical History:  Procedure Laterality Date   bone spur surgery      CARDIOVERSION N/A 07/24/2019   Procedure: CARDIOVERSION;  Surgeon: Buford Dresser, MD;  Location: [facility];  Service: Cardiovascular;  Laterality: N/A;   COLONOSCOPY     FOOT SURGERY  2017   HEEL SPUR RESECTION Left 03/15/2016   Procedure: ACHILLIES TENDON REPAIR AND PUMP BUMP EXCISION;  Surgeon: Melrose Nakayama, MD;  Location: [facility];  Service: Orthopedics;  Laterality: Left;   PACEMAKER IMPLANT N/A 02/06/2020   Procedure: PACEMAKER IMPLANT;  Surgeon: Thompson Grayer, MD;  Location: [facility];  Service: Cardiovascular;  Laterality: N/A;   SHOULDER SURGERY  2010   TOE SURGERY  2011   TONSILLECTOMY      Family Psychiatric History: Mom diagnosed with possible bipolar  Family History:  Family History  Problem Relation Age of Onset   Heart disease Mother    CVA Mother    Hypertension Mother    Cancer Mother    Stroke Mother    Depression Mother    Bipolar disorder Mother    Obesity Mother    Heart disease Father    Hypertension Father    Heart attack Father    Diabetes Father    Liver disease Father    Alcoholism Father    Drug abuse Father    Obesity Father     Social History:  Social History   Socioeconomic History   Marital status: Divorced    Spouse name: Not on file   Number of children: Not on file   Years of education: Not on file   Highest education level: Not on file  Occupational History   Occupation: retired  Tobacco Use   Smoking status: Former    Packs/day: 1.00    Types: Cigarettes    Quit date: 02/13/1996    Years since quitting: 25.0   Smokeless tobacco: Never   Tobacco comments:    Former smoker 02/02/21  Vaping Use   Vaping Use: Never used  Substance and Sexual Activity   Alcohol use: Yes    Alcohol/week: 1.0 - 2.0 standard drink    Types: 1 - 2 Standard drinks or equivalent per week    Comment: occassionally   Drug use: Never   Sexual activity: Not on file  Other Topics Concern   Not on file  Social History Narrative    Lives in [place] alone   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)   Social Determinants of Health   Financial Resource Strain: Not on file  Food Insecurity: Not on file  Transportation Needs: Not on file  Physical Activity: Not on file  Stress: Not on file  Social Connections: Not on file     Allergies:   Allergies  Allergen Reactions   Niacin Hives    Metabolic Disorder Labs: Lab Results  Component Value Date   HGBA1C 6.1 (H) 01/14/2020   No results found for: PROLACTIN Lab Results  Component Value Date   CHOL 168 01/14/2020   TRIG 78 01/14/2020   HDL 61 01/14/2020   CHOLHDL 3.1 11/13/2019   LDLCALC 92 01/14/2020   LDLCALC 101 (H) 11/13/2019   Lab Results  Component Value Date   TSH 3.610 01/14/2020    Therapeutic Level Labs: No results found for: LITHIUM No results found for: CBMZ No results found for: VALPROATE  Current Medications: See chart    Psychiatric Specialty Exam: Review of Systems  Cardiovascular:  Negative for chest pain.  Psychiatric/Behavioral:  Positive for sleep disturbance. Negative for hallucinations.    Blood pressure 130/72, temperature 97.7 F (36.5 C), height 5\' 5"  (1.651 m), weight 270 lb (122.5 kg), last menstrual period 09/24/2013.Body mass index is 44.93 kg/m.  General Appearance: Casual  Eye Contact:  Fair  Speech:  Normal Rate  Volume:  Normal  Mood: Some better  Affect:  Congruent  Thought Process:  Goal Directed  Orientation:  Full (Time, Place, and Person)  Thought Content:  Rumination  Suicidal Thoughts:  No  Homicidal Thoughts:  No  Memory:  Immediate;   Fair Recent;   Fair  Judgement:  Fair  Insight:  Shallow  Psychomotor Activity:  decreased  Concentration:  Concentration: Fair and Attention Span: Fair  Recall:  [employer] [place]: Good  Akathisia:  No  Handed:   AIMS (if indicated):  not done  Assets:  Desire for Improvement Housing Social Support  ADL's:  Intact  Cognition:  WNL  Sleep:   variable   Screenings: PHQ2-9    [facility] Office Visit from 01/14/2020 in [facility]  PHQ-2 Total Score 6  PHQ-9 Total Score 14      [facility] Office Visit from 03/02/2021 in [facility] Office Visit from 12/22/2020 in [facility] Video Visit from 09/18/2020 in [facility]  C-SSRS RISK CATEGORY No Risk No Risk No Risk       Assessment and Plan: as follows  Prior documentation reviewed Bipolar 2 ; depressed ; gets edgy with some better Lamictal will be 100 mg it can be changed to 1 mg 1 tablet  Sleep concerns; getting BiPAP study done that will probably help with sleep and indirectly her mood symptoms during the day  Denies rash continue take Wellbutrin for depression as well  Insomnia: Poor reviewed sleep hygiene pending BiPAP titration   3 to 4 months in office follow-up Time spent face-to-face 10 to 15 minutes in office  Merian Capron, MD 9/20/20224:30 PM

## 2021-03-03 DIAGNOSIS — F3181 Bipolar II disorder: Secondary | ICD-10-CM | POA: Diagnosis not present

## 2021-03-03 DIAGNOSIS — E782 Mixed hyperlipidemia: Secondary | ICD-10-CM | POA: Diagnosis not present

## 2021-03-03 DIAGNOSIS — J4520 Mild intermittent asthma, uncomplicated: Secondary | ICD-10-CM | POA: Diagnosis not present

## 2021-03-03 DIAGNOSIS — G4700 Insomnia, unspecified: Secondary | ICD-10-CM | POA: Diagnosis not present

## 2021-03-03 DIAGNOSIS — F331 Major depressive disorder, recurrent, moderate: Secondary | ICD-10-CM | POA: Diagnosis not present

## 2021-03-03 DIAGNOSIS — K219 Gastro-esophageal reflux disease without esophagitis: Secondary | ICD-10-CM | POA: Diagnosis not present

## 2021-03-03 DIAGNOSIS — I10 Essential (primary) hypertension: Secondary | ICD-10-CM | POA: Diagnosis not present

## 2021-03-03 DIAGNOSIS — I4891 Unspecified atrial fibrillation: Secondary | ICD-10-CM | POA: Diagnosis not present

## 2021-03-15 DIAGNOSIS — F331 Major depressive disorder, recurrent, moderate: Secondary | ICD-10-CM | POA: Diagnosis not present

## 2021-03-15 DIAGNOSIS — K219 Gastro-esophageal reflux disease without esophagitis: Secondary | ICD-10-CM | POA: Diagnosis not present

## 2021-03-15 DIAGNOSIS — E782 Mixed hyperlipidemia: Secondary | ICD-10-CM | POA: Diagnosis not present

## 2021-03-15 DIAGNOSIS — J4520 Mild intermittent asthma, uncomplicated: Secondary | ICD-10-CM | POA: Diagnosis not present

## 2021-03-15 DIAGNOSIS — I10 Essential (primary) hypertension: Secondary | ICD-10-CM | POA: Diagnosis not present

## 2021-03-15 DIAGNOSIS — G4700 Insomnia, unspecified: Secondary | ICD-10-CM | POA: Diagnosis not present

## 2021-03-15 DIAGNOSIS — I4891 Unspecified atrial fibrillation: Secondary | ICD-10-CM | POA: Diagnosis not present

## 2021-03-15 DIAGNOSIS — F3181 Bipolar II disorder: Secondary | ICD-10-CM | POA: Diagnosis not present

## 2021-03-26 ENCOUNTER — Encounter (HOSPITAL_BASED_OUTPATIENT_CLINIC_OR_DEPARTMENT_OTHER): Payer: PPO | Admitting: Cardiology

## 2021-04-06 ENCOUNTER — Other Ambulatory Visit: Payer: Self-pay

## 2021-04-06 ENCOUNTER — Ambulatory Visit (HOSPITAL_BASED_OUTPATIENT_CLINIC_OR_DEPARTMENT_OTHER): Payer: PPO | Attending: Cardiology | Admitting: Cardiology

## 2021-04-06 VITALS — Ht 66.0 in | Wt 262.0 lb

## 2021-04-06 DIAGNOSIS — G4733 Obstructive sleep apnea (adult) (pediatric): Secondary | ICD-10-CM | POA: Insufficient documentation

## 2021-04-07 NOTE — Procedures (Signed)
   Patient Name: Mary Mcfarland, Mary Mcfarland Date: 04/06/2021 Gender: Female D.O.B: 1955/11/17 Age (years): 76 Referring Provider: Thompson Grayer Height (inches): 66 Interpreting Physician: Fransico Him MD, ABSM Weight (lbs): 262 RPSGT: Carolin Coy BMI: 42 MRN: [id number] Neck Size: 16.00  CLINICAL INFORMATION The patient is referred for a BiPAP titration to treat sleep apnea.  SLEEP STUDY TECHNIQUE As per the AASM Manual for the Scoring of Sleep and Associated Events v2.3 (April 2016) with a hypopnea requiring 4% desaturations.  The channels recorded and monitored were frontal, central and occipital EEG, electrooculogram (EOG), submentalis EMG (chin), nasal and oral airflow, thoracic and abdominal wall motion, anterior tibialis EMG, snore microphone, electrocardiogram, and pulse oximetry. Bilevel positive airway pressure (BPAP) was initiated at the beginning of the study and titrated to treat sleep-disordered breathing.  MEDICATIONS Medications self-administered by patient taken the night of the study : AMBIEN  RESPIRATORY PARAMETERS Optimal IPAP Pressure (cm): 25  AHI at Optimal Pressure (/hr) 0 Optimal EPAP Pressure (cm):21  Overall Minimal O2 (%):67.0  Minimal O2 at Optimal Pressure (%): 88.0  SLEEP ARCHITECTURE Start Time:10:31:06 PM  Stop Time:4:31:07 AM  Total Time (min):360  Total Sleep Time (min):144.5 Sleep Latency (min):45.6  Sleep Efficiency (%):40.1%  REM Latency (min):79.5  WASO (min):169.9 Stage N1 (%): 4.2%  Stage N2 (%): 66.8%  Stage N3 (%): 0.0%  Stage R (%):19 Supine (%):40.48  Arousal Index (/hr):34.5   CARDIAC DATA The 2 lead EKG demonstrated sinus rhythm with intermittent AV pacing. The mean heart rate was 70.1 beats per minute.   LEG MOVEMENT DATA The total Periodic Limb Movements of Sleep (PLMS) were 0. The PLMS index was 0.0. A PLMS index of <15 is considered normal in adults.  IMPRESSIONS - An optimal PAP pressure was selected for this  patient ( 25 / cm of water) - Severe oxygen desaturations were observed during this titration (min O2 = 67.0%). - The patient snored with moderate snoring volume. - NSR with intermittent AV pacing was observed during this study. - Clinically significant periodic limb movements were not noted during this study. Arousals associated with PLMs were rare.  DIAGNOSIS - Obstructive Sleep Apnea (G47.33) - Nocturnal Hypoxemia  RECOMMENDATIONS - Trial of auto BiPAP therapywith IPAP max 20cm H2O, EPAP min 5cm H2O and PS 4cm H2O  with a Medium size Resmed Full Face Mask AirFit F10 mask and heated humidification. - Avoid alcohol, sedatives and other CNS depressants that may worsen sleep apnea and disrupt normal sleep architecture. - Sleep hygiene should be reviewed to assess factors that may improve sleep quality. - Weight management and regular exercise should be initiated or continued. - Return to Sleep Center for re-evaluation after 4 weeks of therapy  [Electronically signed] 04/07/2021 05:24 PM  Fransico Him MD, ABSM Diplomate, American Board of Sleep Medicine

## 2021-04-08 ENCOUNTER — Telehealth: Payer: Self-pay | Admitting: *Deleted

## 2021-04-08 NOTE — Telephone Encounter (Signed)
-----   Message from Sueanne Margarita, MD sent at 04/07/2021  5:27 PM EDT ----- Please let patient know that they had a successful PAP titration and let DME know that orders are in EPIC.  Please set up 6 week OV with me.

## 2021-04-08 NOTE — Telephone Encounter (Signed)
The patient has been notified of the result and verbalized understanding.  All questions (if any) were answered. Mary Mcfarland, [place] 04/08/2021 1:48 PM    Upon patient request DME selection is [name] Patient understands She will be contacted by [name] to set up his cpap. Patient understands to call if [name] does not contact her with new setup in a timely manner. Patient understands they will be called once confirmation has been received from [facility] that they have received their new machine to schedule 10 week follow up appointment.   [name] notified of new cpap order  Please add to airview Patient was grateful for the call and thanked me

## 2021-04-19 ENCOUNTER — Ambulatory Visit: Payer: PPO | Admitting: Internal Medicine

## 2021-04-19 ENCOUNTER — Other Ambulatory Visit: Payer: Self-pay

## 2021-04-19 ENCOUNTER — Encounter: Payer: Self-pay | Admitting: Internal Medicine

## 2021-04-19 VITALS — BP 116/66 | HR 85 | Ht 65.0 in | Wt 269.0 lb

## 2021-04-19 DIAGNOSIS — I4819 Other persistent atrial fibrillation: Secondary | ICD-10-CM

## 2021-04-19 DIAGNOSIS — R001 Bradycardia, unspecified: Secondary | ICD-10-CM | POA: Diagnosis not present

## 2021-04-19 DIAGNOSIS — I10 Essential (primary) hypertension: Secondary | ICD-10-CM | POA: Diagnosis not present

## 2021-04-19 LAB — CUP PACEART INCLINIC DEVICE CHECK
Battery Remaining Longevity: 92 mo
Battery Voltage: 3.02 V
Brady Statistic RA Percent Paced: 47 %
Brady Statistic RV Percent Paced: 51 %
Date Time Interrogation Session: 20221107103324
Implantable Lead Implant Date: 20210826
Implantable Lead Implant Date: 20210826
Implantable Lead Location: 753859
Implantable Lead Location: 753860
Implantable Pulse Generator Implant Date: 20210826
Lead Channel Impedance Value: 412.5 Ohm
Lead Channel Impedance Value: 475 Ohm
Lead Channel Pacing Threshold Amplitude: 0.75 V
Lead Channel Pacing Threshold Amplitude: 1 V
Lead Channel Pacing Threshold Pulse Width: 0.4 ms
Lead Channel Pacing Threshold Pulse Width: 0.5 ms
Lead Channel Sensing Intrinsic Amplitude: 2.9 mV
Lead Channel Sensing Intrinsic Amplitude: 5.7 mV
Lead Channel Setting Pacing Amplitude: 2 V
Lead Channel Setting Pacing Amplitude: 2.5 V
Lead Channel Setting Pacing Pulse Width: 0.4 ms
Lead Channel Setting Sensing Sensitivity: 2 mV
Pulse Gen Model: 2272
Pulse Gen Serial Number: 3859348

## 2021-04-19 LAB — BASIC METABOLIC PANEL
BUN/Creatinine Ratio: 19 (ref 12–28)
BUN: 17 mg/dL (ref 8–27)
CO2: 25 mmol/L (ref 20–29)
Calcium: 9.7 mg/dL (ref 8.7–10.3)
Chloride: 102 mmol/L (ref 96–106)
Creatinine, Ser: 0.91 mg/dL (ref 0.57–1.00)
Glucose: 118 mg/dL — ABNORMAL HIGH (ref 70–99)
Potassium: 4 mmol/L (ref 3.5–5.2)
Sodium: 141 mmol/L (ref 134–144)
eGFR: 71 mL/min/{1.73_m2} (ref >59)

## 2021-04-19 LAB — CBC
Hematocrit: 41 % (ref 34.0–46.6)
Hemoglobin: 13.7 g/dL (ref 11.1–15.9)
MCH: 28.3 pg (ref 26.6–33.0)
MCHC: 33.4 g/dL (ref 31.5–35.7)
MCV: 85 fL (ref 79–97)
Platelets: 352 10*3/uL (ref 150–450)
RBC: 4.84 x10E6/uL (ref 3.77–5.28)
RDW: 12.3 % (ref 11.7–15.4)
WBC: 10 10*3/uL (ref 3.4–10.8)

## 2021-04-19 LAB — MAGNESIUM: Magnesium: 2.1 mg/dL (ref 1.6–2.3)

## 2021-04-19 NOTE — Patient Instructions (Addendum)
Medication Instructions:  Your physician recommends that you continue on your current medications as directed. Please refer to the Current Medication list given to you today. *If you need a refill on your cardiac medications before your next appointment, please call your pharmacy*  Lab Work: CBC, BMP, MG If you have labs (blood work) drawn today and your tests are completely normal, you will receive your results only by: [place] (if you have MyChart) OR A paper copy in the mail If you have any lab test that is abnormal or we need to change your treatment, we will call you to review the results.  Testing/Procedures: None.  Follow-Up: At [facility], you and your health needs are our priority.  As part of our continuing mission to provide you with exceptional heart care, we have created designated Provider Care Teams.  These Care Teams include your primary Cardiologist (physician) and Advanced Practice Providers (APPs -  Physician Assistants and Nurse Practitioners) who all work together to provide you with the care you need, when you need it.  Your physician wants you to follow-up in: 6 months in the [facility]. They will contact you to schedule.   Remote monitoring is used to monitor your Pacemaker from home. This monitoring reduces the number of office visits required to check your device to one time per year. It allows [place] to keep an eye on the functioning of your device to ensure it is working properly. You are scheduled for a device check from home on 05/10/21. You may send your transmission at any time that day. If you have a wireless device, the transmission will be sent automatically. After your physician reviews your transmission, you will receive a postcard with your next transmission date.  We recommend signing up for the patient portal called "MyChart".  Sign up information is provided on this After Visit Summary.  MyChart is used to connect with patients for Virtual Visits  (Telemedicine).  Patients are able to view lab/test results, encounter notes, upcoming appointments, etc.  Non-urgent messages can be sent to your provider as well.   To learn more about what you can do with MyChart, go to [email or web address].    Any Other Special Instructions Will Be Listed Below (If Applicable).

## 2021-04-19 NOTE — Progress Notes (Signed)
PCP: Daisy Floro, MD Primary Cardiologist: Dr Cristal Deer Primary EP:  Dr Chyrel Masson is a 65 y.o. female who presents today for routine electrophysiology followup.  Since last being seen in our clinic, the patient reports doing reasonably well.  She is not very active.  She has very little interest in activity.  She thinks that she is partially depressed.  She follows with a psychiatrist.  She is SOB with moderate activity.  Today, she denies symptoms of palpitations, chest pain, lower extremity edema, dizziness, presyncope, or syncope.  The patient is otherwise without complaint today.   Past Medical History:  Diagnosis Date   Asthma    Atrial fibrillation (HCC)    Back pain    Bipolar disorder (HCC)    Constipation    Depression    Difficult intubation    Narrow airway   Heartburn    High cholesterol    Hypertension    Longstanding persistent atrial fibrillation (HCC)    Palpitations    Pinched vertebral nerve    sciatica   Prediabetes    Sleep apnea    sleep study was "inconclusive" per patient   SOB (shortness of breath)    Swelling of both lower extremities    Visit for monitoring Tikosyn therapy 08/27/2019   Vitamin D deficiency    Past Surgical History:  Procedure Laterality Date   bone spur surgery     CARDIOVERSION N/A 07/24/2019   Procedure: CARDIOVERSION;  Surgeon: Jodelle Red, MD;  Location: [facility] ENDOSCOPY;  Service: Cardiovascular;  Laterality: N/A;   COLONOSCOPY     FOOT SURGERY  2017   HEEL SPUR RESECTION Left 03/15/2016   Procedure: ACHILLIES TENDON REPAIR AND PUMP BUMP EXCISION;  Surgeon: Marcene Corning, MD;  Location: [facility] OR;  Service: Orthopedics;  Laterality: Left;   PACEMAKER IMPLANT N/A 02/06/2020   Procedure: PACEMAKER IMPLANT;  Surgeon: Hillis Range, MD;  Location: [facility] INVASIVE CV LAB;  Service: Cardiovascular;  Laterality: N/A;   SHOULDER SURGERY  2010   TOE SURGERY  2011   TONSILLECTOMY      ROS- all systems  are reviewed and negative except as per HPI above  Current Outpatient Medications  Medication Sig Dispense Refill   albuterol (PROVENTIL HFA;VENTOLIN HFA) 108 (90 Base) MCG/ACT inhaler Inhale 2 puffs into the lungs every 6 (six) hours as needed for wheezing or shortness of breath.     atorvastatin (LIPITOR) 40 MG tablet TAKE ONE TABLET BY MOUTH ONCE DAILY 90 tablet 2   buPROPion (WELLBUTRIN SR) 150 MG 12 hr tablet Take 300 mg by mouth daily.     DILT-XR 120 MG 24 hr capsule TAKE ONE CAPSULE BY MOUTH ONCE DAILY 90 capsule 2   diltiazem (CARDIZEM CD) 120 MG 24 hr capsule Take 120 mg by mouth daily.     dofetilide (TIKOSYN) 250 MCG capsule Take 1 capsule (250 mcg total) by mouth 2 (two) times daily. 60 capsule 11   ELIQUIS 5 MG TABS tablet TAKE ONE TABLET BY MOUTH TWICE DAILY 180 tablet 1   lamoTRIgine (LAMICTAL) 100 MG tablet Take 1 tablet (100 mg total) by mouth daily. Change dose from 4 tablets of 25  to 1 tablet of 100mg  for record 1 tablet 0   Levocetirizine Dihydrochloride (XYZAL PO) Take 1 tablet by mouth daily as needed.     lisinopril (ZESTRIL) 10 MG tablet Take 10 mg by mouth daily.     Naphazoline-Pheniramine (OPCON-A) 0.027-0.315 % SOLN Place 1-2 drops  into both eyes 3 (three) times daily as needed (for allergies).      omeprazole (PRILOSEC) 20 MG capsule Take 20 mg by mouth daily before breakfast.      valACYclovir (VALTREX) 1000 MG tablet Take 1 tablet (1,000 mg total) by mouth 3 (three) times daily. 21 tablet 0   zolpidem (AMBIEN) 10 MG tablet 1/2-1 tablet at bedtime as needed     furosemide (LASIX) 40 MG tablet Take 1 tablet (40 mg total) by mouth 2 (two) times daily. Take afternoon dose only as needed. 180 tablet 3   No current facility-administered medications for this visit.    Physical Exam: Vitals:   04/19/21 1019  Weight: 269 lb (122 kg)  Height: $Remove'5\' 5"'AVgyzlc$  (1.651 m)    GEN- The patient is well appearing, alert and oriented x 3 today.   Head- normocephalic,  atraumatic Eyes-  Sclera clear, conjunctiva pink Ears- hearing intact Oropharynx- clear Lungs- Clear to ausculation bilaterally, normal work of breathing Chest- pacemaker pocket is well healed Heart- Regular rate and rhythm, no murmurs, rubs or gallops, PMI not laterally displaced GI- soft, NT, ND, + BS Extremities- no clubbing, cyanosis, or edema  Pacemaker interrogation- reviewed in detail today,  See PACEART report  ekg tracing ordered today is personally reviewed and shows sinus, PR 222, RBBB (QRS 152), Qtc 497 msec  Assessment and Plan:  1. Symptomatic sinus bradycardia  Normal pacemaker function See Pace Art report No changes today she is not device dependant today  2. Persistent afib/ atypical atrial flutter She is on eliquis for stroke prevention Doing well with tikosyn (AF burden is 2.1%) Labs 01/13/21 reviewed Repeat cbc, met, mg today  3. HTN Stable No change required today Bmet today  4. Morbid obesity Body mass index is 44.76 kg/m. Lifestyle modification is advised  5. Severe OSA Uses bipap  6. Chronic diastolic dysfunction Stable No change required today   Risks, benefits and potential toxicities for medications prescribed and/or refilled reviewed with patient today.   Follow-up in AF clinic in 6 months Dr Harrell Gave to see as scheduled  Thompson Grayer MD, [facility] 04/19/2021 10:33 AM

## 2021-05-09 ENCOUNTER — Encounter: Payer: Self-pay | Admitting: Cardiology

## 2021-05-10 ENCOUNTER — Ambulatory Visit (INDEPENDENT_AMBULATORY_CARE_PROVIDER_SITE_OTHER): Payer: PPO

## 2021-05-10 ENCOUNTER — Encounter: Payer: Self-pay | Admitting: Internal Medicine

## 2021-05-10 DIAGNOSIS — I495 Sick sinus syndrome: Secondary | ICD-10-CM | POA: Diagnosis not present

## 2021-05-10 LAB — CUP PACEART REMOTE DEVICE CHECK
Battery Remaining Longevity: 92 mo
Battery Remaining Percentage: 90 %
Battery Voltage: 3.02 V
Brady Statistic AP VP Percent: 41 %
Brady Statistic AP VS Percent: 14 %
Brady Statistic AS VP Percent: 13 %
Brady Statistic AS VS Percent: 20 %
Brady Statistic RA Percent Paced: 51 %
Brady Statistic RV Percent Paced: 54 %
Date Time Interrogation Session: 20221128020019
Implantable Lead Implant Date: 20210826
Implantable Lead Implant Date: 20210826
Implantable Lead Location: 753859
Implantable Lead Location: 753860
Implantable Pulse Generator Implant Date: 20210826
Lead Channel Impedance Value: 400 Ohm
Lead Channel Impedance Value: 560 Ohm
Lead Channel Pacing Threshold Amplitude: 0.75 V
Lead Channel Pacing Threshold Amplitude: 1 V
Lead Channel Pacing Threshold Pulse Width: 0.4 ms
Lead Channel Pacing Threshold Pulse Width: 0.5 ms
Lead Channel Sensing Intrinsic Amplitude: 3.2 mV
Lead Channel Sensing Intrinsic Amplitude: 5 mV
Lead Channel Setting Pacing Amplitude: 2 V
Lead Channel Setting Pacing Amplitude: 2.5 V
Lead Channel Setting Pacing Pulse Width: 0.4 ms
Lead Channel Setting Sensing Sensitivity: 2 mV
Pulse Gen Model: 2272
Pulse Gen Serial Number: 3859348

## 2021-05-11 ENCOUNTER — Telehealth: Payer: Self-pay | Admitting: Internal Medicine

## 2021-05-11 NOTE — Telephone Encounter (Signed)
Patient wanted to check on the status of of her cpap. Please call

## 2021-05-12 NOTE — Telephone Encounter (Signed)
Returned call to patient and informed her [employer] is running a month behind with getting the cpaps out to their patients.

## 2021-05-14 NOTE — Telephone Encounter (Signed)
Returned call to patient and informed her [employer] (Melissa) has began working on her Bipap order.

## 2021-05-18 NOTE — Progress Notes (Signed)
Remote pacemaker transmission.   

## 2021-05-19 ENCOUNTER — Encounter (HOSPITAL_BASED_OUTPATIENT_CLINIC_OR_DEPARTMENT_OTHER): Payer: Self-pay

## 2021-05-24 ENCOUNTER — Other Ambulatory Visit: Payer: Self-pay | Admitting: Cardiology

## 2021-05-24 NOTE — Telephone Encounter (Signed)
Eliquis 5 mg refill request received. Patient is 65 years old, weight-122 kg, Crea- 0.91 on 04/19/21, Diagnosis-afib, and last seen by Dr. Rayann Heman on 04/19/21. Dose is appropriate based on dosing criteria. Will send in refill to requested pharmacy.

## 2021-05-26 ENCOUNTER — Other Ambulatory Visit (HOSPITAL_COMMUNITY): Payer: Self-pay

## 2021-05-26 MED ORDER — LAMOTRIGINE 100 MG PO TABS: 100.0000 mg | ORAL_TABLET | Freq: Every day | ORAL | 0 refills | Status: DC

## 2021-06-01 DIAGNOSIS — J4520 Mild intermittent asthma, uncomplicated: Secondary | ICD-10-CM | POA: Diagnosis not present

## 2021-06-01 DIAGNOSIS — G4700 Insomnia, unspecified: Secondary | ICD-10-CM | POA: Diagnosis not present

## 2021-06-01 DIAGNOSIS — E782 Mixed hyperlipidemia: Secondary | ICD-10-CM | POA: Diagnosis not present

## 2021-06-01 DIAGNOSIS — K219 Gastro-esophageal reflux disease without esophagitis: Secondary | ICD-10-CM | POA: Diagnosis not present

## 2021-06-01 DIAGNOSIS — I10 Essential (primary) hypertension: Secondary | ICD-10-CM | POA: Diagnosis not present

## 2021-06-01 DIAGNOSIS — I4891 Unspecified atrial fibrillation: Secondary | ICD-10-CM | POA: Diagnosis not present

## 2021-06-01 DIAGNOSIS — F3181 Bipolar II disorder: Secondary | ICD-10-CM | POA: Diagnosis not present

## 2021-06-01 DIAGNOSIS — F331 Major depressive disorder, recurrent, moderate: Secondary | ICD-10-CM | POA: Diagnosis not present

## 2021-06-15 ENCOUNTER — Other Ambulatory Visit: Payer: Self-pay | Admitting: Internal Medicine

## 2021-06-15 ENCOUNTER — Encounter: Payer: Self-pay | Admitting: Cardiology

## 2021-06-15 NOTE — Telephone Encounter (Signed)
Successful telephone encounter to patient in response to her MyChart message about possible AF this weekend. Transmission received. There is no AF events noted. Per patient she was sure she was in AF because her watch said her HR was variable and she has not felt well lately. Advised patient she had not had an episode of AF. Patient also states she is unsure if she has any refills of tikosyn. Encouraged patient to call pharmacy today to confirm and request refill request be sent to prescribing provider. Patient agreeable.

## 2021-06-16 ENCOUNTER — Encounter: Payer: Self-pay | Admitting: Internal Medicine

## 2021-06-16 ENCOUNTER — Encounter: Payer: Self-pay | Admitting: Cardiology

## 2021-06-16 ENCOUNTER — Telehealth: Payer: Self-pay | Admitting: Physician Assistant

## 2021-06-16 DIAGNOSIS — J320 Chronic maxillary sinusitis: Secondary | ICD-10-CM | POA: Diagnosis not present

## 2021-06-16 NOTE — Telephone Encounter (Signed)
Page by answering service after hours.  Patient brought some sort of " arch support" for her foot pain.  The support has copper in it and patient was wanted to know if okay to use given history of pacemaker implantation.  I have personally discussed with Tommye Standard, [facility] who recommended okay to use support.  Advised to keep away from pacemaker site.  I have reviewed instruction with patient.

## 2021-06-17 ENCOUNTER — Ambulatory Visit (HOSPITAL_COMMUNITY): Payer: PPO | Admitting: Psychiatry

## 2021-06-21 DIAGNOSIS — M79672 Pain in left foot: Secondary | ICD-10-CM | POA: Diagnosis not present

## 2021-06-22 ENCOUNTER — Encounter: Payer: PPO | Admitting: Cardiology

## 2021-06-23 ENCOUNTER — Telehealth (HOSPITAL_COMMUNITY): Payer: PPO | Admitting: Psychiatry

## 2021-07-05 DIAGNOSIS — H109 Unspecified conjunctivitis: Secondary | ICD-10-CM | POA: Diagnosis not present

## 2021-07-10 ENCOUNTER — Emergency Department (HOSPITAL_BASED_OUTPATIENT_CLINIC_OR_DEPARTMENT_OTHER)
Admission: EM | Admit: 2021-07-10 | Discharge: 2021-07-10 | Disposition: A | Discharge status: Home / Self Care | Payer: PPO | Attending: Emergency Medicine | Admitting: Emergency Medicine

## 2021-07-10 ENCOUNTER — Encounter (HOSPITAL_BASED_OUTPATIENT_CLINIC_OR_DEPARTMENT_OTHER): Payer: Self-pay

## 2021-07-10 ENCOUNTER — Other Ambulatory Visit: Payer: Self-pay

## 2021-07-10 DIAGNOSIS — I4891 Unspecified atrial fibrillation: Secondary | ICD-10-CM | POA: Insufficient documentation

## 2021-07-10 DIAGNOSIS — Z7901 Long term (current) use of anticoagulants: Secondary | ICD-10-CM | POA: Insufficient documentation

## 2021-07-10 DIAGNOSIS — Z20822 Contact with and (suspected) exposure to covid-19: Secondary | ICD-10-CM | POA: Diagnosis not present

## 2021-07-10 DIAGNOSIS — J069 Acute upper respiratory infection, unspecified: Secondary | ICD-10-CM | POA: Diagnosis not present

## 2021-07-10 DIAGNOSIS — B9789 Other viral agents as the cause of diseases classified elsewhere: Secondary | ICD-10-CM | POA: Diagnosis not present

## 2021-07-10 DIAGNOSIS — J029 Acute pharyngitis, unspecified: Secondary | ICD-10-CM | POA: Diagnosis present

## 2021-07-10 LAB — GROUP A STREP BY PCR: Group A Strep by PCR: NOT DETECTED

## 2021-07-10 LAB — RESP PANEL BY RT-PCR (FLU A&B, COVID) ARPGX2
Influenza A by PCR: NEGATIVE
Influenza B by PCR: NEGATIVE
SARS Coronavirus 2 by RT PCR: NEGATIVE

## 2021-07-10 MED ORDER — GUAIFENESIN-DM 100-10 MG/5ML PO SYRP: 10.0000 mL | ORAL_SOLUTION | Freq: Four times a day (QID) | ORAL | 0 refills | Status: DC | PRN

## 2021-07-10 MED ORDER — ACETAMINOPHEN 325 MG PO TABS: 650.0000 mg | ORAL_TABLET | Freq: Four times a day (QID) | ORAL | 0 refills | Status: DC | PRN

## 2021-07-10 MED ORDER — PREDNISONE 20 MG PO TABS: 20.0000 mg | ORAL_TABLET | Freq: Every day | ORAL | 0 refills | Status: DC

## 2021-07-10 MED ORDER — FLUTICASONE PROPIONATE 50 MCG/ACT NA SUSP: 2.0000 | Freq: Two times a day (BID) | NASAL | 0 refills | Status: DC

## 2021-07-10 MED ORDER — IBUPROFEN 800 MG PO TABS
800.0000 mg | ORAL_TABLET | Freq: Once | ORAL | Status: DC
  Filled 2021-07-10: qty 1

## 2021-07-10 NOTE — ED Provider Notes (Addendum)
[facility] EMERGENCY DEPT Provider Note   CSN: [id number] Arrival date & time: 07/10/21  0940     History  Chief Complaint  Patient presents with   Sore Throat    Mary Mcfarland is a 66 y.o. female.  Pt is a 66 yo female with pmh including afib on eliquis and cardizem presenting for sore throat x 1 week. Pt admits to nasal congestion, headache, rhinorrhea, cough, and sore throat. Pt able to tolerate PO. Denies drooling or difficulty tolerating secretions.   The history is provided by the patient. No language interpreter was used.  Sore Throat Associated symptoms include headaches. Pertinent negatives include no chest pain, no abdominal pain and no shortness of breath.      Home Medications Prior to Admission medications   Medication Sig Start Date End Date Taking? Authorizing Provider  albuterol (PROVENTIL HFA;VENTOLIN HFA) 108 (90 Base) MCG/ACT inhaler Inhale 2 puffs into the lungs every 6 (six) hours as needed for wheezing or shortness of breath.    [provider]  atorvastatin (LIPITOR) 40 MG tablet TAKE ONE TABLET BY MOUTH ONCE DAILY 03/01/21   Buford Dresser, MD  buPROPion [facility] SR) 150 MG 12 hr tablet Take 300 mg by mouth daily.    [provider]  DILT-XR 120 MG 24 hr capsule TAKE ONE CAPSULE BY MOUTH ONCE DAILY 03/01/21   Buford Dresser, MD  diltiazem (CARDIZEM CD) 120 MG 24 hr capsule Take 120 mg by mouth daily.    [provider]  dofetilide (TIKOSYN) 250 MCG capsule TAKE ONE CAPSULE BY MOUTH TWICE A DAY 06/16/21   Allred, Jeneen Rinks, MD  ELIQUIS 5 MG TABS tablet TAKE ONE TABLET BY MOUTH TWICE DAILY 05/24/21   Allred, Jeneen Rinks, MD  furosemide (LASIX) 40 MG tablet Take 1 tablet (40 mg total) by mouth 2 (two) times daily. Take afternoon dose only as needed. 11/30/20 02/28/21  Buford Dresser, MD  lamoTRIgine (LAMICTAL) 100 MG tablet Take 1 tablet (100 mg total) by mouth daily. Change dose from 4 tablets of 25   to 1 tablet of 100mg  for record 05/26/21 08/24/21  Merian Capron, MD  Levocetirizine Dihydrochloride (XYZAL PO) Take 1 tablet by mouth daily as needed.    [provider]  lisinopril (ZESTRIL) 10 MG tablet Take 10 mg by mouth daily. 01/21/21   [provider]  Naphazoline-Pheniramine (OPCON-A) 0.027-0.315 % SOLN Place 1-2 drops into both eyes 3 (three) times daily as needed (for allergies).     [provider]  omeprazole (PRILOSEC) 20 MG capsule Take 20 mg by mouth daily before breakfast.     [provider]  valACYclovir (VALTREX) 1000 MG tablet Take 1 tablet (1,000 mg total) by mouth 3 (three) times daily. 07/28/20   Brunetta Jeans, PA-C  zolpidem (AMBIEN) 10 MG tablet 1/2-1 tablet at bedtime as needed 08/04/20   [provider]      Allergies    Niacin    Review of Systems   Review of Systems  Constitutional:  Negative for chills and fever.  HENT:  Positive for congestion, rhinorrhea and sore throat. Negative for ear pain.   Eyes:  Negative for pain and visual disturbance.  Respiratory:  Positive for cough. Negative for shortness of breath.   Cardiovascular:  Negative for chest pain and palpitations.  Gastrointestinal:  Negative for abdominal pain and vomiting.  Genitourinary:  Negative for dysuria and hematuria.  Musculoskeletal:  Negative for arthralgias and back pain.  Skin:  Negative for color  change and rash.  Neurological:  Positive for headaches. Negative for seizures and syncope.  All other systems reviewed and are negative.  Physical Exam Updated Vital Signs BP 123/61 (BP Location: Right Arm)    Pulse (!) 103    Temp 99.5 F (37.5 C) (Oral)    Resp 18    LMP 09/24/2013    SpO2 94%  Physical Exam Vitals and nursing note reviewed.  Constitutional:      General: She is not in acute distress.    Appearance: She is well-developed.  HENT:     Head: Normocephalic and atraumatic.     Right Ear: Hearing, tympanic membrane, ear  canal and external ear normal.     Left Ear: Hearing, tympanic membrane, ear canal and external ear normal.     Nose: Congestion and rhinorrhea present.     Mouth/Throat:     Pharynx: No posterior oropharyngeal erythema.     Comments: No tonsils White stone like substance on posterior oropharynx Erythema without swelling Eyes:     Conjunctiva/sclera: Conjunctivae normal.  Cardiovascular:     Rate and Rhythm: Normal rate and regular rhythm.     Heart sounds: No murmur heard. Pulmonary:     Effort: Pulmonary effort is normal. No respiratory distress.     Breath sounds: Normal breath sounds.  Abdominal:     Palpations: Abdomen is soft.     Tenderness: There is no abdominal tenderness.  Musculoskeletal:        General: No swelling.     Cervical back: Neck supple.  Skin:    General: Skin is warm and dry.     Capillary Refill: Capillary refill takes less than 2 seconds.  Neurological:     Mental Status: She is alert.  Psychiatric:        Mood and Affect: Mood normal.    ED Results / Procedures / Treatments   Labs (all labs ordered are listed, but only abnormal results are displayed) Labs Reviewed  RESP PANEL BY RT-PCR (FLU A&B, COVID) ARPGX2  GROUP A STREP BY PCR    EKG None  Radiology No results found.  Procedures Procedures    Medications Ordered in ED Medications  ibuprofen (ADVIL) tablet 800 mg (800 mg Oral Not Given 07/10/21 0909)    ED Course/ Medical Decision Making/ A&P                           Medical Decision Making Risk OTC drugs. Prescription drug management.   9:30 AM 66 yo female with pmh including afib on eliquis and cardizem presenting for sore throat and URI symptoms. Pt is Aox3, no acute distress, afebrile, with stable vitals. Orophyanx is clear with no signs of airway compromise. No drooling or pooling of secretions. Low suspicion of Ludwig angina or  or prevertebral abs or prevertebral abscess. Strep, covid, and influenza negative. Pt  given symptomatic viral uri medication that is safe to take with hx of afib. Patient in no distress and overall condition improved here in the ED. Detailed discussions were had with the patient regarding current findings, and need for close f/u with PCP or on call doctor. The patient has been instructed to return immediately if the symptoms worsen in any way for re-evaluation. Patient verbalized understanding and is in agreement with current care plan. All questions answered prior to discharge.         Final Clinical Impression(s) / ED Diagnoses Final diagnoses:  Viral  URI with cough    Rx / DC Orders ED Discharge Orders     None         Lianne Cure, DO 77/03/40 [id number]    Campbell Stall P, DO 81/85/90 2127

## 2021-07-10 NOTE — ED Triage Notes (Signed)
She c/o sore throat x 1 week. She states she was found to have, and be treated successfully for "pinkeye" a week ago, and at that time, her pcp told her her throat "didn't look too bad". She is ambulatory and in no distress.

## 2021-07-10 NOTE — Discharge Instructions (Signed)
Cold meds that you should NOT take with afib:  Sudafed (pseudoephedrine) Sudafed PE (phenylephrine) Afrin and other decongestant nasal sprays and pumps (oxymetazoline) Combination cold and sinus medications that have both decongestants and antihistamines

## 2021-07-12 ENCOUNTER — Encounter (HOSPITAL_BASED_OUTPATIENT_CLINIC_OR_DEPARTMENT_OTHER): Payer: Self-pay

## 2021-07-13 ENCOUNTER — Encounter: Payer: Self-pay | Admitting: Cardiology

## 2021-07-13 DIAGNOSIS — J060 Acute laryngopharyngitis: Secondary | ICD-10-CM | POA: Diagnosis not present

## 2021-07-13 DIAGNOSIS — J029 Acute pharyngitis, unspecified: Secondary | ICD-10-CM | POA: Diagnosis not present

## 2021-07-15 ENCOUNTER — Other Ambulatory Visit: Payer: Self-pay

## 2021-07-15 ENCOUNTER — Encounter (HOSPITAL_BASED_OUTPATIENT_CLINIC_OR_DEPARTMENT_OTHER): Payer: Self-pay | Admitting: Cardiology

## 2021-07-15 ENCOUNTER — Ambulatory Visit (INDEPENDENT_AMBULATORY_CARE_PROVIDER_SITE_OTHER): Payer: PPO | Admitting: Cardiology

## 2021-07-15 VITALS — BP 112/70 | HR 81 | Ht 65.0 in | Wt 270.6 lb

## 2021-07-15 DIAGNOSIS — G4733 Obstructive sleep apnea (adult) (pediatric): Secondary | ICD-10-CM

## 2021-07-15 DIAGNOSIS — D6869 Other thrombophilia: Secondary | ICD-10-CM

## 2021-07-15 DIAGNOSIS — R0602 Shortness of breath: Secondary | ICD-10-CM | POA: Diagnosis not present

## 2021-07-15 DIAGNOSIS — Z79899 Other long term (current) drug therapy: Secondary | ICD-10-CM | POA: Diagnosis not present

## 2021-07-15 DIAGNOSIS — Z7901 Long term (current) use of anticoagulants: Secondary | ICD-10-CM

## 2021-07-15 DIAGNOSIS — I480 Paroxysmal atrial fibrillation: Secondary | ICD-10-CM

## 2021-07-15 NOTE — Patient Instructions (Signed)

## 2021-07-15 NOTE — Progress Notes (Signed)
Cardiology Office Note:    Date:  07/15/2021   ID:  Mary Mcfarland, DOB 03-16-1956, MRN [id number]  PCP:  Daisy Floro, MD  Cardiologist:  Jodelle Red, MD  EP: Dr. Johney Frame  Referring MD: Daisy Floro, MD   CC: follow up  History of Present Illness:    Mary Mcfarland is a 66 y.o. female with a hx of atrial fibrillation, sinus pauses s/p PPM, hypertension, hyperlipidemia, obesity who is seen for follow up today. I initially met her 12/30/19.  Today: Has had frequent URIs (negative for flu/Covid). Recently seen in ER for this. Started on augmentin and cough syrup, starting to improve. Breathing is "bad", short of breath all the time, even at rest.   Asking about weight loss. Tried a visit with healthy weight and wellness, not interested in returning. We discussed the [facility] program downstairs as well as GLP1RA. She is interested in this. She has prediabetes, last A1c 6.1. BMI 45 today.   Getting her machine for sleep apnea this week.   Has had several episodes of afib based on her device, but she typically does not have any symptoms. Last labs 04/19/21, pending physical with Dr. Tenny Craw coming up with all bloodwork.   We discussed her medications today. Asking about atorvastatin. Reviewed her calcium score was 22 in 2021, suggesting the presence of plaque.  Denies chest pain, shortness of breath at rest or with normal exertion. No PND, orthopnea, LE edema or unexpected weight gain. No syncope or palpitations.   Past Medical History:  Diagnosis Date   Asthma    Atrial fibrillation (HCC)    Back pain    Bipolar disorder (HCC)    Constipation    Depression    Difficult intubation    Narrow airway   Heartburn    High cholesterol    Hypertension    Longstanding persistent atrial fibrillation (HCC)    Palpitations    Pinched vertebral nerve    sciatica   Prediabetes    Sleep apnea    sleep study was "inconclusive" per patient   SOB (shortness of  breath)    Swelling of both lower extremities    Visit for monitoring Tikosyn therapy 08/27/2019   Vitamin D deficiency     Past Surgical History:  Procedure Laterality Date   bone spur surgery     CARDIOVERSION N/A 07/24/2019   Procedure: CARDIOVERSION;  Surgeon: Jodelle Red, MD;  Location: [facility] ENDOSCOPY;  Service: Cardiovascular;  Laterality: N/A;   COLONOSCOPY     FOOT SURGERY  2017   HEEL SPUR RESECTION Left 03/15/2016   Procedure: ACHILLIES TENDON REPAIR AND PUMP BUMP EXCISION;  Surgeon: Marcene Corning, MD;  Location: [facility] OR;  Service: Orthopedics;  Laterality: Left;   PACEMAKER IMPLANT N/A 02/06/2020   Procedure: PACEMAKER IMPLANT;  Surgeon: Hillis Range, MD;  Location: [facility] INVASIVE CV LAB;  Service: Cardiovascular;  Laterality: N/A;   SHOULDER SURGERY  2010   TOE SURGERY  2011   TONSILLECTOMY      Current Medications: (ambmed) Current Outpatient Medications on File Prior to Visit  Medication Sig   albuterol (PROVENTIL HFA;VENTOLIN HFA) 108 (90 Base) MCG/ACT inhaler Inhale 2 puffs into the lungs every 6 (six) hours as needed for wheezing or shortness of breath.   apixaban (ELIQUIS) 5 MG TABS tablet Take 1 tablet (5 mg total) by mouth 2 (two) times daily.   atorvastatin (LIPITOR) 40 MG tablet Take 1 tablet (40 mg total) by mouth daily.  buPROPion (WELLBUTRIN SR) 150 MG 12 hr tablet Take 300 mg by mouth daily.   diltiazem (CARDIZEM CD) 120 MG 24 hr capsule TAKE 1 CAPSULE BY MOUTH ONCE DAILY STOP METOPROLOL    dofetilide (TIKOSYN) 250 MCG capsule Take 1 capsule (250 mcg total) by mouth 2 (two) times daily.   furosemide (LASIX) 40 MG tablet Take 1 tablet (40 mg total) by mouth 2 (two) times daily. Take afternoon dose only as needed.   ibuprofen (ADVIL) 800 MG tablet Take 800 mg by mouth every 8 (eight) hours as needed for moderate pain.    lisinopril (ZESTRIL) 20 MG tablet Take 1 tablet by mouth once daily   Naphazoline-Pheniramine (OPCON-A) 0.027-0.315 % SOLN Place 1-2  drops into both eyes 3 (three) times daily as needed (for allergies).    omeprazole (PRILOSEC) 20 MG capsule Take 20 mg by mouth daily before breakfast.    Allergies:   Niacin   Social History   Tobacco Use   Smoking status: Former    Packs/day: 1.00    Types: Cigarettes    Quit date: 02/13/1996    Years since quitting: 25.4   Smokeless tobacco: Never   Tobacco comments:    Former smoker 02/02/21  Vaping Use   Vaping Use: Never used  Substance Use Topics   Alcohol use: Yes    Alcohol/week: 1.0 - 2.0 standard drink    Types: 1 - 2 Standard drinks or equivalent per week    Comment: occassionally   Drug use: Never    Family History: family history includes Alcoholism in her father; Bipolar disorder in her mother; CVA in her mother; Cancer in her mother; Depression in her mother; Diabetes in her father; Drug abuse in her father; Heart attack in her father; Heart disease in her father and mother; Hypertension in her father and mother; Liver disease in her father; Obesity in her father and mother; Stroke in her mother.  ROS:   Please see the history of present illness.   Additional pertinent ROS otherwise unremarkable.  EKGs/Labs/Other Studies Reviewed:    The following studies were reviewed today:  Lexiscan Myoview 02/28/2020: Lexiscan stress is electrically negative for ischemia Myovue scan shows normal perfusion LVEf 67% This is a low risk study.  Pacemaker Implant 02/06/2020:  1. Successful implantation of a St Jude Medical Assurity MRI conditional  dual-chamber pacemaker for symptomatic sinus bradycardia  2. No early apparent complications.     Monitor 01/15/2020: Sinus rhythm Episodes of daytime sinus bradycardia, accelerated idioventricular rhythm, and sinus pauses are noted Frequent premature atrial contractions and nonsustained atrial tachycardia are noted  -Echo 06/2019: EF 60-65%, indeterminate DF, no significant valve disease. LA diameter 4.5 cm -Ca score 06/2019:  Ca score 22. Reviewed what this means -attempted CT coronary 12/18/19, highly variable heart rate, test cancelled.  EKG:  EKG is personally reviewed.   01/12/2021: NSR with 1st degree AV block, RBBB, occ v pacing 10/29/2020: SR, frequent pacing, RBBB 02/25/20: atrial paced rhythm, RBBB, 70 bpm  Recent Labs: 04/19/2021: BUN 17; Creatinine, Ser 0.91; Hemoglobin 13.7; Magnesium 2.1; Platelets 352; Potassium 4.0; Sodium 141  Recent Lipid Panel    Component Value Date/Time   CHOL 168 01/14/2020 1212   TRIG 78 01/14/2020 1212   HDL 61 01/14/2020 1212   CHOLHDL 3.1 11/13/2019 0843   LDLCALC 92 01/14/2020 1212    Physical Exam:    VS:  BP 112/70 (BP Location: Right Arm, Patient Position: Sitting, Cuff Size: Large)    Pulse 81  Ht '5\' 5"'$  (1.651 m)    Wt 270 lb 9.6 oz (122.7 kg)    LMP 09/24/2013    SpO2 92%    BMI 45.03 kg/m     Wt Readings from Last 3 Encounters:  07/15/21 270 lb 9.6 oz (122.7 kg)  04/19/21 269 lb (122 kg)  04/06/21 262 lb (118.8 kg)    GEN: Well nourished, well developed in no acute distress HEENT: Normal, moist mucous membranes NECK: No JVD CARDIAC: regular rhythm, normal S1 and S2, no rubs or gallops. No murmur. VASCULAR: Radial and DP pulses 2+ bilaterally. No carotid bruits RESPIRATORY:  Clear to auscultation without rales, wheezing or rhonchi  ABDOMEN: Soft, non-tender, non-distended MUSCULOSKELETAL:  Ambulates independently SKIN: Warm and dry, trivial bilateral nonpitting LE edema NEUROLOGIC:  Alert and oriented x 3. No focal neuro deficits noted. PSYCHIATRIC:  Normal affect    ASSESSMENT:    1. Paroxysmal atrial fibrillation (HCC)   2. Secondary hypercoagulable state ([place])   3. Long term current use of anticoagulant   4. Encounter for long-term current use of high risk medication   5. Morbid obesity ([place])   6. Shortness of breath   7. OSA (obstructive sleep apnea)    PLAN:    Obesity, BMI 45 Abnormal glucose handling, A1c 6.1 -plans to talk to  [facility] program for nutrition assistance and exercise -we discussed GLP1RA today. She is interested in this. Will forward to our PharmD team to see what would be covered  Fatigue, dyspnea on exertion, lower extremity edema: -she has had recent echo, monitor, and pacemaker -nuclear stress test reassuring -given extensive workup, would consider noncardiac etiology   Atrial fibrillation, paroxysmal: -now paroxysmal on dofetilide -CHA2DS2/VAS Stroke Risk Points= 2 -continue apixaban -s/p PPM to avoid conversion pauses   Hypertension: -at goal today -continue lisinopril, furosemide, diltiazem   sleep apnea: -getting bipap this week   Hyperlipidemia: -continue atorvastatin  Cardiac risk counseling and prevention recommendations: -recommend heart healthy/Mediterranean diet, with whole grains, fruits, vegetable, fish, lean meats, nuts, and olive oil. Limit salt. -recommend moderate walking, 3-5 times/week for 30-50 minutes each session. Aim for at least 150 minutes.week. Goal should be pace of 3 miles/hours, or walking 1.5 miles in 30 minutes -recommend avoidance of tobacco products. Avoid excess alcohol. -ASCVD risk score: The 10-year ASCVD risk score (Arnett DK, et al., 2019) is: 4%   Values used to calculate the score:     Age: 34 years     Sex: Female     Is Non-Hispanic African American: No     Diabetic: No     Tobacco smoker: No     Systolic Blood Pressure: 409 mmHg     Is BP treated: Yes     HDL Cholesterol: 61 mg/dL     Total Cholesterol: 168 mg/dL    Plan for follow up: 1 year or sooner as needed  Buford Dresser, MD, PhD [place]   [facility]    Medication Adjustments/Labs and Tests Ordered: Current medicines are reviewed at length with the patient today.  Concerns regarding medicines are outlined above.  No orders of the defined types were placed in this encounter.  No orders of the defined types were placed in this encounter.  Patient Instructions   Medication Instructions:  Your Physician recommend you continue on your current medication as directed.    *If you need a refill on your cardiac medications before your next appointment, please call your pharmacy*   Lab Work: None ordered today  Testing/Procedures: None ordered today   Follow-Up: At [facility], you and your health needs are our priority.  As part of our continuing mission to provide you with exceptional heart care, we have created designated Provider Care Teams.  These Care Teams include your primary Cardiologist (physician) and Advanced Practice Providers (APPs -  Physician Assistants and Nurse Practitioners) who all work together to provide you with the care you need, when you need it.  We recommend signing up for the patient portal called "MyChart".  Sign up information is provided on this After Visit Summary.  MyChart is used to connect with patients for Virtual Visits (Telemedicine).  Patients are able to view lab/test results, encounter notes, upcoming appointments, etc.  Non-urgent messages can be sent to your provider as well.   To learn more about what you can do with MyChart, go to [email or web address].    Your next appointment:   1 year(s)  The format for your next appointment:   In Person  Provider:   Buford Dresser, MD       Signed, Buford Dresser, MD PhD 07/15/2021     [place]

## 2021-07-16 ENCOUNTER — Telehealth (INDEPENDENT_AMBULATORY_CARE_PROVIDER_SITE_OTHER): Payer: PPO | Admitting: Psychiatry

## 2021-07-16 ENCOUNTER — Encounter (HOSPITAL_COMMUNITY): Payer: Self-pay | Admitting: Psychiatry

## 2021-07-16 DIAGNOSIS — F331 Major depressive disorder, recurrent, moderate: Secondary | ICD-10-CM

## 2021-07-16 DIAGNOSIS — F3181 Bipolar II disorder: Secondary | ICD-10-CM | POA: Diagnosis not present

## 2021-07-16 DIAGNOSIS — F5102 Adjustment insomnia: Secondary | ICD-10-CM

## 2021-07-16 NOTE — Progress Notes (Signed)
[facility] Follow up visit   Patient Identification: Mary Mcfarland MRN:  [id number] Date of Evaluation:  07/16/2021 Referral Source: primary care Chief Complaint:  depression follow up  Visit Diagnosis:    ICD-10-CM   1. Bipolar 2 disorder (HCC)  F31.81     2. Adjustment insomnia  F51.02     3. MDD (major depressive disorder), recurrent episode, moderate ([place])  F33.1       Virtual Visit via Video Note  I connected with Mary Mcfarland on 07/16/21 at  9:00 AM EST by a video enabled telemedicine application and verified that I am speaking with the correct person using two identifiers.  Location: Patient: home Provider: home office   I discussed the limitations of evaluation and management by telemedicine and the availability of in person appointments. The patient expressed understanding and agreed to proceed.      I discussed the assessment and treatment plan with the patient. The patient was provided an opportunity to ask questions and all were answered. The patient agreed with the plan and demonstrated an understanding of the instructions.   The patient was advised to call back or seek an in-person evaluation if the symptoms worsen or if the condition fails to improve as anticipated.  I provided 15 minutes of non-face-to-face time during this encounter.     History of Present Illness: Patient is a 66 years old currently single Caucasian female initially referred by primary care physician and cardiology for management and establishment of depression She has been seeing a psychiatrist for many years he closed the practice and she started seeing a psychiatrist at [facility] this year till they closed.    Last visit has been doing better she continues take Lamictal she has been waiting for a BiPAP machine recently having upper chest congestion and has some difficult time getting the right doctor or to finally go to her PCP for virtual visit now on Augmentin doing a little bit  better overall Lamictal keeps a good balance on the mood  She is getting BiPAP machine and is looking forward to use it so that she can feel refreshed during the daytime and sleep better Modifying factors; son and grandkids she tries to keep her self busy     Aggravating factors; health issues Modifying factors; grandkids Severity; baseline   Patient denies drug use or alcohol use     Past Psychiatric History: Depression, possible bipolar  Previous Psychotropic Medications: Yes     Past Medical History:  Past Medical History:  Diagnosis Date   Asthma    Atrial fibrillation (HCC)    Back pain    Bipolar disorder ([place])    Constipation    Depression    Difficult intubation    Narrow airway   Heartburn    High cholesterol    Hypertension    Longstanding persistent atrial fibrillation (HCC)    Palpitations    Pinched vertebral nerve    sciatica   Prediabetes    Sleep apnea    sleep study was "inconclusive" per patient   SOB (shortness of breath)    Swelling of both lower extremities    Visit for monitoring Tikosyn therapy 08/27/2019   Vitamin D deficiency     Past Surgical History:  Procedure Laterality Date   bone spur surgery     CARDIOVERSION N/A 07/24/2019   Procedure: CARDIOVERSION;  Surgeon: Buford Dresser, MD;  Location: [place];  Service: Cardiovascular;  Laterality: N/A;   COLONOSCOPY  FOOT SURGERY  2017   HEEL SPUR RESECTION Left 03/15/2016   Procedure: ACHILLIES TENDON REPAIR AND PUMP BUMP EXCISION;  Surgeon: Melrose Nakayama, MD;  Location: [place];  Service: Orthopedics;  Laterality: Left;   PACEMAKER IMPLANT N/A 02/06/2020   Procedure: PACEMAKER IMPLANT;  Surgeon: Thompson Grayer, MD;  Location: [place] CV LAB;  Service: Cardiovascular;  Laterality: N/A;   SHOULDER SURGERY  2010   TOE SURGERY  2011   TONSILLECTOMY      Family Psychiatric History: Mom diagnosed with possible bipolar  Family History:  Family History  Problem  Relation Age of Onset   Heart disease Mother    CVA Mother    Hypertension Mother    Cancer Mother    Stroke Mother    Depression Mother    Bipolar disorder Mother    Obesity Mother    Heart disease Father    Hypertension Father    Heart attack Father    Diabetes Father    Liver disease Father    Alcoholism Father    Drug abuse Father    Obesity Father     Social History:   Social History   Socioeconomic History   Marital status: Divorced    Spouse name: Not on file   Number of children: Not on file   Years of education: Not on file   Highest education level: Not on file  Occupational History   Occupation: retired  Tobacco Use   Smoking status: Former    Packs/day: 1.00    Types: Cigarettes    Quit date: 02/13/1996    Years since quitting: 25.4   Smokeless tobacco: Never   Tobacco comments:    Former smoker 02/02/21  Vaping Use   Vaping Use: Never used  Substance and Sexual Activity   Alcohol use: Yes    Alcohol/week: 1.0 - 2.0 standard drink    Types: 1 - 2 Standard drinks or equivalent per week    Comment: occassionally   Drug use: Never   Sexual activity: Not on file  Other Topics Concern   Not on file  Social History Narrative   Lives in [place] alone   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)   Social Determinants of Health   Financial Resource Strain: Not on file  Food Insecurity: Not on file  Transportation Needs: Not on file  Physical Activity: Not on file  Stress: Not on file  Social Connections: Not on file     Allergies:   Allergies  Allergen Reactions   Niacin Hives    Metabolic Disorder Labs: Lab Results  Component Value Date   HGBA1C 6.1 (H) 01/14/2020   No results found for: PROLACTIN Lab Results  Component Value Date   CHOL 168 01/14/2020   TRIG 78 01/14/2020   HDL 61 01/14/2020   CHOLHDL 3.1 11/13/2019   LDLCALC 92 01/14/2020   LDLCALC 101 (H) 11/13/2019   Lab Results  Component Value Date   TSH 3.610 01/14/2020     Therapeutic Level Labs: No results found for: LITHIUM No results found for: CBMZ No results found for: VALPROATE  Current Medications: See chart    Psychiatric Specialty Exam: Review of Systems  Cardiovascular:  Negative for chest pain.  Psychiatric/Behavioral:  Positive for sleep disturbance. Negative for agitation and hallucinations.    Last menstrual period 09/24/2013.There is no height or weight on file to calculate BMI.  General Appearance: Casual  Eye Contact:  Fair  Speech:  Normal Rate  Volume:  Normal  Mood: Fair  Affect:  Congruent  Thought Process:  Goal Directed  Orientation:  Full (Time, Place, and Person)  Thought Content:  Rumination  Suicidal Thoughts:  No  Homicidal Thoughts:  No  Memory:  Immediate;   Fair Recent;   Fair  Judgement:  Fair  Insight:  Shallow  Psychomotor Activity:  decreased  Concentration:  Concentration: Fair and Attention Span: Fair  Recall:  [employer] of Knowledge:Fair  Language: Good  Akathisia:  No  Handed:   AIMS (if indicated):  not done  Assets:  Desire for Improvement Housing Social Support  ADL's:  Intact  Cognition: WNL  Sleep:   variable   Screenings: PHQ2-9    [place] Office Visit from 01/14/2020 in [place]  PHQ-2 Total Score 6  PHQ-9 Total Score 14      Flowsheet Row Video Visit from 07/16/2021 in [place] ED from 07/10/2021 in [facility] Office Visit from 03/02/2021 in [place] No Risk No Risk No Risk       Assessment and Plan: as follows Prior documentation reviewed Bipolar 2 ; depressed ; was getting edgy but increase Lamictal has helped continue 100 mg there is no reported side effects Sleep concerns; disturbed sleep is using a BiPAP soon once she gets it she understands she will have to get used to it reviewed sleep hygiene  Depression;  manageable continue Lamictal and Wellbutrin, no rash Insomnia: Poor but getting BiPAP machine and looking forward to that   3 to 4 months in office follow-up   Merian Capron, MD 2/3/20239:37 AM

## 2021-07-19 DIAGNOSIS — I10 Essential (primary) hypertension: Secondary | ICD-10-CM | POA: Diagnosis not present

## 2021-07-19 DIAGNOSIS — F319 Bipolar disorder, unspecified: Secondary | ICD-10-CM | POA: Diagnosis not present

## 2021-07-19 DIAGNOSIS — G4733 Obstructive sleep apnea (adult) (pediatric): Secondary | ICD-10-CM | POA: Diagnosis not present

## 2021-07-19 DIAGNOSIS — F32A Depression, unspecified: Secondary | ICD-10-CM | POA: Diagnosis not present

## 2021-07-23 DIAGNOSIS — R7303 Prediabetes: Secondary | ICD-10-CM | POA: Diagnosis not present

## 2021-07-23 DIAGNOSIS — E559 Vitamin D deficiency, unspecified: Secondary | ICD-10-CM | POA: Diagnosis not present

## 2021-07-23 DIAGNOSIS — E782 Mixed hyperlipidemia: Secondary | ICD-10-CM | POA: Diagnosis not present

## 2021-07-23 DIAGNOSIS — I10 Essential (primary) hypertension: Secondary | ICD-10-CM | POA: Diagnosis not present

## 2021-07-27 ENCOUNTER — Encounter: Payer: PPO | Admitting: Cardiology

## 2021-07-27 DIAGNOSIS — E559 Vitamin D deficiency, unspecified: Secondary | ICD-10-CM | POA: Diagnosis not present

## 2021-07-27 DIAGNOSIS — I10 Essential (primary) hypertension: Secondary | ICD-10-CM | POA: Diagnosis not present

## 2021-07-27 DIAGNOSIS — F3181 Bipolar II disorder: Secondary | ICD-10-CM | POA: Diagnosis not present

## 2021-07-27 DIAGNOSIS — J4520 Mild intermittent asthma, uncomplicated: Secondary | ICD-10-CM | POA: Diagnosis not present

## 2021-07-27 DIAGNOSIS — I4891 Unspecified atrial fibrillation: Secondary | ICD-10-CM | POA: Diagnosis not present

## 2021-07-27 DIAGNOSIS — M5430 Sciatica, unspecified side: Secondary | ICD-10-CM | POA: Diagnosis not present

## 2021-07-27 DIAGNOSIS — D6869 Other thrombophilia: Secondary | ICD-10-CM | POA: Diagnosis not present

## 2021-07-27 DIAGNOSIS — Z0000 Encounter for general adult medical examination without abnormal findings: Secondary | ICD-10-CM | POA: Diagnosis not present

## 2021-07-27 DIAGNOSIS — K219 Gastro-esophageal reflux disease without esophagitis: Secondary | ICD-10-CM | POA: Diagnosis not present

## 2021-07-27 DIAGNOSIS — R7303 Prediabetes: Secondary | ICD-10-CM | POA: Diagnosis not present

## 2021-07-27 DIAGNOSIS — G4700 Insomnia, unspecified: Secondary | ICD-10-CM | POA: Diagnosis not present

## 2021-07-27 DIAGNOSIS — E782 Mixed hyperlipidemia: Secondary | ICD-10-CM | POA: Diagnosis not present

## 2021-08-02 DIAGNOSIS — M79672 Pain in left foot: Secondary | ICD-10-CM | POA: Diagnosis not present

## 2021-08-05 DIAGNOSIS — G629 Polyneuropathy, unspecified: Secondary | ICD-10-CM | POA: Diagnosis not present

## 2021-08-09 ENCOUNTER — Ambulatory Visit (INDEPENDENT_AMBULATORY_CARE_PROVIDER_SITE_OTHER): Payer: PPO

## 2021-08-09 DIAGNOSIS — I495 Sick sinus syndrome: Secondary | ICD-10-CM

## 2021-08-09 LAB — CUP PACEART REMOTE DEVICE CHECK
Battery Remaining Longevity: 91 mo
Battery Remaining Percentage: 87 %
Battery Voltage: 3.02 V
Brady Statistic AP VP Percent: 39 %
Brady Statistic AP VS Percent: 15 %
Brady Statistic AS VP Percent: 13 %
Brady Statistic AS VS Percent: 23 %
Brady Statistic RA Percent Paced: 51 %
Brady Statistic RV Percent Paced: 52 %
Date Time Interrogation Session: 20230227022536
Implantable Lead Implant Date: 20210826
Implantable Lead Implant Date: 20210826
Implantable Lead Location: 753859
Implantable Lead Location: 753860
Implantable Pulse Generator Implant Date: 20210826
Lead Channel Impedance Value: 450 Ohm
Lead Channel Impedance Value: 580 Ohm
Lead Channel Pacing Threshold Amplitude: 0.75 V
Lead Channel Pacing Threshold Amplitude: 1 V
Lead Channel Pacing Threshold Pulse Width: 0.4 ms
Lead Channel Pacing Threshold Pulse Width: 0.5 ms
Lead Channel Sensing Intrinsic Amplitude: 3.1 mV
Lead Channel Sensing Intrinsic Amplitude: 5 mV
Lead Channel Setting Pacing Amplitude: 2 V
Lead Channel Setting Pacing Amplitude: 2.5 V
Lead Channel Setting Pacing Pulse Width: 0.4 ms
Lead Channel Setting Sensing Sensitivity: 2 mV
Pulse Gen Model: 2272
Pulse Gen Serial Number: 3859348

## 2021-08-12 ENCOUNTER — Telehealth: Payer: Self-pay | Admitting: Pulmonary Disease

## 2021-08-12 ENCOUNTER — Institutional Professional Consult (permissible substitution): Payer: PPO | Admitting: Pulmonary Disease

## 2021-08-12 NOTE — Telephone Encounter (Signed)
Called and spoke with patient and relayed Dr Lisabeth Pick message to her and told her that she is in a highly infectious period right now to transmit her pink eye. Got her rescheduled to  ? ?08/26/2021 ?30 minute slot ?CONSULT ? ?No questions noted from patient, Nothing further needed  ?

## 2021-08-12 NOTE — Telephone Encounter (Signed)
She should be rescheduled as she is currently in the infectious period and high risk for transmitting to others.  ? ?Thank you for notifying our office, we appreciate your honesty. ? ?She can be rescheduled with me 3/13 through 3/16 when I am back in clinic or if she needs to be seen sooner, she can be scheduled with another provider next week.  ? ?Hope she gets better soon. ? ?JD ?

## 2021-08-12 NOTE — Telephone Encounter (Signed)
Spoke with pt who states she woke up with pink eye this morning for which she starter taking ABT drops this morning as well. Dr. Erin Fulling pt is scheduled for a consult with you at 2:30pm. Please advise as to weather pt should come into office or reschedule.  ?

## 2021-08-13 NOTE — Progress Notes (Signed)
Remote pacemaker transmission.   

## 2021-08-16 DIAGNOSIS — F319 Bipolar disorder, unspecified: Secondary | ICD-10-CM | POA: Diagnosis not present

## 2021-08-16 DIAGNOSIS — G4733 Obstructive sleep apnea (adult) (pediatric): Secondary | ICD-10-CM | POA: Diagnosis not present

## 2021-08-16 DIAGNOSIS — F32A Depression, unspecified: Secondary | ICD-10-CM | POA: Diagnosis not present

## 2021-08-16 DIAGNOSIS — I10 Essential (primary) hypertension: Secondary | ICD-10-CM | POA: Diagnosis not present

## 2021-08-17 ENCOUNTER — Other Ambulatory Visit (HOSPITAL_COMMUNITY): Payer: Self-pay | Admitting: Psychiatry

## 2021-08-23 ENCOUNTER — Encounter: Payer: Self-pay | Admitting: Internal Medicine

## 2021-08-23 ENCOUNTER — Emergency Department (HOSPITAL_COMMUNITY): Payer: PPO

## 2021-08-23 ENCOUNTER — Emergency Department (HOSPITAL_COMMUNITY)
Admission: EM | Admit: 2021-08-23 | Discharge: 2021-08-24 | Disposition: A | Discharge status: Home / Self Care | Payer: PPO | Attending: Emergency Medicine | Admitting: Emergency Medicine

## 2021-08-23 DIAGNOSIS — M25561 Pain in right knee: Secondary | ICD-10-CM | POA: Insufficient documentation

## 2021-08-23 DIAGNOSIS — R079 Chest pain, unspecified: Secondary | ICD-10-CM

## 2021-08-23 DIAGNOSIS — M25562 Pain in left knee: Secondary | ICD-10-CM | POA: Diagnosis not present

## 2021-08-23 DIAGNOSIS — I4891 Unspecified atrial fibrillation: Secondary | ICD-10-CM | POA: Insufficient documentation

## 2021-08-23 DIAGNOSIS — Z950 Presence of cardiac pacemaker: Secondary | ICD-10-CM | POA: Diagnosis not present

## 2021-08-23 DIAGNOSIS — R0789 Other chest pain: Secondary | ICD-10-CM | POA: Insufficient documentation

## 2021-08-23 DIAGNOSIS — Z79899 Other long term (current) drug therapy: Secondary | ICD-10-CM | POA: Insufficient documentation

## 2021-08-23 DIAGNOSIS — Z7901 Long term (current) use of anticoagulants: Secondary | ICD-10-CM | POA: Diagnosis not present

## 2021-08-23 DIAGNOSIS — I10 Essential (primary) hypertension: Secondary | ICD-10-CM | POA: Insufficient documentation

## 2021-08-23 LAB — BASIC METABOLIC PANEL
Anion gap: 12 (ref 5–15)
BUN: 26 mg/dL — ABNORMAL HIGH (ref 8–23)
CO2: 26 mmol/L (ref 22–32)
Calcium: 9.4 mg/dL (ref 8.9–10.3)
Chloride: 100 mmol/L (ref 98–111)
Creatinine, Ser: 0.9 mg/dL (ref 0.44–1.00)
GFR, Estimated: >60 mL/min (ref >60)
Glucose, Bld: 107 mg/dL — ABNORMAL HIGH (ref 70–99)
Potassium: 4.1 mmol/L (ref 3.5–5.1)
Sodium: 138 mmol/L (ref 135–145)

## 2021-08-23 LAB — CBC
HCT: 42.3 % (ref 36.0–46.0)
Hemoglobin: 13.4 g/dL (ref 12.0–15.0)
MCH: 27.7 pg (ref 26.0–34.0)
MCHC: 31.7 g/dL (ref 30.0–36.0)
MCV: 87.6 fL (ref 80.0–100.0)
Platelets: 342 10*3/uL (ref 150–400)
RBC: 4.83 MIL/uL (ref 3.87–5.11)
RDW: 13.8 % (ref 11.5–15.5)
WBC: 14.9 10*3/uL — ABNORMAL HIGH (ref 4.0–10.5)
nRBC: 0 % (ref 0.0–0.2)

## 2021-08-23 LAB — TROPONIN I (HIGH SENSITIVITY)
Troponin I (High Sensitivity): 7 ng/L (ref <18)
Troponin I (High Sensitivity): 8 ng/L (ref <18)

## 2021-08-23 MED ORDER — OXYCODONE-ACETAMINOPHEN 5-325 MG PO TABS
1.0000 | ORAL_TABLET | Freq: Once | ORAL | Status: AC
  Administered 2021-08-24: 1 via ORAL
  Filled 2021-08-23: qty 1

## 2021-08-23 NOTE — ED Triage Notes (Signed)
Pt c/o non-radiating discomfort onset 1500, "never felt pain like this." Pacemaker in place for afib, on eliquis for same. Nothing for pain, no associated symptoms ? ?Pt c/o L knee pain, brace in place.  ? ?160/98 ?19 ?

## 2021-08-23 NOTE — ED Provider Triage Note (Signed)
Emergency Medicine Provider Triage Evaluation Note ? ?Deretha Emory , a 66 y.o. female  was evaluated in triage.  Pt complains of chest pain that has been intermittent for the last few hours. 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 Denies new sob ? ?Review of Systems  ?Positive: Chest pain ?Negative: Fever, sob ? ?Physical Exam  ?BP (!) 147/76   Pulse 93   Temp 99.1 ?F (37.3 ?C) (Oral)   Resp 16   LMP 09/24/2013   SpO2 100%  ?Gen:   Awake, no distress   ?Resp:  Normal effort  ?MSK:   Moves extremities without difficulty  ?Other:  Heart with rrr, lungs ctab ? ?Medical Decision Making  ?Medically screening exam initiated at 6:59 PM.  Appropriate orders placed.  Liliann File Harwell-Lamb was informed that the remainder of the evaluation will be completed by another provider, this initial triage assessment does not replace that evaluation, and the importance of remaining in the ED until their evaluation is complete. ? ? ?  ?Rodney Booze, PA-C ?08/23/21 1859 ? ?

## 2021-08-23 NOTE — Telephone Encounter (Signed)
Spoke with patient she stated she was not at home near her machine to send a transmission, patient stated she was having some sharp pains in her chest, patient stated that she was going to be seen in the emergency room.  ?

## 2021-08-24 DIAGNOSIS — R0789 Other chest pain: Secondary | ICD-10-CM | POA: Diagnosis not present

## 2021-08-24 MED ORDER — OXYCODONE-ACETAMINOPHEN 5-325 MG PO TABS: 1.0000 | ORAL_TABLET | Freq: Three times a day (TID) | ORAL | 0 refills | Status: AC | PRN

## 2021-08-24 NOTE — ED Notes (Signed)
Pt refuses interrogation of pacemaker and further care, wants to leave. Pt verbalized understanding of d/c instructions, meds, and followup care. Denies questions. VSS, no distress noted. Steady gait to exit with all belongings.  ?

## 2021-08-24 NOTE — Discharge Instructions (Signed)
Knee pain-I have given you some pain medications please take as prescribed, also recommended keeping it in a brace,  elevated when not in use apply ice and or heat over the area this help with your pain.I have given you a short course of narcotics please take as prescribed.  This medication can make you drowsy do not consume alcohol or operate heavy machinery when taking this medication.  This medication is Tylenol in it do not take Tylenol and take this medication.  I have also given you a prescription for a muscle relaxer this can make you drowsy do not consume alcohol or operate heavy machinery when taking this medication.  ? ? ?2. Chest pain-please continue with all home medications, I would like you to follow-up with your cardiologist, if your symptoms worsen or come back I would like to come back in for reevaluation. ? ? ?Come back to the emergency department if you develop chest pain, shortness of breath, severe abdominal pain, uncontrolled nausea, vomiting, diarrhea. ? ?

## 2021-08-24 NOTE — ED Provider Notes (Signed)
?[facility] ?Provider Note ? ? ?CSN: [id number] ?Arrival date & time: 08/23/21  1837 ? ?  ? ?History ? ?Chief Complaint  ?Patient presents with  ? Chest Pain  ? Knee Pain  ? ? ?Mary Mcfarland is a 66 y.o. female. ? ?HPI ? ?Patient with medical history including A-fib currently on Eliquis, hypertension, has a pacemaker presents  with complaints of left-sided chest pain.  Patient states chest pain started at 4 PM today, states that she was standing and folding laundry when she started to have left-sided chest pain, described as a sharp stabbing-like sensation, will last approximately 30 to 45 seconds and resolve on its own.  She states this will come and go, exertion, movement, position or p.o. intake does not affect patient's pain.  She did not become diaphoretic, nausea, vomiting, lightheaded or dizziness, pain does not radiate, denies a stabbing or tearing like station to her back, states that she is never had this pain in the past she has no history of PEs or DVTs, states she been compliant with all home medications.  Patient states that she currently has no pain at this time states that she has not had this pain for over 1 hour.  She also notes that she is having some left knee pain, patient says she hurt her knee 1 week ago after weight lifting states that she tweaked it while she was in an ambulance today, pain is mainly on the lateral aspect of her knee does not radiate is worsened with movement improved with rest she is seen orthopedic doctor tomorrow for further evaluation of this. ? ?Home Medications ?Prior to Admission medications   ?Medication Sig Start Date End Date Taking? Authorizing Provider  ?oxyCODONE-acetaminophen (PERCOCET/ROXICET) 5-325 MG tablet Take 1 tablet by mouth every 8 (eight) hours as needed for up to 3 days for severe pain. 08/24/21 08/27/21 Yes Marcello Fennel, PA-C  ?acetaminophen (TYLENOL) 325 MG tablet Take 2 tablets (650 mg total) by  mouth every 6 (six) hours as needed for fever, headache or mild pain. 0/62/37   Lianne Cure, DO  ?albuterol (PROVENTIL HFA;VENTOLIN HFA) 108 (90 Base) MCG/ACT inhaler Inhale 2 puffs into the lungs every 6 (six) hours as needed for wheezing or shortness of breath.    [provider]  ?amoxicillin-clavulanate (AUGMENTIN) 875-125 MG tablet Take 1 tablet by mouth 2 (two) times daily. 07/13/21   [provider]  ?atorvastatin (LIPITOR) 40 MG tablet TAKE ONE TABLET BY MOUTH ONCE DAILY 03/01/21   Buford Dresser, MD  ?buPROPion [facility] SR) 150 MG 12 hr tablet Take 300 mg by mouth daily.    [provider]  ?diltiazem (CARDIZEM CD) 120 MG 24 hr capsule Take 120 mg by mouth daily.    [provider]  ?dofetilide (TIKOSYN) 250 MCG capsule TAKE ONE CAPSULE BY MOUTH TWICE A DAY 06/16/21   Allred, Jeneen Rinks, MD  ?ELIQUIS 5 MG TABS tablet TAKE ONE TABLET BY MOUTH TWICE DAILY 05/24/21   Allred, Jeneen Rinks, MD  ?furosemide (LASIX) 40 MG tablet Take 1 tablet (40 mg total) by mouth 2 (two) times daily. Take afternoon dose only as needed. 11/30/20 07/15/21  Buford Dresser, MD  ?lamoTRIgine (LAMICTAL) 100 MG tablet Take 1 tablet (100 mg total) by mouth daily. 08/17/21   Merian Capron, MD  ?Levocetirizine Dihydrochloride (XYZAL PO) Take 1 tablet by mouth daily as needed.    [provider]  ?lisinopril (ZESTRIL) 10 MG tablet Take 10 mg by mouth  daily. 01/21/21   [provider]  ?Naphazoline-Pheniramine (OPCON-A) 0.027-0.315 % SOLN Place 1-2 drops into both eyes 3 (three) times daily as needed (for allergies).     [provider]  ?omeprazole (PRILOSEC) 20 MG capsule Take 20 mg by mouth daily before breakfast.     [provider]  ?valACYclovir (VALTREX) 1000 MG tablet Take 1 tablet (1,000 mg total) by mouth 3 (three) times daily. 07/28/20   Brunetta Jeans, PA-C  ?zolpidem (AMBIEN) 10 MG tablet 1/2-1 tablet at bedtime as needed 08/04/20   [provider]  ?   ? ?Allergies    ?Niacin   ? ?Review of Systems   ?Review of Systems  ?Constitutional:  Negative for chills and fever.  ?Respiratory:  Negative for shortness of breath.   ?Cardiovascular:  Positive for chest pain.  ?Gastrointestinal:  Negative for abdominal pain.  ?Musculoskeletal:   ?     Left knee pain  ?Neurological:  Negative for headaches.  ? ?Physical Exam ?Updated Vital Signs ?BP 135/72   Pulse 82   Temp 98.7 ?F (37.1 ?C) (Oral)   Resp 16   LMP 09/24/2013   SpO2 98%  ?Physical Exam ?Vitals and nursing note reviewed.  ?Constitutional:   ?   General: She is not in acute distress. ?   Appearance: She is not ill-appearing.  ?HENT:  ?   Head: Normocephalic and atraumatic.  ?   Nose: No congestion.  ?Eyes:  ?   Conjunctiva/sclera: Conjunctivae normal.  ?Cardiovascular:  ?   Rate and Rhythm: Normal rate and regular rhythm.  ?   Pulses: Normal pulses.  ?   Heart sounds: No murmur heard. ?  No friction rub. No gallop.  ?Pulmonary:  ?   Effort: No respiratory distress.  ?   Breath sounds: No wheezing, rhonchi or rales.  ?Musculoskeletal:  ?   Comments: No overlying skin change present on patient's left knee no edema present, she has full range of motion at her ankle and knee, she is tender to palpation on the lateral aspect of the tibial plateau no crepitus or deformities noted, neurovascular fully intact.  She had no tenderness along the posterior of her leg nor her calf no palpable cords.  ?Skin: ?   General: Skin is warm and dry.  ?Neurological:  ?   Mental Status: She is alert.  ?Psychiatric:     ?   Mood and Affect: Mood normal.  ? ? ?ED Results / Procedures / Treatments   ?Labs ?(all labs ordered are listed, but only abnormal results are displayed) ?Labs Reviewed  ?BASIC METABOLIC PANEL - Abnormal; Notable for the following components:  ?    Result Value  ? Glucose, Bld 107 (*)   ? BUN 26 (*)   ? All other components within normal limits  ?CBC - Abnormal; Notable for the following components:  ? WBC  14.9 (*)   ? All other components within normal limits  ?TROPONIN I (HIGH SENSITIVITY)  ?TROPONIN I (HIGH SENSITIVITY)  ? ? ?EKG ?None ? ?Radiology ?DG Chest 1 View ? ?Result Date: 08/23/2021 ?CLINICAL DATA:  Nonradiating chest pain since 3 p.m., history of atrial fibrillation EXAM: CHEST  1 VIEW COMPARISON:  02/07/2020 FINDINGS: Single frontal view of the chest demonstrates stable dual lead pacer overlying left chest. Cardiac silhouette is unremarkable. No airspace disease, effusion, or pneumothorax. No acute bony abnormalities. IMPRESSION: 1. No acute intrathoracic process. Electronically Signed   By: Diana Eves.D.  On: 08/23/2021 19:28   ? ?Procedures ?Procedures  ? ? ?Medications Ordered in ED ?Medications  ?oxyCODONE-acetaminophen (PERCOCET/ROXICET) 5-325 MG per tablet 1 tablet (1 tablet Oral Given 08/24/21 0037)  ? ? ?ED Course/ Medical Decision Making/ A&P ?  ?                        ?Medical Decision Making ?Risk ?Prescription drug management. ? ? ?This patient presents to the ED for concern of left knee pain chest pain, this involves an extensive number of treatment options, and is a complaint that carries with it a high risk of complications and morbidity.  The differential diagnosis includes fracture, dislocation, ACS, PE, dissection ? ? ? ?Additional history obtained: ? ?Additional history obtained from daughter who is at bedside ?External records from outside source obtained and reviewed including N/A ? ? ?Co morbidities that complicate the patient evaluation ? ?Hypertension, A-fib ? ?Social Determinants of Health: ? ?N/A ? ? ? ?Lab Tests: ? ?I Ordered, and personally interpreted labs.  The pertinent results include: CBC shows slight leukocytosis of 14.9, BMP shows glucose of 106 BUN 26 negative delta troponin ? ? ?Imaging Studies ordered: ? ?I ordered imaging studies including chest x-ray ?I independently visualized and interpreted imaging which showed unremarkable ?I agree with the radiologist  interpretation ? ? ?Cardiac Monitoring: ? ?The patient was maintained on a cardiac monitor.  I personally viewed and interpreted the cardiac monitored which showed an underlying rhythm of: EKG without signs of

## 2021-08-25 DIAGNOSIS — M1712 Unilateral primary osteoarthritis, left knee: Secondary | ICD-10-CM | POA: Diagnosis not present

## 2021-08-26 ENCOUNTER — Encounter: Payer: Self-pay | Admitting: Cardiology

## 2021-08-26 ENCOUNTER — Institutional Professional Consult (permissible substitution): Payer: PPO | Admitting: Pulmonary Disease

## 2021-08-26 ENCOUNTER — Telehealth (HOSPITAL_COMMUNITY): Payer: Self-pay | Admitting: Psychiatry

## 2021-08-26 NOTE — Telephone Encounter (Signed)
Pt needs a letter stating that she cannot do jury duty ?

## 2021-08-27 DIAGNOSIS — E782 Mixed hyperlipidemia: Secondary | ICD-10-CM | POA: Diagnosis not present

## 2021-08-27 DIAGNOSIS — I10 Essential (primary) hypertension: Secondary | ICD-10-CM | POA: Diagnosis not present

## 2021-08-27 DIAGNOSIS — F331 Major depressive disorder, recurrent, moderate: Secondary | ICD-10-CM | POA: Diagnosis not present

## 2021-08-30 ENCOUNTER — Encounter (HOSPITAL_BASED_OUTPATIENT_CLINIC_OR_DEPARTMENT_OTHER): Payer: Self-pay | Admitting: Cardiology

## 2021-08-30 ENCOUNTER — Other Ambulatory Visit: Payer: Self-pay

## 2021-08-30 ENCOUNTER — Ambulatory Visit (HOSPITAL_BASED_OUTPATIENT_CLINIC_OR_DEPARTMENT_OTHER): Payer: PPO | Admitting: Cardiology

## 2021-08-30 ENCOUNTER — Encounter (HOSPITAL_COMMUNITY): Payer: Self-pay | Admitting: Psychiatry

## 2021-08-30 ENCOUNTER — Telehealth: Payer: Self-pay | Admitting: Cardiology

## 2021-08-30 VITALS — BP 126/66 | HR 77 | Ht 65.0 in | Wt 265.8 lb

## 2021-08-30 DIAGNOSIS — Z7901 Long term (current) use of anticoagulants: Secondary | ICD-10-CM | POA: Diagnosis not present

## 2021-08-30 DIAGNOSIS — I480 Paroxysmal atrial fibrillation: Secondary | ICD-10-CM | POA: Diagnosis not present

## 2021-08-30 DIAGNOSIS — R0789 Other chest pain: Secondary | ICD-10-CM | POA: Diagnosis not present

## 2021-08-30 DIAGNOSIS — D6869 Other thrombophilia: Secondary | ICD-10-CM | POA: Diagnosis not present

## 2021-08-30 DIAGNOSIS — Z09 Encounter for follow-up examination after completed treatment for conditions other than malignant neoplasm: Secondary | ICD-10-CM | POA: Diagnosis not present

## 2021-08-30 NOTE — Telephone Encounter (Signed)
Called pt and informed her that message has been sent to Dr. Harrell Gave last week as well as today. ?Pt states she is on hold with our office to leave a message with Dr. Harrell Gave on this matter. ? ?

## 2021-08-30 NOTE — Patient Instructions (Addendum)
Medication Instructions:  ?Your physician recommends that you continue on your current medications as directed. Please refer to the Current Medication list given to you today. ?  ?*If you need a refill on your cardiac medications before your next appointment, please call your pharmacy* ? ?Lab Work: ?NONE  ? ?Testing/Procedures: ?NONE ? ?Follow-Up: ?At [facility], you and your health needs are our priority.  As part of our continuing mission to provide you with exceptional heart care, we have created designated Provider Care Teams.  These Care Teams include your primary Cardiologist (physician) and Advanced Practice Providers (APPs -  Physician Assistants and Nurse Practitioners) who all work together to provide you with the care you need, when you need it. ? ?We recommend signing up for the patient portal called "MyChart".  Sign up information is provided on this After Visit Summary.  MyChart is used to connect with patients for Virtual Visits (Telemedicine).  Patients are able to view lab/test results, encounter notes, upcoming appointments, etc.  Non-urgent messages can be sent to your provider as well.   ?To learn more about what you can do with MyChart, go to [email or web address].   ? ?Your next appointment:   ? ?BEGINNING OF 2024 ?

## 2021-08-30 NOTE — Progress Notes (Signed)
?Cardiology Office Note:   ? ?Date:  08/30/2021  ? ?ID:  Mary Mcfarland, DOB 1956/01/17, MRN [id number] ? ?PCP:  Lawerance Cruel, MD  ?Cardiologist:  Buford Dresser, MD  ?EP: Dr. Rayann Heman ? ?Referring MD: Lawerance Cruel, MD  ? ?CC: follow up ? ?History of Present Illness:   ? ?Mary Mcfarland is a 66 y.o. female with a hx of atrial fibrillation, sinus pauses s/p PPM, hypertension, hyperlipidemia, obesity who is seen for follow up today. I initially met her 12/30/19. ? ?Today: ?Since her last visit she presented to the ED 08/23/2021 complaining of sharp, stabbing left-sided chest pain. Her chest pain resolved in the ED, but etiology remained unclear. She was recommended for cardiology outpatient follow-up due to her cardiac history. Symptoms occurred while standing and doing the laundry. She reports the work-up was normal, and her chest has not recurred since then. ? ?She is scheduled to see a pulmonologist regarding her shortness of breath. ? ?At this time she continues to work on weight loss. She joined the [employer]. Unfortunately she suffered a recent injury while weight lifting and has left knee pain. She has followed up with orthopedics. Also she is going to meet with a dietician soon. ? ?She denies any palpitations, or peripheral edema. No lightheadedness, headaches, syncope, orthopnea, or PND. ? ? ?Past Medical History:  ?Diagnosis Date  ? Asthma   ? Atrial fibrillation ([place])   ? Back pain   ? Bipolar disorder ([place])   ? Constipation   ? Depression   ? Difficult intubation   ? Narrow airway  ? Heartburn   ? High cholesterol   ? Hypertension   ? Longstanding persistent atrial fibrillation ([place])   ? Palpitations   ? Pinched vertebral nerve   ? sciatica  ? Prediabetes   ? Sleep apnea   ? sleep study was "inconclusive" per patient  ? SOB (shortness of breath)   ? Swelling of both lower extremities   ? Visit for monitoring Tikosyn therapy 08/27/2019  ? Vitamin D deficiency    ? ? ?Past Surgical History:  ?Procedure Laterality Date  ? bone spur surgery    ? CARDIOVERSION N/A 07/24/2019  ? Procedure: CARDIOVERSION;  Surgeon: Buford Dresser, MD;  Location[facility] ENDOSCOPY;  Service: Cardiovascular;  Laterality: N/A;  ? COLONOSCOPY    ? FOOT SURGERY  2017  ? HEEL SPUR RESECTION Left 03/15/2016  ? Procedure: ACHILLIES TENDON REPAIR AND PUMP BUMP EXCISION;  Surgeon: Melrose Nakayama, MD;  Location: [facility];  Service: Orthopedics;  Laterality: Left;  ? PACEMAKER IMPLANT N/A 02/06/2020  ? Procedure: PACEMAKER IMPLANT;  Surgeon: Thompson Grayer, MD;  Location: [facility];  Service: Cardiovascular;  Laterality: N/A;  ? SHOULDER SURGERY  2010  ? TOE SURGERY  2011  ? TONSILLECTOMY    ? ? ?Current Medications: (ambmed) ?Current Outpatient Medications on File Prior to Visit  ?Medication Sig  ? albuterol (PROVENTIL HFA;VENTOLIN HFA) 108 (90 Base) MCG/ACT inhaler Inhale 2 puffs into the lungs every 6 (six) hours as needed for wheezing or shortness of breath.  ? apixaban (ELIQUIS) 5 MG TABS tablet Take 1 tablet (5 mg total) by mouth 2 (two) times daily.  ? atorvastatin (LIPITOR) 40 MG tablet Take 1 tablet (40 mg total) by mouth daily.  ? buPROPion (WELLBUTRIN SR) 150 MG 12 hr tablet Take 300 mg by mouth daily.  ? diltiazem (CARDIZEM CD) 120 MG 24 hr capsule TAKE 1 CAPSULE BY MOUTH ONCE DAILY  STOP METOPROLOL   ? dofetilide (TIKOSYN) 250 MCG capsule Take 1 capsule (250 mcg total) by mouth 2 (two) times daily.  ? furosemide (LASIX) 40 MG tablet Take 1 tablet (40 mg total) by mouth 2 (two) times daily. Take afternoon dose only as needed.  ? ibuprofen (ADVIL) 800 MG tablet Take 800 mg by mouth every 8 (eight) hours as needed for moderate pain.   ? lisinopril (ZESTRIL) 20 MG tablet Take 1 tablet by mouth once daily  ? Naphazoline-Pheniramine (OPCON-A) 0.027-0.315 % SOLN Place 1-2 drops into both eyes 3 (three) times daily as needed (for allergies).   ? omeprazole (PRILOSEC) 20 MG capsule Take 20 mg by  mouth daily before breakfast.   ? ?Allergies:   Niacin  ? ?Social History  ? ?Tobacco Use  ? Smoking status: Former  ?  Packs/day: 1.00  ?  Types: Cigarettes  ?  Quit date: 02/13/1996  ?  Years since quitting: 25.5  ? Smokeless tobacco: Never  ? Tobacco comments:  ?  Former smoker 02/02/21  ?Vaping Use  ? Vaping Use: Never used  ?Substance Use Topics  ? Alcohol use: Yes  ?  Alcohol/week: 1.0 - 2.0 standard drink  ?  Types: 1 - 2 Standard drinks or equivalent per week  ?  Comment: occassionally  ? Drug use: Never  ? ? ?Family History: ?family history includes Alcoholism in her father; Bipolar disorder in her mother; CVA in her mother; Cancer in her mother; Depression in her mother; Diabetes in her father; Drug abuse in her father; Heart attack in her father; Heart disease in her father and mother; Hypertension in her father and mother; Liver disease in her father; Obesity in her father and mother; Stroke in her mother. ? ?ROS:   ?Please see the history of present illness.   ?(+) Chest pain ?(+) Shortness of breath ?(+) Left knee pain ?Additional pertinent ROS otherwise unremarkable. ? ?EKGs/Labs/Other Studies Reviewed:   ? ?The following studies were reviewed today: ? ?Lexiscan Myoview 02/28/2020: ?Lexiscan stress is electrically negative for ischemia ?Myovue scan shows normal perfusion ?LVEf 67% ?This is a low risk study. ? ?Pacemaker Implant 02/06/2020: ? 1. Successful implantation of a [place] MRI conditional  dual-chamber pacemaker for symptomatic sinus bradycardia ? 2. No early apparent complications.  ?   ?Monitor 01/15/2020: ?Sinus rhythm ?Episodes of daytime sinus bradycardia, accelerated idioventricular rhythm, and sinus pauses are noted ?Frequent premature atrial contractions and nonsustained atrial tachycardia are noted ? ?-Echo 06/2019: EF 60-65%, indeterminate DF, no significant valve disease. LA diameter 4.5 cm ?-Ca score 06/2019: Ca score 22. Reviewed what this means ?-attempted CT coronary  12/18/19, highly variable heart rate, test cancelled. ? ?EKG:  EKG is personally reviewed.   ?08/30/2021: not ordered today ?01/12/2021: NSR with 1st degree AV block, RBBB, occ v pacing ?10/29/2020: SR, frequent pacing, RBBB ?02/25/20: atrial paced rhythm, RBBB, 70 bpm ? ?Recent Labs: ?04/19/2021: Magnesium 2.1 ?08/23/2021: BUN 26; Creatinine, Ser 0.90; Hemoglobin 13.4; Platelets 342; Potassium 4.1; Sodium 138  ? ?Recent Lipid Panel ?   ?Component Value Date/Time  ? CHOL 168 01/14/2020 1212  ? TRIG 78 01/14/2020 1212  ? HDL 61 01/14/2020 1212  ? CHOLHDL 3.1 11/13/2019 0843  ? [place] 92 01/14/2020 1212  ? ? ?Physical Exam:   ? ?VS:  BP 126/66 (BP Location: Left Arm, Patient Position: Sitting, Cuff Size: Large)   Pulse 77   Ht _0  (1.651 m)   Wt 265 lb 12.8 oz (120.6  kg)   LMP 09/24/2013   SpO2 98%   BMI 44.23 kg/m?    ? ?Wt Readings from Last 3 Encounters:  ?08/30/21 265 lb 12.8 oz (120.6 kg)  ?07/15/21 270 lb 9.6 oz (122.7 kg)  ?04/19/21 269 lb (122 kg)  ?  ?GEN: Well nourished, well developed in no acute distress ?HEENT: Normal, moist mucous membranes ?NECK: No JVD ?CARDIAC: regular rhythm, normal S1 and S2, no rubs or gallops. No murmur. ?VASCULAR: Radial and DP pulses 2+ bilaterally. No carotid bruits ?RESPIRATORY:  Clear to auscultation without rales, wheezing or rhonchi  ?ABDOMEN: Soft, non-tender, non-distended ?MUSCULOSKELETAL:  Ambulates independently ?SKIN: Warm and dry, no significant LE edema ?NEUROLOGIC:  Alert and oriented x 3. No focal neuro deficits noted. ?PSYCHIATRIC:  Normal affect   ? ?ASSESSMENT:   ? ?1. Atypical chest pain   ?2. Cardiology follow-up encounter   ?3. Paroxysmal atrial fibrillation (HCC)   ?4. Secondary hypercoagulable state ([place])   ?5. Long term current use of anticoagulant   ?6. Morbid obesity ([place])   ? ? ?PLAN:   ? ?Obesity, BMI 45 ?Abnormal glucose handling, A1c 6.1 ?-working with [facility] ?-we have discussed [place]. Discussing options with her PCP Dr. Harrington Challenger as well ? ?Atypical  chest pain: ?-troponins unremarkable ?-low risk pain by history ?-reviewed extensive prior cardiac workup. Do not think additional cardiac eval for chest pain necessary at this time ?-reviewed red flag warning signs t

## 2021-08-30 NOTE — Telephone Encounter (Signed)
She was added on to see me today, will address at visit.

## 2021-08-30 NOTE — Telephone Encounter (Signed)
Scheduled patient a visit for this afternoon at 1:40 pm due to hospital follow up on 3/13 and chest wall pain ?

## 2021-08-30 NOTE — Telephone Encounter (Signed)
Also forwarding to Dr. Quentin Ore & his nurse, as pt is to establish with him ?

## 2021-08-31 NOTE — Telephone Encounter (Signed)
Emailed letter to patient per her request to [email or web address]

## 2021-09-02 ENCOUNTER — Other Ambulatory Visit (HOSPITAL_COMMUNITY): Payer: Self-pay | Admitting: Psychiatry

## 2021-09-03 DIAGNOSIS — Z6841 Body Mass Index (BMI) 40.0 and over, adult: Secondary | ICD-10-CM | POA: Diagnosis not present

## 2021-09-03 DIAGNOSIS — I10 Essential (primary) hypertension: Secondary | ICD-10-CM | POA: Diagnosis not present

## 2021-09-03 DIAGNOSIS — E782 Mixed hyperlipidemia: Secondary | ICD-10-CM | POA: Diagnosis not present

## 2021-09-08 DIAGNOSIS — M1712 Unilateral primary osteoarthritis, left knee: Secondary | ICD-10-CM | POA: Diagnosis not present

## 2021-09-09 ENCOUNTER — Ambulatory Visit (INDEPENDENT_AMBULATORY_CARE_PROVIDER_SITE_OTHER): Payer: PPO | Admitting: Pulmonary Disease

## 2021-09-09 ENCOUNTER — Encounter: Payer: Self-pay | Admitting: Pulmonary Disease

## 2021-09-09 VITALS — BP 118/80 | HR 99 | Ht 65.0 in | Wt 268.8 lb

## 2021-09-09 DIAGNOSIS — R0602 Shortness of breath: Secondary | ICD-10-CM

## 2021-09-09 NOTE — Patient Instructions (Signed)
Shortness of breath ?--ORDER pulmonary function tests ?--CONTINUE Albuterol AS NEEDED for shortness of breath or wheezing ? ?Follow-up with me after PFTs in April or May. Ok to separate dates if needed ? ?  ?

## 2021-09-09 NOTE — Progress Notes (Signed)
? ? ?Subjective:  ? ?PATIENT ID: Mary Mcfarland GENDER: female DOB: September 04, 1955, MRN: [id number] ? ? ?HPI ? ?Chief Complaint  ?Patient presents with  ? Consult  ?  sob  ? ? ?Reason for Visit: New consult for asthma ? ?Mary Mcfarland is a 66 year old female former smoker with childhood asthma, OSA, atrial flutter/fibrillation, sinus pauses s/p PM 2021 , HLD and GERD who presents for evaluation for asthma. ? ?Faxed records from [facility] reviewed. Dr. Harrington Challenger note on 07/27/21 reports persistent dyspnea. Prescribed albuterol ? ?She reports longstanding shortness of breath that has persisted even after her PM.  Five years ago she reports normal activity and being able to walk upstairs. Very rarely used her albuterol inhaler. Is not on any maintenance inhalers. Denies coughing and wheezing. But since her PM her activity is more limited. Worsened around animal dander, pollen. She is currently not active due to knee pain.  ? ?   ? View : No data to display.  ?  ?  ?  ? ?Social History: ?Former smoker. Smoked 1 ppd. Started at 33 and stopped at 40 years. Quit in 25 years ago ? ?I have personally reviewed patient's past medical/family/social history, allergies, current medications. ? ?Past Medical History:  ?Diagnosis Date  ? Asthma   ? Atrial fibrillation ([place])   ? Back pain   ? Bipolar disorder ([place])   ? Constipation   ? Depression   ? Difficult intubation   ? Narrow airway  ? Heartburn   ? High cholesterol   ? Hypertension   ? Longstanding persistent atrial fibrillation ([place])   ? Palpitations   ? Pinched vertebral nerve   ? sciatica  ? Prediabetes   ? Sleep apnea   ? sleep study was "inconclusive" per patient  ? SOB (shortness of breath)   ? Swelling of both lower extremities   ? Visit for monitoring Tikosyn therapy 08/27/2019  ? Vitamin D deficiency   ?  ? ?Family History  ?Problem Relation Age of Onset  ? Heart disease Mother   ? CVA Mother   ? Hypertension Mother   ? Cancer Mother   ? Stroke Mother    ? Depression Mother   ? Bipolar disorder Mother   ? Obesity Mother   ? Heart disease Father   ? Hypertension Father   ? Heart attack Father   ? Diabetes Father   ? Liver disease Father   ? Alcoholism Father   ? Drug abuse Father   ? Obesity Father   ?  ? ?Social History  ? ?Occupational History  ? Occupation: retired  ?Tobacco Use  ? Smoking status: Former  ?  Packs/day: 1.00  ?  Types: Cigarettes  ?  Quit date: 02/13/1996  ?  Years since quitting: 25.5  ? Smokeless tobacco: Never  ? Tobacco comments:  ?  Former smoker 02/02/21  ?Vaping Use  ? Vaping Use: Never used  ?Substance and Sexual Activity  ? Alcohol use: Yes  ?  Alcohol/week: 1.0 - 2.0 standard drink  ?  Types: 1 - 2 Standard drinks or equivalent per week  ?  Comment: occassionally  ? Drug use: Never  ? Sexual activity: Not on file  ? ? ?Allergies  ?Allergen Reactions  ? Niacin Hives  ?  ? ?Outpatient Medications Prior to Visit  ?Medication Sig Dispense Refill  ? albuterol (PROVENTIL HFA;VENTOLIN HFA) 108 (90 Base) MCG/ACT inhaler Inhale 2 puffs into the lungs every 6 (  six) hours as needed for wheezing or shortness of breath.    ? atorvastatin (LIPITOR) 40 MG tablet TAKE ONE TABLET BY MOUTH ONCE DAILY 90 tablet 2  ? buPROPion (WELLBUTRIN SR) 150 MG 12 hr tablet Take 300 mg by mouth daily.    ? diltiazem (CARDIZEM CD) 120 MG 24 hr capsule Take 120 mg by mouth daily.    ? dofetilide (TIKOSYN) 250 MCG capsule TAKE ONE CAPSULE BY MOUTH TWICE A DAY 180 capsule 3  ? ELIQUIS 5 MG TABS tablet TAKE ONE TABLET BY MOUTH TWICE DAILY 180 tablet 1  ? lamoTRIgine (LAMICTAL) 100 MG tablet Take 1 tablet (100 mg total) by mouth daily. 90 tablet 0  ? Levocetirizine Dihydrochloride (XYZAL PO) Take 1 tablet by mouth daily as needed.    ? lisinopril (ZESTRIL) 10 MG tablet Take 10 mg by mouth daily.    ? omeprazole (PRILOSEC) 20 MG capsule Take 20 mg by mouth daily before breakfast.     ? valACYclovir (VALTREX) 1000 MG tablet Take 1 tablet (1,000 mg total) by mouth 3 (three) times  daily. 21 tablet 0  ? zolpidem (AMBIEN) 10 MG tablet 1/2-1 tablet at bedtime as needed    ? furosemide (LASIX) 40 MG tablet Take 1 tablet (40 mg total) by mouth 2 (two) times daily. Take afternoon dose only as needed. 180 tablet 3  ? Naphazoline-Pheniramine (OPCON-A) 0.027-0.315 % SOLN Place 1-2 drops into both eyes 3 (three) times daily as needed (for allergies).  (Patient not taking: Reported on 09/09/2021)    ? oxyCODONE-acetaminophen (PERCOCET/ROXICET) 5-325 MG tablet Take 1 tablet by mouth as needed for severe pain.    ? ?No facility-administered medications prior to visit.  ? ? ?Review of Systems  ?Constitutional:  Negative for chills, diaphoresis, fever, malaise/fatigue and weight loss.  ?HENT:  Negative for congestion.   ?Respiratory:  Positive for shortness of breath. Negative for cough, hemoptysis, sputum production and wheezing.   ?Cardiovascular:  Negative for chest pain, palpitations and leg swelling.  ? ? ?Objective:  ? ?Vitals:  ? 09/09/21 1411  ?BP: 118/80  ?Pulse: 99  ?SpO2: 94%  ?Weight: 268 lb 12.8 oz (121.9 kg)  ?Height: '5\' 5"'$  (1.651 m)  ?SpO2: 94 % ?O2 Device: None (Room air) ? ?Physical Exam: ?General: Well-appearing, no acute distress ?HENT: [place], AT ?Eyes: EOMI, no scleral icterus ?Respiratory: Clear to auscultation bilaterally.  No crackles, wheezing or rales ?Cardiovascular: RRR, -M/R/G, no JVD ?Extremities:-Edema,-tenderness ?Neuro: AAO x4, CNII-XII grossly intact ?Psych: Normal mood, normal affect ? ?Data Reviewed: ? ?Imaging: ?CT Cardiac 07/01/19 - Visualized lung parenchyma is normal with no interstitial changes, ground glass or nodules/masses  ?CXR 08/23/21 - Normal ? ?PFT: ?None on file ? ?Labs: ?CBC ?   ?Component Value Date/Time  ? WBC 14.9 (H) 08/23/2021 1901  ? RBC 4.83 08/23/2021 1901  ? HGB 13.4 08/23/2021 1901  ? HGB 13.7 04/19/2021 1052  ? HGB 14.0 02/15/2007 1501  ? HCT 42.3 08/23/2021 1901  ? HCT 41.0 04/19/2021 1052  ? HCT 40.0 02/15/2007 1501  ? PLT 342 08/23/2021 1901  ? PLT  352 04/19/2021 1052  ? MCV 87.6 08/23/2021 1901  ? MCV 85 04/19/2021 1052  ? MCV 87.2 02/15/2007 1501  ? MCH 27.7 08/23/2021 1901  ? MCHC 31.7 08/23/2021 1901  ? RDW 13.8 08/23/2021 1901  ? RDW 12.3 04/19/2021 1052  ? RDW 12.9 02/15/2007 1501  ? LYMPHSABS 2.6 01/21/2020 1648  ? LYMPHSABS 2.8 02/15/2007 1501  ? MONOABS 1.3 (H) 03/26/2014  2005  ? MONOABS 0.9 02/15/2007 1501  ? EOSABS 0.3 01/21/2020 1648  ? BASOSABS 0.1 01/21/2020 1648  ? BASOSABS 0.0 02/15/2007 1501  ? ?Absolute eos 01/21/20 - 300 ? ?   ?Assessment & Plan:  ? ?Discussion: ?66 year old female former smoker with childhood asthma, OSA, atrial flutter/fibrillation, sinus pauses s/p PM 2021, HLD and GERD who presents for evaluation for asthma. Has dyspnea likely related to untreated asthma, deconditioning and weight gain. Discussed clinical course and management of asthma including bronchodilator regimen and action plan for exacerbation. Will confirm asthma before starting maintenance inhalers ? ?Shortness of breath ?--ORDER pulmonary function tests ?--CONTINUE Albuterol AS NEEDED for shortness of breath or wheezing ? ?Health Maintenance ?Immunization History  ?Administered Date(s) Administered  ? Influenza-Unspecified 01/14/2020  ? PFIZER(Purple Top)SARS-COV-2 Vaccination 10/05/2019, 10/28/2019  ? Td 06/14/2003  ? Tdap 12/06/2011  ? Zoster, Live 02/15/2018, 07/22/2020  ? ?CT Lung Screen - not qualified. >15 years since patient smoked ? ?Orders Placed This Encounter  ?Procedures  ? Pulmonary function test  ?  Standing Status:   Future  ?  Standing Expiration Date:   09/10/2022  ?  Order Specific Question:   Where should this test be performed?  ?  Answer:   [facility]  ?  Order Specific Question:   Full PFT: includes the following: basic spirometry, spirometry pre & post bronchodilator, diffusion capacity (DLCO), lung volumes  ?  Answer:   Full PFT  ?No orders of the defined types were placed in this encounter. ? ? ?No follow-ups on file. After PFTs  in May ? ?I have spent a total time of 45-minutes on the day of the appointment reviewing prior documentation, coordinating care and discussing medical diagnosis and plan with the patient/family. Imagin

## 2021-09-27 DIAGNOSIS — G4700 Insomnia, unspecified: Secondary | ICD-10-CM | POA: Diagnosis not present

## 2021-09-27 DIAGNOSIS — E782 Mixed hyperlipidemia: Secondary | ICD-10-CM | POA: Diagnosis not present

## 2021-09-27 DIAGNOSIS — J4520 Mild intermittent asthma, uncomplicated: Secondary | ICD-10-CM | POA: Diagnosis not present

## 2021-09-27 DIAGNOSIS — K219 Gastro-esophageal reflux disease without esophagitis: Secondary | ICD-10-CM | POA: Diagnosis not present

## 2021-09-27 DIAGNOSIS — I4891 Unspecified atrial fibrillation: Secondary | ICD-10-CM | POA: Diagnosis not present

## 2021-09-27 DIAGNOSIS — I10 Essential (primary) hypertension: Secondary | ICD-10-CM | POA: Diagnosis not present

## 2021-10-01 ENCOUNTER — Encounter: Payer: Self-pay | Admitting: Cardiology

## 2021-10-01 ENCOUNTER — Ambulatory Visit (INDEPENDENT_AMBULATORY_CARE_PROVIDER_SITE_OTHER): Payer: Medicare Other | Admitting: Cardiology

## 2021-10-01 VITALS — BP 126/66 | HR 78 | Ht 66.0 in | Wt 270.0 lb

## 2021-10-01 DIAGNOSIS — I4819 Other persistent atrial fibrillation: Secondary | ICD-10-CM | POA: Diagnosis not present

## 2021-10-01 DIAGNOSIS — Z950 Presence of cardiac pacemaker: Secondary | ICD-10-CM | POA: Diagnosis not present

## 2021-10-01 DIAGNOSIS — Z79899 Other long term (current) drug therapy: Secondary | ICD-10-CM

## 2021-10-01 DIAGNOSIS — I495 Sick sinus syndrome: Secondary | ICD-10-CM

## 2021-10-01 LAB — PACEMAKER DEVICE OBSERVATION

## 2021-10-01 MED ORDER — DOFETILIDE 250 MCG PO CAPS: 250.0000 ug | ORAL_CAPSULE | Freq: Two times a day (BID) | ORAL | 3 refills | Status: DC

## 2021-10-01 NOTE — Progress Notes (Signed)
?Electrophysiology Office Follow up Visit Note:   ? ?Date:  10/02/2021  ? ?ID:  Mary Mcfarland, DOB [birth year], MRN [id number] ? ?PCP:  Lawerance Cruel, MD  ?[facility] Cardiologist:  Buford Dresser, MD  ?[facility] Electrophysiologist:  Vickie Epley, MD  ? ? ?Interval History:   ? ?Mary Mcfarland is a 66 y.o. female who presents for a follow up visit.  The patient was followed previously by Dr. Rayann Heman.  She is also followed by Dr. Harrell Gave.  She last saw Dr. Harrell Gave on July 15, 2021.  She has a history of atrial fibrillation, obesity, hypertension, sleep apnea, and hyperlipidemia.  The patient is maintained on Tikosyn for her atrial fibrillation.  She is on Eliquis for stroke prophylaxis. ? ? ?  ? ?Past Medical History:  ?Diagnosis Date  ? Asthma   ? Atrial fibrillation ([place])   ? Back pain   ? Bipolar disorder ([place])   ? Constipation   ? Depression   ? Difficult intubation   ? Narrow airway  ? Heartburn   ? High cholesterol   ? Hypertension   ? Longstanding persistent atrial fibrillation ([place])   ? Palpitations   ? Pinched vertebral nerve   ? sciatica  ? Prediabetes   ? Sleep apnea   ? sleep study was "inconclusive" per patient  ? SOB (shortness of breath)   ? Swelling of both lower extremities   ? Visit for monitoring Tikosyn therapy 08/27/2019  ? Vitamin D deficiency   ? ? ?Past Surgical History:  ?Procedure Laterality Date  ? bone spur surgery    ? CARDIOVERSION N/A 07/24/2019  ? Procedure: CARDIOVERSION;  Surgeon: Buford Dresser, MD;  Location: [facility] ENDOSCOPY;  Service: Cardiovascular;  Laterality: N/A;  ? COLONOSCOPY    ? FOOT SURGERY  2017  ? HEEL SPUR RESECTION Left 03/15/2016  ? Procedure: ACHILLIES TENDON REPAIR AND PUMP BUMP EXCISION;  Surgeon: Melrose Nakayama, MD;  Location: [facility];  Service: Orthopedics;  Laterality: Left;  ? PACEMAKER IMPLANT N/A 02/06/2020  ? Procedure: PACEMAKER IMPLANT;  Surgeon: Thompson Grayer, MD;  Location: [facility] CV LAB;  Service:  Cardiovascular;  Laterality: N/A;  ? SHOULDER SURGERY  2010  ? TOE SURGERY  2011  ? TONSILLECTOMY    ? ? ?Current Medications: ?Current Meds  ?Medication Sig  ? albuterol (PROVENTIL HFA;VENTOLIN HFA) 108 (90 Base) MCG/ACT inhaler Inhale 2 puffs into the lungs every 6 (six) hours as needed for wheezing or shortness of breath.  ? atorvastatin (LIPITOR) 40 MG tablet TAKE ONE TABLET BY MOUTH ONCE DAILY  ? buPROPion (WELLBUTRIN SR) 150 MG 12 hr tablet Take 300 mg by mouth daily.  ? diltiazem (CARDIZEM CD) 120 MG 24 hr capsule Take 120 mg by mouth daily.  ? ELIQUIS 5 MG TABS tablet TAKE ONE TABLET BY MOUTH TWICE DAILY  ? furosemide (LASIX) 40 MG tablet Take 1 tablet (40 mg total) by mouth 2 (two) times daily. Take afternoon dose only as needed.  ? lamoTRIgine (LAMICTAL) 100 MG tablet Take 1 tablet (100 mg total) by mouth daily.  ? Levocetirizine Dihydrochloride (XYZAL PO) Take 1 tablet by mouth daily as needed.  ? lisinopril (ZESTRIL) 10 MG tablet Take 10 mg by mouth daily.  ? Naphazoline-Pheniramine (OPCON-A) 0.027-0.315 % SOLN Place 1-2 drops into both eyes 3 (three) times daily as needed (for allergies).  ? omeprazole (PRILOSEC) 20 MG capsule Take 20 mg by mouth daily before breakfast.   ? valACYclovir (VALTREX) 1000 MG tablet  Take 1 tablet (1,000 mg total) by mouth 3 (three) times daily. (Patient taking differently: Take 1,000 mg by mouth as needed.)  ? zolpidem (AMBIEN) 10 MG tablet 1/2-1 tablet at bedtime as needed  ? [DISCONTINUED] dofetilide (TIKOSYN) 250 MCG capsule TAKE ONE CAPSULE BY MOUTH TWICE A DAY  ?  ? ?Allergies:   Niacin  ? ?Social History  ? ?Socioeconomic History  ? Marital status: Divorced  ?  Spouse name: Not on file  ? Number of children: Not on file  ? Years of education: Not on file  ? Highest education level: Not on file  ?Occupational History  ? Occupation: retired  ?Tobacco Use  ? Smoking status: Former  ?  Packs/day: 1.00  ?  Types: Cigarettes  ?  Quit date: 02/13/1996  ?  Years since quitting:  25.6  ? Smokeless tobacco: Never  ? Tobacco comments:  ?  Former smoker 02/02/21  ?Vaping Use  ? Vaping Use: Never used  ?Substance and Sexual Activity  ? Alcohol use: Yes  ?  Alcohol/week: 1.0 - 2.0 standard drink  ?  Types: 1 - 2 Standard drinks or equivalent per week  ?  Comment: occassionally  ? Drug use: Never  ? Sexual activity: Not on file  ?Other Topics Concern  ? Not on file  ?Social History Narrative  ? Lives in [place] alone  ? Work at [employer])  ? ?Social Determinants of Health  ? ?Financial Resource Strain: Not on file  ?Food Insecurity: Not on file  ?Transportation Needs: Not on file  ?Physical Activity: Not on file  ?Stress: Not on file  ?Social Connections: Not on file  ?  ? ?Family History: ?The patient's family history includes Alcoholism in her father; Bipolar disorder in her mother; CVA in her mother; Cancer in her mother; Depression in her mother; Diabetes in her father; Drug abuse in her father; Heart attack in her father; Heart disease in her father and mother; Hypertension in her father and mother; Liver disease in her father; Obesity in her father and mother; Stroke in her mother. ? ?ROS:   ?Please see the history of present illness.    ?All other systems reviewed and are negative. ? ?EKGs/Labs/Other Studies Reviewed:   ? ?The following studies were reviewed today: ? ?October 01, 2021 in clinic device interrogation personally reviewed ?Stable lead parameters.  50% pacing with DDD 70-110.   ?Less than 1% A-fib burden ?Battery longevity 7 years ?Changed atrial pulse amplitude today ? ?EKG:  The ekg ordered today demonstrates sinus rhythm, right bundle branch block, first-degree AV delay, intermittent atrial pacing.  QTc 470 ms ? ?Recent Labs: ?04/19/2021: Magnesium 2.1 ?08/23/2021: BUN 26; Creatinine, Ser 0.90; Hemoglobin 13.4; Platelets 342; Potassium 4.1; Sodium 138  ?Recent Lipid Panel ?   ?Component Value Date/Time  ? CHOL 168 01/14/2020 1212  ? TRIG 78 01/14/2020 1212  ?  HDL 61 01/14/2020 1212  ? CHOLHDL 3.1 11/13/2019 0843  ? [place] 92 01/14/2020 1212  ? ? ?Physical Exam:   ? ?VS:  BP 126/66   Pulse 78   Ht '5\' 6"'$  (1.676 m)   Wt 270 lb (122.5 kg)   LMP 09/24/2013   SpO2 94%   BMI 43.58 kg/m?    ? ?Wt Readings from Last 3 Encounters:  ?10/01/21 270 lb (122.5 kg)  ?09/09/21 268 lb 12.8 oz (121.9 kg)  ?08/30/21 265 lb 12.8 oz (120.6 kg)  ?  ? ?GEN:  Well nourished, well developed in no  acute distress ?HEENT: Normal ?NECK: No JVD; No carotid bruits ?LYMPHATICS: No lymphadenopathy ?CARDIAC: RRR, no murmurs, rubs, gallops.  Pacemaker pocket well-healed ?RESPIRATORY:  Clear to auscultation without rales, wheezing or rhonchi  ?ABDOMEN: Soft, non-tender, non-distended ?MUSCULOSKELETAL:  No edema; No deformity  ?SKIN: Warm and dry ?NEUROLOGIC:  Alert and oriented x 3 ?PSYCHIATRIC:  Normal affect  ? ? ? ?  ? ?ASSESSMENT:   ? ?1. Persistent atrial fibrillation ([place])   ?2. Morbid obesity ([place])   ?3. Sick sinus syndrome ([place])   ?4. Cardiac pacemaker in situ   ?5. Encounter for long-term current use of high risk medication   ? ?PLAN:   ? ?In order of problems listed above: ? ?#Persistent atrial fibrillation ?Very low burden of A-fib on device interrogations.  Continue Tikosyn at current dose.  Continue Eliquis.  QTc acceptable for continued use.  Blood work last month with stable creatinine. ? ?#Sick sinus syndrome ?#Pacemaker in situ ?Device functioning appropriately.  Continue remote monitoring. ? ?#Tikosyn monitoring ?QTc and creatinine stable for continued use. ? ?Follow-up 4-6 months with A-fib clinic for Tikosyn monitoring. ?Follow-up with our clinic in 1 year for pacemaker monitoring.  APP appointment okay. ? ? ? ? ?Medication Adjustments/Labs and Tests Ordered: ?Current medicines are reviewed at length with the patient today.  Concerns regarding medicines are outlined above.  ?Orders Placed This Encounter  ?Procedures  ? EKG 12-Lead  ? ?Meds ordered this encounter  ?Medications  ?  dofetilide (TIKOSYN) 250 MCG capsule  ?  Sig: Take 1 capsule (250 mcg total) by mouth 2 (two) times daily.  ?  Dispense:  180 capsule  ?  Refill:  3  ? ? ? ?Signed, ?Lars Mage, MD, [facility], [facility] ?10/03/18

## 2021-10-01 NOTE — Patient Instructions (Signed)
Medication Instructions:  ?Your physician recommends that you continue on your current medications as directed. Please refer to the Current Medication list given to you today. ?*If you need a refill on your cardiac medications before your next appointment, please call your pharmacy* ? ?Lab Work: ?None. ?If you have labs (blood work) drawn today and your tests are completely normal, you will receive your results only by: ?MyChart Message (if you have MyChart) OR ?A paper copy in the mail ?If you have any lab test that is abnormal or we need to change your treatment, we will call you to review the results. ? ?Testing/Procedures: ?None. ? ?Follow-Up: ?At [facility], you and your health needs are our priority.  As part of our continuing mission to provide you with exceptional heart care, we have created designated Provider Care Teams.  These Care Teams include your primary Cardiologist (physician) and Advanced Practice Providers (APPs -  Physician Assistants and Nurse Practitioners) who all work together to provide you with the care you need, when you need it. ? ?Your physician wants you to follow-up in: 6 months with the Afib Clinic. They will contact you to schedule. Then 1 year with ? ?Tommye Standard, PA-C ?Legrand Como "Jonni Sanger" [place], PA-C ? ? ? ?We recommend signing up for the patient portal called "MyChart".  Sign up information is provided on this After Visit Summary.  MyChart is used to connect with patients for Virtual Visits (Telemedicine).  Patients are able to view lab/test results, encounter notes, upcoming appointments, etc.  Non-urgent messages can be sent to your provider as well.   ?To learn more about what you can do with MyChart, go to [email or web address].   ? ?Any Other Special Instructions Will Be Listed Below (If Applicable). ? ? ? ? ?  ? ? ?

## 2021-10-13 ENCOUNTER — Ambulatory Visit (INDEPENDENT_AMBULATORY_CARE_PROVIDER_SITE_OTHER): Payer: Medicare Other | Admitting: Pulmonary Disease

## 2021-10-13 DIAGNOSIS — R0602 Shortness of breath: Secondary | ICD-10-CM

## 2021-10-13 NOTE — Patient Instructions (Signed)
Attempted full PFT today. Performed Pre/Post Spirometry and DLCO. ?

## 2021-10-13 NOTE — Progress Notes (Signed)
Attempted full PFT today. Performed Pre/Post Spirometry and DLCO. ?

## 2021-10-14 ENCOUNTER — Ambulatory Visit (INDEPENDENT_AMBULATORY_CARE_PROVIDER_SITE_OTHER): Payer: Medicare Other | Admitting: Pulmonary Disease

## 2021-10-14 ENCOUNTER — Encounter: Payer: Self-pay | Admitting: Pulmonary Disease

## 2021-10-14 VITALS — BP 128/70 | HR 95 | Temp 98.2°F | Ht 66.0 in | Wt 276.6 lb

## 2021-10-14 DIAGNOSIS — J4530 Mild persistent asthma, uncomplicated: Secondary | ICD-10-CM

## 2021-10-14 LAB — PULMONARY FUNCTION TEST
DL/VA % pred: 122 %
DL/VA: 5.11 ml/min/mmHg/L
DLCO cor % pred: 124 %
DLCO cor: 25.79 ml/min/mmHg
DLCO unc % pred: 130 %
DLCO unc: 26.9 ml/min/mmHg
FEF 25-75 Post: 2.17 L/sec
FEF 25-75 Pre: 1.47 L/sec
FEF2575-%Change-Post: 47 %
FEF2575-%Pred-Post: 95 %
FEF2575-%Pred-Pre: 64 %
FEV1-%Change-Post: 10 %
FEV1-%Pred-Post: 76 %
FEV1-%Pred-Pre: 68 %
FEV1-Post: 1.96 L
FEV1-Pre: 1.77 L
FEV1FVC-%Change-Post: 0 %
FEV1FVC-%Pred-Pre: 99 %
FEV6-%Change-Post: 9 %
FEV6-%Pred-Post: 77 %
FEV6-%Pred-Pre: 71 %
FEV6-Post: 2.51 L
FEV6-Pre: 2.29 L
FEV6FVC-%Pred-Post: 103 %
FEV6FVC-%Pred-Pre: 103 %
FVC-%Change-Post: 9 %
FVC-%Pred-Post: 75 %
FVC-%Pred-Pre: 68 %
FVC-Post: 2.51 L
FVC-Pre: 2.29 L
Post FEV1/FVC ratio: 78 %
Post FEV6/FVC ratio: 100 %
Pre FEV1/FVC ratio: 77 %
Pre FEV6/FVC Ratio: 100 %

## 2021-10-14 MED ORDER — FLUTICASONE FUROATE-VILANTEROL 100-25 MCG/ACT IN AEPB: 1.0000 | INHALATION_SPRAY | Freq: Every day | RESPIRATORY_TRACT | 5 refills | Status: DC

## 2021-10-14 NOTE — Patient Instructions (Signed)
Mild persistent asthma ?--START Breo 100-25 mcg ONE puff ONCE a day ?--CONTINUE Albuterol AS NEEDED for shortness of breath or wheezing ? ?Follow-up with me in 3 months ?

## 2021-10-14 NOTE — Progress Notes (Signed)
? ? ?Subjective:  ? ?PATIENT ID: Mary Mcfarland GENDER: female DOB: [birth year], MRN: [id number] ? ? ?HPI ? ?Chief Complaint  ?Patient presents with  ? Follow-up  ?  PFT yesterday. No complaints of coughing, wheezing, SOB.  ? ? ?Reason for Visit: Follow-up ? ?Ms. [name] is a 66 year old female former smoker with childhood asthma, OSA, atrial flutter/fibrillation, sinus pauses s/p PM 2021 , HLD and GERD who presents for follow-up for asthma. ? ?Synopsis: ?Referred by Dr. Harrington Challenger at [facility] at [place] reviewed. Note 07/27/21 reports persistent dyspnea. Prescribed albuterol ?She reports longstanding shortness of breath that has persisted even after her PM.  Five years ago she reports normal activity and being able to walk upstairs. Very rarely used her albuterol inhaler. Is not on any maintenance inhalers. Denies coughing and wheezing. But since her PM her activity is more limited. Worsened around animal dander, pollen. She is currently not active due to knee pain.  ? ?10/14/21 ?Since our last visit she has persistent shortness of breath with exertion. Seems to have persistent after her atrial fibrillation started. Denies cough and wheezing. Rarely uses albuterol.  ? ?   ? View : No data to display.  ?  ?  ?  ? ?Social History: ?Former smoker. Smoked 1 ppd. Started at 5 and stopped at 40 years. Quit in 25 years ago ? ?Past Medical History:  ?Diagnosis Date  ? Asthma   ? Atrial fibrillation ([place])   ? Back pain   ? Bipolar disorder ([place])   ? Constipation   ? Depression   ? Difficult intubation   ? Narrow airway  ? Heartburn   ? High cholesterol   ? Hypertension   ? Longstanding persistent atrial fibrillation ([place])   ? Palpitations   ? Pinched vertebral nerve   ? sciatica  ? Prediabetes   ? Sleep apnea   ? sleep study was "inconclusive" per patient  ? SOB (shortness of breath)   ? Swelling of both lower extremities   ? Visit for monitoring Tikosyn therapy 08/27/2019  ? Vitamin D deficiency   ?  ? ?Family  History  ?Problem Relation Age of Onset  ? Heart disease Mother   ? CVA Mother   ? Hypertension Mother   ? Cancer Mother   ? Stroke Mother   ? Depression Mother   ? Bipolar disorder Mother   ? Obesity Mother   ? Heart disease Father   ? Hypertension Father   ? Heart attack Father   ? Diabetes Father   ? Liver disease Father   ? Alcoholism Father   ? Drug abuse Father   ? Obesity Father   ?  ? ?Social History  ? ?Occupational History  ? Occupation: retired  ?Tobacco Use  ? Smoking status: Former  ?  Packs/day: 1.00  ?  Types: Cigarettes  ?  Quit date: 02/13/1996  ?  Years since quitting: 25.6  ? Smokeless tobacco: Never  ? Tobacco comments:  ?  Former smoker 02/02/21  ?Vaping Use  ? Vaping Use: Never used  ?Substance and Sexual Activity  ? Alcohol use: Yes  ?  Alcohol/week: 1.0 - 2.0 standard drink  ?  Types: 1 - 2 Standard drinks or equivalent per week  ?  Comment: occassionally  ? Drug use: Never  ? Sexual activity: Not on file  ? ? ?Allergies  ?Allergen Reactions  ? Niacin Hives  ?  ? ?Outpatient Medications Prior to Visit  ?Medication  Sig Dispense Refill  ? albuterol (PROVENTIL HFA;VENTOLIN HFA) 108 (90 Base) MCG/ACT inhaler Inhale 2 puffs into the lungs every 6 (six) hours as needed for wheezing or shortness of breath.    ? atorvastatin (LIPITOR) 40 MG tablet TAKE ONE TABLET BY MOUTH ONCE DAILY 90 tablet 2  ? buPROPion (WELLBUTRIN SR) 150 MG 12 hr tablet Take 300 mg by mouth daily.    ? diltiazem (CARDIZEM CD) 120 MG 24 hr capsule Take 120 mg by mouth daily.    ? dofetilide (TIKOSYN) 250 MCG capsule Take 1 capsule (250 mcg total) by mouth 2 (two) times daily. 180 capsule 3  ? ELIQUIS 5 MG TABS tablet TAKE ONE TABLET BY MOUTH TWICE DAILY 180 tablet 1  ? lamoTRIgine (LAMICTAL) 100 MG tablet Take 1 tablet (100 mg total) by mouth daily. 90 tablet 0  ? Levocetirizine Dihydrochloride (XYZAL PO) Take 1 tablet by mouth daily as needed.    ? lisinopril (ZESTRIL) 10 MG tablet Take 10 mg by mouth daily.    ?  Naphazoline-Pheniramine (OPCON-A) 0.027-0.315 % SOLN Place 1-2 drops into both eyes 3 (three) times daily as needed (for allergies).    ? omeprazole (PRILOSEC) 20 MG capsule Take 20 mg by mouth daily before breakfast.     ? valACYclovir (VALTREX) 1000 MG tablet Take 1 tablet (1,000 mg total) by mouth 3 (three) times daily. (Patient taking differently: Take 1,000 mg by mouth as needed.) 21 tablet 0  ? zolpidem (AMBIEN) 10 MG tablet 1/2-1 tablet at bedtime as needed    ? furosemide (LASIX) 40 MG tablet Take 1 tablet (40 mg total) by mouth 2 (two) times daily. Take afternoon dose only as needed. 180 tablet 3  ? ?No facility-administered medications prior to visit.  ? ? ?Review of Systems  ?Constitutional:  Negative for chills, diaphoresis, fever, malaise/fatigue and weight loss.  ?HENT:  Negative for congestion.   ?Respiratory:  Positive for shortness of breath. Negative for cough, hemoptysis, sputum production and wheezing.   ?Cardiovascular:  Negative for chest pain, palpitations and leg swelling.  ? ? ?Objective:  ? ?Vitals:  ? 10/14/21 0905  ?BP: 128/70  ?Pulse: 95  ?Temp: 98.2 ?F (36.8 ?C)  ?TempSrc: Oral  ?SpO2: 92%  ?Weight: 276 lb 9.6 oz (125.5 kg)  ?Height: '5\' 6"'$  (1.676 m)  ?SpO2: 92 % (RA) ? ?Physical Exam: ?General: Well-appearing, no acute distress ?HENT: [place], AT ?Eyes: EOMI, no scleral icterus ?Respiratory: Clear to auscultation bilaterally.  No crackles, wheezing or rales ?Cardiovascular: RRR, -M/R/G, no JVD ?Extremities:-Edema,-tenderness ?Neuro: AAO x4, CNII-XII grossly intact ?Psych: Normal mood, normal affect ? ?Data Reviewed: ? ?Imaging: ?CT Cardiac 07/01/19 - Visualized lung parenchyma is normal with no interstitial changes, ground glass or nodules/masses  ?CXR 08/23/21 - No acute issues ? ?PFT: ?10/13/21 ?FVC 2.51 (75%) FEV1 1.96 (76%) Ratio 77 DLCO 130% ?Interpretation: No obstructive defect on spirometry. Partial bronchodilator response however not significant. This does not preclude benefit of  bronchodilator response. Elevated DLCO may also suggest. Clinically correlate. ? ?Labs: ?CBC ?   ?Component Value Date/Time  ? WBC 14.9 (H) 08/23/2021 1901  ? RBC 4.83 08/23/2021 1901  ? HGB 13.4 08/23/2021 1901  ? HGB 13.7 04/19/2021 1052  ? HGB 14.0 02/15/2007 1501  ? HCT 42.3 08/23/2021 1901  ? HCT 41.0 04/19/2021 1052  ? HCT 40.0 02/15/2007 1501  ? PLT 342 08/23/2021 1901  ? PLT 352 04/19/2021 1052  ? MCV 87.6 08/23/2021 1901  ? MCV 85 04/19/2021 1052  ?  MCV 87.2 02/15/2007 1501  ? MCH 27.7 08/23/2021 1901  ? MCHC 31.7 08/23/2021 1901  ? RDW 13.8 08/23/2021 1901  ? RDW 12.3 04/19/2021 1052  ? RDW 12.9 02/15/2007 1501  ? LYMPHSABS 2.6 01/21/2020 1648  ? LYMPHSABS 2.8 02/15/2007 1501  ? MONOABS 1.3 (H) 03/26/2014 2005  ? MONOABS 0.9 02/15/2007 1501  ? EOSABS 0.3 01/21/2020 1648  ? BASOSABS 0.1 01/21/2020 1648  ? BASOSABS 0.0 02/15/2007 1501  ? ?Absolute eos 01/21/20 - 300 ? ?   ?Assessment & Plan:  ? ?Discussion: ?66 year old female former smoker with childhood asthma, OSA, atrial flutter/fibrillation, sinus pauses s/p PM 2021, HLD and GERD who presents for follow-up. Continues to have shortness of breath. We reviewed PFTs which demonstrate no obstructive defect however partial response to bronchodilators. Discussed clinical course and management of asthma including bronchodilator regimen and action plan for exacerbation. ? ?Mild persistent asthma ?--START Breo 100-25 mcg ONE puff ONCE a day ?--CONTINUE Albuterol AS NEEDED for shortness of breath or wheezing ? ?Health Maintenance ?Immunization History  ?Administered Date(s) Administered  ? Influenza-Unspecified 01/14/2020  ? PFIZER(Purple Top)SARS-COV-2 Vaccination 10/05/2019, 10/28/2019  ? Td 06/14/2003  ? Tdap 12/06/2011  ? Zoster, Live 02/15/2018, 07/22/2020  ? ?CT Lung Screen - not qualified. >15 years since patient smoked ? ?No orders of the defined types were placed in this encounter. ? ?Meds ordered this encounter  ?Medications  ? fluticasone  furoate-vilanterol (BREO ELLIPTA) 100-25 MCG/ACT AEPB  ?  Sig: Inhale 1 puff into the lungs daily.  ?  Dispense:  60 each  ?  Refill:  5  ? ? ?Return in about 3 months (around 01/14/2022).  ? ?I have spent a total time of 35-minutes on the day

## 2021-10-15 ENCOUNTER — Encounter (HOSPITAL_BASED_OUTPATIENT_CLINIC_OR_DEPARTMENT_OTHER): Payer: Self-pay | Admitting: Emergency Medicine

## 2021-10-15 ENCOUNTER — Emergency Department (HOSPITAL_BASED_OUTPATIENT_CLINIC_OR_DEPARTMENT_OTHER)
Admission: EM | Admit: 2021-10-15 | Discharge: 2021-10-15 | Disposition: A | Discharge status: Home / Self Care | Payer: Medicare Other | Attending: Emergency Medicine | Admitting: Emergency Medicine

## 2021-10-15 ENCOUNTER — Other Ambulatory Visit: Payer: Self-pay

## 2021-10-15 ENCOUNTER — Emergency Department (HOSPITAL_BASED_OUTPATIENT_CLINIC_OR_DEPARTMENT_OTHER): Payer: Medicare Other

## 2021-10-15 DIAGNOSIS — K5732 Diverticulitis of large intestine without perforation or abscess without bleeding: Secondary | ICD-10-CM | POA: Insufficient documentation

## 2021-10-15 DIAGNOSIS — T7840XA Allergy, unspecified, initial encounter: Secondary | ICD-10-CM | POA: Diagnosis not present

## 2021-10-15 DIAGNOSIS — R7989 Other specified abnormal findings of blood chemistry: Secondary | ICD-10-CM | POA: Diagnosis not present

## 2021-10-15 DIAGNOSIS — R21 Rash and other nonspecific skin eruption: Secondary | ICD-10-CM | POA: Diagnosis present

## 2021-10-15 DIAGNOSIS — Z79899 Other long term (current) drug therapy: Secondary | ICD-10-CM | POA: Insufficient documentation

## 2021-10-15 DIAGNOSIS — K5730 Diverticulosis of large intestine without perforation or abscess without bleeding: Secondary | ICD-10-CM | POA: Diagnosis not present

## 2021-10-15 DIAGNOSIS — Z950 Presence of cardiac pacemaker: Secondary | ICD-10-CM | POA: Insufficient documentation

## 2021-10-15 DIAGNOSIS — D72829 Elevated white blood cell count, unspecified: Secondary | ICD-10-CM | POA: Insufficient documentation

## 2021-10-15 DIAGNOSIS — Z7901 Long term (current) use of anticoagulants: Secondary | ICD-10-CM | POA: Insufficient documentation

## 2021-10-15 DIAGNOSIS — K5792 Diverticulitis of intestine, part unspecified, without perforation or abscess without bleeding: Secondary | ICD-10-CM

## 2021-10-15 DIAGNOSIS — N281 Cyst of kidney, acquired: Secondary | ICD-10-CM | POA: Diagnosis not present

## 2021-10-15 DIAGNOSIS — I10 Essential (primary) hypertension: Secondary | ICD-10-CM | POA: Diagnosis not present

## 2021-10-15 LAB — URINALYSIS, ROUTINE W REFLEX MICROSCOPIC
Glucose, UA: NEGATIVE mg/dL
Ketones, ur: NEGATIVE mg/dL
Nitrite: NEGATIVE
Protein, ur: NEGATIVE mg/dL
Specific Gravity, Urine: >1.03 — ABNORMAL HIGH (ref 1.005–1.030)
pH: 6 (ref 5.0–8.0)

## 2021-10-15 LAB — LIPASE, BLOOD: Lipase: 41 U/L (ref 11–51)

## 2021-10-15 LAB — CBC
HCT: 41 % (ref 36.0–46.0)
Hemoglobin: 13.1 g/dL (ref 12.0–15.0)
MCH: 27.8 pg (ref 26.0–34.0)
MCHC: 32 g/dL (ref 30.0–36.0)
MCV: 87 fL (ref 80.0–100.0)
Platelets: 324 10*3/uL (ref 150–400)
RBC: 4.71 MIL/uL (ref 3.87–5.11)
RDW: 14 % (ref 11.5–15.5)
WBC: 17.8 10*3/uL — ABNORMAL HIGH (ref 4.0–10.5)
nRBC: 0 % (ref 0.0–0.2)

## 2021-10-15 LAB — COMPREHENSIVE METABOLIC PANEL
ALT: 18 U/L (ref 0–44)
AST: 18 U/L (ref 15–41)
Albumin: 4.4 g/dL (ref 3.5–5.0)
Alkaline Phosphatase: 79 U/L (ref 38–126)
Anion gap: 14 (ref 5–15)
BUN: 26 mg/dL — ABNORMAL HIGH (ref 8–23)
CO2: 23 mmol/L (ref 22–32)
Calcium: 9.6 mg/dL (ref 8.9–10.3)
Chloride: 103 mmol/L (ref 98–111)
Creatinine, Ser: 1.15 mg/dL — ABNORMAL HIGH (ref 0.44–1.00)
GFR, Estimated: 54 mL/min — ABNORMAL LOW (ref >60)
Glucose, Bld: 148 mg/dL — ABNORMAL HIGH (ref 70–99)
Potassium: 3.7 mmol/L (ref 3.5–5.1)
Sodium: 140 mmol/L (ref 135–145)
Total Bilirubin: 0.6 mg/dL (ref 0.3–1.2)
Total Protein: 7.3 g/dL (ref 6.5–8.1)

## 2021-10-15 LAB — URINALYSIS, MICROSCOPIC (REFLEX)

## 2021-10-15 MED ORDER — DIPHENHYDRAMINE HCL 25 MG PO TABS: 25.0000 mg | ORAL_TABLET | Freq: Four times a day (QID) | ORAL | 0 refills | Status: DC | PRN

## 2021-10-15 MED ORDER — IOHEXOL 300 MG/ML  SOLN
100.0000 mL | Freq: Once | INTRAMUSCULAR | Status: AC | PRN
  Administered 2021-10-15: 100 mL via INTRAVENOUS

## 2021-10-15 MED ORDER — METRONIDAZOLE 500 MG PO TABS: 500.0000 mg | ORAL_TABLET | Freq: Two times a day (BID) | ORAL | 0 refills | Status: AC

## 2021-10-15 MED ORDER — SODIUM CHLORIDE 0.9 % IV BOLUS (SEPSIS)
1000.0000 mL | Freq: Once | INTRAVENOUS | Status: AC
  Administered 2021-10-15: 1000 mL via INTRAVENOUS

## 2021-10-15 MED ORDER — METRONIDAZOLE 500 MG PO TABS: 500.0000 mg | ORAL_TABLET | Freq: Two times a day (BID) | ORAL | 0 refills | Status: DC

## 2021-10-15 MED ORDER — CIPROFLOXACIN HCL 500 MG PO TABS: 500.0000 mg | ORAL_TABLET | Freq: Two times a day (BID) | ORAL | 0 refills | Status: DC

## 2021-10-15 MED ORDER — CIPROFLOXACIN HCL 500 MG PO TABS
500.0000 mg | ORAL_TABLET | Freq: Once | ORAL | Status: AC
  Administered 2021-10-15: 500 mg via ORAL
  Filled 2021-10-15: qty 1

## 2021-10-15 MED ORDER — ONDANSETRON HCL 4 MG/2ML IJ SOLN
4.0000 mg | Freq: Once | INTRAMUSCULAR | Status: AC
Start: 2021-10-15 — End: 2021-10-15
  Administered 2021-10-15: 4 mg via INTRAVENOUS
  Filled 2021-10-15: qty 2

## 2021-10-15 MED ORDER — SODIUM CHLORIDE 0.9 % IV SOLN
1000.0000 mL | INTRAVENOUS | Status: DC
  Administered 2021-10-15: 1000 mL via INTRAVENOUS

## 2021-10-15 MED ORDER — METRONIDAZOLE 500 MG PO TABS
500.0000 mg | ORAL_TABLET | Freq: Once | ORAL | Status: AC
  Administered 2021-10-15: 500 mg via ORAL
  Filled 2021-10-15: qty 1

## 2021-10-15 MED ORDER — DIPHENHYDRAMINE HCL 50 MG/ML IJ SOLN
25.0000 mg | Freq: Once | INTRAMUSCULAR | Status: AC
  Administered 2021-10-15: 25 mg via INTRAVENOUS
  Filled 2021-10-15: qty 1

## 2021-10-15 NOTE — Discharge Instructions (Signed)
The CT scan did show possible early diverticulitis.  Take the antibiotics as prescribed.  The rash you experience is not typical for diverticulitis.  It is possible you had some type of allergic reaction.  Discussed with your doctor possible allergy testing, alpha gal syndrome is a consideration.  It is usually triggered by eating some type of meat or dairy product. ?

## 2021-10-15 NOTE — ED Triage Notes (Signed)
Pt arrives to ED with c/o rash and diarrhea. Pt reports that starting within the past hour she had diarrhea x2 then broke out in a full body generalized red rash.  ?

## 2021-10-15 NOTE — ED Notes (Signed)
Pt verbalizes understanding of discharge instructions. Opportunity for questioning and answers were provided. Pt discharged from ED to home with family.    

## 2021-10-15 NOTE — ED Provider Notes (Signed)
?[facility] EMERGENCY DEPT ?Provider Note ? ? ?CSN: [id number] ?Arrival date & time: 10/15/21  1640 ? ?  ? ?History ? ?Chief Complaint  ?Patient presents with  ? Diarrhea  ? Rash  ? ? ?Mary Mcfarland is a 66 y.o. female. ? ? ?Diarrhea ?Associated symptoms: no fever   ?Rash ?Associated symptoms: diarrhea   ?Associated symptoms: no fever   ? ?Patient presents to the ED for evaluation of diarrhea and a rash.  Patient has a history of hypertension, bipolar, atrial fibrillation, pacemaker, high cholesterol, heartburn presents to the ED with complaints of diarrhea some nausea as well as a rash.  Patient states she had been feeling fine initially today.  She has not been on any new medications.  She did not have any new foods.  She ate tacos today and sometime later she had 1 normal bowel movement and then shortly after that had a diarrhea loose stool bowel movement.  Patient then started becoming nauseated.  She broke out in diffuse itching rash.  Patient denies any known history of food allergies.  She is not having any difficulty breathing ? ?Home Medications ?Prior to Admission medications   ?Medication Sig Start Date End Date Taking? Authorizing Provider  ?ciprofloxacin (CIPRO) 500 MG tablet Take 1 tablet (500 mg total) by mouth 2 (two) times daily. 10/15/21  Yes Dorie Rank, MD  ?diphenhydrAMINE (BENADRYL) 25 MG tablet Take 1 tablet (25 mg total) by mouth every 6 (six) hours as needed. 10/15/21  Yes Dorie Rank, MD  ?metroNIDAZOLE (FLAGYL) 500 MG tablet Take 1 tablet (500 mg total) by mouth 2 (two) times daily for 10 days. 10/15/21 10/25/21 Yes Dorie Rank, MD  ?albuterol (PROVENTIL HFA;VENTOLIN HFA) 108 (90 Base) MCG/ACT inhaler Inhale 2 puffs into the lungs every 6 (six) hours as needed for wheezing or shortness of breath.    [provider]  ?atorvastatin (LIPITOR) 40 MG tablet TAKE ONE TABLET BY MOUTH ONCE DAILY 03/01/21   Buford Dresser, MD  ?buPROPion [facility] SR) 150 MG 12 hr tablet  Take 300 mg by mouth daily.    [provider]  ?diltiazem (CARDIZEM CD) 120 MG 24 hr capsule Take 120 mg by mouth daily.    [provider]  ?dofetilide (TIKOSYN) 250 MCG capsule Take 1 capsule (250 mcg total) by mouth 2 (two) times daily. 10/01/21   Vickie Epley, MD  ?Arne [place] 5 MG TABS tablet TAKE ONE TABLET BY MOUTH TWICE DAILY 05/24/21   Allred, Jeneen Rinks, MD  ?fluticasone furoate-vilanterol (BREO ELLIPTA) 100-25 MCG/ACT AEPB Inhale 1 puff into the lungs daily. 10/14/21   Margaretha Seeds, MD  ?furosemide (LASIX) 40 MG tablet Take 1 tablet (40 mg total) by mouth 2 (two) times daily. Take afternoon dose only as needed. 11/30/20 10/01/21  Buford Dresser, MD  ?lamoTRIgine (LAMICTAL) 100 MG tablet Take 1 tablet (100 mg total) by mouth daily. 08/17/21   Merian Capron, MD  ?Levocetirizine Dihydrochloride (XYZAL PO) Take 1 tablet by mouth daily as needed.    [provider]  ?lisinopril (ZESTRIL) 10 MG tablet Take 10 mg by mouth daily. 01/21/21   [provider]  ?Naphazoline-Pheniramine (OPCON-A) 0.027-0.315 % SOLN Place 1-2 drops into both eyes 3 (three) times daily as needed (for allergies).    [provider]  ?omeprazole (PRILOSEC) 20 MG capsule Take 20 mg by mouth daily before breakfast.     [provider]  ?valACYclovir (VALTREX) 1000 MG tablet Take 1 tablet (1,000 mg total) by mouth 3 (  three) times daily. ?Patient taking differently: Take 1,000 mg by mouth as needed. 07/28/20   Brunetta Jeans, PA-C  ?zolpidem (AMBIEN) 10 MG tablet 1/2-1 tablet at bedtime as needed 08/04/20   [provider]  ?   ? ?Allergies    ?Niacin   ? ?Review of Systems   ?Review of Systems  ?Constitutional:  Negative for fever.  ?Gastrointestinal:  Positive for diarrhea.  ?Skin:  Positive for rash.  ? ?Physical Exam ?Updated Vital Signs ?BP (!) 107/57   Pulse 86   Temp 97.8 ?F (36.6 ?C)   Resp 18   LMP 09/24/2013   SpO2 92%  ?Physical Exam ?Vitals and nursing note  reviewed.  ?Constitutional:   ?   General: She is not in acute distress. ?   Appearance: She is well-developed.  ?HENT:  ?   Head: Normocephalic and atraumatic.  ?   Right Ear: External ear normal.  ?   Left Ear: External ear normal.  ?Eyes:  ?   General: No scleral icterus.    ?   Right eye: No discharge.     ?   Left eye: No discharge.  ?   Conjunctiva/sclera: Conjunctivae normal.  ?Neck:  ?   Trachea: No tracheal deviation.  ?Cardiovascular:  ?   Rate and Rhythm: Normal rate and regular rhythm.  ?Pulmonary:  ?   Effort: Pulmonary effort is normal. No respiratory distress.  ?   Breath sounds: Normal breath sounds. No stridor. No wheezing or rales.  ?Abdominal:  ?   General: Bowel sounds are normal. There is no distension.  ?   Palpations: Abdomen is soft.  ?   Tenderness: There is no abdominal tenderness. There is no guarding or rebound.  ?Musculoskeletal:     ?   General: No tenderness or deformity.  ?   Cervical back: Neck supple.  ?Skin: ?   General: Skin is warm and dry.  ?   Findings: Rash present.  ?   Comments: Macular erythematous rash primarily in the torso and arms,  ?Neurological:  ?   General: No focal deficit present.  ?   Mental Status: She is alert.  ?   Cranial Nerves: No cranial nerve deficit (no facial droop, extraocular movements intact, no slurred speech).  ?   Sensory: No sensory deficit.  ?   Motor: No abnormal muscle tone or seizure activity.  ?   Coordination: Coordination normal.  ?Psychiatric:     ?   Mood and Affect: Mood normal.  ? ? ?ED Results / Procedures / Treatments   ?Labs ?(all labs ordered are listed, but only abnormal results are displayed) ?Labs Reviewed  ?COMPREHENSIVE METABOLIC PANEL - Abnormal; Notable for the following components:  ?    Result Value  ? Glucose, Bld 148 (*)   ? BUN 26 (*)   ? Creatinine, Ser 1.15 (*)   ? GFR, Estimated 54 (*)   ? All other components within normal limits  ?CBC - Abnormal; Notable for the following components:  ? WBC 17.8 (*)   ? All other  components within normal limits  ?URINALYSIS, ROUTINE W REFLEX MICROSCOPIC - Abnormal; Notable for the following components:  ? Color, Urine STRAW (*)   ? Specific Gravity, Urine >1.030 (*)   ? Hgb urine dipstick TRACE (*)   ? Bilirubin Urine SMALL (*)   ? Leukocytes,Ua SMALL (*)   ? All other components within normal limits  ?URINALYSIS, MICROSCOPIC (REFLEX) - Abnormal; Notable for the  following components:  ? Bacteria, UA FEW (*)   ? All other components within normal limits  ?LIPASE, BLOOD  ? ? ?EKG ?None ? ?Radiology ?CT ABDOMEN PELVIS W CONTRAST ? ?Result Date: 10/15/2021 ?CLINICAL DATA:  Left lower quadrant abdominal pain EXAM: CT ABDOMEN AND PELVIS WITH CONTRAST TECHNIQUE: Multidetector CT imaging of the abdomen and pelvis was performed using the standard protocol following bolus administration of intravenous contrast. RADIATION DOSE REDUCTION: This exam was performed according to the departmental dose-optimization program which includes automated exposure control, adjustment of the mA and/or kV according to patient size and/or use of iterative reconstruction technique. CONTRAST:  145m OMNIPAQUE IOHEXOL 300 MG/ML  SOLN COMPARISON:  None Available. FINDINGS: Lower chest: Included lung bases are clear. Heart size within normal limits. Hepatobiliary: No focal liver abnormality is seen. No gallstones, gallbladder wall thickening, or biliary dilatation. Pancreas: Unremarkable. No pancreatic ductal dilatation or surrounding inflammatory changes. Spleen: Normal in size without focal abnormality. Adrenals/Urinary Tract: Unremarkable adrenal glands. 1.6 cm cyst in the upper pole of the left kidney which does not require follow-up. Kidneys enhance symmetrically. No renal stone or hydronephrosis. Urinary bladder within normal limits for the degree of distension. Stomach/Bowel: Stomach is within normal limits. No dilated loops of bowel. Normal appendix in the right lower quadrant. Diverticulosis of the descending and  sigmoid colon. Mild pericolonic fat stranding in the region of the distal sigmoid colon may represent early changes of acute diverticulitis (series 2, image 71). Vascular/Lymphatic: Scattered aortoiliac atheros

## 2021-10-18 DIAGNOSIS — M1712 Unilateral primary osteoarthritis, left knee: Secondary | ICD-10-CM | POA: Diagnosis not present

## 2021-10-22 ENCOUNTER — Encounter: Payer: Self-pay | Admitting: Pulmonary Disease

## 2021-10-25 NOTE — Telephone Encounter (Signed)
Dr. Loanne Drilling will you please advise on pt's PFT results? Thank you  ?

## 2021-10-27 DIAGNOSIS — D72829 Elevated white blood cell count, unspecified: Secondary | ICD-10-CM | POA: Diagnosis not present

## 2021-10-27 DIAGNOSIS — K5792 Diverticulitis of intestine, part unspecified, without perforation or abscess without bleeding: Secondary | ICD-10-CM | POA: Diagnosis not present

## 2021-10-27 DIAGNOSIS — R059 Cough, unspecified: Secondary | ICD-10-CM | POA: Diagnosis not present

## 2021-11-09 ENCOUNTER — Ambulatory Visit (INDEPENDENT_AMBULATORY_CARE_PROVIDER_SITE_OTHER): Payer: Medicare Other

## 2021-11-09 DIAGNOSIS — I495 Sick sinus syndrome: Secondary | ICD-10-CM

## 2021-11-09 LAB — CUP PACEART REMOTE DEVICE CHECK
Battery Remaining Longevity: 86 mo
Battery Remaining Percentage: 84 %
Battery Voltage: 3.01 V
Brady Statistic AP VP Percent: 32 %
Brady Statistic AP VS Percent: 20 %
Brady Statistic AS VP Percent: 13 %
Brady Statistic AS VS Percent: 24 %
Brady Statistic RA Percent Paced: 49 %
Brady Statistic RV Percent Paced: 45 %
Date Time Interrogation Session: 20230529020025
Implantable Lead Implant Date: 20210826
Implantable Lead Implant Date: 20210826
Implantable Lead Location: 753859
Implantable Lead Location: 753860
Implantable Pulse Generator Implant Date: 20210826
Lead Channel Impedance Value: 460 Ohm
Lead Channel Impedance Value: 560 Ohm
Lead Channel Pacing Threshold Amplitude: 1.25 V
Lead Channel Pacing Threshold Amplitude: 1.25 V
Lead Channel Pacing Threshold Pulse Width: 0.4 ms
Lead Channel Pacing Threshold Pulse Width: 0.5 ms
Lead Channel Sensing Intrinsic Amplitude: 2.7 mV
Lead Channel Sensing Intrinsic Amplitude: 5 mV
Lead Channel Setting Pacing Amplitude: 2.5 V
Lead Channel Setting Pacing Amplitude: 2.5 V
Lead Channel Setting Pacing Pulse Width: 0.4 ms
Lead Channel Setting Sensing Sensitivity: 2 mV
Pulse Gen Model: 2272
Pulse Gen Serial Number: 3859348

## 2021-11-15 DIAGNOSIS — M1712 Unilateral primary osteoarthritis, left knee: Secondary | ICD-10-CM | POA: Diagnosis not present

## 2021-11-21 ENCOUNTER — Other Ambulatory Visit (HOSPITAL_BASED_OUTPATIENT_CLINIC_OR_DEPARTMENT_OTHER): Payer: Self-pay | Admitting: Cardiology

## 2021-11-21 ENCOUNTER — Other Ambulatory Visit: Payer: Self-pay | Admitting: Internal Medicine

## 2021-11-21 DIAGNOSIS — I480 Paroxysmal atrial fibrillation: Secondary | ICD-10-CM

## 2021-11-21 DIAGNOSIS — R0609 Other forms of dyspnea: Secondary | ICD-10-CM

## 2021-11-21 DIAGNOSIS — R600 Localized edema: Secondary | ICD-10-CM

## 2021-11-22 NOTE — Telephone Encounter (Signed)
Prescription refill request for Eliquis received. Indication: Afib  Last office visit: 10/01/21 Quentin Ore)  Scr: 1.15 (10/15/21) Age: 66 Weight: 125.5kg  Appropriate dose and refill sent to requested pharmacy.

## 2021-11-25 NOTE — Progress Notes (Signed)
Remote pacemaker transmission.   

## 2021-11-30 ENCOUNTER — Encounter (HOSPITAL_COMMUNITY): Payer: Self-pay | Admitting: Psychiatry

## 2021-11-30 ENCOUNTER — Ambulatory Visit (INDEPENDENT_AMBULATORY_CARE_PROVIDER_SITE_OTHER): Payer: Medicare Other | Admitting: Psychiatry

## 2021-11-30 DIAGNOSIS — F331 Major depressive disorder, recurrent, moderate: Secondary | ICD-10-CM | POA: Diagnosis not present

## 2021-11-30 DIAGNOSIS — F3181 Bipolar II disorder: Secondary | ICD-10-CM | POA: Diagnosis not present

## 2021-11-30 DIAGNOSIS — F5102 Adjustment insomnia: Secondary | ICD-10-CM | POA: Diagnosis not present

## 2021-11-30 MED ORDER — BUPROPION HCL ER (SR) 150 MG PO TB12: 300.0000 mg | ORAL_TABLET | Freq: Every day | ORAL | 0 refills | Status: DC

## 2021-11-30 MED ORDER — LAMOTRIGINE 100 MG PO TABS: 100.0000 mg | ORAL_TABLET | Freq: Every day | ORAL | 0 refills | Status: DC

## 2021-11-30 NOTE — Progress Notes (Signed)
[facility] Follow up visit   Patient Identification: Mary Mcfarland MRN:  [id number] Date of Evaluation:  11/30/2021 Referral Source: primary care Chief Complaint:  depression follow up  Visit Diagnosis:    ICD-10-CM   1. Bipolar 2 disorder (HCC)  F31.81     2. Adjustment insomnia  F51.02     3. MDD (major depressive disorder), recurrent episode, moderate (HCC)  F33.1         History of Present Illness: Patient is a 66 years old currently single Caucasian female initially referred by primary care physician and cardiology for management and establishment of depression She has been seeing a psychiatrist for many years he closed the practice and she started seeing a psychiatrist at [facility] this year till they closed.    Has been doing fair but recently her Uncle died and had aphasia, she is going thru grief  Has daughter lives near by Stress can fluctuate, wants to get back in therapy     Aggravating factors; health issues Modifying factors; grand kids Severity; baseline but recent death in family   Patient denies drug use or alcohol use     Past Psychiatric History: Depression, possible bipolar  Previous Psychotropic Medications: Yes     Past Medical History:  Past Medical History:  Diagnosis Date   Asthma    Atrial fibrillation ([place])    Back pain    Bipolar disorder ([place])    Constipation    Depression    Difficult intubation    Narrow airway   Heartburn    High cholesterol    Hypertension    Longstanding persistent atrial fibrillation (HCC)    Palpitations    Pinched vertebral nerve    sciatica   Prediabetes    Sleep apnea    sleep study was "inconclusive" per patient   SOB (shortness of breath)    Swelling of both lower extremities    Visit for monitoring Tikosyn therapy 08/27/2019   Vitamin D deficiency     Past Surgical History:  Procedure Laterality Date   bone spur surgery     CARDIOVERSION N/A 07/24/2019   Procedure: CARDIOVERSION;   Surgeon: Buford Dresser, MD;  Location: [facility];  Service: Cardiovascular;  Laterality: N/A;   COLONOSCOPY     FOOT SURGERY  2017   HEEL SPUR RESECTION Left 03/15/2016   Procedure: ACHILLIES TENDON REPAIR AND PUMP BUMP EXCISION;  Surgeon: Melrose Nakayama, MD;  Location: [facility];  Service: Orthopedics;  Laterality: Left;   PACEMAKER IMPLANT N/A 02/06/2020   Procedure: PACEMAKER IMPLANT;  Surgeon: Thompson Grayer, MD;  Location: [facility];  Service: Cardiovascular;  Laterality: N/A;   SHOULDER SURGERY  2010   TOE SURGERY  2011   TONSILLECTOMY      Family Psychiatric History: Mom diagnosed with possible bipolar  Family History:  Family History  Problem Relation Age of Onset   Heart disease Mother    CVA Mother    Hypertension Mother    Cancer Mother    Stroke Mother    Depression Mother    Bipolar disorder Mother    Obesity Mother    Heart disease Father    Hypertension Father    Heart attack Father    Diabetes Father    Liver disease Father    Alcoholism Father    Drug abuse Father    Obesity Father     Social History:   Social History   Socioeconomic History   Marital status: Divorced  Spouse name: Not on file   Number of children: Not on file   Years of education: Not on file   Highest education level: Not on file  Occupational History   Occupation: retired  Tobacco Use   Smoking status: Former    Packs/day: 1.00    Types: Cigarettes    Quit date: 02/13/1996    Years since quitting: 25.8   Smokeless tobacco: Never   Tobacco comments:    Former smoker 02/02/21  Vaping Use   Vaping Use: Never used  Substance and Sexual Activity   Alcohol use: Yes    Alcohol/week: 1.0 - 2.0 standard drink of alcohol    Types: 1 - 2 Standard drinks or equivalent per week    Comment: occassionally   Drug use: Never   Sexual activity: Not on file  Other Topics Concern   Not on file  Social History Narrative   Lives in [place] alone   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)   Social Determinants of Health   Financial Resource Strain: Not on file  Food Insecurity: Not on file  Transportation Needs: Not on file  Physical Activity: Not on file  Stress: Not on file  Social Connections: Not on file     Allergies:   Allergies  Allergen Reactions   Niacin Hives    Metabolic Disorder Labs: Lab Results  Component Value Date   HGBA1C 6.1 (H) 01/14/2020   No results found for: "PROLACTIN" Lab Results  Component Value Date   CHOL 168 01/14/2020   TRIG 78 01/14/2020   HDL 61 01/14/2020   CHOLHDL 3.1 11/13/2019   LDLCALC 92 01/14/2020   LDLCALC 101 (H) 11/13/2019   Lab Results  Component Value Date   TSH 3.610 01/14/2020    Therapeutic Level Labs: No results found for: "LITHIUM" No results found for: "CBMZ" No results found for: "VALPROATE"  Current Medications: See chart    Psychiatric Specialty Exam: Review of Systems  Cardiovascular:  Negative for chest pain.  Psychiatric/Behavioral:  Negative for agitation and hallucinations.     Last menstrual period 09/24/2013.There is no height or weight on file to calculate BMI.  General Appearance: Casual  Eye Contact:  Fair  Speech:  Normal Rate  Volume:  Normal  Mood: Fair  Affect:  Congruent  Thought Process:  Goal Directed  Orientation:  Full (Time, Place, and Person)  Thought Content:  Rumination  Suicidal Thoughts:  No  Homicidal Thoughts:  No  Memory:  Immediate;   Fair Recent;   Fair  Judgement:  Fair  Insight:  Shallow  Psychomotor Activity:  decreased  Concentration:  Concentration: Fair and Attention Span: Fair  Recall:  [employer] [place]: Good  Akathisia:  No  Handed:   AIMS (if indicated):  not done  Assets:  Desire for Improvement Housing Social Support  ADL's:  Intact  Cognition: WNL  Sleep:   variable   Screenings: PHQ2-9    [facility] Office Visit from 01/14/2020 in [place]  PHQ-2 Total  Score 6  PHQ-9 Total Score 14      [place] ED from 10/15/2021 in [facility] Video Visit from 07/16/2021 in [place] [facility] [facility] from 07/10/2021 in [facility]  C-SSRS RISK CATEGORY No Risk No Risk No Risk       Assessment and Plan: as follows   Prior documentation reviewed Bipolar 2 ; depressed ; somewhat subdued, due  to grief, continue lamicatl wellbutrin, consider therapy, she doesn't want to increase med No rash on lamictal Depression; manageable continue Lamictal and Wellbutrin, no rash Insomnia:irregular, reviewed sleep hygiene  3 to 4 months in office follow-up  Direct care time in office 15 - 20 min including face to face, chart review and documentation  Merian Capron, MD 6/20/20233:10 PM

## 2021-12-02 IMAGING — CR DG CHEST 2V
3 series · 3 of 3 positions shown · non-contrast
Comparison: Chest x-ray 10/03/2019.

CLINICAL DATA: Pacemaker placement.

EXAM:
CHEST - 2 VIEW

[chest lat (1 of 2)]
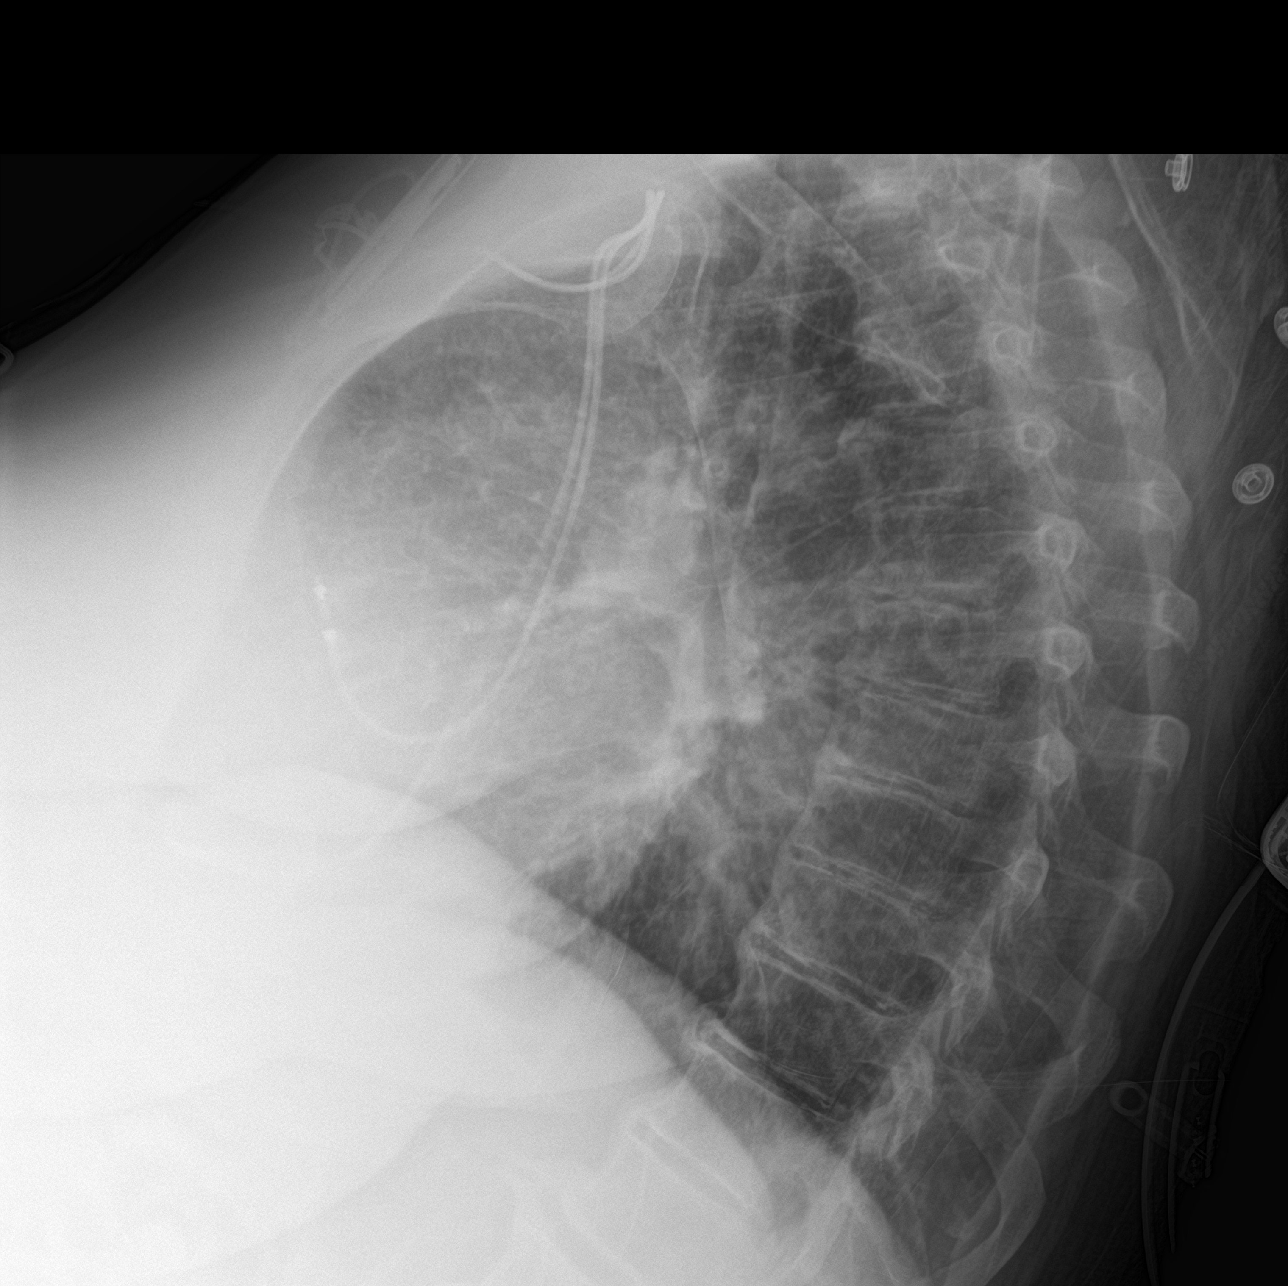

[chest ap]
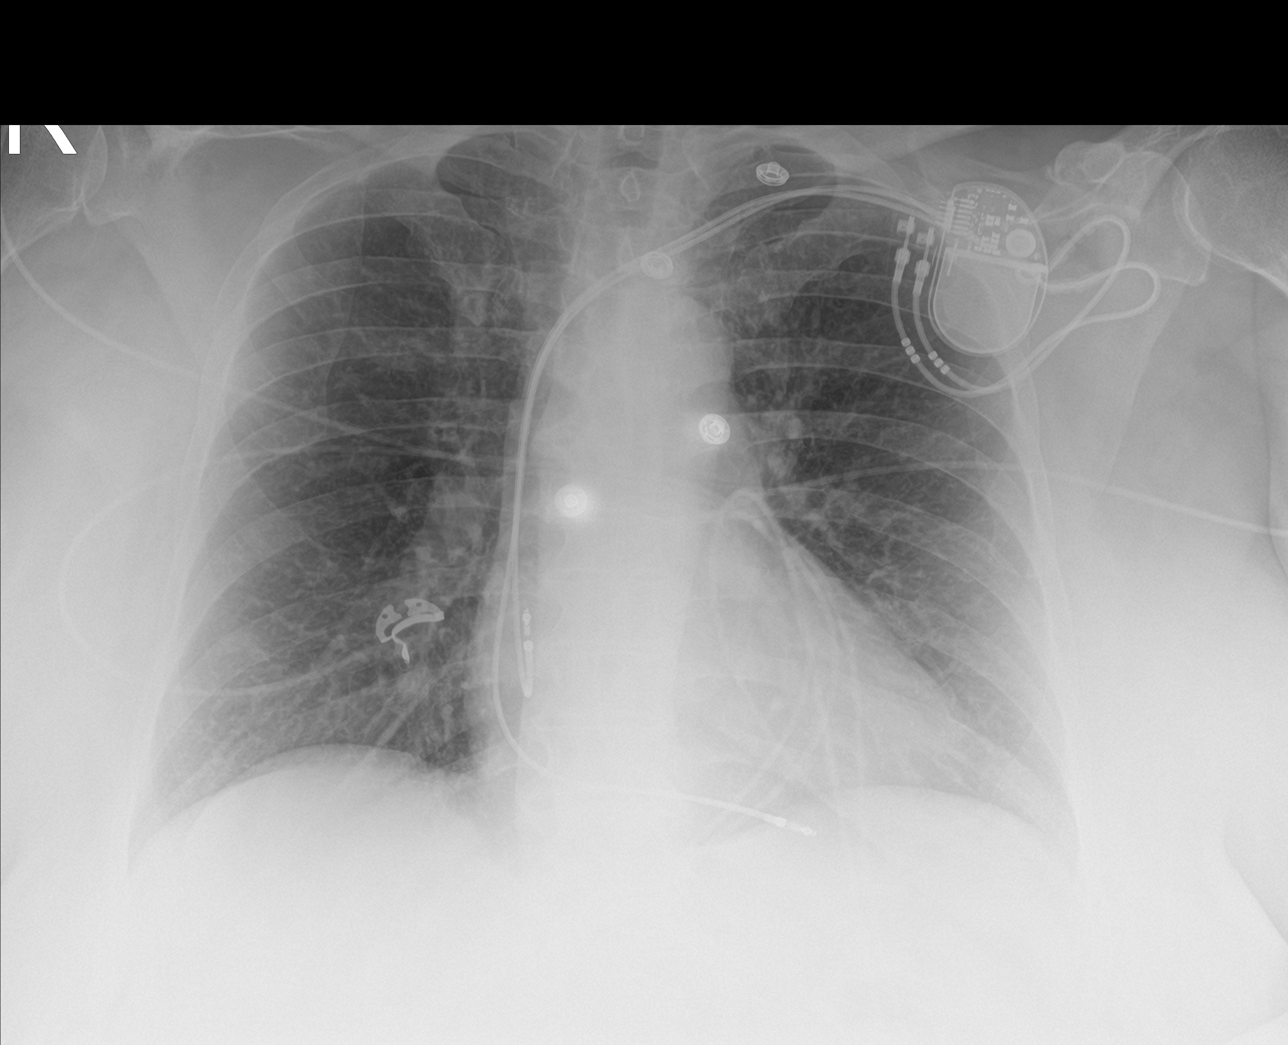

[chest lat (2 of 2)]
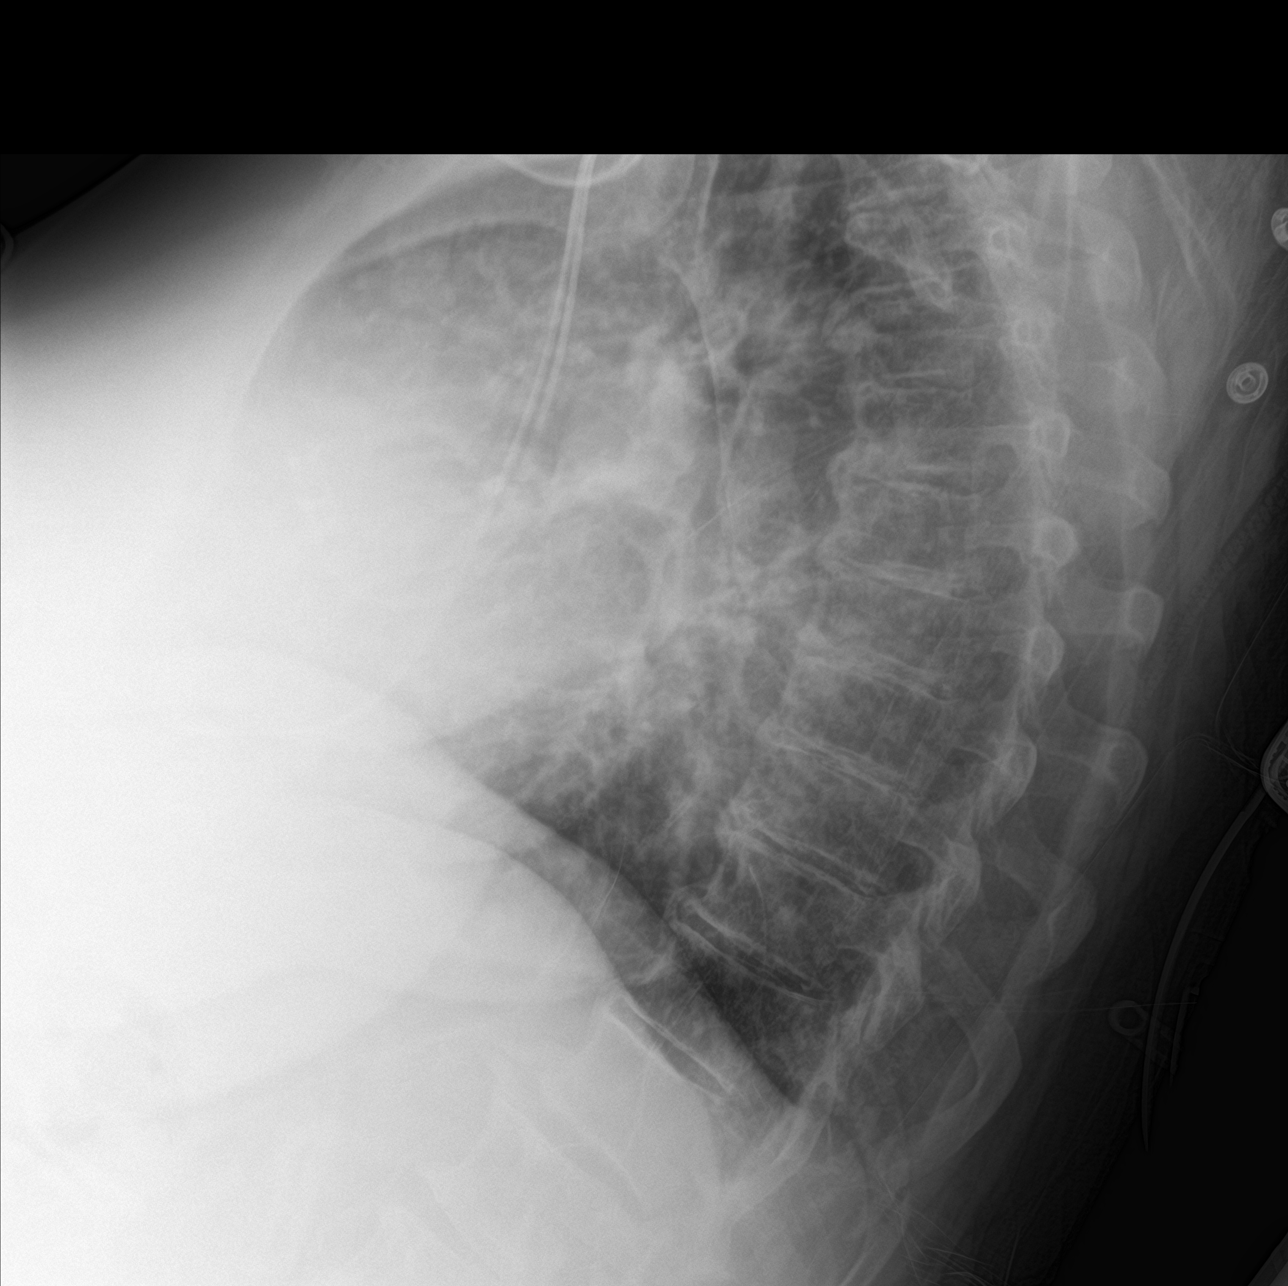

[3 of 3 positions shown; findings below may reference images not displayed]

FINDINGS: Cardiac pacer with lead tips in right atrium right ventricle. Heart
size normal. Normal pulmonary vascularity. No focal infiltrate. No
pleural effusion or pneumothorax. Degenerative change thoracic
spine.
IMPRESSION: 1. Cardiac pacer with lead tips in right atrium right ventricle.
Heart size normal. No evidence of pneumothorax.

2.  No acute pulmonary disease.

## 2021-12-06 DIAGNOSIS — M1712 Unilateral primary osteoarthritis, left knee: Secondary | ICD-10-CM | POA: Diagnosis not present

## 2021-12-13 DIAGNOSIS — M1712 Unilateral primary osteoarthritis, left knee: Secondary | ICD-10-CM | POA: Diagnosis not present

## 2021-12-16 ENCOUNTER — Encounter (HOSPITAL_BASED_OUTPATIENT_CLINIC_OR_DEPARTMENT_OTHER): Payer: Self-pay

## 2021-12-16 DIAGNOSIS — R202 Paresthesia of skin: Secondary | ICD-10-CM | POA: Diagnosis not present

## 2021-12-16 DIAGNOSIS — I10 Essential (primary) hypertension: Secondary | ICD-10-CM | POA: Diagnosis not present

## 2021-12-16 DIAGNOSIS — G4700 Insomnia, unspecified: Secondary | ICD-10-CM | POA: Diagnosis not present

## 2021-12-20 DIAGNOSIS — M1712 Unilateral primary osteoarthritis, left knee: Secondary | ICD-10-CM | POA: Diagnosis not present

## 2021-12-22 DIAGNOSIS — E559 Vitamin D deficiency, unspecified: Secondary | ICD-10-CM | POA: Diagnosis not present

## 2021-12-22 DIAGNOSIS — N816 Rectocele: Secondary | ICD-10-CM | POA: Diagnosis not present

## 2021-12-22 DIAGNOSIS — Z1231 Encounter for screening mammogram for malignant neoplasm of breast: Secondary | ICD-10-CM | POA: Diagnosis not present

## 2021-12-22 DIAGNOSIS — Z1211 Encounter for screening for malignant neoplasm of colon: Secondary | ICD-10-CM | POA: Diagnosis not present

## 2021-12-22 NOTE — Telephone Encounter (Signed)
Routed to Dr Harrell Gave for review

## 2021-12-22 NOTE — Telephone Encounter (Signed)
Device alert for AF, VS<2:1 VS 3.60m, trend ~ 522mPresenting rhythm AF with mean HR ~100, ongoing from 7/10 @ 20:03 Hx of PAF, Eliquis, Tikosyn, Diltizem, burden 1.5% Route to triage per protocol [place]  Routing to Dr. ChHarrell Gaveor medication management.

## 2021-12-30 ENCOUNTER — Ambulatory Visit (HOSPITAL_COMMUNITY): Payer: Medicare Other | Admitting: Licensed Clinical Social Worker

## 2022-01-17 ENCOUNTER — Ambulatory Visit: Payer: PPO | Admitting: Pulmonary Disease

## 2022-01-18 DIAGNOSIS — R7303 Prediabetes: Secondary | ICD-10-CM | POA: Diagnosis not present

## 2022-01-18 DIAGNOSIS — E782 Mixed hyperlipidemia: Secondary | ICD-10-CM | POA: Diagnosis not present

## 2022-01-18 DIAGNOSIS — K219 Gastro-esophageal reflux disease without esophagitis: Secondary | ICD-10-CM | POA: Diagnosis not present

## 2022-01-19 ENCOUNTER — Encounter (INDEPENDENT_AMBULATORY_CARE_PROVIDER_SITE_OTHER): Payer: Self-pay

## 2022-01-24 ENCOUNTER — Telehealth (HOSPITAL_BASED_OUTPATIENT_CLINIC_OR_DEPARTMENT_OTHER): Payer: Self-pay

## 2022-01-24 DIAGNOSIS — M7731 Calcaneal spur, right foot: Secondary | ICD-10-CM | POA: Diagnosis not present

## 2022-01-24 DIAGNOSIS — I700 Atherosclerosis of aorta: Secondary | ICD-10-CM | POA: Insufficient documentation

## 2022-01-24 NOTE — Telephone Encounter (Signed)
   Name: Mary Mcfarland  DOB: January 03, 1956  MRN: [id number]  Primary Cardiologist: Buford Dresser, MD   Preoperative team, please contact this patient and set up a phone call appointment for further preoperative risk assessment. Please obtain consent and complete medication review. Thank you for your help.  I confirm that guidance regarding antiplatelet and oral anticoagulation therapy has been completed and, if necessary, noted below.   Darreld Mclean, PA-C 01/24/2022, 6:48 PM [facility]

## 2022-01-24 NOTE — Telephone Encounter (Signed)
Pharmacy, can you please comment on how long Eliquis can be held for upcoming procedure?  Following this, we will need to set patient up for a telephone visit to complete pre-op risk assessment.  Thank you!

## 2022-01-24 NOTE — Telephone Encounter (Signed)
   Pre-operative Risk Assessment    Patient Name: Mary Mcfarland  DOB: Feb 02, 1956 MRN: [id number]      Request for Surgical Clearance    Procedure:   Right Heel Pump Bump Excision and Achilles Tendon Repair  Date of Surgery:  Clearance TBD                                 Surgeon:  Melrose Nakayama, MD Surgeon's Group or Practice Name:  [facility] Phone number:  [phone] Fax number:  [phone]   Type of Clearance Requested:   - Medical  - Pharmacy:  Hold Apixaban (Eliquis)     Type of Anesthesia:   Choice   Additional requests/questions:   None  [name]   01/24/2022, 1:47 PM

## 2022-01-24 NOTE — Telephone Encounter (Signed)
Patient with diagnosis of Afib on Eliquis for anticoagulation.    Procedure: Right Heel Pump Bump Excision and Achilles Tendon Repair Date of procedure: TBD   CHA2DS2-VASc Score = 3  This indicates a 3.2% annual risk of stroke. The patient's score is based upon: CHF History: 0 HTN History: 1 Diabetes History: 0 Stroke History: 0 Vascular Disease History: 1 Age Score: 0 Gender Score: 1   CrCl 67 ml/min using AdjBW Platelet count 324k  Per office protocol, patient can hold Eliquis for 1-2 days prior to procedure.

## 2022-01-25 ENCOUNTER — Telehealth: Payer: Self-pay | Admitting: *Deleted

## 2022-01-25 NOTE — Telephone Encounter (Signed)
Pt agreeable to plan of care for tele pre op appt 03/01/22 at 9 am. Med rec and consent are done.     Patient Consent for Virtual Visit        Mary Mcfarland has provided verbal consent on 01/25/2022 for a virtual visit (video or telephone).   CONSENT FOR VIRTUAL VISIT FOR:  Mary Mcfarland  By participating in this virtual visit I agree to the following:  I hereby voluntarily request, consent and authorize [facility] and its employed or contracted physicians, physician assistants, nurse practitioners or other licensed health care professionals (the Practitioner), to provide me with telemedicine health care services (the "Services") as deemed necessary by the treating Practitioner. I acknowledge and consent to receive the Services by the Practitioner via telemedicine. I understand that the telemedicine visit will involve communicating with the Practitioner through live audiovisual communication technology and the disclosure of certain medical information by electronic transmission. I acknowledge that I have been given the opportunity to request an in-person assessment or other available alternative prior to the telemedicine visit and am voluntarily participating in the telemedicine visit.  I understand that I have the right to withhold or withdraw my consent to the use of telemedicine in the course of my care at any time, without affecting my right to future care or treatment, and that the Practitioner or I may terminate the telemedicine visit at any time. I understand that I have the right to inspect all information obtained and/or recorded in the course of the telemedicine visit and may receive copies of available information for a reasonable fee.  I understand that some of the potential risks of receiving the Services via telemedicine include:  Delay or interruption in medical evaluation due to technological equipment failure or disruption; Information transmitted may not be  sufficient (e.g. poor resolution of images) to allow for appropriate medical decision making by the Practitioner; and/or  In rare instances, security protocols could fail, causing a breach of personal health information.  Furthermore, I acknowledge that it is my responsibility to provide information about my medical history, conditions and care that is complete and accurate to the best of my ability. I acknowledge that Practitioner's advice, recommendations, and/or decision may be based on factors not within their control, such as incomplete or inaccurate data provided by me or distortions of diagnostic images or specimens that may result from electronic transmissions. I understand that the practice of medicine is not an exact science and that Practitioner makes no warranties or guarantees regarding treatment outcomes. I acknowledge that a copy of this consent can be made available to me via my patient portal ([facility]), or I can request a printed copy by calling the office of [facility].    I understand that my insurance will be billed for this visit.   I have read or had this consent read to me. I understand the contents of this consent, which adequately explains the benefits and risks of the Services being provided via telemedicine.  I have been provided ample opportunity to ask questions regarding this consent and the Services and have had my questions answered to my satisfaction. I give my informed consent for the services to be provided through the use of telemedicine in my medical care

## 2022-01-25 NOTE — Telephone Encounter (Signed)
Pt agreeable to plan of care for tele pre op appt 03/01/22 at 9 am. Med rec and consent are done.

## 2022-01-26 ENCOUNTER — Telehealth: Payer: Self-pay | Admitting: Pulmonary Disease

## 2022-01-26 DIAGNOSIS — G4700 Insomnia, unspecified: Secondary | ICD-10-CM | POA: Diagnosis not present

## 2022-01-26 DIAGNOSIS — G4733 Obstructive sleep apnea (adult) (pediatric): Secondary | ICD-10-CM | POA: Diagnosis not present

## 2022-01-26 DIAGNOSIS — Z01818 Encounter for other preprocedural examination: Secondary | ICD-10-CM | POA: Diagnosis not present

## 2022-01-26 DIAGNOSIS — I10 Essential (primary) hypertension: Secondary | ICD-10-CM | POA: Diagnosis not present

## 2022-01-26 DIAGNOSIS — R7303 Prediabetes: Secondary | ICD-10-CM | POA: Diagnosis not present

## 2022-01-26 NOTE — Telephone Encounter (Signed)
Fax received from Dr. Marlin Canary with [facility] to perform a RIGHT HEEL PUMP BUMP EXCISION AND ACHILLES TENDON REPAIR on patient.  Patient needs surgery clearance. Surgery is PENDING. Patient was seen on 10/14/2021. Office protocol is a risk assessment can be sent to surgeon if patient has been seen in 60 days or less.   Sending to Dr Loanne Drilling  for risk assessment or recommendations if patient needs to be seen in office prior to surgical procedure.    Patient has office visit with Dr Loanne Drilling on 01/31/2022 for surgical clearance

## 2022-01-26 NOTE — Telephone Encounter (Signed)
Will see in clinic

## 2022-01-28 ENCOUNTER — Ambulatory Visit (HOSPITAL_COMMUNITY): Payer: Medicare Other | Admitting: Licensed Clinical Social Worker

## 2022-01-28 ENCOUNTER — Encounter (HOSPITAL_COMMUNITY): Payer: Self-pay

## 2022-01-28 DIAGNOSIS — F5102 Adjustment insomnia: Secondary | ICD-10-CM

## 2022-01-28 DIAGNOSIS — F331 Major depressive disorder, recurrent, moderate: Secondary | ICD-10-CM

## 2022-01-28 DIAGNOSIS — F3181 Bipolar II disorder: Secondary | ICD-10-CM

## 2022-01-28 NOTE — Progress Notes (Signed)
Therapist contacted patient through My Chart for session and she did not show. Session is a no show

## 2022-01-31 ENCOUNTER — Ambulatory Visit: Payer: Medicare Other | Admitting: Pulmonary Disease

## 2022-02-07 ENCOUNTER — Ambulatory Visit (INDEPENDENT_AMBULATORY_CARE_PROVIDER_SITE_OTHER): Payer: Medicare Other

## 2022-02-07 DIAGNOSIS — I495 Sick sinus syndrome: Secondary | ICD-10-CM

## 2022-02-08 LAB — CUP PACEART REMOTE DEVICE CHECK
Battery Remaining Longevity: 82 mo
Battery Remaining Percentage: 82 %
Battery Voltage: 3.01 V
Brady Statistic AP VP Percent: 35 %
Brady Statistic AP VS Percent: 21 %
Brady Statistic AS VP Percent: 13 %
Brady Statistic AS VS Percent: 21 %
Brady Statistic RA Percent Paced: 52 %
Brady Statistic RV Percent Paced: 47 %
Date Time Interrogation Session: 20230829005209
Implantable Lead Implant Date: 20210826
Implantable Lead Implant Date: 20210826
Implantable Lead Location: 753859
Implantable Lead Location: 753860
Implantable Pulse Generator Implant Date: 20210826
Lead Channel Impedance Value: 490 Ohm
Lead Channel Impedance Value: 530 Ohm
Lead Channel Pacing Threshold Amplitude: 1.25 V
Lead Channel Pacing Threshold Amplitude: 1.25 V
Lead Channel Pacing Threshold Pulse Width: 0.4 ms
Lead Channel Pacing Threshold Pulse Width: 0.5 ms
Lead Channel Sensing Intrinsic Amplitude: 2.7 mV
Lead Channel Sensing Intrinsic Amplitude: 4.1 mV
Lead Channel Setting Pacing Amplitude: 2.5 V
Lead Channel Setting Pacing Amplitude: 2.5 V
Lead Channel Setting Pacing Pulse Width: 0.4 ms
Lead Channel Setting Sensing Sensitivity: 2 mV
Pulse Gen Model: 2272
Pulse Gen Serial Number: 3859348

## 2022-02-17 NOTE — Telephone Encounter (Signed)
Procedure moved to do in 2024. No clearance needed at this time. Nothing further needed

## 2022-02-21 DIAGNOSIS — J0100 Acute maxillary sinusitis, unspecified: Secondary | ICD-10-CM | POA: Diagnosis not present

## 2022-02-21 DIAGNOSIS — E559 Vitamin D deficiency, unspecified: Secondary | ICD-10-CM | POA: Diagnosis not present

## 2022-02-22 ENCOUNTER — Ambulatory Visit (HOSPITAL_COMMUNITY): Payer: Medicare Other | Admitting: Psychiatry

## 2022-02-25 ENCOUNTER — Other Ambulatory Visit (HOSPITAL_COMMUNITY): Payer: Self-pay | Admitting: Psychiatry

## 2022-03-01 ENCOUNTER — Ambulatory Visit: Payer: Medicare Other | Attending: Interventional Cardiology | Admitting: Physician Assistant

## 2022-03-01 DIAGNOSIS — Z01810 Encounter for preprocedural cardiovascular examination: Secondary | ICD-10-CM

## 2022-03-01 NOTE — Progress Notes (Signed)
Virtual Visit via Telephone Note   Because of Mary Mcfarland's co-morbid illnesses, she is at least at moderate risk for complications without adequate follow up.  This format is felt to be most appropriate for this patient at this time.  The patient did not have access to video technology/had technical difficulties with video requiring transitioning to audio format only (telephone).  All issues noted in this document were discussed and addressed.  No physical exam could be performed with this format.  Please refer to the patient's chart for her consent to telehealth for [facility].  Evaluation Performed:  Preoperative cardiovascular risk assessment _____________   Date:  03/01/2022   Patient ID:  Mary Mcfarland, Mary Mcfarland, MRN [id number] Patient Location:  Home Provider location:   Office  Primary Care Provider:  Lawerance Cruel, MD Primary Cardiologist:  Mary Dresser, MD  Chief Complaint / Patient Profile   66 y.o. y/o female with a h/o atrial fibrillation, sinus pauses status post PPM, hypertension, hyperlipidemia, obesity who is pending right heel pump bump excision and Achilles tendon repair and presents today for telephonic preoperative cardiovascular risk assessment.  Past Medical History    Past Medical History:  Diagnosis Date   Asthma    Atrial fibrillation (HCC)    Back pain    Bipolar disorder (HCC)    Constipation    Depression    Difficult intubation    Narrow airway   Heartburn    High cholesterol    Hypertension    Longstanding persistent atrial fibrillation (HCC)    Palpitations    Pinched vertebral nerve    sciatica   Prediabetes    Sleep apnea    sleep study was "inconclusive" per patient   SOB (shortness of breath)    Swelling of both lower extremities    Visit for monitoring Tikosyn therapy 08/27/2019   Vitamin D deficiency    Past Surgical History:  Procedure Laterality Date   bone spur surgery      CARDIOVERSION N/A 07/24/2019   Procedure: CARDIOVERSION;  Surgeon: Mary Dresser, MD;  Location: [facility];  Service: Cardiovascular;  Laterality: N/A;   COLONOSCOPY     FOOT SURGERY  2017   HEEL SPUR RESECTION Left 03/15/2016   Procedure: ACHILLIES TENDON REPAIR AND PUMP BUMP EXCISION;  Surgeon: Mary Nakayama, MD;  Location: [facility];  Service: Orthopedics;  Laterality: Left;   PACEMAKER IMPLANT N/A 02/06/2020   Procedure: PACEMAKER IMPLANT;  Surgeon: Mary Grayer, MD;  Location: [facility];  Service: Cardiovascular;  Laterality: N/A;   SHOULDER SURGERY  2010   TOE SURGERY  2011   TONSILLECTOMY      Allergies  Allergies  Allergen Reactions   Niacin Hives    History of Present Illness    Mary Mcfarland is a 66 y.o. female who presents via audio/video conferencing for a telehealth visit today.  Pt was last seen in cardiology clinic on 08/30/2021 by Dr. Harrell Mcfarland.  At that time Mary Mcfarland was doing well .  The patient is now pending procedure as outlined above. Since her last visit, she has not had any chest pain or palpitations.  She does have some shortness of breath but she has always had this.  It has been an ongoing thing.  She tells me that she does have some trouble getting around with her heel but does have to go up and down stairs daily because she lives in an apartment.  She also  does some swimming because she belongs to [employer].  Because of this, she is scored a 5.99 METS on the DASI.  This will exceeds the minimum 4 METS requirement.  She can hold her Eliquis 1-2 days prior to her procedure.  She can restart her Eliquis when is medically safe to do so. Home Medications    Prior to Admission medications   Medication Sig Start Date End Date Taking? Authorizing Provider  albuterol (PROVENTIL HFA;VENTOLIN HFA) 108 (90 Base) MCG/ACT inhaler Inhale 2 puffs into the lungs every 6 (six) hours as needed for wheezing or shortness of breath.     [provider]  atorvastatin (LIPITOR) 40 MG tablet TAKE ONE TABLET BY MOUTH ONCE DAILY 11/22/21   Mary Dresser, MD  buPROPion [facility] SR) 150 MG 12 hr tablet TAKE TWO TABLETS BY MOUTH DAILY 02/25/22   Merian Capron, MD  ciprofloxacin (CIPRO) 500 MG tablet Take 1 tablet (500 mg total) by mouth 2 (two) times daily. Patient not taking: Reported on 01/25/2022 10/15/21   Dorie Rank, MD  DILT-XR 120 MG 24 hr capsule TAKE ONE CAPSULE BY MOUTH ONCE DAILY 11/22/21   Mary Dresser, MD  diltiazem (CARDIZEM CD) 120 MG 24 hr capsule Take 120 mg by mouth daily.    [provider]  diphenhydrAMINE (BENADRYL) 25 MG tablet Take 1 tablet (25 mg total) by mouth every 6 (six) hours as needed. 10/15/21   Dorie Rank, MD  dofetilide (TIKOSYN) 250 MCG capsule Take 1 capsule (250 mcg total) by mouth 2 (two) times daily. 10/01/21   Vickie Epley, MD  ELIQUIS 5 MG TABS tablet TAKE ONE TABLET BY MOUTH TWICE DAILY 11/22/21   Vickie Epley, MD  fluticasone furoate-vilanterol (BREO ELLIPTA) 100-25 MCG/ACT AEPB Inhale 1 puff into the lungs daily. 10/14/21   Margaretha Seeds, MD  furosemide (LASIX) 40 MG tablet TAKE ONE TABLET BY MOUTH TWICE DAILY. take afternoon DOSE ONLY AS NEEDED 11/22/21   Mary Dresser, MD  lamoTRIgine (LAMICTAL) 100 MG tablet TAKE ONE TABLET BY MOUTH DAILY 02/25/22   Merian Capron, MD  Levocetirizine Dihydrochloride (XYZAL PO) Take 1 tablet by mouth daily as needed.    [provider]  lisinopril (ZESTRIL) 10 MG tablet Take 10 mg by mouth daily. 01/21/21   [provider]  Naphazoline-Pheniramine (OPCON-A) 0.027-0.315 % SOLN Place 1-2 drops into both eyes 3 (three) times daily as needed (for allergies).    [provider]  omeprazole (PRILOSEC) 20 MG capsule Take 20 mg by mouth daily before breakfast.     [provider]  valACYclovir (VALTREX) 1000 MG tablet Take 1 tablet (1,000 mg total) by mouth 3 (three) times  daily. Patient not taking: Reported on 01/25/2022 07/28/20   Brunetta Jeans, PA-C  zolpidem (AMBIEN) 10 MG tablet 1/2-1 tablet at bedtime as needed 08/04/20   [provider]    Physical Exam    Vital Signs:  Mary Mcfarland does not have vital signs available for review today.  Given telephonic nature of communication, physical exam is limited. AAOx3. NAD. Normal affect.  Speech and respirations are unlabored.  Accessory Clinical Findings    None  Assessment & Plan    1.  Preoperative Cardiovascular Risk Assessment:  Ms. Mcfarland's perioperative risk of a major cardiac event is 0.4% according to the Revised Cardiac Risk Index (RCRI).  Therefore, she is at low risk for perioperative complications.   Her functional capacity is good at 5.99 METs according to the  [facility] Activity Status Index (DASI). Recommendations: According to ACC/AHA guidelines, no further cardiovascular testing needed.  The patient may proceed to surgery at acceptable risk.   Antiplatelet and/or Anticoagulation Recommendations:  Eliquis (Apixaban) can be held for 1-2 days prior to surgery.  Please resume post op when felt to be safe.     A copy of this note will be routed to requesting surgeon.  Time:   Today, I have spent 15 minutes with the patient with telehealth technology discussing medical history, symptoms, and management plan.     Elgie Collard, PA-C  03/01/2022, 9:00 AM

## 2022-03-02 ENCOUNTER — Ambulatory Visit: Payer: Medicare Other | Admitting: Pulmonary Disease

## 2022-03-02 NOTE — Progress Notes (Signed)
Remote pacemaker transmission.   

## 2022-03-16 ENCOUNTER — Telehealth: Payer: Self-pay | Admitting: *Deleted

## 2022-03-16 NOTE — Telephone Encounter (Signed)
   Pre-operative Risk Assessment    Patient Name: Mary Mcfarland  DOB: [birth year] MRN: [id number]     Request for Surgical Clearance    Procedure:   TEETH BLEACHING, CORE BUILD UP's AND CROWNS ; CONFIRMED THERE ARE NOT TEETH TO BE EXTRACTED; PT WILL BE HAVING ALL PROCEDURES DONE 03/24/22 IN ONE VISIT  Date of Surgery:  Clearance 03/24/22                                 Surgeon:  DR. Berniece Salines OR DR. GREGG Surgeon's Group or Practice Name:  [facility] Phone number:  [phone] Fax number:  [phone]   Type of Clearance Requested:   - Medical  - Pharmacy:  Hold Apixaban (Eliquis)     Type of Anesthesia:   CONSCIOUS SEDATION/NITROUS    Additional requests/questions:    Jiles Prows   03/16/2022, 9:18 AM

## 2022-03-16 NOTE — Telephone Encounter (Signed)
   Primary Cardiologist: Buford Dresser, MD  Chart reviewed as part of pre-operative protocol coverage. Simple dental procedures are considered low risk procedures per guidelines and generally do not require any specific cardiac clearance. It is also generally accepted that for simple extractions and dental cleanings, there is no need to interrupt blood thinner therapy.   SBE prophylaxis is not required for the patient.  I will route this recommendation to the requesting party via Epic fax function and remove from pre-op pool.  Please call with questions.  Emmaline Life, NP-C  03/16/2022, 9:29 AM [address] Office [phone] Fax [phone]

## 2022-03-21 ENCOUNTER — Ambulatory Visit (INDEPENDENT_AMBULATORY_CARE_PROVIDER_SITE_OTHER): Payer: Medicare Other | Admitting: Licensed Clinical Social Worker

## 2022-03-21 DIAGNOSIS — F331 Major depressive disorder, recurrent, moderate: Secondary | ICD-10-CM

## 2022-03-21 DIAGNOSIS — F3181 Bipolar II disorder: Secondary | ICD-10-CM

## 2022-03-21 DIAGNOSIS — F5102 Adjustment insomnia: Secondary | ICD-10-CM | POA: Diagnosis not present
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
## 2022-03-28 DIAGNOSIS — I4891 Unspecified atrial fibrillation: Secondary | ICD-10-CM | POA: Diagnosis not present

## 2022-03-29 ENCOUNTER — Encounter (HOSPITAL_COMMUNITY): Payer: Self-pay | Admitting: Psychiatry

## 2022-03-29 ENCOUNTER — Ambulatory Visit (INDEPENDENT_AMBULATORY_CARE_PROVIDER_SITE_OTHER): Payer: Medicare Other | Admitting: Psychiatry

## 2022-03-29 ENCOUNTER — Ambulatory Visit (HOSPITAL_COMMUNITY): Payer: Medicare Other | Admitting: Psychiatry

## 2022-03-29 VITALS — BP 132/86 | HR 94 | Temp 98.6°F | Ht 66.0 in | Wt 255.0 lb

## 2022-03-29 DIAGNOSIS — F331 Major depressive disorder, recurrent, moderate: Secondary | ICD-10-CM | POA: Diagnosis not present

## 2022-03-29 DIAGNOSIS — F3181 Bipolar II disorder: Secondary | ICD-10-CM | POA: Diagnosis not present

## 2022-03-29 DIAGNOSIS — F5102 Adjustment insomnia: Secondary | ICD-10-CM

## 2022-03-29 NOTE — Progress Notes (Signed)
[facility] Follow up visit   Patient Identification: Mary Mcfarland MRN:  [id number] Date of Evaluation:  03/29/2022 Referral Source: primary care Chief Complaint:  depression follow up  Visit Diagnosis:    ICD-10-CM   1. Bipolar 2 disorder (HCC)  F31.81     2. Adjustment insomnia  F51.02     3. MDD (major depressive disorder), recurrent episode, moderate (HCC)  F33.1         History of Present Illness: Patient is a 66 years old currently single Caucasian female initially referred by primary care physician and cardiology for management and establishment of depression She has been seeing a psychiatrist for many years he closed the practice and she started seeing a psychiatrist at [facility] this year till they closed.    Handling grief, tolerating meds and depression manageable Follows with cardiology     Aggravating factors; health issues Modifying factors; grand kids Severity; baseline but recent death in family   Patient denies drug use or alcohol use     Past Psychiatric History: Depression, possible bipolar  Previous Psychotropic Medications: Yes     Past Medical History:  Past Medical History:  Diagnosis Date   Asthma    Atrial fibrillation (HCC)    Back pain    Bipolar disorder (HCC)    Constipation    Depression    Difficult intubation    Narrow airway   Heartburn    High cholesterol    Hypertension    Longstanding persistent atrial fibrillation (HCC)    Palpitations    Pinched vertebral nerve    sciatica   Prediabetes    Sleep apnea    sleep study was "inconclusive" per patient   SOB (shortness of breath)    Swelling of both lower extremities    Visit for monitoring Tikosyn therapy 08/27/2019   Vitamin D deficiency     Past Surgical History:  Procedure Laterality Date   bone spur surgery     CARDIOVERSION N/A 07/24/2019   Procedure: CARDIOVERSION;  Surgeon: Buford Dresser, MD;  Location: [facility];  Service: Cardiovascular;   Laterality: N/A;   COLONOSCOPY     FOOT SURGERY  2017   HEEL SPUR RESECTION Left 03/15/2016   Procedure: ACHILLIES TENDON REPAIR AND PUMP BUMP EXCISION;  Surgeon: Melrose Nakayama, MD;  Location: [facility];  Service: Orthopedics;  Laterality: Left;   PACEMAKER IMPLANT N/A 02/06/2020   Procedure: PACEMAKER IMPLANT;  Surgeon: Thompson Grayer, MD;  Location: [facility];  Service: Cardiovascular;  Laterality: N/A;   SHOULDER SURGERY  2010   TOE SURGERY  2011   TONSILLECTOMY      Family Psychiatric History: Mom diagnosed with possible bipolar  Family History:  Family History  Problem Relation Age of Onset   Heart disease Mother    CVA Mother    Hypertension Mother    Cancer Mother    Stroke Mother    Depression Mother    Bipolar disorder Mother    Obesity Mother    Heart disease Father    Hypertension Father    Heart attack Father    Diabetes Father    Liver disease Father    Alcoholism Father    Drug abuse Father    Obesity Father     Social History:   Social History   Socioeconomic History   Marital status: Divorced    Spouse name: Not on file   Number of children: Not on file   Years of education: Not on file  Highest education level: Not on file  Occupational History   Occupation: retired  Tobacco Use   Smoking status: Former    Packs/day: 1.00    Types: Cigarettes    Quit date: 02/13/1996    Years since quitting: 26.1   Smokeless tobacco: Never   Tobacco comments:    Former smoker 02/02/21  Vaping Use   Vaping Use: Never used  Substance and Sexual Activity   Alcohol use: Yes    Alcohol/week: 1.0 - 2.0 standard drink of alcohol    Types: 1 - 2 Standard drinks or equivalent per week    Comment: occassionally   Drug use: Never   Sexual activity: Not on file  Other Topics Concern   Not on file  Social History Narrative   Lives in [place] alone   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)   Social Determinants of Health   Financial Resource Strain: Not on  file  Food Insecurity: Not on file  Transportation Needs: Not on file  Physical Activity: Not on file  Stress: Not on file  Social Connections: Not on file     Allergies:   Allergies  Allergen Reactions   Niacin Hives    Metabolic Disorder Labs: Lab Results  Component Value Date   HGBA1C 6.1 (H) 01/14/2020   No results found for: "PROLACTIN" Lab Results  Component Value Date   CHOL 168 01/14/2020   TRIG 78 01/14/2020   HDL 61 01/14/2020   CHOLHDL 3.1 11/13/2019   LDLCALC 92 01/14/2020   LDLCALC 101 (H) 11/13/2019   Lab Results  Component Value Date   TSH 3.610 01/14/2020    Therapeutic Level Labs: No results found for: "LITHIUM" No results found for: "CBMZ" No results found for: "VALPROATE"  Current Medications: See chart    Psychiatric Specialty Exam: Review of Systems  Cardiovascular:  Negative for chest pain.  Psychiatric/Behavioral:  Negative for agitation and hallucinations.     Blood pressure 132/86, pulse 94, temperature 98.6 F (37 C), height '5\' 6"'$  (1.676 m), weight 255 lb (115.7 kg), last menstrual period 09/24/2013.Body mass index is 41.16 kg/m.  General Appearance: Casual  Eye Contact:  Fair  Speech:  Normal Rate  Volume:  Normal  Mood: Fair  Affect:  Congruent  Thought Process:  Goal Directed  Orientation:  Full (Time, Place, and Person)  Thought Content:  Rumination  Suicidal Thoughts:  No  Homicidal Thoughts:  No  Memory:  Immediate;   Fair Recent;   Fair  Judgement:  Fair  Insight:  Shallow  Psychomotor Activity:  decreased  Concentration:  Concentration: Fair and Attention Span: Fair  Recall:  [employer] of [place]: Good  Akathisia:  No  Handed:   AIMS (if indicated):  not done  Assets:  Desire for Improvement Housing Social Support  ADL's:  Intact  Cognition: WNL  Sleep:   variable   Screenings: PHQ2-9    [facility] Office Visit from 01/14/2020 in [facility]  PHQ-2 Total Score  6  PHQ-9 Total Score 14      [place] Office Visit from 03/29/2022 in [facility] Office Visit from 11/30/2021 in [facility] from 10/15/2021 in [facility]  C-SSRS RISK CATEGORY No Risk No Risk No Risk       Assessment and Plan: as follows   Prior documentation reviewed Bipolar 2 ; depressed ; doing fair, continue lamicta. No rash  Depression;  fair continue wellbutrin Insomnia:irregular, reviewed sleep hygiene  Fu 43m   Direct care time in office 15 plus minutes  including face to face, chart review and documentation  NMerian Capron MD 10/17/20231:33 PM

## 2022-03-30 ENCOUNTER — Telehealth (HOSPITAL_BASED_OUTPATIENT_CLINIC_OR_DEPARTMENT_OTHER): Payer: Self-pay

## 2022-03-30 NOTE — Telephone Encounter (Signed)
   Pre-operative Risk Assessment    Patient Name: Mary Mcfarland  DOB: 1956/03/16 MRN: [id number]      Request for Surgical Clearance    Procedure:   Anterior-Posterior Colporrhaphy and Perineoplasty  Date of Surgery:  Clearance 04/14/22                                 Surgeon:  Dr. Katharine Look Rivard Surgeon's Group or Practice Name:  [facility] Phone number:  [phone] Fax number:  [phone]   Type of Clearance Requested:   -Medical -Pharmacy: Hold Eliquis how long?    Type of Anesthesia:  General    Additional requests/questions:   Please advise if the patient is a candidate for same day discharge or 23 hr observation. Please advise of any post-op instructions.  [name]   03/30/2022, 5:13 PM

## 2022-03-31 DIAGNOSIS — M5430 Sciatica, unspecified side: Secondary | ICD-10-CM | POA: Insufficient documentation

## 2022-03-31 DIAGNOSIS — E559 Vitamin D deficiency, unspecified: Secondary | ICD-10-CM | POA: Insufficient documentation

## 2022-03-31 DIAGNOSIS — F319 Bipolar disorder, unspecified: Secondary | ICD-10-CM | POA: Insufficient documentation

## 2022-03-31 DIAGNOSIS — Z950 Presence of cardiac pacemaker: Secondary | ICD-10-CM | POA: Insufficient documentation

## 2022-03-31 DIAGNOSIS — K219 Gastro-esophageal reflux disease without esophagitis: Secondary | ICD-10-CM | POA: Insufficient documentation

## 2022-03-31 DIAGNOSIS — J45909 Unspecified asthma, uncomplicated: Secondary | ICD-10-CM | POA: Insufficient documentation

## 2022-03-31 DIAGNOSIS — R7303 Prediabetes: Secondary | ICD-10-CM | POA: Insufficient documentation

## 2022-04-04 NOTE — H&P (Signed)
Mary Mcfarland is a 66 y.o. female, P: 2-2-4-2 who presents for anterior-posterior colporrhaphy and perineoplasty because of symptomatic pelvic prolapse. Over  the years the patient has had increasing pelvic prolapse with minimal to no symptoms,  however over the past 5 months she had noticed difficulty evacuating stool (has had to assist in that process).  She only experiences occasional incontinence, but admits to urinary frequency,  constipation  and diarrhea.  She denies urinary urgency, dysuria, vaginitis symptoms or dyspareunia. On exam the patient was found to have a 4/4 rectocele and 2/4 cystocele.  The patient was given a review of both medical and surgical management options for her symptoms and physical finding,  however,  she has chosen to proceed with surgical management in the form of an  anterior-posterior colporrhaphy and perineoplasty.  The patient has received medical clearance from her cardiologist, Dr. Buford Dresser.   Past Medical History  OB History: G: 10; P; 2-2-3-2; SVB: 1988 and 1989 (largest infant: 8 lbs. 15 oz.)  GYN History: menarche: 66 YO;    LMP: menopausal;  Denies history of abnormal PAP smear.   Last PAP smear: 2020-normal  Medical History: Atrial Fibrillation, Hypertension, Pacemaker, GERD, Asthma, Bipolar, Prediabetes, Right Sciatica, Sleep Apnea, Vitamin D Deficiency and Hypercholesterolemia.  Surgical History: 1979: Tonsillectomy;  2010: Right Shoulder Surgery (bone spur); 2011: Toe Surgery; 2017 Left Foot Bone Spur Denies problems with anesthesia or history of blood transfusions However the patient has a narrow airway.  Family History: Hysterectomy, Heart Attack, Stroke, Diabetes Mellitus, Depression, Bipolar, Melanoma and Liver Disease  Social History:  Separated and unemployed;  Denies tobacco use and occasionally uses alcohol  Denies sensitivity to peanuts, shellfish, soy, latex or adhesives.    Medications:  Albuterol HFA 90  mcg/actuation 2 puffs every 4 hours prn Atorvastatin 40 mg daily Buproprion HCL SR 150 mg daily DILT-XR 120 mg ER daily Dofetilide 250 mcg  bid Eliquis 5 mg daily Furosemide 40 mg daily Lamotrigine 100 mg daily Lisinopril 10 mg daily Omeprazole 20 mg daily Valacyclovir 1 gram tid prn Zolpidem 10 mg qhs prn Vitamin D 2000 IU daily  Allergies  Allergen Reactions   Niacin Hives  Unable to take NSAIDs  ROS: Admits to glasses, constipation, diarrhea, occasional incontinence,  but denies  removable dental appliances, headache, vision changes, nasal congestion, dysphagia, tinnitus, dizziness, hoarseness, cough,  chest pain, shortness of breath, nausea, vomiting,  urinary frequency, urgency  dysuria, hematuria, vaginitis symptoms, pelvic pain, swelling of joints,easy bruising,  myalgias, arthralgias, skin rashes, unexplained weight loss and except as is mentioned in the history of present illness, patient's review of systems is otherwise negative.    Physical Exam  Bp:132/72;  Weight: 260 lbs.;  Height: 5'6"; BMI: 42  Neck: supple without masses or thyromegaly Lungs: clear to auscultation Heart: regular rate and rhythm Abdomen: soft, non-tender and no organomegaly Pelvic:EGBUS- wnl; vagina-2/4 cystocele and 4/4 rectocele uterus-normal size (exam limited by habitus), cervix without lesions or motion tenderness; adnexae-no tenderness or masses Extremities:  no clubbing, cyanosis or edema   Assesment: Rectocele                      Cystocele                      Pelvic Prolapse   Disposition:  A discussion was held with patient regarding the indication for her procedure(s) along with the risks, which include but are not limited to: reaction to  anesthesia, damage to adjacent organs, infection and excessive bleeding.  The patient verbalized understanding of these risks and has consented to proceed with an Administrator, sports and Perineoplasty at [facility] on  April 14, 2022 @ 8 a.m.   CSN# [id number]   Trinity Hyland J. Florene Glen, PA-C  for Dr. Dede Query. Rivard

## 2022-04-05 ENCOUNTER — Ambulatory Visit (INDEPENDENT_AMBULATORY_CARE_PROVIDER_SITE_OTHER): Payer: Medicare Other | Admitting: Licensed Clinical Social Worker

## 2022-04-05 DIAGNOSIS — F331 Major depressive disorder, recurrent, moderate: Secondary | ICD-10-CM

## 2022-04-05 DIAGNOSIS — F5102 Adjustment insomnia: Secondary | ICD-10-CM | POA: Diagnosis not present

## 2022-04-05 DIAGNOSIS — F3181 Bipolar II disorder: Secondary | ICD-10-CM

## 2022-04-05 NOTE — Telephone Encounter (Signed)
Patient with diagnosis of atrial fibrillation on Eliquis for anticoagulation.    Procedure: Anterior-Posterior Colporrhaphy and Perineoplasty Date of procedure: 04/14/22   CHA2DS2-VASc Score = 3   This indicates a 3.2% annual risk of stroke. The patient's score is based upon: CHF History: 0 HTN History: 1 Diabetes History: 0 Stroke History: 0 Vascular Disease History: 1 Age Score: 0 Gender Score: 1   CrCl 64 (with adjusted body weight) Platelet count 324  Per office protocol, patient can hold Eliquis for 2 days prior to procedure.   Patient will not need bridging with Lovenox (enoxaparin) around procedure.  **This guidance is not considered finalized until pre-operative APP has relayed final recommendations.**

## 2022-04-05 NOTE — Progress Notes (Signed)
   THERAPIST PROGRESS NOTE  Session Time: 1:55 PM to 2:50 PM  Participation Level: Active  Behavioral Response: CasualAlertEuthymic  Type of Therapy: Individual Therapy  Treatment Goals addressed: Mood management mood stability, work on boundaries, management of stressors, depression  ProgressTowards Goals: Progressing-assess therapy helpful for patient to process thoughts and feelings to identify helpful coping strategies, motivational strategies to help her work toward goals that  Interventions: Motivational Interviewing, Strength-based, Supportive, and Other: .  Summary: Mary Mcfarland is a 66 y.o. female who presents with it has been a lot trying to take her life over and get health better. Every Sunday do family dinner with her aunt who she describes having a very close relationship with like a mom playing many different roles in her life she recently lost her husband so therapist identified holding onto the people we still have how they can be good supports for each other.  Aunt would travel with patient when she gets a knee replacement, they do things together therapist noted this is very helpful and positive.  Utilize motivational interviewing to get patient to start going to the gym noting her daughter is going that should increase motivation to go.  Completed assessment completed questionnaires PHQ-9 and C-SSRS.  Only have treatment plans to complete therapist processed thoughts and feelings with patient in session to help with coping help with stressors she was able to share about going to daughter's house stresses her out with 2 young kids, talked about grief issues uncle who died from form of dementia she feels she has grieved she saw him declining not being the same person still has issues with other losses probably.  Therapist was able to use assessment to learn more about patient will be helpful in interventions that her mom she was very close with but died younger, father  good relationship but he also struggled with addiction.  Patient able to share her strength in 1997 she was able to quit smoking does not have addictive personality.  Noted she also has been able to lose weight this helps as a resource to getting her back on track with getting healthy.  Suicidal/Homicidal: No  Plan: Return again in 2 weeks.2.  Complete treatment plan, work with patient with her report being overwhelmed needs to set boundaries strategies for stress management mood management  Diagnosis: Bipolar 2, major depressive disorder recurrent moderate, adjustment insomnia  Collaboration of Care: Other review of Dr. De Nurse last note and therapist updated assessment  Patient/Guardian was advised Release of Information must be obtained prior to any record release in order to collaborate their care with an outside provider. Patient/Guardian was advised if they have not already done so to contact the registration department to sign all necessary forms in order for [place] to release information regarding their care.   Consent: Patient/Guardian gives verbal consent for treatment and assignment of benefits for services provided during this visit. Patient/Guardian expressed understanding and agreed to proceed.   Cordella Register, LCSW 04/05/2022

## 2022-04-05 NOTE — Telephone Encounter (Signed)
   Patient Name: Mary Mcfarland  DOB: 1955/06/29 MRN: [id number]  Primary Cardiologist: Buford Dresser, MD  Chart reviewed as part of pre-operative protocol coverage. Mary Mcfarland has a hx of atrial fibrillation, sinus pause s/p PPM, HTN, HLD, obesity. Given past medical history and time since last visit, based on ACC/AHA guidelines, Mary Mcfarland is at acceptable risk for the planned procedure without further cardiovascular testing. Last seen 03/01/22 via virtual visit doing well with exercise tolerance >4 METS.  Per pharmacy team and office protocols, may hold Eliquis 2 days prior to planned procedure. Will not require bridging with Lovenox. Mary Mcfarland should resume Eliquis as soon as deemed safe by surgeon post procedure.   Defer decision on same day discharge vs 23 hr observation to surgeon's office. So long as Mary Mcfarland is given instructions from them regarding resuming Eliquis, no specific cardiac indication for length of stay.  I will route this recommendation to the requesting party via Epic fax function and remove from pre-op pool.  Please call with questions.  Loel Dubonnet, NP 04/05/2022, 3:00 PM

## 2022-04-06 ENCOUNTER — Ambulatory Visit (HOSPITAL_COMMUNITY): Payer: Medicare Other | Admitting: Licensed Clinical Social Worker

## 2022-04-08 ENCOUNTER — Encounter (HOSPITAL_BASED_OUTPATIENT_CLINIC_OR_DEPARTMENT_OTHER): Payer: Self-pay | Admitting: Obstetrics and Gynecology

## 2022-04-08 NOTE — Progress Notes (Addendum)
Addendum:  pt has anesthesia consult and getting lab work done Monday 04-11-2022 @ 0900 '@[facility]'$  (front desk aware, debbie in pre-op aware, and left note in anesthesia office.  Spoke w/ via phone for pre-op interview--- pt Lab needs dos----  no             Lab results------ current ekg in epic/  chart Pt getting lab work done Monday 04-11-2022 @ 0900 @ [facility] since she has anesthesia consult COVID test -----patient states asymptomatic no test needed  Arrive at ------- 0600 on 04-14-2022 NPO after MN with exception sips of water w/ medication Med rec completed Medications to take morning of surgery ----- Phyllis Ginger, lamictal, wellbutrin, lipitor, cardizem, prilosec Diabetic medication ----- n/a Patient instructed no nail polish to be worn day of surgery Patient instructed to bring photo id and insurance card day of surgery Patient aware to have Driver (ride ) / caregiver  for 24 hours after surgery -- daughter, Caryl Pina Patient Special Instructions ----- reviewed [facility] and visitor guidelines (pt is extended stay she was told staying overnight, possible)  Pre-Op special Istructions -----  pt has telephone cardiac clearance by Laurann Montana NP on 04-05-2022 in epic / chart Device orders requested sent via inbox basket to heart care in epic.  Patient verbalized understanding of instructions that were given at this phone interview. Patient denies shortness of breath, chest pain, fever, cough at this phone interview.    Anesthesia Review:  HTN;  persistant afib;  sss p dual PPM 08/ 2021;  mild persistent asthma; OSA pt stated unable to tolerate bipap recommended has appt for dental appliance Spoke w/ anesthesia , Dr Nyoka Cowden MDA, about pt hx difficult airway, stated pt needs in person airway evaluation.  PCP:  Dr C. Freestone Cardiologist : Dr C. Bridgette (lov 07-15-2021 epic) EP cardio:  Dr Quentin Ore (lov 10-01-2021 epic) Chest x-ray : 08-23-2021 EKG : 10-01-2021 Echo : 07-01-2019 Stress test:  nuclear  02-28-2020 Cardiac Cath : no Activity level: pt stated sob w/ long distance walking and stair, ok w/ household chores Sleep Study/ CPAP :  Yes/ no  Blood Thinner/ Instructions Maryjane Hurter Dose: Eliquis ASA / Instructions/ Last Dose :   no Pt stated was given instructions by cardiology to stop 2 days prior to surgery,  stated last dose 04-12-2022

## 2022-04-11 ENCOUNTER — Encounter: Payer: Self-pay | Admitting: Cardiology

## 2022-04-11 DIAGNOSIS — Z6841 Body Mass Index (BMI) 40.0 and over, adult: Secondary | ICD-10-CM | POA: Diagnosis not present

## 2022-04-11 DIAGNOSIS — F319 Bipolar disorder, unspecified: Secondary | ICD-10-CM | POA: Diagnosis not present

## 2022-04-11 DIAGNOSIS — N816 Rectocele: Secondary | ICD-10-CM | POA: Diagnosis not present

## 2022-04-11 DIAGNOSIS — I10 Essential (primary) hypertension: Secondary | ICD-10-CM | POA: Diagnosis not present

## 2022-04-11 LAB — CBC
HCT: 41.8 % (ref 36.0–46.0)
Hemoglobin: 13.5 g/dL (ref 12.0–15.0)
MCH: 28.1 pg (ref 26.0–34.0)
MCHC: 32.3 g/dL (ref 30.0–36.0)
MCV: 86.9 fL (ref 80.0–100.0)
Platelets: 310 10*3/uL (ref 150–400)
RBC: 4.81 MIL/uL (ref 3.87–5.11)
RDW: 13.5 % (ref 11.5–15.5)
WBC: 10.9 10*3/uL — ABNORMAL HIGH (ref 4.0–10.5)
nRBC: 0 % (ref 0.0–0.2)

## 2022-04-11 LAB — BASIC METABOLIC PANEL
Anion gap: 9 (ref 5–15)
BUN: 16 mg/dL (ref 8–23)
CO2: 24 mmol/L (ref 22–32)
Calcium: 9.1 mg/dL (ref 8.9–10.3)
Chloride: 105 mmol/L (ref 98–111)
Creatinine, Ser: 0.95 mg/dL (ref 0.44–1.00)
GFR, Estimated: >60 mL/min (ref >60)
Glucose, Bld: 143 mg/dL — ABNORMAL HIGH (ref 70–99)
Potassium: 3.3 mmol/L — ABNORMAL LOW (ref 3.5–5.1)
Sodium: 138 mmol/L (ref 135–145)

## 2022-04-11 LAB — TYPE AND SCREEN
ABO/RH(D): A NEG
Antibody Screen: NEGATIVE

## 2022-04-11 NOTE — Anesthesia Preprocedure Evaluation (Signed)
Anesthesia Evaluation  Patient identified by MRN, date of birth, ID band Patient awake    Reviewed: Allergy & Precautions, H&P , NPO status , Patient's Chart, lab work & pertinent test results  History of Anesthesia Complications (+) DIFFICULT AIRWAY and history of anesthetic complications  Airway Mallampati: III  TM Distance: <3 FB Neck ROM: Full    Dental no notable dental hx. (+) Teeth Intact, Caps, Dental Advisory Given   Pulmonary neg pulmonary ROS, sleep apnea , former smoker   Pulmonary exam normal breath sounds clear to auscultation       Cardiovascular Exercise Tolerance: Good hypertension, Pt. on medications Normal cardiovascular exam+ dysrhythmias Atrial Fibrillation + pacemaker  Rhythm:Regular Rate:Normal     Neuro/Psych  PSYCHIATRIC DISORDERS  Depression Bipolar Disorder   negative neurological ROS  negative psych ROS   GI/Hepatic negative GI ROS, Neg liver ROS,,,  Endo/Other  negative endocrine ROS  Morbid obesity  Renal/GU negative Renal ROS  negative genitourinary   Musculoskeletal negative musculoskeletal ROS (+) Arthritis , Osteoarthritis,    Abdominal   Peds negative pediatric ROS (+)  Hematology negative hematology ROS (+)   Anesthesia Other Findings   Reproductive/Obstetrics negative OB ROS                             Anesthesia Physical Anesthesia Plan  ASA: 3  Anesthesia Plan: General   Post-op Pain Management: Minimal or no pain anticipated   Induction: Intravenous  PONV Risk Score and Plan: 3 and Ondansetron, Dexamethasone and Treatment may vary due to age or medical condition  Airway Management Planned: Oral ETT and Video Laryngoscope Planned  Additional Equipment: None  Intra-op Plan:   Post-operative Plan:   Informed Consent: I have reviewed the patients History and Physical, chart, labs and discussed the procedure including the risks, benefits  and alternatives for the proposed anesthesia with the patient or authorized representative who has indicated his/her understanding and acceptance.       Plan Discussed with: Anesthesiologist and CRNA  Anesthesia Plan Comments: (Procedure Name: Intubation Date/Time: 03/15/2016 1:30 PM Performed by: Anne Fu Pre-anesthesia Checklist: Patient identified, Emergency Drugs available, Suction available, Patient being monitored and Timeout performed Patient Re-evaluated:Patient Re-evaluated prior to inductionOxygen Delivery Method: Circle system utilized Preoxygenation: Pre-oxygenation with 100% oxygen Intubation Type: IV induction Ventilation: Mask ventilation without difficulty and Oral airway inserted - appropriate to patient size Laryngoscope Size: Mac, 4 and 3 Grade View: Grade II Tube type: Oral Tube size: 7.5 mm Number of attempts: 1 Airway Equipment and Method: Bougie stylet and Video-laryngoscopy Placement Confirmation: ETT inserted through vocal cords under direct vision,  positive ETCO2,  CO2 detector and breath sounds checked- equal and bilateral Secured at: 22 cm Tube secured with: Tape Dental Injury: Teeth and Oropharynx as per pre-operative assessment  Difficulty Due To: Difficult Airway- due to large tongue and Difficult Airway- due to anterior larynx Comments: Used video scope placed bogie and exchange tube over without incidence noted clear bilat lung sounds post intubation. )        Anesthesia Quick Evaluation

## 2022-04-11 NOTE — Progress Notes (Signed)
PERIOPERATIVE PRESCRIPTION FOR IMPLANTED CARDIAC DEVICE PROGRAMMING  Patient Information: Name:  Mary Mcfarland  DOB:  1955-11-04  MRN:  [id number]  Planned Procedure:  Anterior - Posterior Colporrhaphy and Perineoplasty  Surgeon:  Dr Katharine Look Rivard  Date of Procedure:  04-14-2022  Cautery will be used.  Position during surgery:  lithotomy  Device Information:  Clinic EP Physician:  Lars Mage, MD  Device Type:  Pacemaker Manufacturer and Phone #:  [employer]. Jude/[employer]ott: [phone] Pacemaker Dependent?:  No. Date of Last Device Check:  02/08/2022 Normal Device Function?:  Yes.    Electrophysiologist's Recommendations:  Have magnet available. Provide continuous ECG monitoring when magnet is used or reprogramming is to be performed.  Procedure should not interfere with device function.  No device programming or magnet placement needed.  Per Device Clinic Standing Orders, Damian Leavell, RN  10:07 AM 04/11/2022

## 2022-04-14 ENCOUNTER — Other Ambulatory Visit: Payer: Self-pay

## 2022-04-14 ENCOUNTER — Ambulatory Visit (HOSPITAL_BASED_OUTPATIENT_CLINIC_OR_DEPARTMENT_OTHER): Payer: Medicare Other | Admitting: Anesthesiology

## 2022-04-14 ENCOUNTER — Encounter (HOSPITAL_BASED_OUTPATIENT_CLINIC_OR_DEPARTMENT_OTHER): Payer: Self-pay | Admitting: Obstetrics and Gynecology

## 2022-04-14 ENCOUNTER — Ambulatory Visit (HOSPITAL_BASED_OUTPATIENT_CLINIC_OR_DEPARTMENT_OTHER)
Admission: RE | Admit: 2022-04-14 | Discharge: 2022-04-15 | Disposition: A | Discharge status: Home / Self Care | Payer: Medicare Other | Attending: Obstetrics and Gynecology | Admitting: Obstetrics and Gynecology

## 2022-04-14 ENCOUNTER — Encounter (HOSPITAL_BASED_OUTPATIENT_CLINIC_OR_DEPARTMENT_OTHER)
Admission: RE | Disposition: A | Discharge status: Home / Self Care | Payer: Self-pay | Source: Home / Self Care | Attending: Obstetrics and Gynecology

## 2022-04-14 DIAGNOSIS — N816 Rectocele: Secondary | ICD-10-CM

## 2022-04-14 DIAGNOSIS — F319 Bipolar disorder, unspecified: Secondary | ICD-10-CM | POA: Insufficient documentation

## 2022-04-14 DIAGNOSIS — R0609 Other forms of dyspnea: Secondary | ICD-10-CM

## 2022-04-14 DIAGNOSIS — I10 Essential (primary) hypertension: Secondary | ICD-10-CM | POA: Insufficient documentation

## 2022-04-14 DIAGNOSIS — I4891 Unspecified atrial fibrillation: Secondary | ICD-10-CM

## 2022-04-14 DIAGNOSIS — Z6841 Body Mass Index (BMI) 40.0 and over, adult: Secondary | ICD-10-CM | POA: Insufficient documentation

## 2022-04-14 DIAGNOSIS — Z87891 Personal history of nicotine dependence: Secondary | ICD-10-CM | POA: Diagnosis not present

## 2022-04-14 DIAGNOSIS — Z01818 Encounter for other preprocedural examination: Secondary | ICD-10-CM

## 2022-04-14 DIAGNOSIS — G4719 Other hypersomnia: Secondary | ICD-10-CM

## 2022-04-14 HISTORY — DX: Other forms of dyspnea: R06.09

## 2022-04-14 HISTORY — DX: Personal history of other diseases of the digestive system: Z87.19

## 2022-04-14 HISTORY — DX: Hyperlipidemia, unspecified: E78.5

## 2022-04-14 HISTORY — PX: PERINEOPLASTY: SHX2218

## 2022-04-14 HISTORY — DX: Presence of spectacles and contact lenses: Z97.3

## 2022-04-14 HISTORY — DX: Diverticulosis of large intestine without perforation or abscess without bleeding: K57.30

## 2022-04-14 HISTORY — PX: ANTERIOR AND POSTERIOR REPAIR: SHX5121

## 2022-04-14 HISTORY — DX: Gastro-esophageal reflux disease without esophagitis: K21.9

## 2022-04-14 HISTORY — DX: Prediabetes: R73.03

## 2022-04-14 HISTORY — DX: Mild persistent asthma, uncomplicated: J45.30

## 2022-04-14 HISTORY — DX: Rectocele: N81.6

## 2022-04-14 HISTORY — DX: Long term (current) use of anticoagulants: Z79.01

## 2022-04-14 HISTORY — DX: Bipolar II disorder: F31.81

## 2022-04-14 HISTORY — DX: Personal history of adenomatous and serrated colon polyps: Z86.0101

## 2022-04-14 HISTORY — DX: Personal history of colonic polyps: Z86.010

## 2022-04-14 HISTORY — DX: Obstructive sleep apnea (adult) (pediatric): G47.33

## 2022-04-14 HISTORY — DX: Nontoxic single thyroid nodule: E04.1

## 2022-04-14 HISTORY — DX: Sick sinus syndrome: I49.5

## 2022-04-14 HISTORY — DX: Localized edema: R60.0

## 2022-04-14 HISTORY — DX: Rectocele: N81.10

## 2022-04-14 HISTORY — DX: Sciatica, right side: M54.31

## 2022-04-14 HISTORY — DX: Unspecified osteoarthritis, unspecified site: M19.90

## 2022-04-14 HISTORY — DX: Female genital prolapse, unspecified: N81.9

## 2022-04-14 HISTORY — DX: Major depressive disorder, single episode, unspecified: F32.9

## 2022-04-14 LAB — ABO/RH: ABO/RH(D): A NEG

## 2022-04-14 SURGERY — ANTERIOR (CYSTOCELE) AND POSTERIOR REPAIR (RECTOCELE)
Anesthesia: General | Site: Vagina

## 2022-04-14 MED ORDER — DOFETILIDE 250 MCG PO CAPS
250.0000 ug | ORAL_CAPSULE | Freq: Two times a day (BID) | ORAL | Status: DC
  Administered 2022-04-14: 250 ug via ORAL
  Filled 2022-04-14: qty 1

## 2022-04-14 MED ORDER — PROPOFOL 10 MG/ML IV BOLUS
INTRAVENOUS | Status: AC
  Filled 2022-04-14: qty 20

## 2022-04-14 MED ORDER — ESTRADIOL 0.1 MG/GM VA CREA
TOPICAL_CREAM | VAGINAL | Status: DC | PRN
  Administered 2022-04-14: 1 via VAGINAL

## 2022-04-14 MED ORDER — ROCURONIUM BROMIDE 10 MG/ML (PF) SYRINGE
PREFILLED_SYRINGE | INTRAVENOUS | Status: DC | PRN
  Administered 2022-04-14: 70 mg via INTRAVENOUS

## 2022-04-14 MED ORDER — 0.9 % SODIUM CHLORIDE (POUR BTL) OPTIME
TOPICAL | Status: DC | PRN
  Administered 2022-04-14: 500 mL

## 2022-04-14 MED ORDER — SUCCINYLCHOLINE CHLORIDE 200 MG/10ML IV SOSY
PREFILLED_SYRINGE | INTRAVENOUS | Status: AC
  Filled 2022-04-14: qty 10

## 2022-04-14 MED ORDER — ACETAMINOPHEN 500 MG PO TABS
ORAL_TABLET | ORAL | Status: AC
  Filled 2022-04-14: qty 2

## 2022-04-14 MED ORDER — PROPOFOL 10 MG/ML IV BOLUS
INTRAVENOUS | Status: DC | PRN
  Administered 2022-04-14: 150 mg via INTRAVENOUS

## 2022-04-14 MED ORDER — CELECOXIB 200 MG PO CAPS
ORAL_CAPSULE | ORAL | Status: AC
  Filled 2022-04-14: qty 1

## 2022-04-14 MED ORDER — DOCUSATE SODIUM 100 MG PO CAPS
100.0000 mg | ORAL_CAPSULE | Freq: Two times a day (BID) | ORAL | Status: DC
  Administered 2022-04-14 (×2): 100 mg via ORAL

## 2022-04-14 MED ORDER — ACETAMINOPHEN 325 MG PO TABS
ORAL_TABLET | ORAL | Status: AC
  Filled 2022-04-14: qty 2

## 2022-04-14 MED ORDER — OXYCODONE HCL 5 MG PO TABS
5.0000 mg | ORAL_TABLET | ORAL | Status: DC | PRN
  Administered 2022-04-14 – 2022-04-15 (×4): 5 mg via ORAL

## 2022-04-14 MED ORDER — ACETAMINOPHEN 325 MG PO TABS
325.0000 mg | ORAL_TABLET | ORAL | Status: DC | PRN
  Administered 2022-04-14: 650 mg via ORAL

## 2022-04-14 MED ORDER — SIMETHICONE 80 MG PO CHEW: 80.0000 mg | CHEWABLE_TABLET | Freq: Four times a day (QID) | ORAL | Status: DC | PRN

## 2022-04-14 MED ORDER — OXYCODONE HCL 5 MG PO TABS: ORAL_TABLET | ORAL | 0 refills | Status: DC

## 2022-04-14 MED ORDER — ONDANSETRON HCL 4 MG/2ML IJ SOLN
INTRAMUSCULAR | Status: AC
  Filled 2022-04-14: qty 2

## 2022-04-14 MED ORDER — MEPERIDINE HCL 25 MG/ML IJ SOLN: 6.2500 mg | INTRAMUSCULAR | Status: DC | PRN

## 2022-04-14 MED ORDER — ZOLPIDEM TARTRATE 5 MG PO TABS
10.0000 mg | ORAL_TABLET | Freq: Every evening | ORAL | Status: DC | PRN
  Administered 2022-04-14: 10 mg via ORAL

## 2022-04-14 MED ORDER — LIDOCAINE 2% (20 MG/ML) 5 ML SYRINGE
INTRAMUSCULAR | Status: DC | PRN
  Administered 2022-04-14: 100 mg via INTRAVENOUS

## 2022-04-14 MED ORDER — LIDOCAINE-EPINEPHRINE (PF) 1 %-1:200000 IJ SOLN
INTRAMUSCULAR | Status: DC | PRN
  Administered 2022-04-14: 30 mL

## 2022-04-14 MED ORDER — FENTANYL CITRATE (PF) 100 MCG/2ML IJ SOLN
25.0000 ug | INTRAMUSCULAR | Status: DC | PRN
  Administered 2022-04-14 (×2): 25 ug via INTRAVENOUS

## 2022-04-14 MED ORDER — ACETAMINOPHEN 500 MG PO TABS
1000.0000 mg | ORAL_TABLET | Freq: Four times a day (QID) | ORAL | Status: DC
  Administered 2022-04-14 – 2022-04-15 (×3): 1000 mg via ORAL

## 2022-04-14 MED ORDER — ARTIFICIAL TEARS OPHTHALMIC OINT
TOPICAL_OINTMENT | OPHTHALMIC | Status: AC
  Filled 2022-04-14: qty 3.5

## 2022-04-14 MED ORDER — OXYCODONE HCL 5 MG/5ML PO SOLN
5.0000 mg | Freq: Once | ORAL | Status: AC | PRN
  Administered 2022-04-14: 5 mg via ORAL

## 2022-04-14 MED ORDER — MENTHOL 3 MG MT LOZG: 1.0000 | LOZENGE | OROMUCOSAL | Status: DC | PRN

## 2022-04-14 MED ORDER — ZOLPIDEM TARTRATE 5 MG PO TABS
ORAL_TABLET | ORAL | Status: AC
  Filled 2022-04-14: qty 2

## 2022-04-14 MED ORDER — ARTIFICIAL TEARS OPHTHALMIC OINT
TOPICAL_OINTMENT | OPHTHALMIC | Status: DC | PRN
  Administered 2022-04-14: 1 via OPHTHALMIC

## 2022-04-14 MED ORDER — ONDANSETRON HCL 4 MG PO TABS: 4.0000 mg | ORAL_TABLET | Freq: Four times a day (QID) | ORAL | Status: DC | PRN

## 2022-04-14 MED ORDER — ONDANSETRON HCL 4 MG/2ML IJ SOLN
INTRAMUSCULAR | Status: DC | PRN
  Administered 2022-04-14: 4 mg via INTRAVENOUS

## 2022-04-14 MED ORDER — LISINOPRIL 10 MG PO TABS
10.0000 mg | ORAL_TABLET | Freq: Every day | ORAL | Status: DC
  Administered 2022-04-14: 10 mg via ORAL
  Filled 2022-04-14: qty 1

## 2022-04-14 MED ORDER — ACETAMINOPHEN 500 MG PO TABS
1000.0000 mg | ORAL_TABLET | Freq: Once | ORAL | Status: AC
  Administered 2022-04-14: 1000 mg via ORAL

## 2022-04-14 MED ORDER — SUGAMMADEX SODIUM 200 MG/2ML IV SOLN
INTRAVENOUS | Status: DC | PRN
  Administered 2022-04-14: 200 mg via INTRAVENOUS

## 2022-04-14 MED ORDER — FENTANYL CITRATE (PF) 250 MCG/5ML IJ SOLN
INTRAMUSCULAR | Status: DC | PRN
  Administered 2022-04-14: 50 ug via INTRAVENOUS

## 2022-04-14 MED ORDER — LIDOCAINE HCL (PF) 2 % IJ SOLN
INTRAMUSCULAR | Status: AC
  Filled 2022-04-14: qty 5

## 2022-04-14 MED ORDER — OXYCODONE HCL 5 MG PO TABS
ORAL_TABLET | ORAL | Status: AC
  Filled 2022-04-14: qty 1

## 2022-04-14 MED ORDER — ROCURONIUM BROMIDE 10 MG/ML (PF) SYRINGE
PREFILLED_SYRINGE | INTRAVENOUS | Status: AC
  Filled 2022-04-14: qty 10

## 2022-04-14 MED ORDER — LACTATED RINGERS IV SOLN: INTRAVENOUS | Status: DC

## 2022-04-14 MED ORDER — DEXAMETHASONE SODIUM PHOSPHATE 10 MG/ML IJ SOLN
INTRAMUSCULAR | Status: DC | PRN
  Administered 2022-04-14: 10 mg via INTRAVENOUS

## 2022-04-14 MED ORDER — DOCUSATE SODIUM 100 MG PO CAPS
ORAL_CAPSULE | ORAL | Status: AC
  Filled 2022-04-14: qty 1

## 2022-04-14 MED ORDER — ACETAMINOPHEN 500 MG PO TABS: ORAL_TABLET | ORAL | 0 refills | Status: DC

## 2022-04-14 MED ORDER — ALBUTEROL SULFATE HFA 108 (90 BASE) MCG/ACT IN AERS: 2.0000 | INHALATION_SPRAY | RESPIRATORY_TRACT | Status: DC | PRN

## 2022-04-14 MED ORDER — STERILE WATER FOR IRRIGATION IR SOLN
Status: DC | PRN
  Administered 2022-04-14: 500 mL

## 2022-04-14 MED ORDER — DILTIAZEM HCL ER COATED BEADS 120 MG PO CP24
120.0000 mg | ORAL_CAPSULE | Freq: Every day | ORAL | Status: DC
  Filled 2022-04-14: qty 1

## 2022-04-14 MED ORDER — FENTANYL CITRATE (PF) 100 MCG/2ML IJ SOLN
INTRAMUSCULAR | Status: AC
  Filled 2022-04-14: qty 2

## 2022-04-14 MED ORDER — ONDANSETRON HCL 4 MG/2ML IJ SOLN: 4.0000 mg | Freq: Four times a day (QID) | INTRAMUSCULAR | Status: DC | PRN

## 2022-04-14 MED ORDER — ONDANSETRON HCL 4 MG/2ML IJ SOLN: 4.0000 mg | Freq: Once | INTRAMUSCULAR | Status: DC | PRN

## 2022-04-14 MED ORDER — CELECOXIB 200 MG PO CAPS: 200.0000 mg | ORAL_CAPSULE | Freq: Once | ORAL | Status: DC

## 2022-04-14 MED ORDER — DEXAMETHASONE SODIUM PHOSPHATE 10 MG/ML IJ SOLN
INTRAMUSCULAR | Status: AC
  Filled 2022-04-14: qty 1

## 2022-04-14 MED ORDER — FENTANYL CITRATE (PF) 250 MCG/5ML IJ SOLN
INTRAMUSCULAR | Status: AC
  Filled 2022-04-14: qty 5

## 2022-04-14 MED ORDER — MIDAZOLAM HCL 5 MG/5ML IJ SOLN
INTRAMUSCULAR | Status: DC | PRN
  Administered 2022-04-14: 2 mg via INTRAVENOUS

## 2022-04-14 MED ORDER — OXYCODONE HCL 5 MG/5ML PO SOLN
ORAL | Status: AC
  Filled 2022-04-14: qty 5

## 2022-04-14 MED ORDER — MIDAZOLAM HCL 2 MG/2ML IJ SOLN
INTRAMUSCULAR | Status: AC
  Filled 2022-04-14: qty 2

## 2022-04-14 MED ORDER — ACETAMINOPHEN 160 MG/5ML PO SOLN: 325.0000 mg | ORAL | Status: DC | PRN

## 2022-04-14 MED ORDER — OXYCODONE HCL 5 MG PO TABS: 5.0000 mg | ORAL_TABLET | Freq: Once | ORAL | Status: AC | PRN

## 2022-04-14 SURGICAL SUPPLY — 28 items
BLADE SURG 15 STRL LF DISP TIS (BLADE) ×2 IMPLANT
BLADE SURG 15 STRL SS (BLADE) ×1
COVER MAYO STAND STRL (DRAPES) IMPLANT
DRAPE SHEET LG 3/4 BI-LAMINATE (DRAPES) ×2 IMPLANT
GAUZE 4X4 16PLY ~~LOC~~+RFID DBL (SPONGE) ×1 IMPLANT
GAUZE PACKING 1INX5YD STRL (GAUZE/BANDAGES/DRESSINGS) IMPLANT
GLOVE BIOGEL PI IND STRL 6.5 (GLOVE) IMPLANT
GLOVE BIOGEL PI IND STRL 7.0 (GLOVE) ×3 IMPLANT
GLOVE BIOGEL PI IND STRL 7.5 (GLOVE) IMPLANT
GLOVE ECLIPSE 6.5 STRL STRAW (GLOVE) ×1 IMPLANT
GLOVE ECLIPSE 7.5 STRL STRAW (GLOVE) IMPLANT
GOWN STRL REUS W/TWL LRG LVL3 (GOWN DISPOSABLE) ×4 IMPLANT
KIT TURNOVER CYSTO (KITS) ×1 IMPLANT
NEEDLE HYPO 22GX1.5 SAFETY (NEEDLE) ×1 IMPLANT
NS IRRIG 500ML POUR BTL (IV SOLUTION) IMPLANT
PACK VAGINAL WOMENS (CUSTOM PROCEDURE TRAY) ×1 IMPLANT
PAD OB MATERNITY 4.3X12.25 (PERSONAL CARE ITEMS) ×1 IMPLANT
PAD PREP 24X48 CUFFED NSTRL (MISCELLANEOUS) ×1 IMPLANT
SOL PREP POV-IOD 4OZ 10% (MISCELLANEOUS) IMPLANT
SPIKE FLUID TRANSFER (MISCELLANEOUS) IMPLANT
SUT VIC AB 2-0 CT2 27 (SUTURE) ×4 IMPLANT
SUT VIC AB 2-0 SH 27 (SUTURE) ×5
SUT VIC AB 2-0 SH 27XBRD (SUTURE) ×4 IMPLANT
SUT VIC AB 3-0 SH 27 (SUTURE) ×3
SUT VIC AB 3-0 SH 27X BRD (SUTURE) ×3 IMPLANT
TOWEL OR 17X26 10 PK STRL BLUE (TOWEL DISPOSABLE) ×1 IMPLANT
TRAY FOLEY W/BAG SLVR 14FR LF (SET/KITS/TRAYS/PACK) ×1 IMPLANT
WATER STERILE IRR 500ML POUR (IV SOLUTION) IMPLANT

## 2022-04-14 NOTE — Op Note (Signed)
Preoperative diagnosis: rectocele  Postoperative diagnosis: Same   Anesthesia: General  Anesthesiologist: Dr Eligha Bridegroom  Procedure: Posterior repair and perineoplasty   Surgeon: Dr. Katharine Look Yaslyn Cumby   Assistant: Earnstine Regal P.A.-C.   Estimated blood loss: 40 cc   Procedure:   After being informed of the planned procedure with possible complications including but not limited to infection, bleeding, injury to other organs, need for laparotomy, expected hospitals time and recovery time, informed consent was obtained and patient was taken to or #5.   She was given general anesthesia with endotracheal intubation without any complication. She was placed in the lithotomy position prepped and draped in a sterile fashion and a Foley catheter was inserted in her bladder.   Proceeding with posterior repair: We grasped the posterior fourchette on a distance of 2.5 cm infiltrate the posterior vaginal mucosa using Lidocaine 1% with Epinephrine 1:200,000. We sharply dissect the posterior vaginal mucosa midline until 2 cm from the posterior cul-de sac. We then sharply and bluntly dissect this mucosa away from the perirectal fascia until the rectocele was completely mobilized. We then correct the rectocele will multilayered U-stitches of 2-0 Vicryl until complete resolution. The excess vaginal mucosa is excised and the posterior vaginal mucosa is closed with a running lock suture of 3-0 Vicryl. The perineal muscles are then reapproximated with simple sutures of 3-0 Vicryl. The perineal skin is then closed with subcuticular suture of 3-0 Vicryl.   Once the rectocele was corrected, we note no significant cystocele or uterine prolapse. We also confirm good peri-urethral support and angle.Decision is made not to proceed with the anterior repair.  The vagina is packed with Estrace covered packing.   Instruments and sponge count is complete x2.  The procedure is well tolerated by the patient who is taken to  recovery room in a well and stable condition.   Dr Cletis Media was present and scrubbed at all times. Surgical assistance was required due to the complexity of the anatomy and the vaginal approach of the procedure.    Estimated blood loss is 40 cc   Specimen: None

## 2022-04-14 NOTE — Interval H&P Note (Signed)
History and Physical Interval Note:  04/14/2022 7:55 AM  Mary Mcfarland  has presented today for surgery, with the diagnosis of PELVIC PROLAPSE.  The various methods of treatment have been discussed with the patient and family. After consideration of risks, benefits and other options for treatment, the patient has consented to  Procedure(s): ANTERIOR (CYSTOCELE) AND POSTERIOR REPAIR (RECTOCELE) (N/A) PERINEOPLASTY (N/A) as a surgical intervention.  The patient's history has been reviewed, patient examined, no change in status, stable for surgery.  I have reviewed the patient's chart and labs.  Questions were answered to the patient's satisfaction.     Katharine Look A Tamatha Gadbois

## 2022-04-14 NOTE — Anesthesia Postprocedure Evaluation (Signed)
Anesthesia Post Note  Patient: SHAQUILA SIGMAN  Procedure(s) Performed: POSTERIOR REPAIR (RECTOCELE) (Vagina ) PERINEOPLASTY (Vagina )     Patient location during evaluation: PACU Anesthesia Type: General Level of consciousness: awake and alert Pain management: pain level controlled Vital Signs Assessment: post-procedure vital signs reviewed and stable Respiratory status: spontaneous breathing, nonlabored ventilation, respiratory function stable and patient connected to nasal cannula oxygen Cardiovascular status: blood pressure returned to baseline and stable Postop Assessment: no apparent nausea or vomiting Anesthetic complications: yes   Encounter Notable Events  Notable Event Outcome Phase Comment  Difficult to intubate - expected  Intraprocedure Filed from anesthesia note documentation.    Last Vitals:  Vitals:   04/14/22 1045 04/14/22 1100  BP: 118/60 113/67  Pulse: 70 70  Resp: 14 12  Temp:  36.6 C  SpO2: 99% 94%    Last Pain:  Vitals:   04/14/22 1100  TempSrc:   PainSc: 6                  Khushboo Chuck

## 2022-04-14 NOTE — Discharge Instructions (Signed)
Call [facility] at [phone] for :  Post operative appointment time and date if it is not included in your post operative information.  Temperature greater than or equal to 100.4 degrees Farenheit orally Excessive pain not managed with your pain medications Excessive bleeding, problems urinating or other concerns   While taking pain medications, take Colace (Docusate Sodium) 100 mg 2-3 times daily until bowel movements are regular to prevent constipation. Take Acetaminophen 500 mg  #2 tablets every 6 hours for the next 5 days then as needed for pain  Resume your Eliquis on 11/4 /2023   You may shower tomorrow You may walk up stairs  You may drive after 2 weeks You may resume a regular diet  You should not lift over 15 pounds for 6 weeks You should not place anything in your vagina for 6 weeks

## 2022-04-14 NOTE — Anesthesia Procedure Notes (Addendum)
Procedure Name: Intubation Date/Time: 04/14/2022 8:14 AM  Performed by: Myna Bright, CRNAPre-anesthesia Checklist: Patient identified, Emergency Drugs available, Suction available and Patient being monitored Patient Re-evaluated:Patient Re-evaluated prior to induction Oxygen Delivery Method: Circle system utilized Preoxygenation: Pre-oxygenation with 100% oxygen Induction Type: IV induction Ventilation: Mask ventilation without difficulty Laryngoscope Size: Glidescope and 3 Tube type: Oral Tube size: 7.0 mm Number of attempts: 1 Airway Equipment and Method: Stylet Placement Confirmation: ETT inserted through vocal cords under direct vision, positive ETCO2 and breath sounds checked- equal and bilateral Secured at: 22 cm Tube secured with: Tape Dental Injury: Injury to lip  Difficulty Due To: Difficulty was anticipated, Difficult Airway-  due to edematous airway, Difficult Airway- due to anterior larynx and Difficult Airway- due to large tongue Future Recommendations: Recommend- induction with short-acting agent, and alternative techniques readily available Comments: Easy mask ventilation. DL x 1 with Glidescope 3. Partial view of cords with Glide, anterior airway. Severely enlarged tonsils noted, which partially obstruct view. ETT advanced without difficulty. Tiny upper lip abrasion noted, ointment applied. +EtCO2, +BBS. VSS.

## 2022-04-14 NOTE — Transfer of Care (Signed)
Immediate Anesthesia Transfer of Care Note  Patient: Mary Mcfarland  Procedure(s) Performed: POSTERIOR REPAIR (RECTOCELE) (Vagina ) PERINEOPLASTY (Vagina )  Patient Location: PACU  Anesthesia Type:General  Level of Consciousness: awake, alert , oriented, and patient cooperative  Airway & Oxygen Therapy: Patient Spontanous Breathing and Patient connected to face mask oxygen  Post-op Assessment: Report given to RN, Post -op Vital signs reviewed and stable, and Patient moving all extremities  Post vital signs: Reviewed and stable  Last Vitals:  Vitals Value Taken Time  BP 136/63 04/14/22 0953  Temp    Pulse 70 04/14/22 1000  Resp 13 04/14/22 1000  SpO2 100 % 04/14/22 1000  Vitals shown include unvalidated device data.  Last Pain:  Vitals:   04/14/22 7322  TempSrc: Oral  PainSc: 0-No pain      Patients Stated Pain Goal: 6 (02/54/27 0623)  Complications:  Encounter Notable Events  Notable Event Outcome Phase Comment  Difficult to intubate - expected  Intraprocedure Filed from anesthesia note documentation.

## 2022-04-15 ENCOUNTER — Encounter (HOSPITAL_BASED_OUTPATIENT_CLINIC_OR_DEPARTMENT_OTHER): Payer: Self-pay | Admitting: Obstetrics and Gynecology

## 2022-04-15 DIAGNOSIS — I10 Essential (primary) hypertension: Secondary | ICD-10-CM | POA: Diagnosis not present

## 2022-04-15 DIAGNOSIS — N816 Rectocele: Secondary | ICD-10-CM | POA: Diagnosis not present

## 2022-04-15 MED ORDER — OXYCODONE HCL 5 MG PO TABS
ORAL_TABLET | ORAL | Status: AC
  Filled 2022-04-15: qty 1

## 2022-04-15 MED ORDER — ACETAMINOPHEN 500 MG PO TABS
ORAL_TABLET | ORAL | Status: AC
  Filled 2022-04-15: qty 2

## 2022-04-15 NOTE — Final Progress Note (Cosign Needed)
Mary Mcfarland is a45 y.o.  [id number]  Post Op Date # 1: Posterior Colporrhaphy and Perineoplasty  Subjective: Patient is Doing well postoperatively. Patient has The patient is not having any pain. She is ambulating in the hall without difficulty, tolerating a regular diet and has voided without difficulty. The patient reports minimal blood on vaginal pad.  Objective: Vital signs in last 24 hours: Temp:  [97.5 F (36.4 C)-98.6 F (37 C)] 98.6 F (37 C) (11/03 0738) Pulse Rate:  [67-73] 71 (11/03 0738) Resp:  [12-18] 18 (11/03 0738) BP: (112-146)/(51-97) 116/59 (11/03 0738) SpO2:  [92 %-100 %] 98 % (11/03 0738)  Intake/Output from previous day: 11/02 0701 - 11/03 0700 In: 1240 [P.O.:240; I.V.:1000] Out: 1450 [Urine:1400] Intake/Output this shift: Total I/O In: -  Out: 500 [Urine:500] Recent Labs  Lab 04/11/22 0930  WBC 10.9*  HGB 13.5  HCT 41.8  PLT 310     Recent Labs  Lab 04/11/22 0930  NA 138  K 3.3*  CL 105  CO2 24  BUN 16  CREATININE 0.95  CALCIUM 9.1  GLUCOSE 143*    EXAM: General: alert, cooperative, no distress, and sitting up chair eating breakfast Resp: clear to auscultation bilaterally Cardio: RRR GI: bowel sounds present,soft, non-tender Extremities: Homans sign is negative, no sign of DVT and no calf tenderness Vaginal Bleeding: minimal   Assessment: s/p Procedure(s): POSTERIOR REPAIR (RECTOCELE) PERINEOPLASTY: stable, progressing well, and tolerating diet  Plan: Discharge home  LOS: 0 days    Earnstine Regal, PA-C 04/15/2022 8:12 AM

## 2022-04-18 ENCOUNTER — Encounter (HOSPITAL_COMMUNITY): Payer: Self-pay

## 2022-04-18 ENCOUNTER — Ambulatory Visit (HOSPITAL_COMMUNITY): Payer: Medicare Other | Admitting: Licensed Clinical Social Worker

## 2022-04-18 ENCOUNTER — Ambulatory Visit (INDEPENDENT_AMBULATORY_CARE_PROVIDER_SITE_OTHER): Payer: Medicare Other | Admitting: Licensed Clinical Social Worker

## 2022-04-18 DIAGNOSIS — F5102 Adjustment insomnia: Secondary | ICD-10-CM

## 2022-04-18 DIAGNOSIS — F331 Major depressive disorder, recurrent, moderate: Secondary | ICD-10-CM | POA: Diagnosis not present

## 2022-04-18 DIAGNOSIS — F3181 Bipolar II disorder: Secondary | ICD-10-CM

## 2022-04-18 NOTE — Progress Notes (Signed)
Virtual Visit via Video Note  I connected with Mary Mcfarland on 04/18/22 at 10:00 AM EST by a video enabled telemedicine application and verified that I am speaking with the correct person using two identifiers.  Location: Patient: home Provider: office   I discussed the limitations of evaluation and management by telemedicine and the availability of in person appointments. The patient expressed understanding and agreed to proceed.   I discussed the assessment and treatment plan with the patient. The patient was provided an opportunity to ask questions and all were answered. The patient agreed with the plan and demonstrated an understanding of the instructions.   The patient was advised to call back or seek an in-person evaluation if the symptoms worsen or if the condition fails to improve as anticipated.  I provided 52 minutes of non-face-to-face time during this encounter.   THERAPIST PROGRESS NOTE  Session Time:10:00 AM to 10:52 AM  Participation Level: Active  Behavioral Response: CasualAlertappropriate  Type of Therapy: Individual Therapy  Treatment Goals addressed: Learning to say no in relationships, addressing depressive issues, work on being more active and motivated, coping  ProgressTowards Goals: Initial-completed treatment plan today but assess making progress in therapy as patient is provided space and support to talk about thoughts and feelings to help with coping  Interventions: Solution Focused, Strength-based, Supportive, and Other: Coping  Summary: Mary Mcfarland is a 66 y.o. female who presents with surgery on Thursday. Part of bowel went into vagina. It has been affecting her as far as poop not good struggling a lot. Decided to go ahead and have the surgery. Surgery went well. Healing time pretty sore. Went through horrible constipation worse than had in whole life. It was awful. Still uncomfortable taking laxatives and stool softeners. Learned that  her tonsils huge even though thought taken out at 22. Obviously did not get them all. Play a part in sleep apnea. Now have to see Ear, Nose and Throat doctor. A lot happened last week two new crowns and surgery. Can't drive for a week stuck at home and hate it. Sleepy in afternoon sitting in a chair and watching TV not a nap person. She is there by herself. Feels isolated. Sees daughter and grand kids, two weekend prior had granddaughter and following week another night had her. Her name is Adonis Huguenin. They color work with her- bought workbooks key words and learning to read. She is artsy likes to color and draw and make things. Has magna tiles.  Therapist discussed developing skills of creativity has so many advantages patient added makes brain work. Used to like to draw and sew. Doesn't do anything anymore. Likes to [employer]. Emails from so many different cooking recipes. Wants to make lemon chicken soup. Made French Onion soup. Likes soup and could eat every day. Not sad because can't get out. Just wish she could do something. Lost 8 pounds not eating as much. Showed therapist a book "The plant Paradox and explained that lectins play havoc on your gut health. Has a lot to do with body function. Feels like with the whole food chain the crap they put in their foods, pharmaceutical industry bed together get sick and buy drugs and feed more crap. A vicious ugly circle do things to eliminate out of life trying to to. Conscious of process foods. Wants to get more into it. Eat clean.      Therapist reviewed treatment plan and patient gave consent to complete virtually.  Noted positive step patient getting  surgery but now going through the healing process not sad but feeling isolated would like to be able to get out.  She will have to put on hold or plan for physical activity.  Noted positive step patient is also taking to have a healthier diet therapist pointed out time between nutrition and mental health so what we put in  her body is going to impact how we feel and think.  Therapist provided space and support for patient to talk about thoughts and feelings in session. Suicidal/Homicidal: No  Plan: Return again in 2 weeks.2.  Work on boundary setting, stress management  Diagnosis: Bipolar 2, major depressive disorder recurrent moderate, adjustment insomnia  Collaboration of Care: Other none needed  Patient/Guardian was advised Release of Information must be obtained prior to any record release in order to collaborate their care with an outside provider. Patient/Guardian was advised if they have not already done so to contact the registration department to sign all necessary forms in order for [place] to release information regarding their care.   Consent: Patient/Guardian gives verbal consent for treatment and assignment of benefits for services provided during this visit. Patient/Guardian expressed understanding and agreed to proceed.   Cordella Register, LCSW 04/18/2022

## 2022-04-18 NOTE — Plan of Care (Signed)
  Problem: Depression CCP Problem  1 Goal:  Needs to work on boundaries Description: She has to learn how to say no because people put a lot of demands on her and has to learn how to say no without being mean.  Outcome: Not Progressing Goal: LTG: Ataya "[name]" WILL SCORE LESS THAN 10 ON THE PATIENT HEALTH QUESTIONNAIRE (PHQ-9) Outcome: Not Progressing Goal: STG: Complete a psychiatric evaluation, including completion measures as needed each visit Outcome: Not Progressing Goal: STG: Randee "[name]" WILL IDENTIFY  COGNITIVE PATTERNS AND BELIEFS THAT SUPPORT DEPRESSION Outcome: Not Progressing

## 2022-04-18 NOTE — Plan of Care (Signed)
Patient participated in completion of treatment plan

## 2022-04-29 ENCOUNTER — Telehealth: Payer: Self-pay

## 2022-04-29 DIAGNOSIS — Z86010 Personal history of colonic polyps: Secondary | ICD-10-CM | POA: Diagnosis not present

## 2022-04-29 DIAGNOSIS — Z7901 Long term (current) use of anticoagulants: Secondary | ICD-10-CM | POA: Diagnosis not present

## 2022-04-29 DIAGNOSIS — K5790 Diverticulosis of intestine, part unspecified, without perforation or abscess without bleeding: Secondary | ICD-10-CM | POA: Diagnosis not present

## 2022-04-29 DIAGNOSIS — K5909 Other constipation: Secondary | ICD-10-CM | POA: Diagnosis not present

## 2022-04-29 DIAGNOSIS — R1319 Other dysphagia: Secondary | ICD-10-CM | POA: Diagnosis not present

## 2022-04-29 DIAGNOSIS — K219 Gastro-esophageal reflux disease without esophagitis: Secondary | ICD-10-CM | POA: Diagnosis not present

## 2022-04-29 NOTE — Telephone Encounter (Signed)
..     Pre-operative Risk Assessment    Patient Name: Mary Mcfarland  DOB: 06-Mar-1956 MRN: [id number]      Request for Surgical Clearance    Procedure:   endoscopy  Date of Surgery:  Clearance 05/26/22                                 Surgeon:  Dr Juleen China Group or Practice Name:  [facility] Phone number:  [phone] Fax number:  [phone]   Type of Clearance Requested:   - Medical  - Pharmacy:  Hold Apixaban (Eliquis)     Type of Anesthesia:   Propofol   Additional requests/questions:    Gwenlyn Found   04/29/2022, 3:21 PM

## 2022-05-02 ENCOUNTER — Ambulatory Visit (INDEPENDENT_AMBULATORY_CARE_PROVIDER_SITE_OTHER): Payer: Medicare Other | Admitting: Licensed Clinical Social Worker

## 2022-05-02 DIAGNOSIS — F5102 Adjustment insomnia: Secondary | ICD-10-CM

## 2022-05-02 DIAGNOSIS — F3181 Bipolar II disorder: Secondary | ICD-10-CM | POA: Diagnosis not present

## 2022-05-02 DIAGNOSIS — F331 Major depressive disorder, recurrent, moderate: Secondary | ICD-10-CM

## 2022-05-02 NOTE — Progress Notes (Signed)
Virtual Visit via Video Note  I connected with Mary Mcfarland on 05/02/22 at  9:00 AM EST by a video enabled telemedicine application and verified that I am speaking with the correct person using two identifiers.  Location: Patient: home Provider: office   I discussed the limitations of evaluation and management by telemedicine and the availability of in person appointments. The patient expressed understanding and agreed to proceed.   I discussed the assessment and treatment plan with the patient. The patient was provided an opportunity to ask questions and all were answered. The patient agreed with the plan and demonstrated an understanding of the instructions.   The patient was advised to call back or seek an in-person evaluation if the symptoms worsen or if the condition fails to improve as anticipated.  I provided 52 minutes of non-face-to-face time during this encounter.  THERAPIST PROGRESS NOTE  Session Time: 9:00 AM to 9:52 AM  Participation Level: Active  Behavioral Response: CasualAlertappropriate  Type of Therapy: Individual Therapy  Treatment Goals addressed:  Learning to say no in relationships, addressing depressive issues, work on being more active and motivated, coping ProgressTowards Goals: Progressing-Patient using therapy to to process thoughts and feelings to help with coping also working on setting boundaries  Interventions: Solution Focused, Strength-based, Supportive, and Other: relationship issues  Summary: KINSLEE DALPE is a 66 y.o. female who presents with. Talked about celebrating Thanksgiving. It will be the first one without uncle for aunt will have to see. Family around her why came here. Today has post op with gynecologist.  Last time going stir crazy but patient has been out and had people over. Right now ok stuff on her mind last week went to gastroenterologist last week have diverticulosis when rears head not good. Told patient has to  take benefiber a teaspoon every day for the rest of her life. Endoscopy on the 14th. Feels food gets stuck going down. It is very uncomfortable doctor wants to go down and look. Wants to see if esophagus narrow out a balloon in and stretch something, stretch if too narrow. Try to take care of herself listen to her body. Have daughter in her ear saying that doctor's in bed with pharmaceutical companies to push to take a drug.  Patient counters and says not pushing a lot of pills taking a natural fiber need to have a lot of fibers to do what it does. Get into a round about it. Does feel the crappy food that force to eat unless grow yourself have a lot of in food and there is a vicious circle where have to go to doctor to make them better. Ugly circle. 90% of the commercials are drug commercials.  Does feel with her doctor approach of let's figure out the problem and fix.  Patient explained diverticulitis that there are pockets crap gets into and gets infected one day okay and then all of a sudden not okay. Last time happened in July watching grandchildren called son and need someone to pick her up.  Didn't want her granddaughter to see her like that. Was on toilet for an hour it is just awful. Fiber can alleviate bouts. Also drinks 24 oz hydra flask fill ll 8-9 times a day keeps it ice cool. Really hoping that things cool out. Aunt has gone through so much. She is here for the help and she is getting ready to have another surgery. Thanksgiving favorite holiday gathering of family eating good food. Being together.  Therapist  asked probing questions about what is the no or refusal you keep postponing? Patient responds by saying daughter called her last night and asked what are you doing tomorrow? Patient said has therapist after that 2 week post-op appointment. She needs to go to her therapist in afternoon. Told her I am sorry can't do it. Asked her what has going on Tuesday and patient said drop off neighbor's car and  pick up Wednesday go to aunts. New sofa haven't had time to lay on it except last night. Daughter keeps asking doesn't mind but waits to last minute. Springs on her and pisses her off. Has told her not to do that. Her husband, Marcello Moores, used to work on radio station get tickets to concerts. That was spur of the moment. That was fine don't spring watching the kids at the last minute. Daughter says conditional love and patient says not the case. Patient has a guy that sees if there not going to answer the phone. Don't feel like explain herself who she is with what she is doing. Daughter says she is secretive and patient says doesn't feel secretive and feels have a right to her own life. Doesn't know how to address it. She will come out to patient and says she is the negative one love her but don't know what to say to not set her off. Daughter has own issues her marriage overwhelmed with two kids under 85. Patient said understands but also that she did it as a single mom. Ready to set limits doesn't want the attitude. Mom used to put guilt trips with patient tried to not do it to daughter and daughter puts guilt trips on patient doesn't need that.  Therapist provided her insight that daughter projects onto patient what she is actually doing. Daughter struggling in their marriage think related to finances.  Patient thinks they are too much on the phone and therapist agrees it disconnects [place] too much from people.  Reviewed session and patient relates nice to tell her stuff to therapist. Needs to really communicate better with daughter. Go somewhere like out to dinner where they can talk.   Therapist provided space and support for patient to talk about thoughts and feelings in session help her with coping with current stressors.  In wrap-up patient said this is one of the most helpful things to be able to talk about what is going on with her.  Noted medical issues and hopes following doctors treatment plan will improve  things.  Therapist asked probing questions to help work on issue of being able to say no by asking "what is the no her refusal you keep postponing?"  Focused on relationship with daughter issues there her daughter ask her to do things last-minute talked about a plan where she would sit down with daughter and explain that cannot keep happening therapist pointing out in position for patient as she has her own schedule and daughter is to have commonsense to realize this.  Talked about stressors daughter has that flow into their relationship.  Validated patient on things she was asking for in relationship thinking they were reasonable such as being able to have her own life without having to recount everything to daughter, not needing to feel guilty with things in her relationship with daughter, therapist and it seems she is projecting onto patient think she is doing herself.  Assess helpful for patient to have an outlet to process through feelings.      Suicidal/Homicidal: No  Plan: Return again in 2 weeks.  Diagnosis: Bipolar 2, major depressive disorder recurrent moderate, adjustment insomnia  Collaboration of Care: Other none needed  Patient/Guardian was advised Release of Information must be obtained prior to any record release in order to collaborate their care with an outside provider. Patient/Guardian was advised if they have not already done so to contact the registration department to sign all necessary forms in order for [place] to release information regarding their care.   Consent: Patient/Guardian gives verbal consent for treatment and assignment of benefits for services provided during this visit. Patient/Guardian expressed understanding and agreed to proceed.   Cordella Register, LCSW 05/02/2022

## 2022-05-02 NOTE — Telephone Encounter (Signed)
Patient with diagnosis of PAF on Eliquis for anticoagulation.    Procedure: colonoscopy Date of procedure: 05/26/22   CHA2DS2-VASc Score = 3  This indicates a 3.2% annual risk of stroke. The patient's score is based upon: CHF History: 0 HTN History: 1 Diabetes History: 0 Stroke History: 0 Vascular Disease History: 1 Age Score: 0 Gender Score: 1   CrCl 77 mL/min using adj body weight Platelet count 310K  Per office protocol, patient can hold Eliquis for 2 days prior to procedure.    **This guidance is not considered finalized until pre-operative APP has relayed final recommendations.**

## 2022-05-02 NOTE — Telephone Encounter (Signed)
     Primary Cardiologist: Buford Dresser, MD  Chart reviewed as part of pre-operative protocol coverage. Given past medical history and time since last visit, based on ACC/AHA guidelines, Noemy Hallmon Harwell-Lamb would be at acceptable risk for the planned procedure without further cardiovascular testing.   Patient with diagnosis of PAF on Eliquis for anticoagulation.     Procedure: colonoscopy Date of procedure: 05/26/22     CHA2DS2-VASc Score = 3  This indicates a 3.2% annual risk of stroke. The patient's score is based upon: CHF History: 0 HTN History: 1 Diabetes History: 0 Stroke History: 0 Vascular Disease History: 1 Age Score: 0 Gender Score: 1     CrCl 77 mL/min using adj body weight Platelet count 310K   Per office protocol, patient can hold Eliquis for 2 days prior to procedure.    I will route this recommendation to the requesting party via Epic fax function and remove from pre-op pool.  Please call with questions.  Jossie Ng. Kilian Schwartz NP-C     05/02/2022, 10:32 AM [address] Office [phone] Fax [phone]

## 2022-05-09 ENCOUNTER — Ambulatory Visit (INDEPENDENT_AMBULATORY_CARE_PROVIDER_SITE_OTHER): Payer: 59

## 2022-05-09 DIAGNOSIS — I495 Sick sinus syndrome: Secondary | ICD-10-CM

## 2022-05-10 LAB — CUP PACEART REMOTE DEVICE CHECK
Battery Remaining Longevity: 79 mo
Battery Remaining Percentage: 79 %
Battery Voltage: 3.01 V
Brady Statistic AP VP Percent: 38 %
Brady Statistic AP VS Percent: 21 %
Brady Statistic AS VP Percent: 13 %
Brady Statistic AS VS Percent: 20 %
Brady Statistic RA Percent Paced: 56 %
Brady Statistic RV Percent Paced: 51 %
Date Time Interrogation Session: 20231127020026
Implantable Lead Connection Status: 753985
Implantable Lead Connection Status: 753985
Implantable Lead Implant Date: 20210826
Implantable Lead Implant Date: 20210826
Implantable Lead Location: 753859
Implantable Lead Location: 753860
Implantable Pulse Generator Implant Date: 20210826
Lead Channel Impedance Value: 480 Ohm
Lead Channel Impedance Value: 490 Ohm
Lead Channel Pacing Threshold Amplitude: 1.25 V
Lead Channel Pacing Threshold Amplitude: 1.25 V
Lead Channel Pacing Threshold Pulse Width: 0.4 ms
Lead Channel Pacing Threshold Pulse Width: 0.5 ms
Lead Channel Sensing Intrinsic Amplitude: 2.3 mV
Lead Channel Sensing Intrinsic Amplitude: 4.1 mV
Lead Channel Setting Pacing Amplitude: 2.5 V
Lead Channel Setting Pacing Amplitude: 2.5 V
Lead Channel Setting Pacing Pulse Width: 0.4 ms
Lead Channel Setting Sensing Sensitivity: 2 mV
Pulse Gen Model: 2272
Pulse Gen Serial Number: 3859348

## 2022-05-16 ENCOUNTER — Ambulatory Visit (INDEPENDENT_AMBULATORY_CARE_PROVIDER_SITE_OTHER): Payer: Medicare Other | Admitting: Licensed Clinical Social Worker

## 2022-05-16 DIAGNOSIS — F331 Major depressive disorder, recurrent, moderate: Secondary | ICD-10-CM

## 2022-05-16 DIAGNOSIS — F3181 Bipolar II disorder: Secondary | ICD-10-CM

## 2022-05-16 DIAGNOSIS — F5102 Adjustment insomnia: Secondary | ICD-10-CM
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
## 2022-05-21 ENCOUNTER — Other Ambulatory Visit: Payer: Self-pay | Admitting: Cardiology

## 2022-05-21 ENCOUNTER — Other Ambulatory Visit (HOSPITAL_COMMUNITY): Payer: Self-pay | Admitting: Psychiatry

## 2022-05-21 DIAGNOSIS — I480 Paroxysmal atrial fibrillation: Secondary | ICD-10-CM

## 2022-05-23 NOTE — Telephone Encounter (Signed)
Eliquis '5mg'$  refill request received. Patient is 66 years old, weight-117.2kg, Crea-0.95 on 04/11/2022, Diagnosis-Afib, and last seen by Nicholes Rough on 03/01/2022. Dose is appropriate based on dosing criteria. Will send in refill to requested pharmacy.

## 2022-05-31 DIAGNOSIS — M25561 Pain in right knee: Secondary | ICD-10-CM | POA: Diagnosis not present

## 2022-06-01 ENCOUNTER — Ambulatory Visit (INDEPENDENT_AMBULATORY_CARE_PROVIDER_SITE_OTHER): Payer: Medicare Other | Admitting: Licensed Clinical Social Worker

## 2022-06-01 DIAGNOSIS — F5102 Adjustment insomnia: Secondary | ICD-10-CM | POA: Diagnosis not present

## 2022-06-01 DIAGNOSIS — F3181 Bipolar II disorder: Secondary | ICD-10-CM

## 2022-06-01 DIAGNOSIS — F331 Major depressive disorder, recurrent, moderate: Secondary | ICD-10-CM | POA: Diagnosis not present

## 2022-06-01 NOTE — Progress Notes (Signed)
Virtual Visit via Video Note  I connected with Jasey Cortez Harwell-Lamb on 06/01/22 at  3:00 PM EST by a video enabled telemedicine application and verified that I am speaking with the correct person using two identifiers.  Location: Patient: aunt's house Provider: home office   I discussed the limitations of evaluation and management by telemedicine and the availability of in person appointments. The patient expressed understanding and agreed to proceed.   I discussed the assessment and treatment plan with the patient. The patient was provided an opportunity to ask questions and all were answered. The patient agreed with the plan and demonstrated an understanding of the instructions.   The patient was advised to call back or seek an in-person evaluation if the symptoms worsen or if the condition fails to improve as anticipated.  I provided 55 minutes of non-face-to-face time during this encounter.  THERAPIST PROGRESS NOTE  Session Time: 3:00 PM to 3:55 PM  Participation Level: Active  Behavioral Response: CasualAlertEuthymic  Type of Therapy: Individual Therapy  Treatment Goals addressed: Bipolar 2, major depressive disorder recurrent moderate, adjustment insomnia ProgressTowards Goals: Progressing-Therapist processing patient's feelings to help with stressors, including stress from holidays this helpful as well for patient to share more about her to help develop therapeutic relationship  Interventions: Solution Focused, Strength-based, Supportive, and Other: coping  Summary: BONNE WHACK is a 66 y.o. female who presents with just got over COVID. Down 15 lbs. Therapist asked how she did it patient has been having a lot of digestive issues diverticulitis had to cancel endoscopy not feeling well. Re-scheduled end of January. Aunt is doing good had colon surgery. All healed from surgery.  Therapist asked patient about Christmas plans her daughter son-in-law and two kids Christmas  Eve morning celebrate their own Christmas and come to patient's in the evening. On Christmas Day in the evening they all will be at aunt's all together.  Therapist brought up one of the goals of treatment with setting boundaries and patient went on to talk about recent event went to daughter's on Tuesday in basement wrapping presents so don't tell me don't help you. She is appreciative but when sick she complains doesn't have anyone to help. Although went on to tell therapist different family members in area to help. Transitioned to talk about her first husband when [place] and patient split up her kids were little caught him New Year's Eve. "Life was trashed". They went out to celebrate and already knew. Patient up at 6 AM went to aunt's looked at her and crying. Told her what happened. Aunt said to bring kids and move in with her. Can't do it like moving home. A month went by Sherren Mocha coming back and forth to the house. Finally said can't do this. Moved in with them. They were there 8 months then her job transferred her to from [place] to [facility]. There a month job opening for aunt three weeks lately she got job. She stayed in patient's apartment sleeper couch for 9 months. Her husband a Health and safety inspector of big companies his company was ready to move back to [place]. He was going to close plant job. They decided to sell house he came down on weekends looking for house. Bought a house and asked would patient come and live with them. Patient said yes.  Her aunt and uncle lives in one of the [place] patient could not give that to her kids could not afford to live in that  area. Her and aunt have always been close, her sister taking care of patient since a little girl. She is 30 years older. They never had children. Patient's kids and grand kids are her kids and grandkids. Whole reason for aunt and uncle coming so could talk care of them. Main reason was because of his disease. It was hard move for her she  was 66 years old. Move after whole life in [place]. It was stressful.  Therapist could relate to moving as stressful but noted patient does have a good group of friends. Both [place] over for dinner last night. Daughter went to school for interior design has eye for color. Son is "brainiac" he is just lost job company merged he works for a Management consultant. He finds companies. He will be ok. He is good at what he does logistics for perishable foods and vegetables.  In session to help patient coping with Christmas patient said mom passed away rough find not in spirit better with grandchilds have to decorate. Rick's daughter has 6 kids. Stepdaughter has the 6 kids. When patient in picture stepdaughter got closer to him. Calls patient Mom. Wrote a post how lucky to have two moms.  About having kids patient said she was a person who did not care if having kids but ended up having kids also shared difficulty having 4 miscarriages.  Therapist noted patient has family connections that we will help her with Christmas from her assessment        Therapist processed thoughts and feelings as patient updated her to recent events some of her stressors including having COVID that when this send she is feeling better so on impact her Christmas.  This impression from talking with patient is patient having extended family connections that are very supportive of each other have impacted their lives important ways.  For example went to her aunts after separating from first husband, went to live with aunt and uncle when they all moved to [facility].  They have enhanced each other's lives as patient describes.  Aunt and uncle even moved [place] so family here could help them out although uncle has passed.  Processed patient's feelings related to Christmas a lot of plans that therapist assesses will help as she says Christmas can be difficult as mom passed on Christmas.  Patient herself points out having grandkids makes her putting  effort.  In therapy therapist is also able to identify patient's strengths things such as being a good cook really adds to family celebration.  Question therapist learned more about patient's history which helps build relationship talking about her first husband, her miscarriages and expanding on her relationship with aunt and uncle, stepdaughter the many ways her life is also been very rich in these relationships.  Therapist provided active listening open questions supportive interventions.  Suicidal/Homicidal: No  Plan: Return again in 2 weeks.2.Work with patient on mood management setting boundaries  Diagnosis: Bipolar 2, major depressive disorder recurrent moderate, adjustment insomnia  Collaboration of Care: Other none needed  Patient/Guardian was advised Release of Information must be obtained prior to any record release in order to collaborate their care with an outside provider. Patient/Guardian was advised if they have not already done so to contact the registration department to sign all necessary forms in order for [place] to release information regarding their care.   Consent: Patient/Guardian gives verbal consent for treatment and assignment of benefits for services provided during this visit. Patient/Guardian expressed understanding and agreed to proceed.  Cordella Register, LCSW 06/01/2022

## 2022-06-15 ENCOUNTER — Ambulatory Visit (INDEPENDENT_AMBULATORY_CARE_PROVIDER_SITE_OTHER): Payer: Medicare Other | Admitting: Licensed Clinical Social Worker

## 2022-06-15 DIAGNOSIS — F3181 Bipolar II disorder: Secondary | ICD-10-CM

## 2022-06-15 DIAGNOSIS — F331 Major depressive disorder, recurrent, moderate: Secondary | ICD-10-CM | POA: Diagnosis not present

## 2022-06-15 DIAGNOSIS — F5102 Adjustment insomnia: Secondary | ICD-10-CM

## 2022-06-15 NOTE — Progress Notes (Signed)
Virtual Visit via Video Note  I connected with Twilia Yaklin Harwell-Lamb on 06/15/22 at 11:00 AM EST by a video enabled telemedicine application and verified that I am speaking with the correct person using two identifiers.  Location: Patient: home Provider: home office   I discussed the limitations of evaluation and management by telemedicine and the availability of in person appointments. The patient expressed understanding and agreed to proceed.   I discussed the assessment and treatment plan with the patient. The patient was provided an opportunity to ask questions and all were answered. The patient agreed with the plan and demonstrated an understanding of the instructions.   The patient was advised to call back or seek an in-person evaluation if the symptoms worsen or if the condition fails to improve as anticipated.  I provided 53 minutes of non-face-to-face time during this encounter.  THERAPIST PROGRESS NOTE  Session Time: 11:00 AM to 11:53 AM  Participation Level: Active  Behavioral Response: CasualAlertEuthymic  Type of Therapy: Individual Therapy  Treatment Goals addressed: Learning to say no in relationships, addressing depressive issues, work on being more active and motivated, coping  ProgressTowards Goals: Progressing-talked about positive experiences patient has had with her supports over the holidays and also plan to continue to work on being active  Interventions: Solution Focused, Strength-based, Supportive, and Other: coping  Summary: SARAGRACE SELKE is a 67 y.o. female who presents with made a pot roast.Christmas was good. Grandkids came to her house Christmas Eve, their house Christmas Day and Aunt's house Christmas Day. New Konrad Felix Eve was good had friends over.  Enjoyed New Year's Eve show went on to talk about enjoying watching reality show bachelor and bachelorette addiction to that. Really wants to start doing things travel the only thing cost money. Has  have been on social security for years the thought going back to work makes her hesitant. Has bone spur on right painful to walk getting bad limping will need surgery. Part of the problem 2nd floor need a single story friend Aleatha Borer can stay with her. Will check with Insurance see if they will pay for someone to comee in to help get in shower or bathtub a couple days a week. Last surgery on the left was 2017 for the same thing. It was a pretty long recovery. Between Christmas and New Year knee bugging her had a red circle on knee cap called ortho to get in. Put on a round of antibiotics and a Cortizone knee felt better. Has to go in to recheck with ortho. Talked about her cooking techniques and suggested just experiment. Showed grandmother's [place]. Unpacking the [place] in storage.  Mom passed 1990 Christmas Day.  So has been in storage for quite a while.  Good friend know from [place] since 94 lives around the corner.  Her daughter works and really some of this way.  Went on to talk about her interest that she loves live music, theater, ballet nothing better than live. Shared a memorable moment when who she took Vive to [employer] the love did and started to do ballet toward the end.. Talked about ex's relationship with kids.  Talked about with therapist negative attitude is not helpful and he definitely has that holds onto grudges he has not been involved in her kids life. Feels bad for the kids for that. Keenan Bachelor tried but said done. Caryl Pina a little bit of relationship. He never met grandson. Patient asks what does [name] think?Marcello Moores parents very present.  Therapist  presented her perspective that kids will gravitate to people who love them and that she has that in her life.  Patient goes on to say Durenda Guthrie been through this year a lot her health issues and uncle passed away first Christmas he wasn't here and did well. Talk to her every day. Blessing.   Therapist reviewed with patient recent events over the holidays  talking about positive experiences, talking about aspects of her life where she recognized that she is fortunate talks to her granddaughter every day, good friend from [place] lives right around the corner.  Has a good support system therapist noting these things is very fortunate and patient agrees.  Therapist provided perspective that is to be age life still can be rich encouraging that for patient to help with wellbeing and patient herself would like to be more active to follow in this pathway.  Other things patient enjoys cooking special experiences with family such as taking granddaughter to the ballet.  Did note stressors such as having to have surgery but all in all session was therapeutic and being able to focus on positive.  Therapist provided support and space for patient to talk about thoughts and feelings in session. Suicidal/Homicidal: No  Plan: Return again in 2 weeks.2.Work with patient on mood management setting boundaries  Diagnosis: Bipolar 2, major depressive disorder recurrent moderate, adjustment insomnia   Collaboration of Care: Other none needed  Patient/Guardian was advised Release of Information must be obtained prior to any record release in order to collaborate their care with an outside provider. Patient/Guardian was advised if they have not already done so to contact the registration department to sign all necessary forms in order for [place] to release information regarding their care.   Consent: Patient/Guardian gives verbal consent for treatment and assignment of benefits for services provided during this visit. Patient/Guardian expressed understanding and agreed to proceed.   Cordella Register, LCSW 06/15/2022

## 2022-06-20 NOTE — Progress Notes (Signed)
Remote pacemaker transmission.   

## 2022-06-27 ENCOUNTER — Ambulatory Visit (INDEPENDENT_AMBULATORY_CARE_PROVIDER_SITE_OTHER): Payer: 59 | Admitting: Licensed Clinical Social Worker

## 2022-06-27 DIAGNOSIS — F5102 Adjustment insomnia: Secondary | ICD-10-CM

## 2022-06-27 DIAGNOSIS — F3181 Bipolar II disorder: Secondary | ICD-10-CM | POA: Diagnosis not present

## 2022-06-27 DIAGNOSIS — F331 Major depressive disorder, recurrent, moderate: Secondary | ICD-10-CM

## 2022-06-27 NOTE — Progress Notes (Signed)
   THERAPIST PROGRESS NOTE  Session Time: 11:00 AM to 11:48 AM  Participation Level: Active  Behavioral Response: CasualAlertappropriate  Type of Therapy: Individual Therapy  Treatment Goals addressed:  Learning to say no in relationships, addressing depressive issues, work on being more active and motivated, coping  ProgressTowards Goals: Progressing-worked on patient addressing issues so she can be more active including procedure for her foot, working on coping of stressors  Interventions: Solution Focused, Strength-based, Supportive, and Other: coping  Summary: Mary Mcfarland is a 67 y.o. female who presents with doing ok just the weather. Noted getting rid of cookbooks in general older more want to get rid of things. Doesn't have a lot of room so has to anyway. Bone spur limping. Sees ortho next week. Kids are worried about her walking the stairs. Losing Medicaid will go back to health team advantages HMO what was on before. Everything but not food stipend and plan doesn't help pay for plan. Looking at the change everything been ok so it sucks. Hasn't had to ask for help and little bit of money at the end of month. Heel been getting wrose and worse. On side up the heel. Hurt to walk on. Looking forward to so can go to the gym. Doing ok but wishes could do more when can't want to do more. 8 week no weight bearing. Needs to write down things for session even daughter write down or call in the moment. Finally got octopus print. Showed therapist and therapist impressed. Talked more about family history. Therapist started to put together a timeline helpful and putting background information together helpful for therapeutic interventions and understanding patterns possible causes for different issues  Patient updated therapist to current stressors therapist providing space and support for patient to talk about thoughts and feelings helping with coping.  Talked about plans for her coverage,  plans for upcoming procedure for bone spur.  In person therapist could see more difficulty walking so patient will need to address.  Talked about wanting to be on the go more and think she needs to do to make that happen.  Was provided supportive and strength-based interventions active listening open questions active listening open questions.   Suicidal/Homicidal: No  Plan: Return again in 2 weeks.2.  Focus on stressors, patient talking about thoughts and feelings about various issues in her life to help with coping  Diagnosis: Bipolar 2, major depressive disorder recurrent moderate, adjustment insomnia  Collaboration of Care: Other none needed  Patient/Guardian was advised Release of Information must be obtained prior to any record release in order to collaborate their care with an outside provider. Patient/Guardian was advised if they have not already done so to contact the registration department to sign all necessary forms in order for [place] to release information regarding their care.   Consent: Patient/Guardian gives verbal consent for treatment and assignment of benefits for services provided during this visit. Patient/Guardian expressed understanding and agreed to proceed.   Cordella Register, LCSW 06/27/2022

## 2022-06-29 DIAGNOSIS — G4733 Obstructive sleep apnea (adult) (pediatric): Secondary | ICD-10-CM | POA: Diagnosis not present

## 2022-07-01 ENCOUNTER — Encounter: Payer: Self-pay | Admitting: Cardiology

## 2022-07-04 ENCOUNTER — Telehealth (HOSPITAL_BASED_OUTPATIENT_CLINIC_OR_DEPARTMENT_OTHER): Payer: Self-pay

## 2022-07-04 DIAGNOSIS — M79671 Pain in right foot: Secondary | ICD-10-CM | POA: Diagnosis not present

## 2022-07-04 NOTE — Telephone Encounter (Signed)
   Pre-operative Risk Assessment    Patient Name: Mary Mcfarland  DOB: 11-May-1956 MRN: [id number]      Request for Surgical Clearance    Procedure:   Right Heel Pump Bump Excision and Achilles Tendon Repair  Date of Surgery:  Clearance 08/02/22                                 Surgeon:  Melrose Nakayama, MD Surgeon's Group or Practice Name:  [facility] Phone number:  [phone] Fax number:  [phone]   Type of Clearance Requested:   - Medical  - Pharmacy:  Hold Apixaban (Eliquis) how long?   Type of Anesthesia:   CHOICE   Additional requests/questions:   None  Signed, Francella Solian   07/04/2022, 4:46 PM

## 2022-07-05 ENCOUNTER — Other Ambulatory Visit: Payer: Self-pay | Admitting: Orthopaedic Surgery

## 2022-07-05 NOTE — Telephone Encounter (Signed)
Patient with diagnosis of afib on Eliquis for anticoagulation.    Procedure: Right Heel Pump Bump Excision and Achilles Tendon Repair  Date of procedure: 08/02/22  CHA2DS2-VASc Score = 3  This indicates a 3.2% annual risk of stroke. The patient's score is based upon: CHF History: 0 HTN History: 1 Diabetes History: 0 Stroke History: 0 Vascular Disease History: 1 Age Score: 0 Gender Score: 1   CrCl 4m/min using adjusted body weight Platelet count 310K  Per office protocol, patient can hold Eliquis for 1-2 days prior to procedure.    **This guidance is not considered finalized until pre-operative APP has relayed final recommendations.**

## 2022-07-05 NOTE — Telephone Encounter (Signed)
   Name: Mary Mcfarland  DOB: Sep 05, 1955  MRN: [id number]  Primary Cardiologist: Buford Dresser, MD   Preoperative team, please contact this patient and set up a phone call appointment for further preoperative risk assessment. Please obtain consent and complete medication review. Thank you for your help.  I confirm that guidance regarding antiplatelet and oral anticoagulation therapy has been completed and, if necessary, noted below.  Per office protocol, patient can hold Eliquis for 1-2 days prior to procedure.     Lenna Sciara, NP 07/05/2022, 12:10 PM [place]

## 2022-07-06 ENCOUNTER — Telehealth: Payer: Self-pay | Admitting: *Deleted

## 2022-07-06 NOTE — Telephone Encounter (Signed)
Pt has been scheduled for tele pre op appt 07/22/22 @ 9:20. Med rec and consent are done.     Patient Consent for Virtual Visit        Mary Mcfarland has provided verbal consent on 07/06/2022 for a virtual visit (video or telephone).   CONSENT FOR VIRTUAL VISIT FOR:  Mary Mcfarland  By participating in this virtual visit I agree to the following:  I hereby voluntarily request, consent and authorize [facility] and its employed or contracted physicians, physician assistants, nurse practitioners or other licensed health care professionals (the Practitioner), to provide me with telemedicine health care services (the "Services") as deemed necessary by the treating Practitioner. I acknowledge and consent to receive the Services by the Practitioner via telemedicine. I understand that the telemedicine visit will involve communicating with the Practitioner through live audiovisual communication technology and the disclosure of certain medical information by electronic transmission. I acknowledge that I have been given the opportunity to request an in-person assessment or other available alternative prior to the telemedicine visit and am voluntarily participating in the telemedicine visit.  I understand that I have the right to withhold or withdraw my consent to the use of telemedicine in the course of my care at any time, without affecting my right to future care or treatment, and that the Practitioner or I may terminate the telemedicine visit at any time. I understand that I have the right to inspect all information obtained and/or recorded in the course of the telemedicine visit and may receive copies of available information for a reasonable fee.  I understand that some of the potential risks of receiving the Services via telemedicine include:  Delay or interruption in medical evaluation due to technological equipment failure or disruption; Information transmitted may not be  sufficient (e.g. poor resolution of images) to allow for appropriate medical decision making by the Practitioner; and/or  In rare instances, security protocols could fail, causing a breach of personal health information.  Furthermore, I acknowledge that it is my responsibility to provide information about my medical history, conditions and care that is complete and accurate to the best of my ability. I acknowledge that Practitioner's advice, recommendations, and/or decision may be based on factors not within their control, such as incomplete or inaccurate data provided by me or distortions of diagnostic images or specimens that may result from electronic transmissions. I understand that the practice of medicine is not an exact science and that Practitioner makes no warranties or guarantees regarding treatment outcomes. I acknowledge that a copy of this consent can be made available to me via my patient portal ([facility]), or I can request a printed copy by calling the office of [facility].    I understand that my insurance will be billed for this visit.   I have read or had this consent read to me. I understand the contents of this consent, which adequately explains the benefits and risks of the Services being provided via telemedicine.  I have been provided ample opportunity to ask questions regarding this consent and the Services and have had my questions answered to my satisfaction. I give my informed consent for the services to be provided through the use of telemedicine in my medical care

## 2022-07-06 NOTE — Telephone Encounter (Signed)
Pt has been scheduled for tele pre op appt 07/22/22 @ 9:20. Med rec and consent are done.

## 2022-07-12 ENCOUNTER — Ambulatory Visit (HOSPITAL_COMMUNITY): Payer: Medicare Other | Admitting: Licensed Clinical Social Worker

## 2022-07-12 DIAGNOSIS — R1010 Upper abdominal pain, unspecified: Secondary | ICD-10-CM | POA: Diagnosis not present

## 2022-07-12 DIAGNOSIS — K219 Gastro-esophageal reflux disease without esophagitis: Secondary | ICD-10-CM | POA: Diagnosis not present

## 2022-07-12 DIAGNOSIS — R1310 Dysphagia, unspecified: Secondary | ICD-10-CM | POA: Diagnosis not present

## 2022-07-12 DIAGNOSIS — R140 Abdominal distension (gaseous): Secondary | ICD-10-CM | POA: Diagnosis not present

## 2022-07-12 DIAGNOSIS — K319 Disease of stomach and duodenum, unspecified: Secondary | ICD-10-CM | POA: Diagnosis not present

## 2022-07-12 DIAGNOSIS — K3189 Other diseases of stomach and duodenum: Secondary | ICD-10-CM | POA: Diagnosis not present

## 2022-07-12 DIAGNOSIS — Z8719 Personal history of other diseases of the digestive system: Secondary | ICD-10-CM | POA: Diagnosis not present

## 2022-07-13 DIAGNOSIS — G4733 Obstructive sleep apnea (adult) (pediatric): Secondary | ICD-10-CM | POA: Diagnosis not present

## 2022-07-15 ENCOUNTER — Ambulatory Visit (INDEPENDENT_AMBULATORY_CARE_PROVIDER_SITE_OTHER): Payer: HMO | Admitting: Licensed Clinical Social Worker

## 2022-07-15 DIAGNOSIS — N930 Postcoital and contact bleeding: Secondary | ICD-10-CM | POA: Diagnosis not present

## 2022-07-15 DIAGNOSIS — F3181 Bipolar II disorder: Secondary | ICD-10-CM

## 2022-07-15 DIAGNOSIS — F5102 Adjustment insomnia: Secondary | ICD-10-CM | POA: Diagnosis not present

## 2022-07-15 DIAGNOSIS — F331 Major depressive disorder, recurrent, moderate: Secondary | ICD-10-CM

## 2022-07-15 NOTE — Progress Notes (Signed)
Virtual Visit via Video Note  I connected with Mary Mcfarland on 07/15/22 at  8:00 AM EST by a video enabled telemedicine application and verified that I am speaking with the correct person using two identifiers.  Location: Patient: home Provider: home office   I discussed the limitations of evaluation and management by telemedicine and the availability of in person appointments. The patient expressed understanding and agreed to proceed.   I discussed the assessment and treatment plan with the patient. The patient was provided an opportunity to ask questions and all were answered. The patient agreed with the plan and demonstrated an understanding of the instructions.   The patient was advised to call back or seek an in-person evaluation if the symptoms worsen or if the condition fails to improve as anticipated.  I provided 53 minutes of non-face-to-face time during this encounter.  THERAPIST PROGRESS NOTE  Session Time: 8:00 AM to 8:53 AM  Participation Level: Active  Behavioral Response: CasualAlertappropriate  Type of Therapy: Individual Therapy  Treatment Goals addressed: Learning to say no in relationships, addressing depressive issues, work on being more active and motivated, coping  ProgressTowards Goals: Progressing-worked on strategies for depression noting patient is actively working on treatment goals relates meditation, engaging in activity to encourage positive energy  Interventions: CBT, Solution Focused, Strength-based, Supportive, and Other: coping  Summary: Mary Mcfarland is a 67 y.o. female who presents with surgery scheduled for bone heel achilles surgery-it's a bone spur. Only getting worse. Live on second floor up and down stairs is rough. Stiff when first get up and walk. Will be at aunt's when recovers from surgery. Daughter suggested to get tattoos like zippers has to be healed. Called the gym put membership on hold haven't been able to do anything  since joined. Started meditating. Bought oracle cards about energy pull three cards. Tell you different things about energy how to overcome things it is cool very positive doing that for about three weeks. Helps trying to get more positive in life. Put on Spodify and put on meditation music. Sits end of sofa in the quiet deep breaths close eyes and clear mind. Changes her mood trying to be on positive try not to think about bad side glass half full stay positive in crazy world we live in.  Therapist noted another way to name and is working on a healthy ego patient agrees added less doubt trying to pivot her life. Got mouth pieces for sleep apnea. It really affects your health all kinds of stuff. Download app record you take back to doctor how make adjustments. Working on well-being.  1 of patient's interest in therapist to his cooking she was guiding therapist and giving her advice said once do it and no pressure and mess around get into more.  Talked about different topics like cooking, restaurants. Lives [place] and therapist noted how offers opportunities to get out and enjoy herself going to different places. Has been busy with appointments. Likes the freedom having her own place.   Therapist work with patient on depression noting one of the first things you do is be more active patient limited as she is about to have surgery.  Still clears as she says she is working on Engineer, maintenance, reframing to positive therapist labeled it working on a healthy ego that can be supportive.  Noted patient's location also was positive she is downtown and can take advantage of things and enjoy herself going to different places and patient agrees.  Session also included topics that both therapist and patient enjoy restaurants cooking therapist pointed out cooking improves quality of life, it is to create a process also have good food so very positive activity for patient additionally she is very skilled a  positive hobby for her.  Session was positive as well patient giving therapist some tips about cooking sharing an interest in common.  Therapist noted besides activities it is how we think is going to be how we feel which connected with patient and her activity of working on ways to have positive energy in her life.  Therapist provided support and space for patient to talk about thoughts and feelings in session.  Suicidal/Homicidal: No  Plan: Return again in 2 weeks.2. Focus on stressors, patient talking about thoughts and feelings about various issues in her life to help with coping  Diagnosis: Bipolar 2, major depressive disorder recurrent moderate, adjustment insomnia  Collaboration of Care: Other none needed  Patient/Guardian was advised Release of Information must be obtained prior to any record release in order to collaborate their care with an outside provider. Patient/Guardian was advised if they have not already done so to contact the registration department to sign all necessary forms in order for [place] to release information regarding their care.   Consent: Patient/Guardian gives verbal consent for treatment and assignment of benefits for services provided during this visit. Patient/Guardian expressed understanding and agreed to proceed.   Cordella Register, LCSW 07/15/2022

## 2022-07-19 ENCOUNTER — Encounter (HOSPITAL_BASED_OUTPATIENT_CLINIC_OR_DEPARTMENT_OTHER): Payer: Self-pay

## 2022-07-19 DIAGNOSIS — N930 Postcoital and contact bleeding: Secondary | ICD-10-CM | POA: Diagnosis not present

## 2022-07-19 NOTE — Progress Notes (Signed)
COVID Vaccine received:  '[]'$  No '[x]'$  Yes Date of any COVID positive Test in last 90 days:  PCP - Dr. Lona Kettle Cardiologist - Dr. Buford Dresser EP: Dr. Lars Mage   Chest x-ray - 08-23-2021  epic EKG -10-01-2021    epic Stress Test -  ECHO -  Cardiac Cath -   PCR screen: '[]'$  Ordered & Completed                      '[]'$   No Order but Needs PROFEND                      '[x]'$   N/A for this surgery  Surgery Plan:  '[x]'$  Ambulatory                            '[]'$  Outpatient in bed                            '[]'$  Admit  Anesthesia:    '[]'$  General  '[]'$  Spinal                           '[x]'$   Choice '[]'$   MAC  Pacemaker / ICD device '[]'$  No '[x]'$  Yes        Device order form faxed '[]'$  No    '[]'$   Yes     Device order request sent [id number] Last PPM check: Remote 02-06-2020  Spinal Cord Stimulator:'[x]'$  No '[]'$  Yes      (Remind patient to bring remote DOS) Other Implants:   History of Sleep Apnea? '[]'$  No '[x]'$  Yes   CPAP used?- '[x]'$  No '[]'$  Yes    Does the patient monitor blood sugar? '[]'$  No '[]'$  Yes  '[]'$  N/A  Patient has: '[]'$  Pre-DM   '[]'$  DM1  '[]'$   DM2 Does patient have a [employer] or Dexacom? '[]'$  No '[]'$  Yes   Fasting Blood Sugar Ranges-  Checks Blood Sugar _____ times a day  Last dose of GLP1 agonist-  GLP1 instructions:  Last dose of SGLT-2 inhibitors-  SGLT-2 instructions:  Other Diabetic medications/ instructions:   Blood Thinner / Instructions:Eliquis  Hold 2 days ok'd by Diona Browner, NP note 07-05-22 Aspirin Instructions:none  ERAS Protocol Ordered: '[]'$  No  '[x]'$  Yes PRE-SURGERY '[]'$  ENSURE  '[x]'$  G2  Patient is to be NPO after: 09:00 am  Comments:   Activity level: Patient can / can not climb a flight of stairs without difficulty; '[]'$  No CP  '[]'$  No SOB, but would have ______   Patient can / can not perform ADLs without assistance.   Anesthesia review: A.fib- cardioversion, HTN, SSS (Dual chamber St. Jude PPM), OSA (no CPAP), asthma, Bipolar II, Pre-DM,  Patient denies shortness  of breath, fever, cough and chest pain at PAT appointment.  Patient verbalized understanding and agreement to the Pre-Surgical Instructions that were given to them at this PAT appointment. Patient was also educated of the need to review these PAT instructions again prior to his/her surgery.I reviewed the appropriate phone numbers to call if they have any and questions or concerns.

## 2022-07-19 NOTE — Patient Instructions (Signed)
SURGICAL WAITING ROOM VISITATION Patients having surgery or a procedure may have no more than 2 support people in the waiting area - these visitors may rotate in the visitor waiting room.   Due to an increase in RSV and influenza rates and associated hospitalizations, children ages 26 and under may not visit patients in [place]. If the patient needs to stay at the hospital during part of their recovery, the visitor guidelines for inpatient rooms apply.  PRE-OP VISITATION  Pre-op nurse will coordinate an appropriate time for 1 support person to accompany the patient in pre-op.  This support person may not rotate.  This visitor will be contacted when the time is appropriate for the visitor to come back in the pre-op area.  Please refer to the [facility] website for the visitor guidelines for Inpatients (after your surgery is over and you are in a regular room).  You are not required to quarantine at this time prior to your surgery. However, you must do this: Hand Hygiene often Do NOT share personal items Notify your provider if you are in close contact with someone who has COVID or you develop fever 100.4 or greater, new onset of sneezing, cough, sore throat, shortness of breath or body aches.  If you test positive for Covid or have been in contact with anyone that has tested positive in the last 10 days please notify you surgeon.    Your procedure is scheduled on:  Tuesday  August 02, 2022  Report to [facility] Main Entrance: [place] entrance where the [employer] is available.   Report to admitting at:  09:45   AM  +++++Call this number if you have any questions or problems the morning of surgery [phone]  Do not eat food after Midnight the night prior to your surgery/procedure.  After Midnight you may have the following liquids until  09:00  AM DAY OF SURGERY  Clear Liquid Diet Water Black Coffee (sugar ok, NO MILK/CREAM OR CREAMERS)  Tea (sugar ok,  NO MILK/CREAM OR CREAMERS) regular and decaf                             Plain Jell-O  with no fruit (NO RED)                                           Fruit ices (not with fruit pulp, NO RED)                                     Popsicles (NO RED)                                                                  Juice: apple, WHITE grape, WHITE cranberry Sports drinks like Gatorade or Powerade (NO RED)                    The day of surgery:  Drink ONE (1) Pre-Surgery G2 at  09:00  AM the morning of surgery. Drink in one  sitting. Do not sip.  This drink was given to you during your hospital pre-op appointment visit. Nothing else to drink after completing the Pre-Surgery G2 : No candy, chewing gum or throat lozenges.    FOLLOW  ANY ADDITIONAL PRE OP INSTRUCTIONS YOU RECEIVED FROM YOUR SURGEON'S OFFICE!!!   Oral Hygiene is also important to reduce your risk of infection.        Remember - BRUSH YOUR TEETH THE MORNING OF SURGERY WITH YOUR REGULAR TOOTHPASTE  Do NOT smoke after Midnight the night before surgery.  Take ONLY these medicines the morning of surgery with A SIP OF WATER: Diltiazem-XR, Bupropion (Wellbutrin), Lamotrigine (Lamictal), and dofetilide (Tikosyn)?????   If You have been diagnosed with Sleep Apnea - Bring CPAP mask and tubing day of surgery. We will provide you with a CPAP machine on the day of your surgery.                   You may not have any metal on your body including hair pins, jewelry, and body piercing  Do not wear make-up, lotions, powders, perfumes or deodorant  Do not wear nail polish including gel and S&S, artificial / acrylic nails, or any other type of covering on natural nails including finger and toenails. If you have artificial nails, gel coating, etc., that needs to be removed by a nail salon, Please have this removed prior to surgery. Not doing so may mean that your surgery could be cancelled or delayed if the Surgeon or anesthesia staff feels like  they are unable to monitor you safely.   Do not shave 48 hours prior to surgery to avoid nicks in your skin which may contribute to postoperative infections.    Contacts, Hearing Aids, dentures or bridgework may not be worn into surgery. DENTURES WILL BE REMOVED PRIOR TO SURGERY PLEASE DO NOT APPLY "Poly grip" OR ADHESIVES!!!   Patients discharged on the day of surgery will not be allowed to drive home.  Someone NEEDS to stay with you for the first 24 hours after anesthesia.  Do not bring your home medications to the hospital. The Pharmacy will dispense medications listed on your medication list to you during your admission in the Hospital.  Special Instructions: Bring a copy of your healthcare power of attorney and living will documents the day of surgery, if you wish to have them scanned into your [facility] Records- EPIC  Please read over the following fact sheets you were given: IF YOU HAVE QUESTIONS ABOUT YOUR PRE-OP INSTRUCTIONS, PLEASE CALL [phone]  ([place])   [place] - Preparing for Surgery Before surgery, you can play an important role.  Because skin is not sterile, your skin needs to be as free of germs as possible.  You can reduce the number of germs on your skin by washing with CHG (chlorahexidine gluconate) soap before surgery.  CHG is an antiseptic cleaner which kills germs and bonds with the skin to continue killing germs even after washing. Please DO NOT use if you have an allergy to CHG or antibacterial soaps.  If your skin becomes reddened/irritated stop using the CHG and inform your nurse when you arrive at Short Stay. Do not shave (including legs and underarms) for at least 48 hours prior to the first CHG shower.  You may shave your face/neck.  Please follow these instructions carefully:  1.  Shower with CHG Soap the night before surgery and the  morning of surgery.  2.  If you choose  to wash your hair, wash your hair first as usual with your normal   shampoo.  3.  After you shampoo, rinse your hair and body thoroughly to remove the shampoo.                             4.  Use CHG as you would any other liquid soap.  You can apply chg directly to the skin and wash.  Gently with a scrungie or clean washcloth.  5.  Apply the CHG Soap to your body ONLY FROM THE NECK DOWN.   Do not use on face/ open                           Wound or open sores. Avoid contact with eyes, ears mouth and genitals (private parts).                       Wash face,  Genitals (private parts) with your normal soap.             6.  Wash thoroughly, paying special attention to the area where your  surgery  will be performed.  7.  Thoroughly rinse your body with warm water from the neck down.  8.  DO NOT shower/wash with your normal soap after using and rinsing off the CHG Soap.            9.  Pat yourself dry with a clean towel.            10.  Wear clean pajamas.            11.  Place clean sheets on your bed the night of your first shower and do not  sleep with pets.  ON THE DAY OF SURGERY : Do not apply any lotions/deodorants the morning of surgery.  Please wear clean clothes to the hospital/surgery center.    FAILURE TO FOLLOW THESE INSTRUCTIONS MAY RESULT IN THE CANCELLATION OF YOUR SURGERY  PATIENT SIGNATURE_________________________________  NURSE SIGNATURE__________________________________  ________________________________________________________________________        Mary Mcfarland    An incentive spirometer is a tool that can help keep your lungs clear and active. This tool measures how well you are filling your lungs with each breath. Taking long deep breaths may help reverse or decrease the chance of developing breathing (pulmonary) problems (especially infection) following: A long period of time when you are unable to move or be active. BEFORE THE PROCEDURE  If the spirometer includes an indicator to show your best effort, your nurse or  respiratory therapist will set it to a desired goal. If possible, sit up straight or lean slightly forward. Try not to slouch. Hold the incentive spirometer in an upright position. INSTRUCTIONS FOR USE  Sit on the edge of your bed if possible, or sit up as far as you can in bed or on a chair. Hold the incentive spirometer in an upright position. Breathe out normally. Place the mouthpiece in your mouth and seal your lips tightly around it. Breathe in slowly and as deeply as possible, raising the piston or the ball toward the top of the column. Hold your breath for 3-5 seconds or for as long as possible. Allow the piston or ball to fall to the bottom of the column. Remove the mouthpiece from your mouth and breathe out normally. Rest for a few seconds and repeat Steps  1 through 7 at least 10 times every 1-2 hours when you are awake. Take your time and take a few normal breaths between deep breaths. The spirometer may include an indicator to show your best effort. Use the indicator as a goal to work toward during each repetition. After each set of 10 deep breaths, practice coughing to be sure your lungs are clear. If you have an incision (the cut made at the time of surgery), support your incision when coughing by placing a pillow or rolled up towels firmly against it. Once you are able to get out of bed, walk around indoors and cough well. You may stop using the incentive spirometer when instructed by your caregiver.  RISKS AND COMPLICATIONS Take your time so you do not get dizzy or light-headed. If you are in pain, you may need to take or ask for pain medication before doing incentive spirometry. It is harder to take a deep breath if you are having pain. AFTER USE Rest and breathe slowly and easily. It can be helpful to keep track of a log of your progress. Your caregiver can provide you with a simple table to help with this. If you are using the spirometer at home, follow these  instructions: [place] IF:  You are having difficultly using the spirometer. You have trouble using the spirometer as often as instructed. Your pain medication is not giving enough relief while using the spirometer. You develop fever of 100.5 F (38.1 C) or higher.                                                                                                    SEEK IMMEDIATE MEDICAL CARE IF:  You cough up bloody sputum that had not been present before. You develop fever of 102 F (38.9 C) or greater. You develop worsening pain at or near the incision site. MAKE SURE YOU:  Understand these instructions. Will watch your condition. Will get help right away if you are not doing well or get worse. Document Released: 10/10/2006 Document Revised: 08/22/2011 Document Reviewed: 12/11/2006 [facility] Patient Information 2014 [place], [place].

## 2022-07-20 ENCOUNTER — Encounter (HOSPITAL_COMMUNITY): Payer: Self-pay

## 2022-07-20 ENCOUNTER — Other Ambulatory Visit: Payer: Self-pay

## 2022-07-20 ENCOUNTER — Encounter (HOSPITAL_COMMUNITY)
Admission: RE | Admit: 2022-07-20 | Discharge: 2022-07-20 | Disposition: A | Discharge status: Home / Self Care | Payer: HMO | Source: Ambulatory Visit | Attending: Orthopaedic Surgery | Admitting: Orthopaedic Surgery

## 2022-07-20 VITALS — BP 127/58 | HR 68 | Temp 98.8°F | Resp 18 | Ht 66.0 in | Wt 258.0 lb

## 2022-07-20 DIAGNOSIS — I495 Sick sinus syndrome: Secondary | ICD-10-CM | POA: Insufficient documentation

## 2022-07-20 DIAGNOSIS — R7303 Prediabetes: Secondary | ICD-10-CM | POA: Diagnosis not present

## 2022-07-20 DIAGNOSIS — Z01812 Encounter for preprocedural laboratory examination: Secondary | ICD-10-CM | POA: Diagnosis not present

## 2022-07-20 DIAGNOSIS — M7731 Calcaneal spur, right foot: Secondary | ICD-10-CM | POA: Diagnosis not present

## 2022-07-20 DIAGNOSIS — G4733 Obstructive sleep apnea (adult) (pediatric): Secondary | ICD-10-CM | POA: Diagnosis not present

## 2022-07-20 DIAGNOSIS — J45909 Unspecified asthma, uncomplicated: Secondary | ICD-10-CM | POA: Insufficient documentation

## 2022-07-20 DIAGNOSIS — I10 Essential (primary) hypertension: Secondary | ICD-10-CM | POA: Diagnosis not present

## 2022-07-20 DIAGNOSIS — I4811 Longstanding persistent atrial fibrillation: Secondary | ICD-10-CM | POA: Insufficient documentation

## 2022-07-20 DIAGNOSIS — Z950 Presence of cardiac pacemaker: Secondary | ICD-10-CM | POA: Insufficient documentation

## 2022-07-20 DIAGNOSIS — Z87891 Personal history of nicotine dependence: Secondary | ICD-10-CM | POA: Insufficient documentation

## 2022-07-20 HISTORY — DX: Other specified postprocedural states: Z98.890

## 2022-07-20 HISTORY — DX: Nausea with vomiting, unspecified: R11.2

## 2022-07-20 LAB — HEMOGLOBIN A1C
Hgb A1c MFr Bld: 6 % — ABNORMAL HIGH (ref 4.8–5.6)
Mean Plasma Glucose: 125.5 mg/dL

## 2022-07-20 LAB — BASIC METABOLIC PANEL
Anion gap: 10 (ref 5–15)
BUN: 20 mg/dL (ref 8–23)
CO2: 27 mmol/L (ref 22–32)
Calcium: 9.4 mg/dL (ref 8.9–10.3)
Chloride: 105 mmol/L (ref 98–111)
Creatinine, Ser: 0.85 mg/dL (ref 0.44–1.00)
GFR, Estimated: >60 mL/min (ref >60)
Glucose, Bld: 108 mg/dL — ABNORMAL HIGH (ref 70–99)
Potassium: 4.4 mmol/L (ref 3.5–5.1)
Sodium: 142 mmol/L (ref 135–145)

## 2022-07-20 LAB — CBC
HCT: 39.3 % (ref 36.0–46.0)
Hemoglobin: 12.1 g/dL (ref 12.0–15.0)
MCH: 27.1 pg (ref 26.0–34.0)
MCHC: 30.8 g/dL (ref 30.0–36.0)
MCV: 88.1 fL (ref 80.0–100.0)
Platelets: 302 10*3/uL (ref 150–400)
RBC: 4.46 MIL/uL (ref 3.87–5.11)
RDW: 14.6 % (ref 11.5–15.5)
WBC: 10.9 10*3/uL — ABNORMAL HIGH (ref 4.0–10.5)
nRBC: 0 % (ref 0.0–0.2)

## 2022-07-20 LAB — GLUCOSE, CAPILLARY: Glucose-Capillary: 107 mg/dL — ABNORMAL HIGH (ref 70–99)

## 2022-07-21 ENCOUNTER — Encounter (HOSPITAL_COMMUNITY): Payer: Self-pay | Admitting: *Deleted

## 2022-07-21 NOTE — Progress Notes (Signed)
Virtual Visit via Telephone Note   Because of Mary Mcfarland's co-morbid illnesses, she is at least at moderate risk for complications without adequate follow up.  This format is felt to be most appropriate for this patient at this time.  The patient did not have access to video technology/had technical difficulties with video requiring transitioning to audio format only (telephone).  All issues noted in this document were discussed and addressed.  No physical exam could be performed with this format.  Please refer to the patient's chart for her consent to telehealth for [facility].  Evaluation Performed:  Preoperative cardiovascular risk assessment _____________   Date:  07/21/2022   Patient ID:  Mary Mcfarland, Mary Mcfarland 10-Oct-1955, MRN [id number] Patient Location:  Home Provider location:   Office  Primary Care Provider:  Lawerance Cruel, MD Primary Cardiologist:  Buford Dresser, MD  Chief Complaint / Patient Profile   67 y.o. y/o female with a h/o atrial fibrillation, sinus pauses s/p PPM, HTN, HLD, obesity who is pending  Right Heel Pump Bump Excision and Achilles Tendon Repair  and presents today for telephonic preoperative cardiovascular risk assessment.  History of Present Illness    Mary Mcfarland is a 67 y.o. female who presents via audio/video conferencing for a telehealth visit today.  Pt was last seen in cardiology clinic on 08/30/2021 by Dr. Harrell Gave for follow-up of ED visit for sharp stabbing left-sided chest pain..  At that time Mary Mcfarland was doing well she has suffered a injury to her knee while lifting weights and was scheduled to see a pulmonologist regarding shortness of breath.  Her chest pain did not recur and therefore Dr. Harrell Gave did not think further extensive cardiac workup was needed. The patient is now pending procedure as outlined above. Since her last visit, she reports that her chest pain has not reoccurred  and she has no new cardiac complaints at this time.  She denies chest pain, shortness of breath, lower extremity edema, fatigue, palpitations, melena, hematuria, hemoptysis, diaphoresis, weakness, presyncope, syncope, orthopnea, and PND.   Per office protocol, patient can hold Eliquis for 1-2 days prior to procedure.   Past Medical History    Past Medical History:  Diagnosis Date   Anticoagulant long-term use    eliquis--- managed by cardiology   Asthma, mild persistent    pulmonologist--- dr Rayann Heman;  (04-08-2022  pt stated last rescue inhaler use 2 months ago)   Bipolar 2 disorder ([place])    Cardiac pacemaker in situ 02/06/2020   for tachy-brady syndrome and symptomatic bradycardia;   [place]  (ep cardiologist--- dr Rayann Heman)   Cystocele with rectocele    Difficult intubation 03/2016   04-08-2022  pt stated was scheduled for achilles tendon repair @ [facility] prior to when was done @ [facility] on 10-03-207,  when she woke up was told by nurse the surgery was note done due to Narrow airway and did not have what was needed so surgery was rescheduled (no documentation of any type in epic about this incident) per anesthesia record from 03-15-2016 pt had no issues and pt stated no issues   Diverticulosis of colon    DOE (dyspnea on exertion)    pt stated due to aftrial fib, sob,  is able to do household chores but with long distance walking and stairs get sob   Edema of both lower extremities    GERD (gastroesophageal reflux disease)    History of adenomatous polyp of  colon    History of diverticulitis of colon    Hyperlipidemia    Hypertension    Longstanding persistent atrial fibrillation [facility]) 2017   cardiologist-- dr c. Shawna Orleans;  for dx 2017 the reacurrance 01/ 2021;  s/p DCCV 07-24-2019;  admitted for tikosyn loading 03/ 2021;  sss s/p dual PPM 02-06-2020   MDD (major depressive disorder)    OA (osteoarthritis)    OSA (obstructive sleep apnea)    sleep study in epic 04-06-2021 OSA  and nocturnal hypoxemia, per pt was unable to tolerate bipap recommended but does have appointment to get dental appliance   Pelvic prolapse    PONV (postoperative nausea and vomiting)    Pre-diabetes    Sciatica of right side    SSS (sick sinus syndrome) ([place])    Thyroid nodule    followed by pcp;  last ultrasound in epic 12-09-2011 pt stated never had bx done   Vitamin D deficiency    Wears glasses    Past Surgical History:  Procedure Laterality Date   ANTERIOR AND POSTERIOR REPAIR N/A 04/14/2022   Procedure: POSTERIOR REPAIR (RECTOCELE);  Surgeon: Delsa Bern, MD;  Location: [facility];  Service: Gynecology;  Laterality: N/A;   CARDIOVERSION N/A 07/24/2019   Procedure: CARDIOVERSION;  Surgeon: Buford Dresser, MD;  Location: [facility] ENDOSCOPY;  Service: Cardiovascular;  Laterality: N/A;   COLONOSCOPY WITH PROPOFOL  11/2020   dr Therisa Doyne   HEEL SPUR RESECTION Left 03/15/2016   Procedure: ACHILLIES TENDON REPAIR AND PUMP BUMP EXCISION;  Surgeon: Melrose Nakayama, MD;  Location: [facility];  Service: Orthopedics;  Laterality: Left;   PACEMAKER IMPLANT N/A 02/06/2020   Procedure: PACEMAKER IMPLANT;  Surgeon: Thompson Grayer, MD;  Location: [place] [facility];  Service: Cardiovascular;  Laterality: N/A;   PERINEOPLASTY N/A 04/14/2022   Procedure: PERINEOPLASTY;  Surgeon: Delsa Bern, MD;  Location[facility];  Service: Gynecology;  Laterality: N/A;   SHOULDER ARTHROSCOPY Right 11/15/2005   @[facility]$  by dr Rhona Raider   TOE SURGERY Right 2011   bone spur   TONSILLECTOMY  1979    Allergies  Allergies  Allergen Reactions   Niacin Hives    Home Medications    Prior to Admission medications   Medication Sig Start Date End Date Taking? Authorizing Provider  acetaminophen (TYLENOL) 500 MG tablet take 2 tablets po every 6 hours for 5 days then prn-post operative pain 04/14/22   Earnstine Regal, PA-C  albuterol (PROVENTIL HFA;VENTOLIN HFA) 108 (90 Base) MCG/ACT  inhaler Inhale 2 puffs into the lungs every 6 (six) hours as needed for wheezing or shortness of breath.    [provider]  atorvastatin (LIPITOR) 40 MG tablet TAKE ONE TABLET BY MOUTH ONCE DAILY 11/22/21   Buford Dresser, MD  buPROPion [facility] SR) 150 MG 12 hr tablet TAKE TWO TABLETS BY MOUTH ONCE DAILY 05/23/22   Merian Capron, MD  Cholecalciferol (VITAMIN D3) 50 MCG (2000 UT) capsule Take 2,000 Units by mouth daily.    [provider]  DILT-XR 120 MG 24 hr capsule TAKE ONE CAPSULE BY MOUTH ONCE DAILY 11/22/21   Buford Dresser, MD  dofetilide (TIKOSYN) 250 MCG capsule Take 1 capsule (250 mcg total) by mouth 2 (two) times daily. 10/01/21   Vickie Epley, MD  ELIQUIS 5 MG TABS tablet TAKE ONE TABLET BY MOUTH TWICE DAILY 05/23/22   Vickie Epley, MD  furosemide (LASIX) 40 MG tablet TAKE ONE TABLET BY MOUTH TWICE DAILY. take afternoon DOSE ONLY AS NEEDED  Patient taking differently: Take 80 mg by mouth daily. 11/22/21   Buford Dresser, MD  lamoTRIgine (LAMICTAL) 100 MG tablet TAKE ONE TABLET BY MOUTH ONCE DAILY 05/23/22   Merian Capron, MD  lisinopril (ZESTRIL) 10 MG tablet Take 10 mg by mouth daily. 01/21/21   [provider]  Naphazoline-Pheniramine (OPCON-A) 0.027-0.315 % SOLN Place 1-2 drops into both eyes 3 (three) times daily as needed (for allergies).    [provider]  omeprazole (PRILOSEC) 20 MG capsule Take 20 mg by mouth daily before breakfast.    [provider]  oxyCODONE (ROXICODONE) 5 MG immediate release tablet take 1 tablet po every 4 hours prn for breakthrough post operative pain Patient not taking: Reported on 07/19/2022 04/14/22   Earnstine Regal, PA-C  valACYclovir (VALTREX) 1000 MG tablet Take 1 tablet (1,000 mg total) by mouth 3 (three) times daily. Patient taking differently: Take 1,000 mg by mouth 3 (three) times daily as needed (shingles). 07/28/20   Brunetta Jeans, PA-C  Wheat Dextrin (BENEFIBER)  POWD Take 1 Dose by mouth daily. 1 dose = 1 tablespoon    [provider]  zolpidem (AMBIEN) 10 MG tablet Take 10 mg by mouth at bedtime. 08/04/20   [provider]    Physical Exam    Vital Signs:  Kiyona Schechter Mcfarland does not have vital signs available for review today.  Given telephonic nature of communication, physical exam is limited. AAOx3. NAD. Normal affect.  Speech and respirations are unlabored.  Accessory Clinical Findings    None  Assessment & Plan    1.  Preoperative Cardiovascular Risk Assessment:  -The patient affirms she has been doing well without any new cardiac symptoms. They are able to achieve 4 METS without cardiac limitations. Therefore, based on ACC/AHA guidelines, the patient would be at acceptable risk for the planned procedure without further cardiovascular testing. The patient was advised that if she develops new symptoms prior to surgery to contact our office to arrange for a follow-up visit, and she verbalized understanding.   Ms. Mcfarland's perioperative risk of a major cardiac event is 0.4% according to the Revised Cardiac Risk Index (RCRI).  Therefore, she is at low risk for perioperative complications.   Her functional capacity is fair at 4.73 METs according to the Duke Activity Status Index (DASI). Recommendations: According to ACC/AHA guidelines, no further cardiovascular testing needed.  The patient may proceed to surgery at acceptable risk.   Antiplatelet and/or Anticoagulation Recommendations:  Eliquis (Apixaban) can be held for 1-2 days prior to surgery.  Please resume post op when felt to be safe.      The patient was advised that if she develops new symptoms prior to surgery to contact our office to arrange for a follow-up visit, and she verbalized understanding.  Time:   Today, I have spent 6 minutes with the patient with telehealth technology discussing medical history, symptoms, and management plan.     Mable Fill,  Mary Nestle, NP  07/21/2022, 11:18 AM

## 2022-07-21 NOTE — Progress Notes (Addendum)
Anesthesia Chart Review   Case: [id number] Date/Time: 08/02/22 1148   Procedure: RIGHT HEEL PUMP BUMP EXCISION AND ACHILLES TENDON REPAIR (Right)   Anesthesia type: Choice   Pre-op diagnosis: RIGHT HEEL PUMP BUMP AND ACHILLES SPUR   Location: [facility] ROOM 06 / [facility]   Surgeons: Melrose Nakayama, MD       DISCUSSION:66 y.o. former smoker with h/o PONV, OSA, HTN, asthma, atrial fibrillation, SSS with pacemaker in place (device orders requested), right heel pump bump and achilles spur scheduled for above procedure 08/02/2022 with Dr. Melrose Nakayama.   H/o difficult intubation.  Per previous notes, "Difficult Airway- due to large tongue and Difficult Airway- due to anterior larynx Comments: Used video scope placed bogie and exchange tube over without incidence noted clear bilat lung sounds post intubation."  Pt seen by cardiology 07/22/2022. Per OV note, "The patient affirms she has been doing well without any new cardiac symptoms. They are able to achieve 4 METS without cardiac limitations. Therefore, based on ACC/AHA guidelines, the patient would be at acceptable risk for the planned procedure without further cardiovascular testing. The patient was advised that if she develops new symptoms prior to surgery to contact our office to arrange for a follow-up visit, and she verbalized understanding.    Mary Mcfarland's perioperative risk of a major cardiac event is 0.4% according to the Revised Cardiac Risk Index (RCRI).  Therefore, she is at low risk for perioperative complications.   Her functional capacity is fair at 4.73 METs according to the Duke Activity Status Index (DASI). Recommendations: According to ACC/AHA guidelines, no further cardiovascular testing needed.  The patient may proceed to surgery at acceptable risk.   Antiplatelet and/or Anticoagulation Recommendations:   Eliquis (Apixaban) can be held for 1-2 days prior to surgery.  Please resume post op when felt to be safe."  Anticipate pt  can proceed with planned procedure barring acute status change.   VS: BP (!) 127/58 Comment: right arm sitting  Pulse 68   Temp 37.1 C (Oral)   Resp 18   Ht 5' 6"$  (1.676 m)   Wt 117 kg   LMP 09/24/2013   SpO2 97%   BMI 41.64 kg/m   PROVIDERS: Lawerance Cruel, MD is PCP   Cardiologist - Dr. Buford Dresser  EP: Dr. Lars Mage LABS: Labs reviewed: Acceptable for surgery. (all labs ordered are listed, but only abnormal results are displayed)  Labs Reviewed  HEMOGLOBIN A1C - Abnormal; Notable for the following components:      Result Value   Hgb A1c MFr Bld 6.0 (*)    All other components within normal limits  BASIC METABOLIC PANEL - Abnormal; Notable for the following components:   Glucose, Bld 108 (*)    All other components within normal limits  CBC - Abnormal; Notable for the following components:   WBC 10.9 (*)    All other components within normal limits  GLUCOSE, CAPILLARY - Abnormal; Notable for the following components:   Glucose-Capillary 107 (*)    All other components within normal limits     IMAGES:   EKG:   CV: Myocardial Perfusion 02/28/2020 Lexiscan stress is electrically negative for ischemia Myovue scan shows normal perfusion LVEf 67% This is a low risk study.  Echo 07/01/2019 1. Left ventricular ejection fraction, by visual estimation, is 60 to  65%. The left ventricle has normal function. There is mildly increased  left ventricular hypertrophy.   2. Left ventricular diastolic parameters are indeterminate.   3.  The left ventricle has no regional wall motion abnormalities.   4. Global right ventricle has normal systolic function.The right  ventricular size is normal. No increase in right ventricular wall  thickness.   5. Left atrial size was moderately dilated.   6. Right atrial size was normal.   7. The mitral valve is normal in structure. Trivial mitral valve  regurgitation. No evidence of mitral stenosis.   8. The tricuspid  valve is normal in structure.   9. The tricuspid valve is normal in structure. Tricuspid valve  regurgitation is not demonstrated.  10. The aortic valve is tricuspid. Aortic valve regurgitation is not  visualized. Mild to moderate aortic valve sclerosis/calcification without  any evidence of aortic stenosis.  11. Calcified non coronary cusp.  12. The pulmonic valve was normal in structure. Pulmonic valve  regurgitation is not visualized.  13. The inferior vena cava is normal in size with greater than 50%  respiratory variability, suggesting right atrial pressure of 3 mmHg.  Past Medical History:  Diagnosis Date   Anticoagulant long-term use    eliquis--- managed by cardiology   Asthma, mild persistent    pulmonologist--- dr Rayann Heman;  (04-08-2022  pt stated last rescue inhaler use 2 months ago)   Bipolar 2 disorder ([place])    Cardiac pacemaker in situ 02/06/2020   for tachy-brady syndrome and symptomatic bradycardia;   [place]  (ep cardiologist--- dr Rayann Heman)   Cystocele with rectocele    Difficult intubation 03/2016   04-08-2022  pt stated was scheduled for achilles tendon repair @ [facility] prior to when was done @ [facility] on 10-03-207,  when she woke up was told by nurse the surgery was note done due to Narrow airway and did not have what was needed so surgery was rescheduled (no documentation of any type in epic about this incident) per anesthesia record from 03-15-2016 pt had no issues and pt stated no issues   Diverticulosis of colon    DOE (dyspnea on exertion)    pt stated due to aftrial fib, sob,  is able to do household chores but with long distance walking and stairs get sob   Edema of both lower extremities    GERD (gastroesophageal reflux disease)    History of adenomatous polyp of colon    History of diverticulitis of colon    Hyperlipidemia    Hypertension    Longstanding persistent atrial fibrillation [facility]) 2017   cardiologist-- dr c. Shawna Orleans;  for dx 2017 the  reacurrance 01/ 2021;  s/p DCCV 07-24-2019;  admitted for tikosyn loading 03/ 2021;  sss s/p dual PPM 02-06-2020   MDD (major depressive disorder)    OA (osteoarthritis)    OSA (obstructive sleep apnea)    sleep study in epic 04-06-2021 OSA and nocturnal hypoxemia, per pt was unable to tolerate bipap recommended but does have appointment to get dental appliance   Pelvic prolapse    PONV (postoperative nausea and vomiting)    Pre-diabetes    Sciatica of right side    SSS (sick sinus syndrome) ([place])    Thyroid nodule    followed by pcp;  last ultrasound in epic 12-09-2011 pt stated never had bx done   Vitamin D deficiency    Wears glasses     Past Surgical History:  Procedure Laterality Date   ANTERIOR AND POSTERIOR REPAIR N/A 04/14/2022   Procedure: POSTERIOR REPAIR (RECTOCELE);  Surgeon: Delsa Bern, MD;  Location: [facility];  Service: Gynecology;  Laterality: N/A;   CARDIOVERSION N/A 07/24/2019   Procedure: CARDIOVERSION;  Surgeon: Buford Dresser, MD;  Location: [facility] ENDOSCOPY;  Service: Cardiovascular;  Laterality: N/A;   COLONOSCOPY WITH PROPOFOL  11/2020   dr Therisa Doyne   HEEL SPUR RESECTION Left 03/15/2016   Procedure: ACHILLIES TENDON REPAIR AND PUMP BUMP EXCISION;  Surgeon: Melrose Nakayama, MD;  Location: [place];  Service: Orthopedics;  Laterality: Left;   PACEMAKER IMPLANT N/A 02/06/2020   Procedure: PACEMAKER IMPLANT;  Surgeon: Thompson Grayer, MD;  Location: [place] [facility];  Service: Cardiovascular;  Laterality: N/A;   PERINEOPLASTY N/A 04/14/2022   Procedure: PERINEOPLASTY;  Surgeon: Delsa Bern, MD;  Location: [facility];  Service: Gynecology;  Laterality: N/A;   SHOULDER ARTHROSCOPY Right 11/15/2005   @[facility]$  by dr Rhona Raider   TOE SURGERY Right 2011   bone spur   TONSILLECTOMY  1979    MEDICATIONS:  Cholecalciferol (VITAMIN D3) 50 MCG (2000 UT) capsule   acetaminophen (TYLENOL) 500 MG tablet   albuterol (PROVENTIL HFA;VENTOLIN  HFA) 108 (90 Base) MCG/ACT inhaler   atorvastatin (LIPITOR) 40 MG tablet   buPROPion (WELLBUTRIN SR) 150 MG 12 hr tablet   DILT-XR 120 MG 24 hr capsule   dofetilide (TIKOSYN) 250 MCG capsule   ELIQUIS 5 MG TABS tablet   furosemide (LASIX) 40 MG tablet   lamoTRIgine (LAMICTAL) 100 MG tablet   lisinopril (ZESTRIL) 10 MG tablet   Naphazoline-Pheniramine (OPCON-A) 0.027-0.315 % SOLN   omeprazole (PRILOSEC) 20 MG capsule   oxyCODONE (ROXICODONE) 5 MG immediate release tablet   valACYclovir (VALTREX) 1000 MG tablet   Wheat Dextrin (BENEFIBER) POWD   zolpidem (AMBIEN) 10 MG tablet   No current facility-administered medications for this encounter.     Konrad Felix Ward, PA-C [facility] [phone]

## 2022-07-22 ENCOUNTER — Ambulatory Visit: Payer: HMO | Attending: Cardiology

## 2022-07-22 DIAGNOSIS — Z01810 Encounter for preprocedural cardiovascular examination: Secondary | ICD-10-CM | POA: Diagnosis not present

## 2022-07-25 ENCOUNTER — Encounter: Payer: Self-pay | Admitting: Cardiology

## 2022-07-25 DIAGNOSIS — D72829 Elevated white blood cell count, unspecified: Secondary | ICD-10-CM | POA: Diagnosis not present

## 2022-07-25 DIAGNOSIS — E559 Vitamin D deficiency, unspecified: Secondary | ICD-10-CM | POA: Diagnosis not present

## 2022-07-25 DIAGNOSIS — I10 Essential (primary) hypertension: Secondary | ICD-10-CM | POA: Diagnosis not present

## 2022-07-25 DIAGNOSIS — R7303 Prediabetes: Secondary | ICD-10-CM | POA: Diagnosis not present

## 2022-07-25 DIAGNOSIS — E782 Mixed hyperlipidemia: Secondary | ICD-10-CM | POA: Diagnosis not present

## 2022-07-25 NOTE — Progress Notes (Signed)
PERIOPERATIVE PRESCRIPTION FOR IMPLANTED CARDIAC DEVICE PROGRAMMING  Patient Information: Name:  ZYKIA SUTKOWSKI  DOB:  July 02, 1955  MRN:  [id number]  Planned Procedure:  RIGHT HEEL PUMP BUMP EXCISION AND ACHILLES TENDON REPAIR (Right)  Surgeon:  Dr. Rhona Raider  Date of Procedure:  08/02/2022  Cautery will be used.  Position during surgery:  unknown   Device Information:  Clinic EP Physician:  Dr. Lars Mage  Device Type:  Pacemaker Manufacturer and Phone #:  [employer]: [phone] Pacemaker Dependent?:  No. Date of Last Device Check:  05/09/2022 Normal Device Function?:  Yes.    Electrophysiologist's Recommendations:  Have magnet available. Provide continuous ECG monitoring when magnet is used or reprogramming is to be performed.  Procedure should not interfere with device function.  No device programming or magnet placement needed.  Per Device Clinic Standing Orders, Damian Leavell, RN  12:08 PM 07/25/2022

## 2022-07-25 NOTE — Anesthesia Preprocedure Evaluation (Addendum)
Anesthesia Evaluation  Patient identified by MRN, date of birth, ID band Patient awake    Reviewed: Allergy & Precautions, H&P , NPO status , Patient's Chart, lab work & pertinent test results  History of Anesthesia Complications (+) PONV, DIFFICULT AIRWAY and history of anesthetic complications  Airway Mallampati: III  TM Distance: <3 FB Neck ROM: Full    Dental  (+) Caps, Teeth Intact, Dental Advisory Given   Pulmonary sleep apnea , former smoker   Pulmonary exam normal        Cardiovascular Exercise Tolerance: Good hypertension, Pt. on medications Normal cardiovascular exam+ dysrhythmias Atrial Fibrillation + pacemaker   Myocardial Perfusion 02/28/2020  Lexiscan stress is electrically negative for ischemia  Myovue scan shows normal perfusion  LVEf 67%  This is a low risk study.   Echo 07/01/2019 1. Left ventricular ejection fraction, by visual estimation, is 60 to  65%. The left ventricle has normal function. There is mildly increased  left ventricular hypertrophy.   2. Left ventricular diastolic parameters are indeterminate.   3. The left ventricle has no regional wall motion abnormalities.   4. Global right ventricle has normal systolic function.The right  ventricular size is normal. No increase in right ventricular wall  thickness.   5. Left atrial size was moderately dilated.   6. Right atrial size was normal.   7. The mitral valve is normal in structure. Trivial mitral valve  regurgitation. No evidence of mitral stenosis.   8. The tricuspid valve is normal in structure.   9. The tricuspid valve is normal in structure. Tricuspid valve  regurgitation is not demonstrated.  10. The aortic valve is tricuspid. Aortic valve regurgitation is not  visualized. Mild to moderate aortic valve sclerosis/calcification without  any evidence of aortic stenosis.  11. Calcified non coronary cusp.  12. The pulmonic valve was  normal in structure. Pulmonic valve  regurgitation is not visualized.  13. The inferior vena cava is normal in size with greater than 50%  respiratory variability, suggesting right atrial pressure of 3 mmHg.     Neuro/Psych  PSYCHIATRIC DISORDERS  Depression Bipolar Disorder   negative neurological ROS  negative psych ROS   GI/Hepatic negative GI ROS, Neg liver ROS,,,  Endo/Other  negative endocrine ROS  Morbid obesity  Renal/GU negative Renal ROS  negative genitourinary   Musculoskeletal negative musculoskeletal ROS (+) Arthritis , Osteoarthritis,    Abdominal   Peds negative pediatric ROS (+)  Hematology negative hematology ROS (+)   Anesthesia Other Findings Pt seen by cardiology 07/22/2022. Per OV note, "The patient affirms she has been doing well without any new cardiac symptoms. They are able to achieve 4 METS without cardiac limitations. Therefore, based on ACC/AHA guidelines, the patient would be at acceptable risk for the planned procedure without further cardiovascular testing. The patient was advised that if she develops new symptoms prior to surgery to contact our office to arrange for a follow-up visit, and she verbalized understanding.    [name]'s perioperative risk of a major cardiac event is 0.4% according to the Revised Cardiac Risk Index (RCRI).  Therefore, she is at low risk for perioperative complications.   Her functional capacity is fair at 4.73 METs according to the Duke Activity Status Index (DASI). Recommendations: According to ACC/AHA guidelines, no further cardiovascular testing needed.  The patient may proceed to surgery at acceptable risk.   Antiplatelet and/or Anticoagulation Recommendations:   Eliquis (Apixaban) can be held for 1-2 days prior to surgery.  Please resume  post op when felt to be safe."   Reproductive/Obstetrics negative OB ROS                             Anesthesia Physical Anesthesia Plan  ASA:  3  Anesthesia Plan: General   Post-op Pain Management: Regional block*, Tylenol PO (pre-op)* and Toradol IV (intra-op)*   Induction: Intravenous  PONV Risk Score and Plan: 3 and Ondansetron, Dexamethasone and Treatment may vary due to age or medical condition  Airway Management Planned: Oral ETT and Video Laryngoscope Planned  Additional Equipment: None  Intra-op Plan:   Post-operative Plan:   Informed Consent: I have reviewed the patients History and Physical, chart, labs and discussed the procedure including the risks, benefits and alternatives for the proposed anesthesia with the patient or authorized representative who has indicated his/her understanding and acceptance.     Dental advisory given  Plan Discussed with: Anesthesiologist and CRNA  Anesthesia Plan Comments: (See PAT note 07/20/2022)        Anesthesia Quick Evaluation

## 2022-07-26 DIAGNOSIS — N930 Postcoital and contact bleeding: Secondary | ICD-10-CM | POA: Diagnosis not present

## 2022-07-26 DIAGNOSIS — R9389 Abnormal findings on diagnostic imaging of other specified body structures: Secondary | ICD-10-CM | POA: Diagnosis not present

## 2022-07-27 ENCOUNTER — Ambulatory Visit (HOSPITAL_COMMUNITY): Payer: 59 | Admitting: Licensed Clinical Social Worker

## 2022-07-27 ENCOUNTER — Encounter (HOSPITAL_COMMUNITY): Payer: Self-pay

## 2022-07-27 NOTE — H&P (Signed)
Mary Mcfarland is an 67 y.o. female.   Chief Complaint: Right heel pain HPI: Mary Mcfarland is here today for follow-up of her right foot and heel. Her heel and foot continues to be terribly painful.  It wake her at night.  She has difficulty wearing shoes.  If she bumps it when she goes to get up from the seated position it is extremely painful.   Radiographs:  X-rays that were ordered, performed, and interpreted by me today included 3 views of the right foot demonstrate a large prominent pump bump and retrocalcaneal spur.  No acute bony abnormalities are noted.  Past Medical History:  Diagnosis Date   Anticoagulant long-term use    eliquis--- managed by cardiology   Asthma, mild persistent    pulmonologist--- dr Rayann Heman;  (04-08-2022  pt stated last rescue inhaler use 2 months ago)   Bipolar 2 disorder ([place])    Cardiac pacemaker in situ 02/06/2020   for tachy-brady syndrome and symptomatic bradycardia;   [place]  (ep cardiologist--- dr Rayann Heman)   Cystocele with rectocele    Difficult intubation 03/2016   04-08-2022  pt stated was scheduled for achilles tendon repair @ [facility] prior to when was done @ [facility] on 10-03-207,  when she woke up was told by nurse the surgery was note done due to Narrow airway and did not have what was needed so surgery was rescheduled (no documentation of any type in epic about this incident) per anesthesia record from 03-15-2016 pt had no issues and pt stated no issues   Diverticulosis of colon    DOE (dyspnea on exertion)    pt stated due to aftrial fib, sob,  is able to do household chores but with long distance walking and stairs get sob   Edema of both lower extremities    GERD (gastroesophageal reflux disease)    History of adenomatous polyp of colon    History of diverticulitis of colon    Hyperlipidemia    Hypertension    Longstanding persistent atrial fibrillation [facility]) 2017   cardiologist-- dr c. Shawna Orleans;  for dx 2017 the reacurrance 01/ 2021;   s/p DCCV 07-24-2019;  admitted for tikosyn loading 03/ 2021;  sss s/p dual PPM 02-06-2020   MDD (major depressive disorder)    OA (osteoarthritis)    OSA (obstructive sleep apnea)    sleep study in epic 04-06-2021 OSA and nocturnal hypoxemia, per pt was unable to tolerate bipap recommended but does have appointment to get dental appliance   Pelvic prolapse    PONV (postoperative nausea and vomiting)    Pre-diabetes    Sciatica of right side    SSS (sick sinus syndrome) ([place])    Thyroid nodule    followed by pcp;  last ultrasound in epic 12-09-2011 pt stated never had bx done   Vitamin D deficiency    Wears glasses     Past Surgical History:  Procedure Laterality Date   ANTERIOR AND POSTERIOR REPAIR N/A 04/14/2022   Procedure: POSTERIOR REPAIR (RECTOCELE);  Surgeon: Delsa Bern, MD;  Location: [facility];  Service: Gynecology;  Laterality: N/A;   CARDIOVERSION N/A 07/24/2019   Procedure: CARDIOVERSION;  Surgeon: Buford Dresser, MD;  Location: [facility] ENDOSCOPY;  Service: Cardiovascular;  Laterality: N/A;   COLONOSCOPY WITH PROPOFOL  11/2020   dr Therisa Doyne   HEEL SPUR RESECTION Left 03/15/2016   Procedure: ACHILLIES TENDON REPAIR AND PUMP BUMP EXCISION;  Surgeon: Melrose Nakayama, MD;  Location: [place];  Service:  Orthopedics;  Laterality: Left;   PACEMAKER IMPLANT N/A 02/06/2020   Procedure: PACEMAKER IMPLANT;  Surgeon: Thompson Grayer, MD;  Location: [facility];  Service: Cardiovascular;  Laterality: N/A;   PERINEOPLASTY N/A 04/14/2022   Procedure: PERINEOPLASTY;  Surgeon: Delsa Bern, MD;  Location: [facility];  Service: Gynecology;  Laterality: N/A;   SHOULDER ARTHROSCOPY Right 11/15/2005   @[facility]$  by dr Rhona Raider   TOE SURGERY Right 2011   bone spur   TONSILLECTOMY  1979    Family History  Problem Relation Age of Onset   Heart disease Mother    CVA Mother    Hypertension Mother    Cancer Mother    Stroke Mother    Depression Mother     Bipolar disorder Mother    Obesity Mother    Heart disease Father    Hypertension Father    Heart attack Father    Diabetes Father    Liver disease Father    Alcoholism Father    Drug abuse Father    Obesity Father    Social History:  reports that she quit smoking about 26 years ago. Her smoking use included cigarettes. She has a 22.00 pack-year smoking history. She has never used smokeless tobacco. She reports that she does not currently use alcohol. She reports that she does not use drugs.  Allergies:  Allergies  Allergen Reactions   Niacin Hives    No medications prior to admission.    No results found for this or any previous visit (from the past 48 hour(s)). No results found.  Review of Systems  Musculoskeletal:  Positive for arthralgias.       Right heel  All other systems reviewed and are negative.   Last menstrual period 09/24/2013. Physical Exam Constitutional:      Appearance: Normal appearance.  HENT:     Head: Normocephalic and atraumatic.     Nose: Nose normal.     Mouth/Throat:     Pharynx: Oropharynx is clear.  Eyes:     Extraocular Movements: Extraocular movements intact.  Pulmonary:     Effort: Pulmonary effort is normal.  Abdominal:     Palpations: Abdomen is soft.  Musculoskeletal:     Cervical back: Normal range of motion.     Comments: Examination of the right foot and ankle shows a large prominent pump bump in the posterior aspect of the heel.  She is very tender to palpation in this area.  Good palpable Achilles.  Normal sensation motor function throughout.  Skin is benign.  She is neurovascularly intact distally.    Skin:    General: Skin is warm and dry.  Neurological:     General: No focal deficit present.     Mental Status: She is alert and oriented to person, place, and time.  Psychiatric:        Mood and Affect: Mood normal.        Behavior: Behavior normal.        Thought Content: Thought content normal.        Judgment: Judgment  normal.      Assessment/Plan Assessment:   Right heel retrocalcaneal spur and pump bump injected 2022.  Plan: I once again reviewed the risk of anesthesia, infection, DVT, bleeding related to a right heel retrocalcaneal spur removal and pump bump excision.  She is in agreement and would like to proceed. She knows that she will be nonweightbearing for a month after surgery followed by an additional month  and a boot.  We will get this set up once we have the proper clearances.  She can call with any questions or concerns in the interim.  Larwance Sachs Hanford Lust, PA-C 07/27/2022, 1:47 PM

## 2022-08-01 DIAGNOSIS — E559 Vitamin D deficiency, unspecified: Secondary | ICD-10-CM | POA: Diagnosis not present

## 2022-08-01 DIAGNOSIS — I4891 Unspecified atrial fibrillation: Secondary | ICD-10-CM | POA: Diagnosis not present

## 2022-08-01 DIAGNOSIS — D6869 Other thrombophilia: Secondary | ICD-10-CM | POA: Diagnosis not present

## 2022-08-01 DIAGNOSIS — Z0000 Encounter for general adult medical examination without abnormal findings: Secondary | ICD-10-CM | POA: Diagnosis not present

## 2022-08-01 DIAGNOSIS — I10 Essential (primary) hypertension: Secondary | ICD-10-CM | POA: Diagnosis not present

## 2022-08-01 DIAGNOSIS — E782 Mixed hyperlipidemia: Secondary | ICD-10-CM | POA: Diagnosis not present

## 2022-08-01 DIAGNOSIS — K219 Gastro-esophageal reflux disease without esophagitis: Secondary | ICD-10-CM | POA: Diagnosis not present

## 2022-08-01 DIAGNOSIS — R7303 Prediabetes: Secondary | ICD-10-CM | POA: Diagnosis not present

## 2022-08-01 DIAGNOSIS — Z6841 Body Mass Index (BMI) 40.0 and over, adult: Secondary | ICD-10-CM | POA: Diagnosis not present

## 2022-08-01 DIAGNOSIS — G4700 Insomnia, unspecified: Secondary | ICD-10-CM | POA: Diagnosis not present

## 2022-08-01 DIAGNOSIS — F3181 Bipolar II disorder: Secondary | ICD-10-CM | POA: Diagnosis not present

## 2022-08-02 ENCOUNTER — Other Ambulatory Visit: Payer: Self-pay

## 2022-08-02 ENCOUNTER — Ambulatory Visit (HOSPITAL_COMMUNITY): Payer: HMO | Admitting: Physician Assistant

## 2022-08-02 ENCOUNTER — Encounter (HOSPITAL_COMMUNITY): Payer: Self-pay | Admitting: Orthopaedic Surgery

## 2022-08-02 ENCOUNTER — Ambulatory Visit (HOSPITAL_COMMUNITY): Payer: HMO

## 2022-08-02 ENCOUNTER — Encounter (HOSPITAL_COMMUNITY)
Admission: RE | Disposition: A | Discharge status: Home / Self Care | Payer: Self-pay | Source: Home / Self Care | Attending: Orthopaedic Surgery

## 2022-08-02 ENCOUNTER — Ambulatory Visit (HOSPITAL_BASED_OUTPATIENT_CLINIC_OR_DEPARTMENT_OTHER): Payer: HMO | Admitting: Anesthesiology

## 2022-08-02 ENCOUNTER — Ambulatory Visit (HOSPITAL_COMMUNITY)
Admission: RE | Admit: 2022-08-02 | Discharge: 2022-08-02 | Disposition: A | Discharge status: Home / Self Care | Payer: HMO | Attending: Orthopaedic Surgery | Admitting: Orthopaedic Surgery

## 2022-08-02 DIAGNOSIS — Z6841 Body Mass Index (BMI) 40.0 and over, adult: Secondary | ICD-10-CM | POA: Insufficient documentation

## 2022-08-02 DIAGNOSIS — G8918 Other acute postprocedural pain: Secondary | ICD-10-CM | POA: Diagnosis not present

## 2022-08-02 DIAGNOSIS — M7731 Calcaneal spur, right foot: Secondary | ICD-10-CM | POA: Insufficient documentation

## 2022-08-02 DIAGNOSIS — Z87891 Personal history of nicotine dependence: Secondary | ICD-10-CM | POA: Diagnosis not present

## 2022-08-02 DIAGNOSIS — G4733 Obstructive sleep apnea (adult) (pediatric): Secondary | ICD-10-CM | POA: Insufficient documentation

## 2022-08-02 DIAGNOSIS — I4891 Unspecified atrial fibrillation: Secondary | ICD-10-CM | POA: Insufficient documentation

## 2022-08-02 DIAGNOSIS — I10 Essential (primary) hypertension: Secondary | ICD-10-CM | POA: Diagnosis not present

## 2022-08-02 DIAGNOSIS — M216X1 Other acquired deformities of right foot: Secondary | ICD-10-CM | POA: Diagnosis not present

## 2022-08-02 DIAGNOSIS — Z79899 Other long term (current) drug therapy: Secondary | ICD-10-CM | POA: Insufficient documentation

## 2022-08-02 DIAGNOSIS — R7303 Prediabetes: Secondary | ICD-10-CM

## 2022-08-02 DIAGNOSIS — Z950 Presence of cardiac pacemaker: Secondary | ICD-10-CM | POA: Diagnosis not present

## 2022-08-02 DIAGNOSIS — M7751 Other enthesopathy of right foot: Secondary | ICD-10-CM | POA: Diagnosis not present

## 2022-08-02 DIAGNOSIS — M7661 Achilles tendinitis, right leg: Secondary | ICD-10-CM | POA: Diagnosis not present

## 2022-08-02 HISTORY — PX: ACHILLES TENDON SURGERY: SHX542

## 2022-08-02 LAB — GLUCOSE, CAPILLARY: Glucose-Capillary: 86 mg/dL (ref 70–99)

## 2022-08-02 SURGERY — REPAIR, TENDON, ACHILLES
Anesthesia: General | Laterality: Right

## 2022-08-02 MED ORDER — ROCURONIUM BROMIDE 10 MG/ML (PF) SYRINGE
PREFILLED_SYRINGE | INTRAVENOUS | Status: AC
  Filled 2022-08-02: qty 10

## 2022-08-02 MED ORDER — ONDANSETRON HCL 4 MG/2ML IJ SOLN
INTRAMUSCULAR | Status: DC | PRN
  Administered 2022-08-02: 4 mg via INTRAVENOUS

## 2022-08-02 MED ORDER — FENTANYL CITRATE PF 50 MCG/ML IJ SOSY: 25.0000 ug | PREFILLED_SYRINGE | INTRAMUSCULAR | Status: DC | PRN

## 2022-08-02 MED ORDER — MIDAZOLAM HCL 2 MG/2ML IJ SOLN
1.0000 mg | Freq: Once | INTRAMUSCULAR | Status: AC
  Administered 2022-08-02: 2 mg via INTRAVENOUS

## 2022-08-02 MED ORDER — SCOPOLAMINE 1 MG/3DAYS TD PT72
1.0000 | MEDICATED_PATCH | TRANSDERMAL | Status: DC
  Administered 2022-08-02: 1.5 mg via TRANSDERMAL
  Filled 2022-08-02: qty 1

## 2022-08-02 MED ORDER — CEFAZOLIN SODIUM-DEXTROSE 2-4 GM/100ML-% IV SOLN
2.0000 g | INTRAVENOUS | Status: AC
  Administered 2022-08-02: 2 g via INTRAVENOUS
  Filled 2022-08-02: qty 100

## 2022-08-02 MED ORDER — PHENYLEPHRINE 80 MCG/ML (10ML) SYRINGE FOR IV PUSH (FOR BLOOD PRESSURE SUPPORT)
PREFILLED_SYRINGE | INTRAVENOUS | Status: AC
  Filled 2022-08-02: qty 10

## 2022-08-02 MED ORDER — SUGAMMADEX SODIUM 500 MG/5ML IV SOLN
INTRAVENOUS | Status: AC
  Filled 2022-08-02: qty 5

## 2022-08-02 MED ORDER — DEXAMETHASONE SODIUM PHOSPHATE 10 MG/ML IJ SOLN
INTRAMUSCULAR | Status: DC | PRN
  Administered 2022-08-02: 10 mg via INTRAVENOUS

## 2022-08-02 MED ORDER — AMISULPRIDE (ANTIEMETIC) 5 MG/2ML IV SOLN: 10.0000 mg | Freq: Once | INTRAVENOUS | Status: DC | PRN

## 2022-08-02 MED ORDER — LACTATED RINGERS IV SOLN: INTRAVENOUS | Status: DC

## 2022-08-02 MED ORDER — MIDAZOLAM HCL 2 MG/2ML IJ SOLN
INTRAMUSCULAR | Status: AC
  Filled 2022-08-02: qty 2

## 2022-08-02 MED ORDER — CHLORHEXIDINE GLUCONATE 0.12 % MT SOLN
15.0000 mL | Freq: Once | OROMUCOSAL | Status: AC
  Administered 2022-08-02: 15 mL via OROMUCOSAL

## 2022-08-02 MED ORDER — SUCCINYLCHOLINE CHLORIDE 200 MG/10ML IV SOSY
PREFILLED_SYRINGE | INTRAVENOUS | Status: DC | PRN
  Administered 2022-08-02: 140 mg via INTRAVENOUS

## 2022-08-02 MED ORDER — ORAL CARE MOUTH RINSE: 15.0000 mL | Freq: Once | OROMUCOSAL | Status: AC

## 2022-08-02 MED ORDER — PROMETHAZINE HCL 25 MG/ML IJ SOLN: 6.2500 mg | INTRAMUSCULAR | Status: DC | PRN

## 2022-08-02 MED ORDER — FENTANYL CITRATE PF 50 MCG/ML IJ SOSY
PREFILLED_SYRINGE | INTRAMUSCULAR | Status: AC
  Filled 2022-08-02: qty 2

## 2022-08-02 MED ORDER — FENTANYL CITRATE PF 50 MCG/ML IJ SOSY
50.0000 ug | PREFILLED_SYRINGE | Freq: Once | INTRAMUSCULAR | Status: AC
  Administered 2022-08-02: 100 ug via INTRAVENOUS

## 2022-08-02 MED ORDER — FENTANYL CITRATE (PF) 100 MCG/2ML IJ SOLN
INTRAMUSCULAR | Status: DC | PRN
  Administered 2022-08-02: 100 ug via INTRAVENOUS

## 2022-08-02 MED ORDER — CELECOXIB 200 MG PO CAPS
200.0000 mg | ORAL_CAPSULE | Freq: Once | ORAL | Status: AC
  Administered 2022-08-02: 200 mg via ORAL
  Filled 2022-08-02: qty 1

## 2022-08-02 MED ORDER — ONDANSETRON HCL 4 MG/2ML IJ SOLN
INTRAMUSCULAR | Status: AC
  Filled 2022-08-02: qty 2

## 2022-08-02 MED ORDER — PHENYLEPHRINE 80 MCG/ML (10ML) SYRINGE FOR IV PUSH (FOR BLOOD PRESSURE SUPPORT)
PREFILLED_SYRINGE | INTRAVENOUS | Status: DC | PRN
  Administered 2022-08-02: 160 ug via INTRAVENOUS

## 2022-08-02 MED ORDER — PROPOFOL 10 MG/ML IV BOLUS
INTRAVENOUS | Status: AC
  Filled 2022-08-02: qty 20

## 2022-08-02 MED ORDER — BUPIVACAINE LIPOSOME 1.3 % IJ SUSP
INTRAMUSCULAR | Status: DC | PRN
  Administered 2022-08-02: 10 mL

## 2022-08-02 MED ORDER — ROCURONIUM BROMIDE 10 MG/ML (PF) SYRINGE
PREFILLED_SYRINGE | INTRAVENOUS | Status: DC | PRN
  Administered 2022-08-02: 40 mg via INTRAVENOUS

## 2022-08-02 MED ORDER — LIDOCAINE HCL (PF) 2 % IJ SOLN
INTRAMUSCULAR | Status: AC
  Filled 2022-08-02: qty 5

## 2022-08-02 MED ORDER — FENTANYL CITRATE (PF) 100 MCG/2ML IJ SOLN
INTRAMUSCULAR | Status: AC
  Filled 2022-08-02: qty 2

## 2022-08-02 MED ORDER — DEXAMETHASONE SODIUM PHOSPHATE 10 MG/ML IJ SOLN
INTRAMUSCULAR | Status: AC
  Filled 2022-08-02: qty 1

## 2022-08-02 MED ORDER — SUGAMMADEX SODIUM 200 MG/2ML IV SOLN
INTRAVENOUS | Status: DC | PRN
  Administered 2022-08-02: 400 mg via INTRAVENOUS

## 2022-08-02 MED ORDER — PROPOFOL 10 MG/ML IV BOLUS
INTRAVENOUS | Status: DC | PRN
  Administered 2022-08-02: 150 mg via INTRAVENOUS

## 2022-08-02 MED ORDER — MIDAZOLAM HCL 5 MG/5ML IJ SOLN
INTRAMUSCULAR | Status: DC | PRN
  Administered 2022-08-02: 2 mg via INTRAVENOUS

## 2022-08-02 MED ORDER — BUPIVACAINE HCL (PF) 0.5 % IJ SOLN
INTRAMUSCULAR | Status: DC | PRN
  Administered 2022-08-02: 15 mL

## 2022-08-02 MED ORDER — BUPIVACAINE HCL (PF) 0.5 % IJ SOLN
INTRAMUSCULAR | Status: AC
  Filled 2022-08-02: qty 30

## 2022-08-02 MED ORDER — BUPIVACAINE HCL (PF) 0.5 % IJ SOLN
INTRAMUSCULAR | Status: DC | PRN
  Administered 2022-08-02: 15 mL via PERINEURAL

## 2022-08-02 SURGICAL SUPPLY — 58 items
BANDAGE ESMARK 6X9 LF (GAUZE/BANDAGES/DRESSINGS) ×1 IMPLANT
BLADE SURG 15 STRL LF DISP TIS (BLADE) ×2 IMPLANT
BLADE SURG 15 STRL SS (BLADE) ×2
BNDG CMPR 9X6 STRL LF SNTH (GAUZE/BANDAGES/DRESSINGS) ×1
BNDG ELASTIC 4X5.8 VLCR STR LF (GAUZE/BANDAGES/DRESSINGS) ×1 IMPLANT
BNDG ELASTIC 6X5.8 VLCR STR LF (GAUZE/BANDAGES/DRESSINGS) ×1 IMPLANT
BNDG ESMARK 6X9 LF (GAUZE/BANDAGES/DRESSINGS) ×1
BNDG GAUZE DERMACEA FLUFF 4 (GAUZE/BANDAGES/DRESSINGS) ×1 IMPLANT
BNDG GZE DERMACEA 4 6PLY (GAUZE/BANDAGES/DRESSINGS) ×1
COVER BACK TABLE 60X90IN (DRAPES) ×1 IMPLANT
COVER MAYO STAND STRL (DRAPES) ×1 IMPLANT
CUFF TOURN SGL QUICK 34 (TOURNIQUET CUFF)
CUFF TRNQT CYL 34X4.125X (TOURNIQUET CUFF) IMPLANT
DRAPE EXTREMITY T 121X128X90 (DISPOSABLE) ×1 IMPLANT
DRAPE U-SHAPE 47X51 STRL (DRAPES) ×1 IMPLANT
DRAPE U-SHAPE 76X120 STRL (DRAPES) ×1 IMPLANT
DRSG EMULSION OIL 3X3 NADH (GAUZE/BANDAGES/DRESSINGS) IMPLANT
DURAPREP 26ML APPLICATOR (WOUND CARE) ×1 IMPLANT
ELECT REM PT RETURN 15FT ADLT (MISCELLANEOUS) ×1 IMPLANT
GAUZE 4X4 16PLY ~~LOC~~+RFID DBL (SPONGE) IMPLANT
GAUZE SPONGE 4X4 12PLY STRL (GAUZE/BANDAGES/DRESSINGS) ×1 IMPLANT
GLOVE BIO SURGEON STRL SZ8 (GLOVE) ×2 IMPLANT
GLOVE BIOGEL PI IND STRL 8 (GLOVE) ×2 IMPLANT
GOWN STRL REUS W/ TWL LRG LVL3 (GOWN DISPOSABLE) ×1 IMPLANT
GOWN STRL REUS W/ TWL XL LVL3 (GOWN DISPOSABLE) ×1 IMPLANT
GOWN STRL REUS W/TWL LRG LVL3 (GOWN DISPOSABLE) ×1
GOWN STRL REUS W/TWL XL LVL3 (GOWN DISPOSABLE) ×1
IMPL ACHILLES SPEEDB 4.75X19.1 (Screw) IMPLANT
IMPLANT SYS BIOCOMP ACH SPEED (Anchor) ×1 IMPLANT
KIT BASIN OR (CUSTOM PROCEDURE TRAY) ×1 IMPLANT
NDL 1/2 CIR CATGUT .05X1.09 (NEEDLE) IMPLANT
NDL HYPO 22X1.5 SAFETY MO (MISCELLANEOUS) IMPLANT
NEEDLE 1/2 CIR CATGUT .05X1.09 (NEEDLE) IMPLANT
NEEDLE HYPO 22X1.5 SAFETY MO (MISCELLANEOUS) IMPLANT
NEEDLE SAFETY HYPO 22GAX1.5 (MISCELLANEOUS)
NS IRRIG 1000ML POUR BTL (IV SOLUTION) ×1 IMPLANT
PAD CAST 4YDX4 CTTN HI CHSV (CAST SUPPLIES) ×1 IMPLANT
PADDING CAST COTTON 4X4 STRL (CAST SUPPLIES) ×1
PADDING CAST COTTON 6X4 STRL (CAST SUPPLIES) IMPLANT
PENCIL SMOKE EVACUATOR (MISCELLANEOUS) ×1 IMPLANT
SLEEVE SCD COMPRESS KNEE MED (STOCKING) IMPLANT
SPIKE FLUID TRANSFER (MISCELLANEOUS) IMPLANT
SPLINT PLASTER CAST XFAST 5X30 (CAST SUPPLIES) ×5 IMPLANT
STAPLER VISISTAT 35W (STAPLE) IMPLANT
STOCKINETTE 4X48 STRL (DRAPES) ×1 IMPLANT
SUT ETHIBOND 2 OS 4 DA (SUTURE) IMPLANT
SUT FIBERWIRE #2 38 T-5 BLUE (SUTURE)
SUT PROLENE 3 0 PS 2 (SUTURE) IMPLANT
SUT PROLENE 4 0 PS 2 18 (SUTURE) IMPLANT
SUT VIC AB 0 CT1 27 (SUTURE)
SUT VIC AB 0 CT1 27XBRD ANBCTR (SUTURE) IMPLANT
SUT VIC AB 2-0 SH 27 (SUTURE)
SUT VIC AB 2-0 SH 27XBRD (SUTURE) IMPLANT
SUT VIC AB 3-0 FS2 27 (SUTURE) IMPLANT
SUTURE FIBERWR #2 38 T-5 BLUE (SUTURE) IMPLANT
SYR BULB EAR ULCER 3OZ GRN STR (SYRINGE) ×1 IMPLANT
SYR CONTROL 10ML LL (SYRINGE) IMPLANT
UNDERPAD 30X36 HEAVY ABSORB (UNDERPADS AND DIAPERS) ×1 IMPLANT

## 2022-08-02 NOTE — Anesthesia Procedure Notes (Signed)
Anesthesia Regional Block: Popliteal block   Pre-Anesthetic Checklist: , timeout performed,  Correct Patient, Correct Site, Correct Laterality,  Correct Procedure, Correct Position, site marked,  Risks and benefits discussed,  Surgical consent,  Pre-op evaluation,  At surgeon's request and post-op pain management  Laterality: Right  Prep: chloraprep       Needles:  Injection technique: Single-shot  Needle Type: Echogenic Stimulator Needle          Additional Needles:   Procedures:,,,, ultrasound used (permanent image in chart),,    Narrative:  Start time: 08/02/2022 12:44 PM End time: 08/02/2022 11:54 AM Injection made incrementally with aspirations every 5 mL.  Performed by: Personally  Anesthesiologist: Duane Boston, MD  Additional Notes: A functioning IV was confirmed and monitors were applied.  Sterile prep and drape, hand hygiene and sterile gloves were used.  Negative aspiration and test dose prior to incremental administration of local anesthetic. The patient tolerated the procedure well.Ultrasound  guidance: relevant anatomy identified, needle position confirmed, local anesthetic spread visualized around nerve(s), vascular puncture avoided.  Image printed for medical record.

## 2022-08-02 NOTE — Anesthesia Postprocedure Evaluation (Signed)
Anesthesia Post Note  Patient: Mary Mcfarland  Procedure(s) Performed: RIGHT HEEL PUMP BUMP EXCISION AND ACHILLES TENDON REPAIR (Right)     Patient location during evaluation: PACU Anesthesia Type: General Level of consciousness: sedated Pain management: pain level controlled Vital Signs Assessment: post-procedure vital signs reviewed and stable Respiratory status: spontaneous breathing and respiratory function stable Cardiovascular status: stable Postop Assessment: no apparent nausea or vomiting Anesthetic complications: yes   Encounter Notable Events  Notable Event Outcome Phase Comment  Difficult to intubate - expected  Intraprocedure Filed from anesthesia note documentation.    Last Vitals:  Vitals:   08/02/22 1445 08/02/22 1450  BP: (!) 141/79 (!) 144/73  Pulse: 71 70  Resp: 10 12  Temp:  36.6 C  SpO2: 95% 95%    Last Pain:  Vitals:   08/02/22 1450  TempSrc:   PainSc: 0-No pain                 Javar Eshbach DANIEL

## 2022-08-02 NOTE — Brief Op Note (Signed)
Mary Mcfarland [id number] 08/02/2022   PRE-OP DIAGNOSIS: right achilles insertional spurs  POST-OP DIAGNOSIS: same  PROCEDURE: spur excision and TAR  ANESTHESIA: general  Hessie Dibble   Dictation #:  [id number]

## 2022-08-02 NOTE — Op Note (Unsigned)
Mary Mcfarland, DONLAN MEDICAL RECORD NO: [id number] ACCOUNT NO: [id number] DATE OF BIRTH: [birth year] FACILITY: [facility] LOCATION: [facility] PHYSICIAN: Monico Blitz. Rhona Raider, MD  Operative Report   DATE OF PROCEDURE: 08/02/2022  PREOPERATIVE DIAGNOSES: 1.  Right Achilles insertional spur. 2.  Right heel pump bump.  POSTOPERATIVE DIAGNOSES: 1.  Right Achilles insertional spur. 2.  Right heel pump bump.  PROCEDURES:   1.  Excision right Achilles insertional spur. 2.  Excision pump bump, right. 3.  Right Achilles tendon repair.  ANESTHESIA:  General and block.  ATTENDING SURGEON:  Monico Blitz. Rhona Raider, MD.  ASSISTANT:  Star Depriest.  INDICATIONS FOR PROCEDURE: The patient is a 67 year old woman with a long history of a painful right heel.  This was persisted despite immobilization and various braces and pads.  By x-ray, she has a large Achilles insertional spur and a pump bump.  She  is offered excision of both these areas with repair of the Achilles.  Informed operative consent was obtained after discussion of possible complications including reaction to anesthesia, infection, neurovascular injury, and wound healing problems.  She  is status post an identical procedure on the opposite heel about 7 or 8 years back, which worked out well.  SUMMARY OF FINDINGS AND PROCEDURE:  Under general anesthesia the two aforementioned spurs were localized.  They were removed.  To remove the Achilles insertional spur we had to detach about 60% of the Achilles attachment.  This was then repaired with the  Arthrex suture bridge system.  I used fluoroscopy throughout the case to make appropriate intraoperative decisions and read all these views myself.  The patient was scheduled to go home same day.  DESCRIPTION OF PROCEDURE: The patient was taken to the operating suite where general anesthetic was applied without difficulty.  She was positioned prone with chest rolls used and all bony prominences  appropriately padded.  She was then prepped and  draped in normal sterile fashion.  After the administration of preoperative IV Kefzol and appropriate time-out, the right leg was elevated, exsanguinated and a tourniquet inflated about the calf.  I made a posteromedial longitudinal incision at the  distal portion of the Achilles with dissection down to the heel.  I split the Achilles longitudinally, leaving about 20% of each medial and lateral aspect attached detaching again about 60% of the tendon. Through this, I removed a large Achilles  insertional spur and the pump bump.  These were seemed to be adequately resected on fluoroscopic views, which I read by myself.  The wound was then irrigated.  We recreated a bleeding bed of bone for a tendon Achilles repair.  I placed a medial row of  sutures from the Arthrex set, which were basically SwiveLocks.  These were passed through the tendon and then crisscrossed and oversewn to the distal row of anchors, which also consisted of 2 SwiveLock anchors.  I did take one of the rescue sutures from  the medial row and ran that through the tendon in [place] fashion to further secure the Achilles repair.  Fluoroscopy was again used to confirm adequate resection of the spurs.  I irrigated followed by release of tourniquet.  A small amount of bleeding  was easily controlled with some pressure and Bovie cautery.  I reapproximated subcutaneous tissues with 0 and 2-0 undyed Vicryl followed by skin closure with a running subcuticular stitch of 3-0 Vicryl.  We then applied a sterile dressing followed by  posterior splint of plaster with the ankle in slight dorsiflexion.  Estimated blood loss and intraoperative fluids can be obtained from anesthesia records as can accurate tourniquet time.  DISPOSITION: The patient was extubated in the operating room and taken to recovery room in stable condition.  Plans were for her to go home same day and follow up in the office in less than  week.  I will contact her by phone tonight.   PUS D: 08/02/2022 2:16:22 pm T: 08/02/2022 2:32:00 pm  JOB: [id number]

## 2022-08-02 NOTE — Anesthesia Procedure Notes (Signed)
Procedure Name: Intubation Date/Time: 08/02/2022 12:38 PM  Performed by: Maxwell Caul, CRNAPre-anesthesia Checklist: Patient identified, Emergency Drugs available, Suction available and Patient being monitored Patient Re-evaluated:Patient Re-evaluated prior to induction Oxygen Delivery Method: Circle system utilized Preoxygenation: Pre-oxygenation with 100% oxygen Induction Type: IV induction Ventilation: Mask ventilation without difficulty Laryngoscope Size: Glidescope and 4 Tube type: Oral Tube size: 7.0 mm Number of attempts: 1 Airway Equipment and Method: Stylet Placement Confirmation: ETT inserted through vocal cords under direct vision, positive ETCO2 and breath sounds checked- equal and bilateral Secured at: 21 cm Tube secured with: Tape Dental Injury: Teeth and Oropharynx as per pre-operative assessment  Difficulty Due To: Difficulty was anticipated and Difficult Airway- due to anterior larynx Comments: Elective Glidescope intubation due to prior documentation of difficult airway. DL x1 with Glidescope 4 with grade 2 view. Very large tonsils and anterior larynx as noted in prior intubation note. ETT gently placed and confirmed placement by Dr Tobias Alexander.

## 2022-08-02 NOTE — Transfer of Care (Signed)
Immediate Anesthesia Transfer of Care Note  Patient: LAKYRA ARUNDEL  Procedure(s) Performed: RIGHT HEEL PUMP BUMP EXCISION AND ACHILLES TENDON REPAIR (Right)  Patient Location: PACU  Anesthesia Type:General  Level of Consciousness: awake, alert , and oriented  Airway & Oxygen Therapy: Patient Spontanous Breathing and Patient connected to face mask oxygen  Post-op Assessment: Report given to RN and Post -op Vital signs reviewed and stable  Post vital signs: Reviewed and stable  Last Vitals:  Vitals Value Taken Time  BP    Temp    Pulse 72 08/02/22 1418  Resp 13 08/02/22 1418  SpO2 100 % 08/02/22 1418  Vitals shown include unvalidated device data.  Last Pain:  Vitals:   08/02/22 1200  TempSrc:   PainSc: 0-No pain         Complications:  Encounter Notable Events  Notable Event Outcome Phase Comment  Difficult to intubate - expected  Intraprocedure Filed from anesthesia note documentation.

## 2022-08-02 NOTE — Interval H&P Note (Signed)
History and Physical Interval Note:  08/02/2022 11:54 AM  Mary Mcfarland  has presented today for surgery, with the diagnosis of RIGHT HEEL PUMP BUMP AND ACHILLES SPUR.  The various methods of treatment have been discussed with the patient and family. After consideration of risks, benefits and other options for treatment, the patient has consented to  Procedure(s): [place] (Right) as a surgical intervention.  The patient's history has been reviewed, patient examined, no change in status, stable for surgery.  I have reviewed the patient's chart and labs.  Questions were answered to the patient's satisfaction.     Hessie Dibble

## 2022-08-03 ENCOUNTER — Encounter (HOSPITAL_COMMUNITY): Payer: Self-pay | Admitting: Orthopaedic Surgery

## 2022-08-06 ENCOUNTER — Emergency Department (HOSPITAL_COMMUNITY)
Admission: EM | Admit: 2022-08-06 | Discharge: 2022-08-06 | Disposition: A | Discharge status: Home / Self Care | Payer: HMO | Attending: Emergency Medicine | Admitting: Emergency Medicine

## 2022-08-06 ENCOUNTER — Other Ambulatory Visit: Payer: Self-pay

## 2022-08-06 ENCOUNTER — Emergency Department (HOSPITAL_COMMUNITY): Payer: HMO

## 2022-08-06 ENCOUNTER — Encounter (HOSPITAL_COMMUNITY): Payer: Self-pay | Admitting: Emergency Medicine

## 2022-08-06 DIAGNOSIS — W19XXXA Unspecified fall, initial encounter: Secondary | ICD-10-CM | POA: Diagnosis not present

## 2022-08-06 DIAGNOSIS — M79671 Pain in right foot: Secondary | ICD-10-CM | POA: Diagnosis not present

## 2022-08-06 DIAGNOSIS — M25571 Pain in right ankle and joints of right foot: Secondary | ICD-10-CM | POA: Diagnosis not present

## 2022-08-06 DIAGNOSIS — Z4800 Encounter for change or removal of nonsurgical wound dressing: Secondary | ICD-10-CM | POA: Insufficient documentation

## 2022-08-06 DIAGNOSIS — S99911A Unspecified injury of right ankle, initial encounter: Secondary | ICD-10-CM | POA: Insufficient documentation

## 2022-08-06 DIAGNOSIS — S99921A Unspecified injury of right foot, initial encounter: Secondary | ICD-10-CM | POA: Diagnosis not present

## 2022-08-06 MED ORDER — HYDROMORPHONE HCL 2 MG PO TABS
2.0000 mg | ORAL_TABLET | Freq: Once | ORAL | Status: AC
  Administered 2022-08-06: 2 mg via ORAL
  Filled 2022-08-06: qty 1
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
## 2022-08-06 NOTE — Discharge Instructions (Addendum)
Return for any problem.  Follow up closely with Dr. Rhona Raider.  Continue to use Dilaudid as previously prescribed for pain.  X-rays obtained today do not show broken bones or other significant problem.
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
## 2022-08-09 ENCOUNTER — Ambulatory Visit (INDEPENDENT_AMBULATORY_CARE_PROVIDER_SITE_OTHER): Payer: HMO | Admitting: Licensed Clinical Social Worker

## 2022-08-09 DIAGNOSIS — F5102 Adjustment insomnia: Secondary | ICD-10-CM | POA: Diagnosis not present

## 2022-08-09 DIAGNOSIS — F3181 Bipolar II disorder: Secondary | ICD-10-CM | POA: Diagnosis not present

## 2022-08-09 DIAGNOSIS — F331 Major depressive disorder, recurrent, moderate: Secondary | ICD-10-CM

## 2022-08-09 NOTE — Progress Notes (Signed)
Virtual Visit via Video Note  I connected with Mary Mcfarland on 08/09/22 at  2:00 PM EST by a video enabled telemedicine application and verified that I am speaking with the correct person using two identifiers.  Location: Patient: home Provider: office   I discussed the limitations of evaluation and management by telemedicine and the availability of in person appointments. The patient expressed understanding and agreed to proceed.   I discussed the assessment and treatment plan with the patient. The patient was provided an opportunity to ask questions and all were answered. The patient agreed with the plan and demonstrated an understanding of the instructions.   The patient was advised to call back or seek an in-person evaluation if the symptoms worsen or if the condition fails to improve as anticipated.  I provided 53 minutes of non-face-to-face time during this encounter.   THERAPIST PROGRESS NOTE  Session Time: 2:00 PM to 2:53 PM  Participation Level: Active  Behavioral Response: CasualAlertDysphoric  Type of Therapy: Individual Therapy  Treatment Goals addressed: Learning to say no in relationships, addressing depressive issues, work on being more active and motivated, coping  ProgressTowards Goals: Progressing-worked on symptoms related to recent surgery mishap of different things validation and providing supportive interventions  Interventions: Solution Focused, Strength-based, Supportive, and Other: Coping  Summary: Mary Mcfarland is a 67 y.o. female who presents with life has been a "shit show" for the last week every since the surgery. Daughter having a nervous breakdown suicidal dark place for a long time don't know what to do for her won't seek help and won't take medications. Goes to therapy. Came to aunt's after surgery. Get ready to go on scooter. Slipped off bed onto floor on butt. Had to call fire department to get up. That dad went to bathroom went  back to bed. Then stuff starts with daughter go to [place] to see a woman or help doesn't know what to make of that. In meantime granddaughter here love her dearly need down time. Had more falls ended up in hospital twisted foot luckily no other damage. Feels at wits end aunt was taking care of her but aunt in hospital went in hospital Saturday. Thank goodness sophia uncle's caretaker before passed she is staying with aunt she is doing care-giving thank goodness here when fell and ambulance came. With aunt part of colon removed think infection went to the hospital in hospital since Saturday. Tomorrow surgeon take wrap off and put on boot.there was a small cap under her foot up to heal and then wrapped in ace bandages when fell didn't know if damage. Had to crack cast to make sure there was not damage.Won't be able to walk on boot. Ordered cushy scooter. Now concern it is taller of falling. [name] going to stay with friends was going 1000 percent yesterday doing stuff and not getting energy out. Patient loves to spend time with her but also able to do things. Patient says having the type of breakdown break down can't do anything and don't want people help her. Everything could go wrong went wrong.  Therapist noted cannot blame her being in the situation and then have all this happen when she is a person who likes to take care of herself can see how challenging it can be. Two girlfriends helping. Patient know she has eight weeks and this is week one. Late in afternoon starts to hurt. Popsucker broken needs it to function with phone.Takes her meds every day sometimes wonder if  doing anything but knows will be a problem if stops. Have been emotional this week.  Had surgery 2017 [place] took her for a ride [place] and it was a nice. When aunt comes home spend time together and patient says they are close. Aunt going to have a shoulder replacement on March 15.        Patient at home recovering from surgery and has had  a hard week on top of the surgery.  Therapist provided space and support for patient to talk about thoughts and feelings in session validating patient on the struggle she has had has been a lot of things happened that she has had to deal with.  Things like falling off her scooter her ongoing to the hospital.  Therapist use reframing to encourage patient to take the time to make the most of it in different ways spending time with friends and her aunt focused on positive exam patient's life to include cooking.  Talked about stress of her daughter and therapist really emphasizing need for daughter to get more help given symptoms and feeling daughter needs to be more open minded and try different things although at the same time recognizing cannot control what another person is going to do just try to build insight about making good choices.  Assess helpful for patient to have a supportive and strength-based intervention as she struggled this week. Suicidal/Homicidal: No  Plan: Return again in 4 weeks.2.Focus on stressors, patient talking about thoughts and feelings about various issues in her life to help with coping  Diagnosis: Bipolar 2, major depressive disorder recurrent moderate, adjustment insomnia  Collaboration of Care: Other none needed  Patient/Guardian was advised Release of Information must be obtained prior to any record release in order to collaborate their care with an outside provider. Patient/Guardian was advised if they have not already done so to contact the registration department to sign all necessary forms in order for [place] to release information regarding their care.   Consent: Patient/Guardian gives verbal consent for treatment and assignment of benefits for services provided during this visit. Patient/Guardian expressed understanding and agreed to proceed.   Cordella Register,  08/09/2022

## 2022-08-10 ENCOUNTER — Ambulatory Visit: Payer: HMO

## 2022-08-10 DIAGNOSIS — I495 Sick sinus syndrome: Secondary | ICD-10-CM

## 2022-08-10 DIAGNOSIS — M79671 Pain in right foot: Secondary | ICD-10-CM | POA: Diagnosis not present

## 2022-08-10 LAB — CUP PACEART REMOTE DEVICE CHECK
Battery Remaining Longevity: 76 mo
Battery Remaining Percentage: 77 %
Battery Voltage: 3.01 V
Brady Statistic AP VP Percent: 40 %
Brady Statistic AP VS Percent: 20 %
Brady Statistic AS VP Percent: 14 %
Brady Statistic AS VS Percent: 20 %
Brady Statistic RA Percent Paced: 56 %
Brady Statistic RV Percent Paced: 53 %
Date Time Interrogation Session: 20240228020033
Implantable Lead Connection Status: 753985
Implantable Lead Connection Status: 753985
Implantable Lead Implant Date: 20210826
Implantable Lead Implant Date: 20210826
Implantable Lead Location: 753859
Implantable Lead Location: 753860
Implantable Pulse Generator Implant Date: 20210826
Lead Channel Impedance Value: 480 Ohm
Lead Channel Impedance Value: 490 Ohm
Lead Channel Pacing Threshold Amplitude: 1.25 V
Lead Channel Pacing Threshold Amplitude: 1.25 V
Lead Channel Pacing Threshold Pulse Width: 0.4 ms
Lead Channel Pacing Threshold Pulse Width: 0.5 ms
Lead Channel Sensing Intrinsic Amplitude: 2.9 mV
Lead Channel Sensing Intrinsic Amplitude: 4.6 mV
Lead Channel Setting Pacing Amplitude: 2.5 V
Lead Channel Setting Pacing Amplitude: 2.5 V
Lead Channel Setting Pacing Pulse Width: 0.4 ms
Lead Channel Setting Sensing Sensitivity: 2 mV
Pulse Gen Model: 2272
Pulse Gen Serial Number: 3859348

## 2022-08-15 ENCOUNTER — Telehealth (INDEPENDENT_AMBULATORY_CARE_PROVIDER_SITE_OTHER): Payer: HMO | Admitting: Psychiatry

## 2022-08-15 ENCOUNTER — Encounter (HOSPITAL_COMMUNITY): Payer: Self-pay | Admitting: Psychiatry

## 2022-08-15 DIAGNOSIS — F331 Major depressive disorder, recurrent, moderate: Secondary | ICD-10-CM | POA: Diagnosis not present

## 2022-08-15 DIAGNOSIS — F3181 Bipolar II disorder: Secondary | ICD-10-CM

## 2022-08-15 DIAGNOSIS — F5102 Adjustment insomnia: Secondary | ICD-10-CM | POA: Diagnosis not present

## 2022-08-15 MED ORDER — LAMOTRIGINE 150 MG PO TABS: 150.0000 mg | ORAL_TABLET | Freq: Every day | ORAL | 0 refills | Status: DC

## 2022-08-15 NOTE — Progress Notes (Signed)
[place] Follow up visit   Patient Identification: Mary Mcfarland MRN:  [id number] Date of Evaluation:  08/15/2022 Referral Source: primary care Chief Complaint:  depression follow up , moodiness Visit Diagnosis:    ICD-10-CM   1. Bipolar 2 disorder (HCC)  F31.81     2. MDD (major depressive disorder), recurrent episode, moderate (HCC)  F33.1     3. Adjustment insomnia  F51.02      Virtual Visit via Video Note  I connected with Mary Mcfarland on 08/15/22 at  3:30 PM EST by a video enabled telemedicine application and verified that I am speaking with the correct person using two identifiers.  Location: Patient: home Provider: home office   I discussed the limitations of evaluation and management by telemedicine and the availability of in person appointments. The patient expressed understanding and agreed to proceed.      I discussed the assessment and treatment plan with the patient. The patient was provided an opportunity to ask questions and all were answered. The patient agreed with the plan and demonstrated an understanding of the instructions.   The patient was advised to call back or seek an in-person evaluation if the symptoms worsen or if the condition fails to improve as anticipated.  I provided 15 - 20  minutes of non-face-to-face time during this encounter.     History of Present Illness: Patient is a 67 years old currently single Caucasian female initially referred by primary care physician and cardiology for management and establishment of depression She has been seeing a psychiatrist for many years he closed the practice and she started seeing a psychiatrist at [facility] this year till they closed.     Early visit today, feeling overwhelmed, moody Recent foot surgery and has fallen couple of times Getting moody and edgy easily, called to make early appointmnet  No rash on lamictal,      Aggravating factors; health issues Modifying factors;  grand  kids Severity; feels moody over frustrated   Patient denies drug use or alcohol use     Past Psychiatric History: Depression, possible bipolar  Previous Psychotropic Medications: Yes     Past Medical History:  Past Medical History:  Diagnosis Date   Anticoagulant long-term use    eliquis--- managed by cardiology   Asthma, mild persistent    pulmonologist--- dr Rayann Heman;  (04-08-2022  pt stated last rescue inhaler use 2 months ago)   Bipolar 2 disorder ([place])    Cardiac pacemaker in situ 02/06/2020   for tachy-brady syndrome and symptomatic bradycardia;   [place]  (ep cardiologist--- dr Rayann Heman)   Cystocele with rectocele    Difficult intubation 03/2016   04-08-2022  pt stated was scheduled for achilles tendon repair @ [facility] prior to when was done @ [facility] on 10-03-207,  when she woke up was told by nurse the surgery was note done due to Narrow airway and did not have what was needed so surgery was rescheduled (no documentation of any type in epic about this incident) per anesthesia record from 03-15-2016 pt had no issues and pt stated no issues   Diverticulosis of colon    DOE (dyspnea on exertion)    pt stated due to aftrial fib, sob,  is able to do household chores but with long distance walking and stairs get sob   Edema of both lower extremities    GERD (gastroesophageal reflux disease)    History of adenomatous polyp of colon    History of  diverticulitis of colon    Hyperlipidemia    Hypertension    Longstanding persistent atrial fibrillation [facility]) 2017   cardiologist-- dr c. Shawna Orleans;  for dx 2017 the reacurrance 01/ 2021;  s/p DCCV 07-24-2019;  admitted for tikosyn loading 03/ 2021;  sss s/p dual PPM 02-06-2020   MDD (major depressive disorder)    OA (osteoarthritis)    OSA (obstructive sleep apnea)    sleep study in epic 04-06-2021 OSA and nocturnal hypoxemia, per pt was unable to tolerate bipap recommended but does have appointment to get dental appliance    Pelvic prolapse    PONV (postoperative nausea and vomiting)    Pre-diabetes    Sciatica of right side    SSS (sick sinus syndrome) ([place])    Thyroid nodule    followed by pcp;  last ultrasound in epic 12-09-2011 pt stated never had bx done   Vitamin D deficiency    Wears glasses     Past Surgical History:  Procedure Laterality Date   ACHILLES TENDON SURGERY Right 08/02/2022   Procedure: RIGHT HEEL PUMP BUMP EXCISION AND ACHILLES TENDON REPAIR;  Surgeon: Melrose Nakayama, MD;  Location: [facility];  Service: Orthopedics;  Laterality: Right;   ANTERIOR AND POSTERIOR REPAIR N/A 04/14/2022   Procedure: POSTERIOR REPAIR (RECTOCELE);  Surgeon: Delsa Bern, MD;  Location: [facility];  Service: Gynecology;  Laterality: N/A;   CARDIOVERSION N/A 07/24/2019   Procedure: CARDIOVERSION;  Surgeon: Buford Dresser, MD;  Location: [facility] ENDOSCOPY;  Service: Cardiovascular;  Laterality: N/A;   COLONOSCOPY WITH PROPOFOL  11/2020   dr Therisa Doyne   HEEL SPUR RESECTION Left 03/15/2016   Procedure: ACHILLIES TENDON REPAIR AND PUMP BUMP EXCISION;  Surgeon: Melrose Nakayama, MD;  Location: [facility];  Service: Orthopedics;  Laterality: Left;   PACEMAKER IMPLANT N/A 02/06/2020   Procedure: PACEMAKER IMPLANT;  Surgeon: Thompson Grayer, MD;  Location: [facility] CV LAB;  Service: Cardiovascular;  Laterality: N/A;   PERINEOPLASTY N/A 04/14/2022   Procedure: PERINEOPLASTY;  Surgeon: Delsa Bern, MD;  Location: [facility];  Service: Gynecology;  Laterality: N/A;   SHOULDER ARTHROSCOPY Right 11/15/2005   '@[facility]'$  by dr Rhona Raider   TOE SURGERY Right 2011   bone spur   TONSILLECTOMY  1979    Family Psychiatric History: Mom diagnosed with possible bipolar  Family History:  Family History  Problem Relation Age of Onset   Heart disease Mother    CVA Mother    Hypertension Mother    Cancer Mother    Stroke Mother    Depression Mother    Bipolar disorder Mother    Obesity Mother    Heart  disease Father    Hypertension Father    Heart attack Father    Diabetes Father    Liver disease Father    Alcoholism Father    Drug abuse Father    Obesity Father     Social History:   Social History   Socioeconomic History   Marital status: Divorced    Spouse name: Not on file   Number of children: Not on file   Years of education: Not on file   Highest education level: Not on file  Occupational History   Occupation: retired  Tobacco Use   Smoking status: Former    Packs/day: 1.00    Years: 22.00    Total pack years: 22.00    Types: Cigarettes    Quit date: 02/13/1996    Years since quitting: 26.5   Smokeless tobacco:  Never  Vaping Use   Vaping Use: Never used  Substance and Sexual Activity   Alcohol use: Not Currently    Comment: occassionally   Drug use: Never   Sexual activity: Not Currently  Other Topics Concern   Not on file  Social History Narrative   Lives in [place] alone   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)   Social Determinants of Health   Financial Resource Strain: Not on file  Food Insecurity: Not on file  Transportation Needs: Not on file  Physical Activity: Not on file  Stress: Not on file  Social Connections: Not on file     Allergies:   Allergies  Allergen Reactions   Niacin Hives    Metabolic Disorder Labs: Lab Results  Component Value Date   HGBA1C 6.0 (H) 07/20/2022   MPG 125.5 07/20/2022   No results found for: "PROLACTIN" Lab Results  Component Value Date   CHOL 168 01/14/2020   TRIG 78 01/14/2020   HDL 61 01/14/2020   CHOLHDL 3.1 11/13/2019   LDLCALC 92 01/14/2020   LDLCALC 101 (H) 11/13/2019   Lab Results  Component Value Date   TSH 3.610 01/14/2020    Therapeutic Level Labs: No results found for: "LITHIUM" No results found for: "CBMZ" No results found for: "VALPROATE"  Current Medications: See chart    Psychiatric Specialty Exam: Review of Systems  Cardiovascular:  Negative for chest pain.   Psychiatric/Behavioral:  Positive for agitation and dysphoric mood. Negative for hallucinations.     Last menstrual period 09/24/2013.There is no height or weight on file to calculate BMI.  General Appearance: Casual  Eye Contact:  Fair  Speech:  Normal Rate  Volume:  Normal  Mood: edgy, moody   Affect:  Congruent  Thought Process:  Goal Directed  Orientation:  Full (Time, Place, and Person)  Thought Content:  Rumination  Suicidal Thoughts:  No  Homicidal Thoughts:  No  Memory:  Immediate;   Fair Recent;   Fair  Judgement:  Fair  Insight:  Shallow  Psychomotor Activity:  decreased  Concentration:  Concentration: Fair and Attention Span: Fair  Recall:  [employer] of Knowledge:Fair  Language: Good  Akathisia:  No  Handed:   AIMS (if indicated):  not done  Assets:  Desire for Improvement Housing Social Support  ADL's:  Intact  Cognition: WNL  Sleep:   variable   Screenings: Web designer from 04/05/2022 in [place] at [place] Visit from 01/14/2020 in [facility] Total Score 2 6  PHQ-9 Total Score 8 14      [place] ED from 08/06/2022 in [facility] Emergency Department at [facility] Admission (Discharged) from 08/02/2022 in [place] 62 from 07/20/2022 in [place] No Risk No Risk No Risk       Assessment and Plan: as follows  Prior documentation reviewed  Bipolar 2 ; depressed ; feels over whelmed, edgy and moody, increase lamictal to '150mg'$   No rash, continue therapy to work on stressors   Depression;feeling down, increase lamictal, continue wellbutrin  Insomnia:; did not discuss conerns, discussed to add activities during the day See above for med changed   Fu 1 m or earlier if needed   Merian Capron, MD 3/4/20243:36  PM

## 2022-08-18 ENCOUNTER — Ambulatory Visit (HOSPITAL_COMMUNITY): Payer: HMO | Admitting: Psychiatry

## 2022-08-18 DIAGNOSIS — G4733 Obstructive sleep apnea (adult) (pediatric): Secondary | ICD-10-CM | POA: Diagnosis not present

## 2022-08-20 ENCOUNTER — Other Ambulatory Visit (HOSPITAL_COMMUNITY): Payer: Self-pay | Admitting: Psychiatry

## 2022-08-20 ENCOUNTER — Other Ambulatory Visit (HOSPITAL_BASED_OUTPATIENT_CLINIC_OR_DEPARTMENT_OTHER): Payer: Self-pay | Admitting: Cardiology

## 2022-08-20 ENCOUNTER — Other Ambulatory Visit: Payer: Self-pay | Admitting: Cardiology

## 2022-08-22 NOTE — Telephone Encounter (Signed)
Rx request sent to pharmacy.  

## 2022-09-08 ENCOUNTER — Ambulatory Visit (INDEPENDENT_AMBULATORY_CARE_PROVIDER_SITE_OTHER): Payer: HMO | Admitting: Licensed Clinical Social Worker

## 2022-09-08 DIAGNOSIS — F3181 Bipolar II disorder: Secondary | ICD-10-CM | POA: Diagnosis not present

## 2022-09-08 DIAGNOSIS — F331 Major depressive disorder, recurrent, moderate: Secondary | ICD-10-CM

## 2022-09-08 DIAGNOSIS — F5102 Adjustment insomnia: Secondary | ICD-10-CM

## 2022-09-08 NOTE — Progress Notes (Signed)
Virtual Visit via Video Note  I connected with Mary Mcfarland on 09/08/22 at 10:00 AM EDT by a video enabled telemedicine application and verified that I am speaking with the correct person using two identifiers.  Location: Patient: aunt's house Provider: office   I discussed the limitations of evaluation and management by telemedicine and the availability of in person appointments. The patient expressed understanding and agreed to proceed.   I discussed the assessment and treatment plan with the patient. The patient was provided an opportunity to ask questions and all were answered. The patient agreed with the plan and demonstrated an understanding of the instructions.   The patient was advised to call back or seek an in-person evaluation if the symptoms worsen or if the condition fails to improve as anticipated.  I provided 53 minutes of non-face-to-face time during this encounter.  THERAPIST PROGRESS NOTE  Session Time: 10:00 AM to 10:53 AM  Participation Level: Active  Behavioral Response: CasualAlertappropriate  Type of Therapy: Individual Therapy  Treatment Goals addressed: Learning to say no in relationships, addressing depressive issues, work on being more active and motivated, coping  ProgressTowards Goals: Progressing-patient to start a journal, worked on an exercise of self-awareness use therapy to process thoughts and feelings to help with coping  Interventions: Solution Focused, Strength-based, Supportive, and Other: Coping  Summary: Mary Mcfarland is a 67 y.o. female who presents with doing better doctor increased the medication. One month check up can put partial weight on foot Wants to put boot on and was walking with the walker. [place] again this morning. "Was a good enough fall". This whole thing has been a lot different from the first one. Aunt doing ok has to postpone shoulder surgery. In November removed her colon last time in hospital pain in side  ended up being infection where connected colon wanted her on antibiotics. Patient reports getting better everyday. Has been home for two months wants to get home. Thankful for aunt letting stay here don't know what done if didn't have her. Friends come but not the same at home can hang out in the back and have cocktails at home. Son leaving and moving to [place] happy for them. Everything is gated and where they are is pretty safe. He went into business with two other people what was doing at last company. Will visit them. Paperwork ready to get passport. Daughter is doing better actually on the way to [place] for Easter her Dad and half sister are there. Not close with Dad half sister and her pretty close and her boy's birthday and going for his birthday as well. Patient doesn't get babies for the first time and for grandson first time meeting him. Sherren Mocha is bitter narcissistic person not a happy person. Weird look back on life a whole another life time. It was daughter 88. Dated then got married then finally filed divorce 4 years later been together almost 8 years. He never had relationship with kids went on with Lattie Haw and finally got married. They had Jarrett Soho she is five years younger than Keenan Bachelor she is about 50.  Spend time with friend and auntie. Hard to add activities. Can't walk. Therapist suggested do something nice for herself patient said washed hair. Give people compliments. Get compliments about glasses and say thank you. Discussed her glasses and what they say about her free spirited. 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 Wants to start journaling write thoughts and feelings what is going on in  life. Started a list of concerts been to have gotten 19 years what remembers and then remember another. Loves life concerts neighbor had tickets to [employer] and great [employer] fan and got to see [place].  Explored how she got to journaling. Oracle cards all about energy and positivity how to  bring into your body and into you. Thought used to do journaling think it would be good for you.  Therapist reviewed session and patient said self-love.  Reviewed what do with day watch television could put puzzle together. Has been cooking on scooter. Cook at lot of stuff cooking shrimp tonight. Cooked Salmon that was really good.  Therapist impressed and also felt helpful for patient cooking Therapist provided some support for patient to talk about thoughts and feelings in session focused on patient's strengths that she is creative her outlook has been cooking which is particularly positive as she cannot walk on foot but yet able to do a lot of cooking assess good for mood.  At the same time patient's day-to-day is limited as she points out assess helpful to have therapy session outlet to process her thoughts and feelings also positive that she is seeing this ending soon and ability to have more freedom.  Covered other topics of interest that also help with mood things such as patient loves live concert explored her favorites talked about bands that she likes.  Noted positive patient is going to be journaling will be good tool for working on herself and for therapy.  Therapist shared a self-awareness exercise where you pause take a break noticed what is going on with your thoughts feelings body and ask question why is this coming up now where is this coming from then Choose which is considered your values are what is important to you in the situation and the person you want to be and respond to the situation my acting is your best self would reset your motions with the positive distraction whether it is calling someone you love her listening to soothing music.  Therapist noted with CBT therapy we focused on our thoughts that are causing emotions and then replacing with a more helpful thought a version of this but therapist likes the way this exercise guides self-awareness.  Other suggestions including doing  something nice for herself daily doctor suggestion of doing an activity encouraged patient with a positive activity like doing a puzzle.   Suicidal/Homicidal: No  Plan: Return again in 4 weeks.2.  Review with patient any insights from journaling introduce patient CBT through thoughts and feelings block  Diagnosis: Bipolar 2, major depressive disorder recurrent moderate, adjustment insomnia  Collaboration of Care: Medication Management AEB review of Dr. De Nurse last note  Patient/Guardian was advised Release of Information must be obtained prior to any record release in order to collaborate their care with an outside provider. Patient/Guardian was advised if they have not already done so to contact the registration department to sign all necessary forms in order for [place] to release information regarding their care.   Consent: Patient/Guardian gives verbal consent for treatment and assignment of benefits for services provided during this visit. Patient/Guardian expressed understanding and agreed to proceed.   Cordella Register, [place] 09/08/2022

## 2022-09-12 DIAGNOSIS — K5792 Diverticulitis of intestine, part unspecified, without perforation or abscess without bleeding: Secondary | ICD-10-CM | POA: Diagnosis not present

## 2022-09-12 DIAGNOSIS — Z6841 Body Mass Index (BMI) 40.0 and over, adult: Secondary | ICD-10-CM | POA: Diagnosis not present

## 2022-09-12 NOTE — Progress Notes (Signed)
Remote pacemaker transmission.   

## 2022-09-13 ENCOUNTER — Encounter: Payer: Self-pay | Admitting: Cardiology

## 2022-09-15 ENCOUNTER — Other Ambulatory Visit (HOSPITAL_COMMUNITY): Payer: Self-pay | Admitting: Psychiatry

## 2022-09-23 ENCOUNTER — Telehealth (HOSPITAL_BASED_OUTPATIENT_CLINIC_OR_DEPARTMENT_OTHER): Payer: Self-pay

## 2022-09-23 NOTE — Telephone Encounter (Signed)
   Name: Mary Mcfarland  DOB: 05-30-1956  MRN: [id number]  Primary Cardiologist: Jodelle Red, MD  Chart reviewed as part of pre-operative protocol coverage. Because of [name]'s past medical history and time since last visit, she will require a follow-up in-office visit in order to better assess preoperative cardiovascular risk. Patient is scheduled with Francis Dowse, PA-C on 10/05/2022.  Appointment notes have been updated to reflect preop clearance.  Pre-op covering staff:  - Please contact requesting surgeon's office via preferred method (i.e, phone, fax) to inform them of need for appointment prior to surgery.  This message will also be routed to pharmacy pool for input on holding Xarelto as requested below so that this information is available to the clearing provider at time of patient's appointment.   Carlos Levering, NP  09/23/2022, 12:03 PM

## 2022-09-23 NOTE — Telephone Encounter (Signed)
   Pre-operative Risk Assessment    Patient Name: Mary Mcfarland  DOB: 1956/05/17 MRN: [id number]      Request for Surgical Clearance    Procedure:  Hysteroscopy/Dilation and Curettage  Date of Surgery:  Clearance 10/13/22                                 Surgeon:  Dr. Dois Davenport Rivard Surgeon's Group or Practice Name:  [facility] Phone number:  [phone] Fax number:  [phone]   Type of Clearance Requested:   - Medical  - Pharmacy:  Hold Apixaban (Eliquis) how long?   Type of Anesthesia:   IV sedation   Additional requests/questions:   None  Signed, Jeanene Erb   09/23/2022, 11:31 AM

## 2022-09-23 NOTE — Telephone Encounter (Signed)
Patient with diagnosis of afib on Eliquis for anticoagulation.    Procedure: Hysteroscopy/Dilation and Curettage  Date of procedure: scheduled 11/17/22 in epic, not date listed below  CHA2DS2-VASc Score = 3  This indicates a 3.2% annual risk of stroke. The patient's score is based upon: CHF History: 0 HTN History: 1 Diabetes History: 0 Stroke History: 0 Vascular Disease History: 1 Age Score: 0 Gender Score: 1   CrCl 89mL/min using adj body weight Platelet count 302K  Per office protocol, patient can hold Eliquis for 2 days prior to procedure.    **This guidance is not considered finalized until pre-operative APP has relayed final recommendations.**

## 2022-09-26 ENCOUNTER — Telehealth (HOSPITAL_COMMUNITY): Payer: Self-pay | Admitting: Psychiatry

## 2022-09-26 MED ORDER — LAMOTRIGINE 150 MG PO TABS: 150.0000 mg | ORAL_TABLET | Freq: Every day | ORAL | 0 refills | Status: DC

## 2022-09-26 NOTE — Telephone Encounter (Signed)
Patient called to clarify appointments and medication refill. Stated recent refill order for 90 day supply of lamoTRIgine (LAMICTAL) 150 MG tablet [facility] of to:   [facility] - [place], [place] - [address] (Ph: [phone])   Requests new order for 90 day supply be sent to [facility] instead.   Last ordered: 09/15/2022  Last visit:  08/15/2022  Next visit: 10/10/2022

## 2022-09-26 NOTE — Telephone Encounter (Signed)
Medication management - Telephone call with Aliene Altes, pharmacist at the [facility] on [address] to cancel pt's Lamictal order e-scribed 09/15/22 per verbal instructions from Dr. Gilmore Laroche as pt had these now go to [facility].  Called patient back to inform the prescription was now sent to [facility].  Patient requested Dr. Gilmore Laroche send in a 90 day order instead of a 30 day order due to cost and agreed to send this request back to him today.  Reminded patient of appointment on 10/10/22.

## 2022-09-26 NOTE — Addendum Note (Signed)
Addended by: Thresa Ross on: 09/26/2022 03:02 PM   Modules accepted: Orders

## 2022-09-27 ENCOUNTER — Ambulatory Visit (HOSPITAL_COMMUNITY): Payer: Medicare Other | Admitting: Psychiatry

## 2022-09-27 NOTE — Telephone Encounter (Signed)
Medication management - message left for patient that Dr. Gilmore Laroche had send in a 90 day order, as she requested of her Lamictal to her [facility] and requested patient call back if any issues filling.

## 2022-10-02 NOTE — Progress Notes (Unsigned)
Cardiology Office Note Date:  10/02/2022  Patient ID:  Mary, Mcfarland 1955-12-30, MRN [id number] PCP:  Daisy Floro, MD  Cardiologist:  Dr. Cristal Deer Electrophysiologist: Dr. Lalla Brothers  ***refresh   Chief Complaint: *** pre-op  History of Present Illness: Mary Mcfarland is a 67 y.o. female with history of HTN, HLD, obesity, AFib, SSSx w/PPM  She aw Dr. Lalla Brothers 10/01/21, low AFib burden, stable QTc, device functioning appropriately.  Saw T. Asa Lente, PA-C 07/22/22, for gen cards pre-op pending achilles surgery, felt an acceptable cardiac candidate. Device and anticoagulation have all been addressed   *** tikosyn EKG, labs, list *** symptoms *** burden *** eliquis, bleeding, dose, labs   Device information Abbott dual chamber PPM implanted 02/06/20  AFib/AAD hx Diagnosed 2021 Tikosyn 2021   Past Medical History:  Diagnosis Date   Anticoagulant long-term use    eliquis--- managed by cardiology   Asthma, mild persistent    pulmonologist--- dr Irma Newness;  (04-08-2022  pt stated last rescue inhaler use 2 months ago)   Bipolar 2 disorder (HCC)    Cardiac pacemaker in situ 02/06/2020   for tachy-brady syndrome and symptomatic bradycardia;   [employer]  (ep cardiologist--- dr Johney Frame)   Cystocele with rectocele    Difficult intubation 03/2016   04-08-2022  pt stated was scheduled for achilles tendon repair @ [facility] prior to when was done @ [facility] on 10-03-207,  when she woke up was told by nurse the surgery was note done due to Narrow airway and did not have what was needed so surgery was rescheduled (no documentation of any type in epic about this incident) per anesthesia record from 03-15-2016 pt had no issues and pt stated no issues   Diverticulosis of colon    DOE (dyspnea on exertion)    pt stated due to aftrial fib, sob,  is able to do household chores but with long distance walking and stairs get sob   Edema of both lower extremities    GERD  (gastroesophageal reflux disease)    History of adenomatous polyp of colon    History of diverticulitis of colon    Hyperlipidemia    Hypertension    Longstanding persistent atrial fibrillation [facility]) 2017   cardiologist-- dr c. Hughie Closs;  for dx 2017 the reacurrance 01/ 2021;  s/p DCCV 07-24-2019;  admitted for tikosyn loading 03/ 2021;  sss s/p dual PPM 02-06-2020   MDD (major depressive disorder)    OA (osteoarthritis)    OSA (obstructive sleep apnea)    sleep study in epic 04-06-2021 OSA and nocturnal hypoxemia, per pt was unable to tolerate bipap recommended but does have appointment to get dental appliance   Pelvic prolapse    PONV (postoperative nausea and vomiting)    Pre-diabetes    Sciatica of right side    SSS (sick sinus syndrome) (HCC)    Thyroid nodule    followed by pcp;  last ultrasound in epic 12-09-2011 pt stated never had bx done   Vitamin D deficiency    Wears glasses     Past Surgical History:  Procedure Laterality Date   ACHILLES TENDON SURGERY Right 08/02/2022   Procedure: RIGHT HEEL PUMP BUMP EXCISION AND ACHILLES TENDON REPAIR;  Surgeon: Marcene Corning, MD;  Location: [facility];  Service: Orthopedics;  Laterality: Right;   ANTERIOR AND POSTERIOR REPAIR N/A 04/14/2022   Procedure: POSTERIOR REPAIR (RECTOCELE);  Surgeon: Silverio Lay, MD;  Location: [facility];  Service: Gynecology;  Laterality: N/A;   CARDIOVERSION N/A 07/24/2019   Procedure: CARDIOVERSION;  Surgeon: Jodelle Red, MD;  Location: [facility] ENDOSCOPY;  Service: Cardiovascular;  Laterality: N/A;   COLONOSCOPY WITH PROPOFOL  11/2020   dr Marca Ancona   HEEL SPUR RESECTION Left 03/15/2016   Procedure: ACHILLIES TENDON REPAIR AND PUMP BUMP EXCISION;  Surgeon: Marcene Corning, MD;  Location: [facility] OR;  Service: Orthopedics;  Laterality: Left;   PACEMAKER IMPLANT N/A 02/06/2020   Procedure: PACEMAKER IMPLANT;  Surgeon: Hillis Range, MD;  Location: [facility] INVASIVE CV LAB;  Service: Cardiovascular;   Laterality: N/A;   PERINEOPLASTY N/A 04/14/2022   Procedure: PERINEOPLASTY;  Surgeon: Silverio Lay, MD;  Location: [facility];  Service: Gynecology;  Laterality: N/A;   SHOULDER ARTHROSCOPY Right 11/15/2005   @[facility]  by dr Jerl Santos   TOE SURGERY Right 2011   bone spur   TONSILLECTOMY  1979    Current Outpatient Medications  Medication Sig Dispense Refill   acetaminophen (TYLENOL) 500 MG tablet take 2 tablets po every 6 hours for 5 days then prn-post operative pain 30 tablet 0   albuterol (PROVENTIL HFA;VENTOLIN HFA) 108 (90 Base) MCG/ACT inhaler Inhale 2 puffs into the lungs every 6 (six) hours as needed for wheezing or shortness of breath.     atorvastatin (LIPITOR) 40 MG tablet TAKE ONE TABLET BY MOUTH ONCE DAILY 90 tablet 2   buPROPion (WELLBUTRIN SR) 150 MG 12 hr tablet TAKE TWO TABLETS BY MOUTH ONCE DAILY 180 tablet 0   Cholecalciferol (VITAMIN D3) 50 MCG (2000 UT) capsule Take 2,000 Units by mouth daily.     diltiazem (DILT-XR) 120 MG 24 hr capsule Take 1 capsule (120 mg total) by mouth daily. Please schedule appointment for refills. 90 capsule 0   dofetilide (TIKOSYN) 250 MCG capsule TAKE ONE CAPSULE BY MOUTH TWICE DAILY 60 capsule 0   ELIQUIS 5 MG TABS tablet TAKE ONE TABLET BY MOUTH TWICE DAILY 180 tablet 1   furosemide (LASIX) 40 MG tablet TAKE ONE TABLET BY MOUTH TWICE DAILY. take afternoon DOSE ONLY AS NEEDED (Patient taking differently: Take 80 mg by mouth daily.) 180 tablet 3   lamoTRIgine (LAMICTAL) 150 MG tablet TAKE 1 TABLET BY MOUTH EVERY DAY 90 tablet 0   lamoTRIgine (LAMICTAL) 150 MG tablet Take 1 tablet (150 mg total) by mouth daily. 90 tablet 0   lisinopril (ZESTRIL) 10 MG tablet Take 10 mg by mouth daily.     Naphazoline-Pheniramine (OPCON-A) 0.027-0.315 % SOLN Place 1-2 drops into both eyes 3 (three) times daily as needed (for allergies).     omeprazole (PRILOSEC) 20 MG capsule Take 20 mg by mouth daily before breakfast.     oxyCODONE (ROXICODONE) 5  MG immediate release tablet take 1 tablet po every 4 hours prn for breakthrough post operative pain (Patient not taking: Reported on 07/19/2022) 16 tablet 0   valACYclovir (VALTREX) 1000 MG tablet Take 1 tablet (1,000 mg total) by mouth 3 (three) times daily. (Patient taking differently: Take 1,000 mg by mouth 3 (three) times daily as needed (shingles).) 21 tablet 0   Wheat Dextrin (BENEFIBER) POWD Take 1 Dose by mouth daily. 1 dose = 1 tablespoon     zolpidem (AMBIEN) 10 MG tablet Take 10 mg by mouth at bedtime.     No current facility-administered medications for this visit.    Allergies:   Niacin   Social History:  The patient  reports that she quit smoking about 26 years ago. Her smoking use included cigarettes. She has a  22.00 pack-year smoking history. She has never used smokeless tobacco. She reports that she does not currently use alcohol. She reports that she does not use drugs.   Family History:  The patient's family history includes Alcoholism in her father; Bipolar disorder in her mother; CVA in her mother; Cancer in her mother; Depression in her mother; Diabetes in her father; Drug abuse in her father; Heart attack in her father; Heart disease in her father and mother; Hypertension in her father and mother; Liver disease in her father; Obesity in her father and mother; Stroke in her mother.  ROS:  Please see the history of present illness.    All other systems are reviewed and otherwise negative.   PHYSICAL EXAM:  VS:  LMP 09/24/2013  BMI: There is no height or weight on file to calculate BMI. Well nourished, well developed, in no acute distress HEENT: normocephalic, atraumatic Neck: no JVD, carotid bruits or masses Cardiac:  *** RRR; no significant murmurs, no rubs, or gallops Lungs:  *** CTA b/l, no wheezing, rhonchi or rales Abd: soft, nontender MS: no deformity or *** atrophy Ext: *** no edema Skin: warm and dry, no rash Neuro:  No gross deficits appreciated Psych:  euthymic mood, full affect  *** PPM site is stable, no tethering or discomfort   EKG:  Done today and reviewed by myself shows  ***  Device interrogation done today and reviewed by myself:  ***   07/01/2019: TTE 1. Left ventricular ejection fraction, by visual estimation, is 60 to  65%. The left ventricle has normal function. There is mildly increased  left ventricular hypertrophy.   2. Left ventricular diastolic parameters are indeterminate.   3. The left ventricle has no regional wall motion abnormalities.   4. Global right ventricle has normal systolic function.The right  ventricular size is normal. No increase in right ventricular wall  thickness.   5. Left atrial size was moderately dilated.   6. Right atrial size was normal.   7. The mitral valve is normal in structure. Trivial mitral valve  regurgitation. No evidence of mitral stenosis.   8. The tricuspid valve is normal in structure.   9. The tricuspid valve is normal in structure. Tricuspid valve  regurgitation is not demonstrated.  10. The aortic valve is tricuspid. Aortic valve regurgitation is not  visualized. Mild to moderate aortic valve sclerosis/calcification without  any evidence of aortic stenosis.  11. Calcified non coronary cusp.  12. The pulmonic valve was normal in structure. Pulmonic valve  regurgitation is not visualized.  13. The inferior vena cava is normal in size with greater than 50%  respiratory variability, suggesting right atrial pressure of 3 mmHg.    Recent Labs: 10/15/2021: ALT 18 07/20/2022: BUN 20; Creatinine, Ser 0.85; Hemoglobin 12.1; Platelets 302; Potassium 4.4; Sodium 142  No results found for requested labs within last 365 days.   CrCl cannot be calculated (Patient's most recent lab result is older than the maximum 21 days allowed.).   Wt Readings from Last 3 Encounters:  08/06/22 258 lb (117 kg)  08/02/22 258 lb (117 kg)  07/20/22 258 lb (117 kg)     Other studies  reviewed: Additional studies/records reviewed today include: summarized above  ASSESSMENT AND PLAN:  PPM ***  Persistent Afib CHA2DS2Vasc is 2, on Eliquis, *** appropriately dosed Tikosyn, *** QTc *** % burden  HTN ***   Disposition: F/u with ***  Current medicines are reviewed at length with the patient today.  The patient  did not have any concerns regarding medicines.  Norma Fredrickson, PA-C 10/02/2022 6:51 PM     [facility] [address] [place] [address] [phone] (office)  [phone] (fax)

## 2022-10-03 ENCOUNTER — Telehealth (HOSPITAL_COMMUNITY): Payer: Medicare Other | Admitting: Psychiatry

## 2022-10-03 DIAGNOSIS — M25561 Pain in right knee: Secondary | ICD-10-CM | POA: Diagnosis not present

## 2022-10-05 ENCOUNTER — Encounter: Payer: Self-pay | Admitting: Physician Assistant

## 2022-10-05 ENCOUNTER — Ambulatory Visit (HOSPITAL_COMMUNITY): Payer: HMO | Admitting: Licensed Clinical Social Worker

## 2022-10-05 ENCOUNTER — Ambulatory Visit: Payer: HMO | Attending: Physician Assistant | Admitting: Physician Assistant

## 2022-10-05 VITALS — BP 120/70 | HR 70 | Ht 66.0 in | Wt 283.0 lb

## 2022-10-05 DIAGNOSIS — Z950 Presence of cardiac pacemaker: Secondary | ICD-10-CM

## 2022-10-05 DIAGNOSIS — R0602 Shortness of breath: Secondary | ICD-10-CM | POA: Diagnosis not present

## 2022-10-05 DIAGNOSIS — R079 Chest pain, unspecified: Secondary | ICD-10-CM | POA: Diagnosis not present

## 2022-10-05 DIAGNOSIS — I4819 Other persistent atrial fibrillation: Secondary | ICD-10-CM

## 2022-10-05 LAB — CUP PACEART INCLINIC DEVICE CHECK
Battery Remaining Longevity: 68 mo
Battery Voltage: 3.01 V
Brady Statistic RA Percent Paced: 59 %
Brady Statistic RV Percent Paced: 54 %
Date Time Interrogation Session: 20240424130200
Implantable Lead Connection Status: 753985
Implantable Lead Connection Status: 753985
Implantable Lead Implant Date: 20210826
Implantable Lead Implant Date: 20210826
Implantable Lead Location: 753859
Implantable Lead Location: 753860
Implantable Pulse Generator Implant Date: 20210826
Lead Channel Impedance Value: 450 Ohm
Lead Channel Impedance Value: 487.5 Ohm
Lead Channel Pacing Threshold Amplitude: 1 V
Lead Channel Pacing Threshold Amplitude: 1 V
Lead Channel Pacing Threshold Amplitude: 1.25 V
Lead Channel Pacing Threshold Amplitude: 1.25 V
Lead Channel Pacing Threshold Pulse Width: 0.4 ms
Lead Channel Pacing Threshold Pulse Width: 0.4 ms
Lead Channel Pacing Threshold Pulse Width: 1 ms
Lead Channel Pacing Threshold Pulse Width: 1 ms
Lead Channel Sensing Intrinsic Amplitude: 3.2 mV
Lead Channel Sensing Intrinsic Amplitude: 4.6 mV
Lead Channel Setting Pacing Amplitude: 2.5 V
Lead Channel Setting Pacing Amplitude: 2.5 V
Lead Channel Setting Pacing Pulse Width: 0.4 ms
Lead Channel Setting Sensing Sensitivity: 2 mV
Pulse Gen Model: 2272
Pulse Gen Serial Number: 3859348

## 2022-10-05 MED ORDER — DOFETILIDE 250 MCG PO CAPS
250.0000 ug | ORAL_CAPSULE | Freq: Two times a day (BID) | ORAL | 3 refills | Status: DC
  Filled 2023-04-07: qty 180, 90d supply, fill #0
  Filled 2023-04-10 – 2023-06-29 (×2): qty 180, 90d supply, fill #1
  Filled 2023-07-01: qty 60, 30d supply, fill #1

## 2022-10-05 MED ORDER — METOPROLOL TARTRATE 100 MG PO TABS: 100.0000 mg | ORAL_TABLET | Freq: Once | ORAL | 0 refills | Status: DC

## 2022-10-05 NOTE — Patient Instructions (Addendum)
Medication Instructions:      TAKE ONE DOSE :  METOPROLOL 100 MG 2 HOURS PRIOR TO PROCEDURE   *If you need a refill on your cardiac medications before your next appointment, please call your pharmacy*   Lab Work:   BMET AND MAG TODAY    If you have labs (blood work) drawn today and your tests are completely normal, you will receive your results only by: MyChart Message (if you have MyChart) OR A paper copy in the mail If you have any lab test that is abnormal or we need to change your treatment, we will call you to review the results.   Testing/Procedures: Non-Cardiac CT Angiography (CTA), is a special type of CT scan that uses a computer to produce multi-dimensional views of major blood vessels throughout the body. In CT angiography, a contrast material is injected through an IV to help visualize the blood vessels    Follow-Up: At [facility], you and your health needs are our priority.  As part of our continuing mission to provide you with exceptional heart care, we have created designated Provider Care Teams.  These Care Teams include your primary Cardiologist (physician) and Advanced Practice Providers (APPs -  Physician Assistants and Nurse Practitioners) who all work together to provide you with the care you need, when you need it.  We recommend signing up for the patient portal called "MyChart".  Sign up information is provided on this After Visit Summary.  MyChart is used to connect with patients for Virtual Visits (Telemedicine).  Patients are able to view lab/test results, encounter notes, upcoming appointments, etc.  Non-urgent messages can be sent to your provider as well.   To learn more about what you can do with MyChart, go to [email or web address].    Your next appointment:    4 month(s)  Provider:    You may see Lanier Prude, MD or one of the following Advanced Practice Providers on your designated Care Team:   Francis Dowse, PA-C      Other  Instructions    Your cardiac CT will be scheduled at one of the below locations:   [facility] [address] [place], [place] [address] [phone]  OR  [facility] [address] [place], [place] [address] [phone]  OR   [facility] [address] [place], [place] [address] [phone]  If scheduled at [facility], please arrive at the [facility] and Children[facility] Entrance (Entrance C2) of [facility] 30 minutes prior to test start time. You can use the FREE valet parking offered at entrance C (encouraged to control the heart rate for the test)  Proceed to the [facility] Radiology Department (first floor) to check-in and test prep.  All radiology patients and guests should use entrance C2 at [facility], accessed from [facility], even though the hospital's physical address listed is [address].    If scheduled at [facility] or [facility], please arrive 15 mins early for check-in and test prep.   Please follow these instructions carefully (unless otherwise directed):   On the Night Before the Test: Be sure to Drink plenty of water. Do not consume any caffeinated/decaffeinated beverages or chocolate 12 hours prior to your test. Do not take any antihistamines 12 hours prior to your test.   On the Day of the Test: Drink plenty of water until 1 hour  prior to the test. Do not eat any food 1 hour prior to test. You may take your regular medications prior to the test.  Take metoprolol (Lopressor) two hours prior to test. If you take Furosemide/Hydrochlorothiazide/Spironolactone, please HOLD on the morning of the test. FEMALES- please wear underwire-free bra if available, avoid dresses & tight clothing   After the Test: Drink plenty of water. After receiving IV contrast, you may experience  a mild flushed feeling. This is normal. On occasion, you may experience a mild rash up to 24 hours after the test. This is not dangerous. If this occurs, you can take Benadryl 25 mg and increase your fluid intake. If you experience trouble breathing, this can be serious. If it is severe call 911 IMMEDIATELY. If it is mild, please call our office. If you take any of these medications: Glipizide/Metformin, Avandament, Glucavance, please do not take 48 hours after completing test unless otherwise instructed.  We will call to schedule your test 2-4 weeks out understanding that some insurance companies will need an authorization prior to the service being performed.   For non-scheduling related questions, please contact the cardiac imaging nurse navigator should you have any questions/concerns: Rockwell Alexandria, Cardiac Imaging Nurse Navigator Larey Brick, Cardiac Imaging Nurse Navigator [facility] Direct Office Dial: [phone]   For scheduling needs, including cancellations and rescheduling, please call [place], [phone].

## 2022-10-06 DIAGNOSIS — B309 Viral conjunctivitis, unspecified: Secondary | ICD-10-CM | POA: Diagnosis not present

## 2022-10-06 DIAGNOSIS — J40 Bronchitis, not specified as acute or chronic: Secondary | ICD-10-CM | POA: Diagnosis not present

## 2022-10-06 LAB — MAGNESIUM: Magnesium: 2.4 mg/dL — ABNORMAL HIGH (ref 1.6–2.3)

## 2022-10-06 LAB — BASIC METABOLIC PANEL
BUN/Creatinine Ratio: 34 — ABNORMAL HIGH (ref 12–28)
BUN: 29 mg/dL — ABNORMAL HIGH (ref 8–27)
CO2: 22 mmol/L (ref 20–29)
Calcium: 9.4 mg/dL (ref 8.7–10.3)
Chloride: 103 mmol/L (ref 96–106)
Creatinine, Ser: 0.85 mg/dL (ref 0.57–1.00)
Glucose: 106 mg/dL — ABNORMAL HIGH (ref 70–99)
Potassium: 4.5 mmol/L (ref 3.5–5.2)
Sodium: 139 mmol/L (ref 134–144)
eGFR: 76 mL/min/{1.73_m2} (ref >59)

## 2022-10-10 ENCOUNTER — Telehealth (INDEPENDENT_AMBULATORY_CARE_PROVIDER_SITE_OTHER): Payer: HMO | Admitting: Psychiatry

## 2022-10-10 DIAGNOSIS — F5102 Adjustment insomnia: Secondary | ICD-10-CM | POA: Diagnosis not present

## 2022-10-10 DIAGNOSIS — F3181 Bipolar II disorder: Secondary | ICD-10-CM

## 2022-10-10 DIAGNOSIS — F331 Major depressive disorder, recurrent, moderate: Secondary | ICD-10-CM

## 2022-10-10 MED ORDER — BUPROPION HCL ER (SR) 150 MG PO TB12: 300.0000 mg | ORAL_TABLET | Freq: Every day | ORAL | 0 refills | Status: DC

## 2022-10-10 NOTE — Progress Notes (Signed)
[facility] Follow up visit   Patient Identification: Mary Mcfarland MRN:  [id number] Date of Evaluation:  10/10/2022 Referral Source: primary care Chief Complaint:  depression follow up , moodiness Visit Diagnosis:    ICD-10-CM   1. Bipolar 2 disorder (HCC)  F31.81     2. MDD (major depressive disorder), recurrent episode, moderate (HCC)  F33.1     3. Adjustment insomnia  F51.02     Virtual Visit via Video Note  I connected with Mary Mcfarland on 10/10/22 at  8:30 AM EDT by a video enabled telemedicine application and verified that I am speaking with the correct person using two identifiers.  Location: Patient: home Provider: home office   I discussed the limitations of evaluation and management by telemedicine and the availability of in person appointments. The patient expressed understanding and agreed to proceed.      I discussed the assessment and treatment plan with the patient. The patient was provided an opportunity to ask questions and all were answered. The patient agreed with the plan and demonstrated an understanding of the instructions.   The patient was advised to call back or seek an in-person evaluation if the symptoms worsen or if the condition fails to improve as anticipated.  I provided 15 - 20  minutes of non-face-to-face time during this encounter.     History of Present Illness: Patient is a 67 years old currently single Caucasian female initially referred by primary care physician and cardiology for management and establishment of depression She has been seeing a psychiatrist for many years he closed the practice and she started seeing a psychiatrist at [facility] this year till they closed.    Last visit was feeling moody, edgy, had foot surgery and fallen We increased lamictal, now doing beter, no rash Takes ambien at night and we discussed sleep hygiene      Aggravating factors; health issues Modifying factors;  grand kids Severity;  better   Patient denies drug use or alcohol use     Past Psychiatric History: Depression, possible bipolar  Previous Psychotropic Medications: Yes     Past Medical History:  Past Medical History:  Diagnosis Date   Anticoagulant long-term use    eliquis--- managed by cardiology   Asthma, mild persistent    pulmonologist--- dr Irma Newness;  (04-08-2022  pt stated last rescue inhaler use 2 months ago)   Bipolar 2 disorder (HCC)    Cardiac pacemaker in situ 02/06/2020   for tachy-brady syndrome and symptomatic bradycardia;   [employer]  (ep cardiologist--- dr Johney Frame)   Cystocele with rectocele    Difficult intubation 03/2016   04-08-2022  pt stated was scheduled for achilles tendon repair @ [facility] prior to when was done @ [facility] on 10-03-207,  when she woke up was told by nurse the surgery was note done due to Narrow airway and did not have what was needed so surgery was rescheduled (no documentation of any type in epic about this incident) per anesthesia record from 03-15-2016 pt had no issues and pt stated no issues   Diverticulosis of colon    DOE (dyspnea on exertion)    pt stated due to aftrial fib, sob,  is able to do household chores but with long distance walking and stairs get sob   Edema of both lower extremities    GERD (gastroesophageal reflux disease)    History of adenomatous polyp of colon    History of diverticulitis of colon    Hyperlipidemia  Hypertension    Longstanding persistent atrial fibrillation [facility]) 2017   cardiologist-- dr c. Hughie Closs;  for dx 2017 the reacurrance 01/ 2021;  s/p DCCV 07-24-2019;  admitted for tikosyn loading 03/ 2021;  sss s/p dual PPM 02-06-2020   MDD (major depressive disorder)    OA (osteoarthritis)    OSA (obstructive sleep apnea)    sleep study in epic 04-06-2021 OSA and nocturnal hypoxemia, per pt was unable to tolerate bipap recommended but does have appointment to get dental appliance   Pelvic prolapse    PONV  (postoperative nausea and vomiting)    Pre-diabetes    Sciatica of right side    SSS (sick sinus syndrome) (HCC)    Thyroid nodule    followed by pcp;  last ultrasound in epic 12-09-2011 pt stated never had bx done   Vitamin D deficiency    Wears glasses     Past Surgical History:  Procedure Laterality Date   ACHILLES TENDON SURGERY Right 08/02/2022   Procedure: RIGHT HEEL PUMP BUMP EXCISION AND ACHILLES TENDON REPAIR;  Surgeon: Marcene Corning, MD;  Location: [facility];  Service: Orthopedics;  Laterality: Right;   ANTERIOR AND POSTERIOR REPAIR N/A 04/14/2022   Procedure: POSTERIOR REPAIR (RECTOCELE);  Surgeon: Silverio Lay, MD;  Location: [facility];  Service: Gynecology;  Laterality: N/A;   CARDIOVERSION N/A 07/24/2019   Procedure: CARDIOVERSION;  Surgeon: Jodelle Red, MD;  Location: [facility] ENDOSCOPY;  Service: Cardiovascular;  Laterality: N/A;   COLONOSCOPY WITH PROPOFOL  11/2020   dr Marca Ancona   HEEL SPUR RESECTION Left 03/15/2016   Procedure: ACHILLIES TENDON REPAIR AND PUMP BUMP EXCISION;  Surgeon: Marcene Corning, MD;  Location: [facility] OR;  Service: Orthopedics;  Laterality: Left;   PACEMAKER IMPLANT N/A 02/06/2020   Procedure: PACEMAKER IMPLANT;  Surgeon: Hillis Range, MD;  Location: [facility] INVASIVE CV LAB;  Service: Cardiovascular;  Laterality: N/A;   PERINEOPLASTY N/A 04/14/2022   Procedure: PERINEOPLASTY;  Surgeon: Silverio Lay, MD;  Location: [facility];  Service: Gynecology;  Laterality: N/A;   SHOULDER ARTHROSCOPY Right 11/15/2005   @[facility]  by dr Jerl Santos   TOE SURGERY Right 2011   bone spur   TONSILLECTOMY  1979    Family Psychiatric History: Mom diagnosed with possible bipolar  Family History:  Family History  Problem Relation Age of Onset   Heart disease Mother    CVA Mother    Hypertension Mother    Cancer Mother    Stroke Mother    Depression Mother    Bipolar disorder Mother    Obesity Mother    Heart disease Father     Hypertension Father    Heart attack Father    Diabetes Father    Liver disease Father    Alcoholism Father    Drug abuse Father    Obesity Father     Social History:   Social History   Socioeconomic History   Marital status: Divorced    Spouse name: Not on file   Number of children: Not on file   Years of education: Not on file   Highest education level: Not on file  Occupational History   Occupation: retired  Tobacco Use   Smoking status: Former    Packs/day: 1.00    Years: 22.00    Additional pack years: 0.00    Total pack years: 22.00    Types: Cigarettes    Quit date: 02/13/1996    Years since quitting: 26.6   Smokeless tobacco: Never  Vaping  Use   Vaping Use: Never used  Substance and Sexual Activity   Alcohol use: Not Currently    Comment: occassionally   Drug use: Never   Sexual activity: Not Currently  Other Topics Concern   Not on file  Social History Narrative   Lives in [place] alone   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, clinical)   Social Determinants of Health   Financial Resource Strain: Not on file  Food Insecurity: Not on file  Transportation Needs: Not on file  Physical Activity: Not on file  Stress: Not on file  Social Connections: Not on file     Allergies:   Allergies  Allergen Reactions   Niacin Hives    Metabolic Disorder Labs: Lab Results  Component Value Date   HGBA1C 6.0 (H) 07/20/2022   MPG 125.5 07/20/2022   No results found for: "PROLACTIN" Lab Results  Component Value Date   CHOL 168 01/14/2020   TRIG 78 01/14/2020   HDL 61 01/14/2020   CHOLHDL 3.1 11/13/2019   LDLCALC 92 01/14/2020   LDLCALC 101 (H) 11/13/2019   Lab Results  Component Value Date   TSH 3.610 01/14/2020    Therapeutic Level Labs: No results found for: "LITHIUM" No results found for: "CBMZ" No results found for: "VALPROATE"  Current Medications: See chart    Psychiatric Specialty Exam: Review of Systems  Cardiovascular:  Negative for chest  pain.  Psychiatric/Behavioral:  Negative for dysphoric mood and hallucinations.     Last menstrual period 09/24/2013.There is no height or weight on file to calculate BMI.  General Appearance: Casual  Eye Contact:  Fair  Speech:  Normal Rate  Volume:  Normal  Mood: better  Affect:  Congruent  Thought Process:  Goal Directed  Orientation:  Full (Time, Place, and Person)  Thought Content:  Rumination  Suicidal Thoughts:  No  Homicidal Thoughts:  No  Memory:  Immediate;   Fair Recent;   Fair  Judgement:  Fair  Insight:  Shallow  Psychomotor Activity:  decreased  Concentration:  Concentration: Fair and Attention Span: Fair  Recall:  [employer] of Knowledge:Fair  Language: Good  Akathisia:  No  Handed:   AIMS (if indicated):  not done  Assets:  Desire for Improvement Housing Social Support  ADL's:  Intact  Cognition: WNL  Sleep:   variable   Screenings: Insurance account manager from 04/05/2022 in [facility] Office Visit from 01/14/2020 in [facility] at [facility] Total Score 2 6  PHQ-9 Total Score 8 14      Flowsheet Row ED from 08/06/2022 in [facility] Emergency Department at [facility] Admission (Discharged) from 08/02/2022 in [place] [facility] Pre-Admission Testing 60 from 07/20/2022 in [facility]  C-SSRS RISK CATEGORY No Risk No Risk No Risk       Assessment and Plan: as follows Prior documentation reviewed   Bipolar 2 ; depressed ; better not overwhelmed, continue lamictal  Depression; better conitnue wellbutrin  Insomnia:;discussed sleep hygiene, she may try without ambien in few days to sleep   Fu 60m. r earlier if needed   Thresa Ross, MD 4/29/20248:40 AM

## 2022-10-12 ENCOUNTER — Telehealth: Payer: Self-pay | Admitting: Cardiology

## 2022-10-12 ENCOUNTER — Telehealth: Payer: Self-pay

## 2022-10-12 NOTE — Telephone Encounter (Signed)
Pharmacy please advise on holding Eliquis prior to dilation and curettage scheduled for 11/13/2022. Thank you.

## 2022-10-12 NOTE — Telephone Encounter (Signed)
   Pre-operative Risk Assessment    Patient Name: Mary Mcfarland  DOB: [birth year] MRN: [id number]      Request for Surgical Clearance    Procedure:   Hysteroscopy, Dilatation and Curettage   Date of Surgery:  Clearance 11/13/22                                 Surgeon:  Dr Dois Davenport Rivard Surgeon's Group or Practice Name:  [facility] Phone number:  [phone] Fax number:  [phone]   Type of Clearance Requested:   - Medical  - Pharmacy:  Hold Apixaban (Eliquis)     Type of Anesthesia:   IV Sedation   Additional requests/questions:   N/A  Rondel Baton   10/12/2022, 10:14 AM

## 2022-10-12 NOTE — Telephone Encounter (Signed)
Spoke with elmira and she states the patient hs decided to move the procedure back to June. I requested a new clearance be sent to our office. She voiced understanding.

## 2022-10-12 NOTE — Telephone Encounter (Signed)
Patient with diagnosis of afib on Eliquis for anticoagulation.    Procedure: Hysteroscopy/Dilation and Curettage  Date of procedure:  11/13/22    CHA2DS2-VASc Score = 3   This indicates a 3.2% annual risk of stroke. The patient's score is based upon: CHF History: 0 HTN History: 1 Diabetes History: 0 Stroke History: 0 Vascular Disease History: 1 Age Score: 0 Gender Score: 1     CrCl >90 mL/min ( adj BW - Sr Cr 0.85 10/05/2022) Platelet count 302K    Per office protocol, patient can hold Eliquis for 2 days prior to procedure.    **This guidance is not considered finalized until pre-operative APP has relayed final recommendations.**

## 2022-10-12 NOTE — Telephone Encounter (Signed)
Office calling to f/u on Clearance that was sent on 4/12. Please advise

## 2022-10-12 NOTE — Telephone Encounter (Signed)
   Name: Mary Mcfarland  DOB: 1955-12-05  MRN: [id number]  Primary Cardiologist: Jodelle Red, MD   Preoperative team, please contact this patient and set up a phone call appointment for further preoperative risk assessment. Please obtain consent and complete medication review. Thank you for your help.  I confirm that guidance regarding antiplatelet and oral anticoagulation therapy has been completed and, if necessary, noted below.  Per office protocol, patient can hold Eliquis for 2 days prior to procedure.     Napoleon Form, Leodis Rains, NP 10/12/2022, 4:21 PM [facility]

## 2022-10-12 NOTE — Telephone Encounter (Addendum)
Contacted the patient to schedule a virtual appointment.   Patient declined telehealth visit stating she told the requested provider to cancel the procedure. I explained to the patient that the provider has a date of 11/13/22.patient states is unsure if she is even going to do the procedure because its not a dire need thing. Patient states she is going to contact the requesting providers office and if she decides to have the procedure in the future have that office contact us at that point.

## 2022-10-14 ENCOUNTER — Telehealth (HOSPITAL_COMMUNITY): Payer: Self-pay | Admitting: *Deleted

## 2022-10-14 NOTE — Telephone Encounter (Signed)
Attempted to call patient regarding upcoming cardiac CT appointment. °Left message on voicemail with name and callback number ° °Ophie Burrowes RN Navigator Cardiac Imaging °[facility] °[phone] Office °[phone] Cell ° °

## 2022-10-14 NOTE — Telephone Encounter (Signed)
Reaching out to patient to offer assistance regarding upcoming cardiac imaging study; pt verbalizes understanding of appt date/time, parking situation and where to check in, pre-test NPO status and medications ordered, and verified current allergies; name and call back number provided for further questions should they arise  Ayame Rena RN Navigator Cardiac Imaging [facility] [phone] office [phone] cell  Patient to take 100mg metoprolol tartrate two hours prior to her cardiac CT scan. She is aware to arrive at 3:30pm. 

## 2022-10-17 ENCOUNTER — Ambulatory Visit (HOSPITAL_COMMUNITY)
Admission: RE | Admit: 2022-10-17 | Discharge: 2022-10-17 | Disposition: A | Discharge status: Home / Self Care | Payer: HMO | Source: Ambulatory Visit | Attending: Physician Assistant | Admitting: Physician Assistant

## 2022-10-17 DIAGNOSIS — R079 Chest pain, unspecified: Secondary | ICD-10-CM | POA: Diagnosis not present

## 2022-10-17 DIAGNOSIS — R918 Other nonspecific abnormal finding of lung field: Secondary | ICD-10-CM | POA: Diagnosis not present

## 2022-10-17 DIAGNOSIS — R072 Precordial pain: Secondary | ICD-10-CM

## 2022-10-17 DIAGNOSIS — R0602 Shortness of breath: Secondary | ICD-10-CM | POA: Insufficient documentation

## 2022-10-17 DIAGNOSIS — R0600 Dyspnea, unspecified: Secondary | ICD-10-CM | POA: Diagnosis not present

## 2022-10-17 MED ORDER — NITROGLYCERIN 0.4 MG SL SUBL
SUBLINGUAL_TABLET | SUBLINGUAL | Status: AC
  Filled 2022-10-17: qty 2

## 2022-10-17 MED ORDER — NITROGLYCERIN 0.4 MG SL SUBL
0.8000 mg | SUBLINGUAL_TABLET | SUBLINGUAL | Status: DC | PRN
  Administered 2022-10-17: 0.8 mg via SUBLINGUAL

## 2022-10-17 MED ORDER — IOHEXOL 350 MG/ML SOLN
95.0000 mL | Freq: Once | INTRAVENOUS | Status: AC | PRN
  Administered 2022-10-17: 95 mL via INTRAVENOUS

## 2022-10-17 NOTE — Progress Notes (Signed)
Patient tolerated without distress 

## 2022-10-18 DIAGNOSIS — S86001D Unspecified injury of right Achilles tendon, subsequent encounter: Secondary | ICD-10-CM | POA: Diagnosis not present

## 2022-10-21 ENCOUNTER — Telehealth: Payer: Self-pay | Admitting: Cardiology

## 2022-10-21 DIAGNOSIS — S86001D Unspecified injury of right Achilles tendon, subsequent encounter: Secondary | ICD-10-CM | POA: Diagnosis not present

## 2022-10-21 NOTE — Telephone Encounter (Signed)
Received call transferred from operator and spoke with Ruby at [facility].  She is calling to make provider aware of CT findings  IMPRESSION: Mucoid impaction, peribronchovascular nodularity and subpleural consolidation in the right lower lobe, most indicative of an infectious process. Consider follow-up CT chest without contrast in 3-4 weeks to ensure resolution, as clinically indicated, as malignancy cannot be definitively excluded. These results will be called to the ordering clinician or representative by the Radiologist Assistant, and communication documented in the PACS or [employer].  Results are in chart.

## 2022-10-21 NOTE — Telephone Encounter (Signed)
Spoke with patient aware of results and verballized understanding . Patient will contact Pcp first Moinday morning for appointment to be scheduled to furher look into infections and treatment

## 2022-10-21 NOTE — Telephone Encounter (Signed)
Ruby is calling to report abnormal CT results. Call transferred to triage.

## 2022-10-24 DIAGNOSIS — J189 Pneumonia, unspecified organism: Secondary | ICD-10-CM | POA: Diagnosis not present

## 2022-10-24 DIAGNOSIS — Z950 Presence of cardiac pacemaker: Secondary | ICD-10-CM | POA: Diagnosis not present

## 2022-10-27 ENCOUNTER — Ambulatory Visit (INDEPENDENT_AMBULATORY_CARE_PROVIDER_SITE_OTHER): Payer: HMO | Admitting: Licensed Clinical Social Worker

## 2022-10-27 DIAGNOSIS — F32A Depression, unspecified: Secondary | ICD-10-CM

## 2022-10-27 DIAGNOSIS — F3181 Bipolar II disorder: Secondary | ICD-10-CM

## 2022-10-27 DIAGNOSIS — S86001D Unspecified injury of right Achilles tendon, subsequent encounter: Secondary | ICD-10-CM | POA: Diagnosis not present

## 2022-10-27 DIAGNOSIS — F5102 Adjustment insomnia: Secondary | ICD-10-CM

## 2022-10-27 DIAGNOSIS — F331 Major depressive disorder, recurrent, moderate: Secondary | ICD-10-CM

## 2022-10-27 NOTE — Progress Notes (Signed)
Virtual Visit via Video Note  I connected with Mary Mcfarland on 10/27/22 at 10:00 AM EDT by a video enabled telemedicine application and verified that I am speaking with the correct person using two identifiers.  Location: Patient: home Provider: home office   I discussed the limitations of evaluation and management by telemedicine and the availability of in person appointments. The patient expressed understanding and agreed to proceed.   I discussed the assessment and treatment plan with the patient. The patient was provided an opportunity to ask questions and all were answered. The patient agreed with the plan and demonstrated an understanding of the instructions.   The patient was advised to call back or seek an in-person evaluation if the symptoms worsen or if the condition fails to improve as anticipated.  I provided 50 minutes of non-face-to-face time during this encounter.  THERAPIST PROGRESS NOTE  Session Time: 10:00 AM to 10:50 AM  Participation Level: Active  Behavioral Response: CasualAlertEuthymic  Type of Therapy: Individual Therapy  Treatment Goals addressed:  Learning to say no in relationships, addressing depressive issues, work on being more active and motivated, coping  ProgressTowards Goals: Progressing-patient is processing thoughts and feelings, discuss issues help with coping she also started journaling, working on focusing on positive,   Interventions: Solution Focused, Strength-based, Supportive, and Other: coping  Summary: Mary Mcfarland is a 67 y.o. female who presents with last visit was right after surgery. Doing physical therapy can put weight on foot. Glad to be home. Just had 77 birthday and it was good. Went to the [employer] the music was too loud. Lobster wasn't cooked the manager made it right but very under-whelmed and before had been excited to go there. She went with aunt, daughter and husband. Girlfriends took her to breakfast the  next day. Getting around a lot. Has a boot when at home don't have to wear around the house doctor wants her to wear when out and about. Only for another two weeks has to wear. Procedure was getting the bone spur that goes into Achille's it is a rough recovery had it done 2018 this one worse fell 4 times. Didn't have bone density checked. Need to look into that. Had this thing going on sick and then well back and forth. When got pacemaker was going to have CT was in A-fib so could not do it couldn't read it right but patient still out of breath all the time. Just saw cardiologist and said out of breat all the time. Had a CT done everything good no calcium build up. However, she is fighting something in chest that is what it has been. Wanted her to see primary. Went to him started on antibiotic and order a CT on lungs. Feels ok. Has had antibiotics new one hopes it kicks in. Started journal. Doing her recovery what doing and how feeling. Trying to be positive try to be kind. This just happened a couple of weeks ago daughter called Saturday morning still at aunt's daughter crying and told patient that have sit down. A good friend committed suicide the crazy thing patient and him were texting and he said was a grandfather. Send a picture of him and grandson. He is married they were the love of each other's life she has a son and she has three sons. Knows he struggled with depression didn't know how bad it was.  Hard to understand never been in that dark place and never thought of suicide as an  answer. Wife reached out a week ago to let her know. They are going to do a celebration of life in June. She said so sad and overwhelmed by it all didn't want to talk didn't want to be judged patient said never would judge. She found him think know wouldn't do in home. You don't know what is going on with people so be kind to people. Talked about our mutual interest in [employer]. Worried about daughter she talks about  suicide she has seen some dark places. She has a therapist. They went to [place]  the person she saw works on the body and all the bad stuff. She came home and had bruises she seems to be better. She wants to use natural things, into essential oils. Patient has diverticulitis trouble with stomach. Put 12 drops of oregano and had her take it. Did make her feel better. Has vapes helps for example if allergies lemon, lavender.  Talking about sleeping issues alternative to Ambien doctor says meditate and warm milk won't work brain doesn't slow. Would love to do meditation has done meditation did feel better did in morning when wake keep her in a better headspace. Would like to get back into it. Therapist noted would help with emotional regulation, well-being .         Therapist reviewed symptoms any significant events since last seen in March positive patient is recovering from surgery was a hard recovery getting around places assess helpful for her mood.  She has been using her journal this is very positive to make progress in therapy it is a form of doing her own therapy patient also says trying to be positive another coping strategies she is implementing to work on making progress on her treatment goals.  Conversation covered things like mutual interest in [employer] positive for both therapist and patient to talk about therapist pointed out reason to have joy in her life when there are people like him that live interesting life since share with other people.  Subject came to suicide friend of hers discussed the difficulty of having a friend die this way and not really knowing there in so much pain never having experienced that kind of pain is patient points out does not get that dark and would not consider suicide.  Therapist agreed can be difficult to work within therapy shared something she read that people suffered a LOC sometimes can be the best help who understand what a person is going through.   Therapist mention mindfulness will explore further talked about its connection with Buddhism developing right view detachment that helps with regulation of thoughts and feelings  Suicidal/Homicidal: No  Plan: Return again in 4-5weeks. 2.Review with patient any insights from journaling introduce patient CBT through thoughts and feelings block  Diagnosis:  Review with patient any insights from journaling introduce patient CBT through thoughts and feelings block  Collaboration of Care: Other none needed  Patient/Guardian was advised Release of Information must be obtained prior to any record release in order to collaborate their care with an outside provider. Patient/Guardian was advised if they have not already done so to contact the registration department to sign all necessary forms in order for [place] to release information regarding their care.   Consent: Patient/Guardian gives verbal consent for treatment and assignment of benefits for services provided during this visit. Patient/Guardian expressed understanding and agreed to proceed.   Coolidge Breeze, LCSW 10/27/2022

## 2022-11-01 DIAGNOSIS — S86001D Unspecified injury of right Achilles tendon, subsequent encounter: Secondary | ICD-10-CM | POA: Diagnosis not present

## 2022-11-01 NOTE — Progress Notes (Signed)
Left message with office about patient cancelling surgery. Per note from cardiology pt stated she was canceling procedure. Awaiting call back.

## 2022-11-02 ENCOUNTER — Other Ambulatory Visit: Payer: Self-pay | Admitting: Family Medicine

## 2022-11-02 DIAGNOSIS — J189 Pneumonia, unspecified organism: Secondary | ICD-10-CM

## 2022-11-03 DIAGNOSIS — S86001D Unspecified injury of right Achilles tendon, subsequent encounter: Secondary | ICD-10-CM | POA: Diagnosis not present

## 2022-11-04 DIAGNOSIS — M1990 Unspecified osteoarthritis, unspecified site: Secondary | ICD-10-CM | POA: Diagnosis not present

## 2022-11-09 ENCOUNTER — Ambulatory Visit (INDEPENDENT_AMBULATORY_CARE_PROVIDER_SITE_OTHER): Payer: HMO

## 2022-11-09 DIAGNOSIS — I495 Sick sinus syndrome: Secondary | ICD-10-CM

## 2022-11-10 ENCOUNTER — Ambulatory Visit (INDEPENDENT_AMBULATORY_CARE_PROVIDER_SITE_OTHER): Payer: HMO | Admitting: Licensed Clinical Social Worker

## 2022-11-10 ENCOUNTER — Encounter (HOSPITAL_COMMUNITY): Payer: Self-pay

## 2022-11-10 DIAGNOSIS — F3181 Bipolar II disorder: Secondary | ICD-10-CM

## 2022-11-10 DIAGNOSIS — F5102 Adjustment insomnia: Secondary | ICD-10-CM

## 2022-11-10 LAB — CUP PACEART REMOTE DEVICE CHECK
Battery Remaining Longevity: 62 mo
Battery Remaining Percentage: 74 %
Battery Voltage: 3.01 V
Brady Statistic AP VP Percent: 40 %
Brady Statistic AP VS Percent: 27 %
Brady Statistic AS VP Percent: 14 %
Brady Statistic AS VS Percent: 14 %
Brady Statistic RA Percent Paced: 63 %
Brady Statistic RV Percent Paced: 54 %
Date Time Interrogation Session: 20240529065828
Implantable Lead Connection Status: 753985
Implantable Lead Connection Status: 753985
Implantable Lead Implant Date: 20210826
Implantable Lead Implant Date: 20210826
Implantable Lead Location: 753859
Implantable Lead Location: 753860
Implantable Pulse Generator Implant Date: 20210826
Lead Channel Impedance Value: 410 Ohm
Lead Channel Impedance Value: 460 Ohm
Lead Channel Pacing Threshold Amplitude: 0.75 V
Lead Channel Pacing Threshold Amplitude: 1 V
Lead Channel Pacing Threshold Pulse Width: 0.4 ms
Lead Channel Pacing Threshold Pulse Width: 1 ms
Lead Channel Sensing Intrinsic Amplitude: 2.1 mV
Lead Channel Sensing Intrinsic Amplitude: 4.1 mV
Lead Channel Setting Pacing Amplitude: 2.5 V
Lead Channel Setting Pacing Amplitude: 2.5 V
Lead Channel Setting Pacing Pulse Width: 0.4 ms
Lead Channel Setting Sensing Sensitivity: 2 mV
Pulse Gen Model: 2272
Pulse Gen Serial Number: 3859348

## 2022-11-10 NOTE — Progress Notes (Signed)
Patient had late cancel 

## 2022-11-14 ENCOUNTER — Other Ambulatory Visit: Payer: Self-pay | Admitting: Cardiology

## 2022-11-14 ENCOUNTER — Other Ambulatory Visit (HOSPITAL_BASED_OUTPATIENT_CLINIC_OR_DEPARTMENT_OTHER): Payer: Self-pay | Admitting: Cardiology

## 2022-11-14 DIAGNOSIS — R0609 Other forms of dyspnea: Secondary | ICD-10-CM

## 2022-11-14 DIAGNOSIS — I480 Paroxysmal atrial fibrillation: Secondary | ICD-10-CM

## 2022-11-14 DIAGNOSIS — R600 Localized edema: Secondary | ICD-10-CM

## 2022-11-14 NOTE — Telephone Encounter (Signed)
Prescription refill request for Eliquis received. Indication:afib Last office visit:4/24 Scr:0.85 Age: 67 Weight:128.4  kg  Prescription refilled

## 2022-11-16 DIAGNOSIS — Z6841 Body Mass Index (BMI) 40.0 and over, adult: Secondary | ICD-10-CM | POA: Diagnosis not present

## 2022-11-16 DIAGNOSIS — G4700 Insomnia, unspecified: Secondary | ICD-10-CM | POA: Diagnosis not present

## 2022-11-16 DIAGNOSIS — R198 Other specified symptoms and signs involving the digestive system and abdomen: Secondary | ICD-10-CM | POA: Diagnosis not present

## 2022-11-16 DIAGNOSIS — R058 Other specified cough: Secondary | ICD-10-CM | POA: Diagnosis not present

## 2022-11-17 ENCOUNTER — Ambulatory Visit (HOSPITAL_BASED_OUTPATIENT_CLINIC_OR_DEPARTMENT_OTHER): Admit: 2022-11-17 | Payer: HMO | Admitting: Obstetrics and Gynecology

## 2022-11-17 SURGERY — DILATATION AND CURETTAGE /HYSTEROSCOPY
Anesthesia: Monitor Anesthesia Care

## 2022-11-21 DIAGNOSIS — S86001D Unspecified injury of right Achilles tendon, subsequent encounter: Secondary | ICD-10-CM | POA: Diagnosis not present

## 2022-11-24 ENCOUNTER — Ambulatory Visit (INDEPENDENT_AMBULATORY_CARE_PROVIDER_SITE_OTHER): Payer: HMO | Admitting: Licensed Clinical Social Worker

## 2022-11-24 DIAGNOSIS — F5102 Adjustment insomnia: Secondary | ICD-10-CM

## 2022-11-24 DIAGNOSIS — F331 Major depressive disorder, recurrent, moderate: Secondary | ICD-10-CM

## 2022-11-24 DIAGNOSIS — F3181 Bipolar II disorder: Secondary | ICD-10-CM | POA: Diagnosis not present
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
## 2022-11-28 ENCOUNTER — Other Ambulatory Visit (HOSPITAL_BASED_OUTPATIENT_CLINIC_OR_DEPARTMENT_OTHER): Payer: Self-pay | Admitting: Cardiology

## 2022-11-29 ENCOUNTER — Encounter (HOSPITAL_BASED_OUTPATIENT_CLINIC_OR_DEPARTMENT_OTHER): Payer: Self-pay

## 2022-11-29 DIAGNOSIS — S86001D Unspecified injury of right Achilles tendon, subsequent encounter: Secondary | ICD-10-CM | POA: Diagnosis not present

## 2022-11-29 MED ORDER — DILTIAZEM HCL ER 120 MG PO CP24: 120.0000 mg | ORAL_CAPSULE | Freq: Every day | ORAL | 1 refills | Status: DC

## 2022-11-29 NOTE — Telephone Encounter (Signed)
Patient is scheduled 01/31/23 with Dr. Cristal Deer

## 2022-11-29 NOTE — Telephone Encounter (Signed)
Please call pt to schedule overdue 1 year follow-up with Dr. Cristal Deer or APP for refills. Last OV 08/2021. Thank you!

## 2022-12-01 NOTE — Progress Notes (Signed)
Remote pacemaker transmission.   

## 2022-12-05 ENCOUNTER — Ambulatory Visit (HOSPITAL_COMMUNITY): Payer: HMO | Admitting: Licensed Clinical Social Worker

## 2022-12-06 ENCOUNTER — Ambulatory Visit
Admission: RE | Admit: 2022-12-06 | Discharge: 2022-12-06 | Disposition: A | Discharge status: Home / Self Care | Payer: HMO | Source: Ambulatory Visit | Attending: Family Medicine | Admitting: Family Medicine

## 2022-12-06 DIAGNOSIS — R059 Cough, unspecified: Secondary | ICD-10-CM | POA: Diagnosis not present

## 2022-12-06 DIAGNOSIS — R918 Other nonspecific abnormal finding of lung field: Secondary | ICD-10-CM | POA: Diagnosis not present

## 2022-12-06 DIAGNOSIS — S86001D Unspecified injury of right Achilles tendon, subsequent encounter: Secondary | ICD-10-CM | POA: Diagnosis not present

## 2022-12-06 DIAGNOSIS — J189 Pneumonia, unspecified organism: Secondary | ICD-10-CM

## 2022-12-07 DIAGNOSIS — M1711 Unilateral primary osteoarthritis, right knee: Secondary | ICD-10-CM | POA: Diagnosis not present

## 2022-12-09 ENCOUNTER — Ambulatory Visit (INDEPENDENT_AMBULATORY_CARE_PROVIDER_SITE_OTHER): Payer: HMO | Admitting: Licensed Clinical Social Worker

## 2022-12-09 DIAGNOSIS — F3181 Bipolar II disorder: Secondary | ICD-10-CM

## 2022-12-09 DIAGNOSIS — F331 Major depressive disorder, recurrent, moderate: Secondary | ICD-10-CM

## 2022-12-09 DIAGNOSIS — F5102 Adjustment insomnia: Secondary | ICD-10-CM

## 2022-12-09 NOTE — Progress Notes (Signed)
Virtual Visit via Video Note  I connected with Mary Mcfarland on 12/09/22 at 10:00 AM EDT by a video enabled telemedicine application and verified that I am speaking with the correct person using two identifiers.  Location: Patient: home Provider: home office   I discussed the limitations of evaluation and management by telemedicine and the availability of in person appointments. The patient expressed understanding and agreed to proceed.  I discussed the assessment and treatment plan with the patient. The patient was provided an opportunity to ask questions and all were answered. The patient agreed with the plan and demonstrated an understanding of the instructions.   The patient was advised to call back or seek an in-person evaluation if the symptoms worsen or if the condition fails to improve as anticipated.  I provided 45 minutes of non-face-to-face time during this encounter.  THERAPIST PROGRESS NOTE  Session Time: 10:00 AM to 10:45 AM  Participation Level: Active  Behavioral Response: CasualAlertEuthymic  Type of Therapy: Individual Therapy  Treatment Goals addressed: learning to say no in relationships, addressing depressive issues, work on being more active and motivated, coping   ProgressTowards Goals: Progressing-patient processing through recent difficult experiences with loss of friend at the same time noting patient moving forward with goals of being proactive so signs of progress  Interventions: Solution Focused, Strength-based, Supportive, and Other: Coping  Summary: Mary Mcfarland is a 67 y.o. female who presents with talked about diverticulosis feels awful sitting there in cold sweat it is crazy, doesn't know why hands itching doesn't know and then goes away.  Therapist could relate and empathize dealing with her own medical issues noting it helps in a way to have these experiences to better validate and relate.  Patient shared friend who committed  suicide in April went last week celebration of life it was rough but beautiful. Remember looking at the table looking at cornfields. Asking herself why here he is not here. Mind boggling just became a grandfather. Patient said we don't know what it like to have that depression didn't how deep and dark. Don't know it seems they have some beautiful things in life but obviously other things going on. His father spoke and said mind blown why here and he is not here same feelings as patient. All these people were there for him patient and daughter could feel his presence.  Don't realize they are struggling.  Discussed travel and how much that adds to life. Father-in-law he was [place] did things together "he was hoot". He passed away 2022-12-29. He and mother-in-law a great part of patient's life very close to them. Awesome people. First met Tawanna Cooler kid's dad in [place] stepdad and mom bankers affluent entertain and was wonderful  her grandfather is from [place], not blood but only grandpa. Patient's dad was her stepdad. Treated her like grandchild. Grew up in [place], [place] small city within [place] predominately white and [place] where grew up[place] Graduated 1977. Things different back then. Patient wanted to go to [employer] mom single mom didn't know about grants or about college. Went to work [employer] area wanted to get out of [employer] to [facility] and raised kids. Fortunate to move out of [place] and move to [place] [facility]. Aunt and Uncle never had kids asked her to move in and don't have to pay rent when aunt transferred job she ended up getting HR position they ended up buying a home aunt stayed at apartment while looking for  a place to stay. 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 "A no brainer" Therapist asked what was it like living there. Didn't date didn't bring anybody there. Chose to give that up have a great place to live. There were  good things as well.  [employer] saw last week and was wonderful. Grew up next to them they lived in [place]. One of bestfriends went to school for them. Patient was roommates for five years. Talked to Scot Dock the singer said from [facility]. Patient no desire to go back there but home there. Videos of places she knows been to got her emotional. Different music brings up things from her memories as well.      .  .      Therapist reviewed different issues with patient could validate and relate to things such as struggles with medical issues therapist noted experiencing them does help to understand and empathize and patient could appreciate that.  And went to a service for who had committed suicide last week and her feelings of not realizing what a deep dark place not knowing when people are at that point and not understanding why we are still here and there not.  Therapist relating to this as well from her experience things come up in life their mind you do not want to miss those things deprive yourself with those opportunities there are beautiful things new things we do not always see them when were depressed we have enough knowledge to hang in there as they are there and we will experience them again.  Patient understanding but still recognizing hard to reach people when they are in this deep dark place not understanding as their life seems like it is going well.  Talked about some of the things that does delight including Shirlean Schlein, Publishing copy and what he shared about life, patient appreciating travel patient going to a concert last week to see the [facility] boys abandon who grew up near her so brought back a lot of memories and she had a great emotional and fun experience with music and visual bringing up things for her.  At about what it was like growing up [place] and some of the advantages she had talked about it being a melting pot this being interesting as well grow up near [place]  community.  Therapist assesses positive to highlight some of the appreciation's we have with her memories as well as life experiences Suicidal/Homicidal: No  Plan: Return again in 4 weeks.2.Continue with plan-Review with patient any insights from journaling introduce patient CBT through thoughts and feelings block.3.  His thoughts and feelings in session to help with coping  Diagnosis: Bipolar 2, major depressive disorder recurrent moderate, adjustment insomnia  Collaboration of Care: Other none needed  Patient/Guardian was advised Release of Information must be obtained prior to any record release in order to collaborate their care with an outside provider. Patient/Guardian was advised if they have not already done so to contact the registration department to sign all necessary forms in order for [place] to release information regarding their care.   Consent: Patient/Guardian gives verbal consent for treatment and assignment of benefits for services provided during this visit. Patient/Guardian expressed understanding and agreed to proceed.   Coolidge Breeze, [place] 12/09/2022

## 2022-12-13 DIAGNOSIS — S86001D Unspecified injury of right Achilles tendon, subsequent encounter: Secondary | ICD-10-CM | POA: Diagnosis not present

## 2022-12-14 DIAGNOSIS — M1711 Unilateral primary osteoarthritis, right knee: Secondary | ICD-10-CM | POA: Diagnosis not present

## 2022-12-21 ENCOUNTER — Other Ambulatory Visit (HOSPITAL_COMMUNITY): Payer: Self-pay

## 2022-12-21 ENCOUNTER — Encounter (HOSPITAL_BASED_OUTPATIENT_CLINIC_OR_DEPARTMENT_OTHER): Payer: Self-pay | Admitting: Pulmonary Disease

## 2022-12-21 ENCOUNTER — Ambulatory Visit (INDEPENDENT_AMBULATORY_CARE_PROVIDER_SITE_OTHER): Payer: HMO | Admitting: Pulmonary Disease

## 2022-12-21 VITALS — BP 132/72 | HR 77 | Temp 99.3°F | Ht 66.0 in | Wt 265.0 lb

## 2022-12-21 DIAGNOSIS — G4733 Obstructive sleep apnea (adult) (pediatric): Secondary | ICD-10-CM

## 2022-12-21 DIAGNOSIS — J4530 Mild persistent asthma, uncomplicated: Secondary | ICD-10-CM

## 2022-12-21 DIAGNOSIS — M1711 Unilateral primary osteoarthritis, right knee: Secondary | ICD-10-CM | POA: Diagnosis not present

## 2022-12-21 MED ORDER — ALBUTEROL SULFATE HFA 108 (90 BASE) MCG/ACT IN AERS
2.0000 | INHALATION_SPRAY | Freq: Four times a day (QID) | RESPIRATORY_TRACT | 2 refills | Status: AC | PRN
  Filled 2023-05-16: qty 6.7, 25d supply, fill #0

## 2022-12-21 MED ORDER — FLUTICASONE-SALMETEROL 115-21 MCG/ACT IN AERO: 2.0000 | INHALATION_SPRAY | Freq: Two times a day (BID) | RESPIRATORY_TRACT | 5 refills | Status: DC

## 2022-12-21 NOTE — Patient Instructions (Signed)
Mild persistent asthma - uncontrolled cough --START Advair 115-21 mcg TWO puff in the morning and evening  --CONTINUE Albuterol AS NEEDED for shortness of breath or wheezing  OSA on oral compliance device Low-normal oxygen --Recommend overnight oxygen testing. Decline for now

## 2022-12-21 NOTE — Progress Notes (Signed)
Subjective:   PATIENT ID: Mary Mcfarland GENDER: female DOB: [birth year], MRN: [id number]   HPI  Chief Complaint  Patient presents with   Follow-up    Follow up.     Reason for Visit: Follow-up  Ms. Mary Mcfarland is a 67 year old female former smoker with childhood asthma, OSA, atrial flutter/fibrillation, sinus pauses s/p PM 2021, HLD and GERD who presents for follow-up for follow-up for chronic cough, asthma.  Synopsis: Referred by Dr. Tenny Craw at [facility] reviewed. Note 07/27/21 reports persistent dyspnea. Prescribed albuterol She reports longstanding shortness of breath that has persisted even after her PM.  Five years ago she reports normal activity and being able to walk upstairs. Very rarely used her albuterol inhaler. Is not on any maintenance inhalers. Denies coughing and wheezing. But since her PM her activity is more limited. Worsened around animal dander, pollen. She is currently not active due to knee pain.   10/14/21 Since our last visit she has persistent shortness of breath with exertion. Seems to have persistent after her atrial fibrillation started. Denies cough and wheezing. Rarely uses albuterol.   12/21/22 Since our last visit she has continued to have on and off cough with productive sputum and wheezing. Had URI in Feb/March that may have last 8 weeks. Uses albuterol during her sick periods. Currently has cough that started a few days ago. Felt well between March and July but did have some allergies. Reports Breo did not help due to the powdery taste. Denies shortness of breath. She has sleep apnea and has oral compliance device.      No data to display         Social History: Former smoker. Smoked 1 ppd. Started at 18 and stopped at 40 years. Quit in 25 years ago  Past Medical History:  Diagnosis Date   Anticoagulant long-term use    eliquis--- managed by cardiology   Asthma, mild persistent    pulmonologist--- dr Irma Newness;   (04-08-2022  pt stated last rescue inhaler use 2 months ago)   Bipolar 2 disorder (HCC)    Cardiac pacemaker in situ 02/06/2020   for tachy-brady syndrome and symptomatic bradycardia;   [facility]  (ep cardiologist--- dr Johney Frame)   Cystocele with rectocele    Difficult intubation 03/2016   04-08-2022  pt stated was scheduled for achilles tendon repair @ [facility] prior to when was done @ [facility] on 10-03-207,  when she woke up was told by nurse the surgery was note done due to Narrow airway and did not have what was needed so surgery was rescheduled (no documentation of any type in epic about this incident) per anesthesia record from 03-15-2016 pt had no issues and pt stated no issues   Diverticulosis of colon    DOE (dyspnea on exertion)    pt stated due to aftrial fib, sob,  is able to do household chores but with long distance walking and stairs get sob   Edema of both lower extremities    GERD (gastroesophageal reflux disease)    History of adenomatous polyp of colon    History of diverticulitis of colon    Hyperlipidemia    Hypertension    Longstanding persistent atrial fibrillation [facility]) 2017   cardiologist-- dr c. Hughie Closs;  for dx 2017 the reacurrance 01/ 2021;  s/p DCCV 07-24-2019;  admitted for tikosyn loading 03/ 2021;  sss s/p dual PPM 02-06-2020   MDD (major depressive disorder)  OA (osteoarthritis)    OSA (obstructive sleep apnea)    sleep study in epic 04-06-2021 OSA and nocturnal hypoxemia, per pt was unable to tolerate bipap recommended but does have appointment to get dental appliance   Pelvic prolapse    PONV (postoperative nausea and vomiting)    Pre-diabetes    Sciatica of right side    SSS (sick sinus syndrome) (HCC)    Thyroid nodule    followed by pcp;  last ultrasound in epic 12-09-2011 pt stated never had bx done   Vitamin D deficiency    Wears glasses      Family History  Problem Relation Age of Onset   Heart disease Mother    CVA Mother    Hypertension  Mother    Cancer Mother    Stroke Mother    Depression Mother    Bipolar disorder Mother    Obesity Mother    Heart disease Father    Hypertension Father    Heart attack Father    Diabetes Father    Liver disease Father    Alcoholism Father    Drug abuse Father    Obesity Father      Social History   Occupational History   Occupation: retired  Tobacco Use   Smoking status: Former    Packs/day: 1.00    Years: 22.00    Additional pack years: 0.00    Total pack years: 22.00    Types: Cigarettes    Quit date: 02/13/1996    Years since quitting: 26.8   Smokeless tobacco: Never  Vaping Use   Vaping Use: Never used  Substance and Sexual Activity   Alcohol use: Not Currently    Comment: occassionally   Drug use: Never   Sexual activity: Not Currently    Allergies  Allergen Reactions   Niacin Hives     Outpatient Medications Prior to Visit  Medication Sig Dispense Refill   diltiazem (DILT-XR) 120 MG 24 hr capsule Take 1 capsule (120 mg total) by mouth daily. Please schedule appointment for refills. 90 capsule 1   dofetilide (TIKOSYN) 250 MCG capsule Take 1 capsule (250 mcg total) by mouth 2 (two) times daily. 180 capsule 3   ELIQUIS 5 MG TABS tablet TAKE ONE TABLET BY MOUTH TWICE DAILY 180 tablet 1   furosemide (LASIX) 40 MG tablet TAKE ONE TABLET BY MOUTH TWICE DAILY take afternoon dose AS NEEDED 180 tablet 0   lisinopril (ZESTRIL) 10 MG tablet Take 10 mg by mouth daily.     valACYclovir (VALTREX) 1000 MG tablet Take 1 tablet (1,000 mg total) by mouth 3 (three) times daily. (Patient taking differently: Take 1,000 mg by mouth 3 (three) times daily as needed (shingles).) 21 tablet 0   Wheat Dextrin (BENEFIBER) POWD Take 1 Dose by mouth daily. 1 dose = 1 tablespoon     zolpidem (AMBIEN) 10 MG tablet Take 10 mg by mouth at bedtime.     albuterol (PROVENTIL HFA;VENTOLIN HFA) 108 (90 Base) MCG/ACT inhaler Inhale 2 puffs into the lungs every 6 (six) hours as needed for wheezing  or shortness of breath.     acetaminophen (TYLENOL) 500 MG tablet take 2 tablets po every 6 hours for 5 days then prn-post operative pain (Patient taking differently: as needed. take 2 tablets po every 6 hours for 5 days then prn-post operative pain) 30 tablet 0   atorvastatin (LIPITOR) 40 MG tablet TAKE ONE TABLET BY MOUTH ONCE DAILY 90 tablet 2   buPROPion (  WELLBUTRIN SR) 150 MG 12 hr tablet Take 2 tablets (300 mg total) by mouth daily. 180 tablet 0   Cholecalciferol (VITAMIN D3) 50 MCG (2000 UT) capsule Take 2,000 Units by mouth daily.     lamoTRIgine (LAMICTAL) 150 MG tablet Take 1 tablet (150 mg total) by mouth daily. 90 tablet 0   metoprolol tartrate (LOPRESSOR) 100 MG tablet Take 1 tablet (100 mg total) by mouth once for 1 dose. 2 hours prior to procedure time 1 tablet 0   Naphazoline-Pheniramine (OPCON-A) 0.027-0.315 % SOLN Place 1-2 drops into both eyes as needed (for allergies).     omeprazole (PRILOSEC) 20 MG capsule Take 20 mg by mouth daily before breakfast.     No facility-administered medications prior to visit.    Review of Systems  Constitutional:  Negative for chills, diaphoresis, fever, malaise/fatigue and weight loss.  HENT:  Negative for congestion.   Respiratory:  Positive for cough, sputum production and wheezing. Negative for hemoptysis and shortness of breath.   Cardiovascular:  Negative for chest pain, palpitations and leg swelling.     Objective:   Vitals:   12/21/22 0921  BP: 132/72  Pulse: 77  Temp: 99.3 F (37.4 C)  TempSrc: Oral  SpO2: 93%  Weight: 265 lb (120.2 kg)  Height: 5\' 6"  (1.676 m)   SpO2: 93 % O2 Device: None (Room air)  Physical Exam: General: Well-appearing, no acute distress HENT: [place], AT Eyes: EOMI, no scleral icterus Respiratory: Clear to auscultation bilaterally.  No crackles, wheezing or rales Cardiovascular: RRR, -M/R/G, no JVD Extremities:-Edema,-tenderness Neuro: AAO x4, CNII-XII grossly intact Psych: Normal mood, normal  affect  Data Reviewed:  Imaging: CT Cardiac 07/01/19 - Visualized lung parenchyma is normal with no interstitial changes, ground glass or nodules/masses  CXR 08/23/21 - No acute issues  PFT: 10/13/21 FVC 2.51 (75%) FEV1 1.96 (76%) Ratio 77 DLCO 130% Interpretation: No obstructive defect on spirometry. Partial bronchodilator response however not significant. This does not preclude benefit of bronchodilator response. Elevated DLCO may also suggest. Clinically correlate.  Labs: CBC    Component Value Date/Time   WBC 10.9 (H) 07/20/2022 0949   RBC 4.46 07/20/2022 0949   HGB 12.1 07/20/2022 0949   HGB 13.7 04/19/2021 1052   HGB 14.0 02/15/2007 1501   HCT 39.3 07/20/2022 0949   HCT 41.0 04/19/2021 1052   HCT 40.0 02/15/2007 1501   PLT 302 07/20/2022 0949   PLT 352 04/19/2021 1052   MCV 88.1 07/20/2022 0949   MCV 85 04/19/2021 1052   MCV 87.2 02/15/2007 1501   MCH 27.1 07/20/2022 0949   MCHC 30.8 07/20/2022 0949   RDW 14.6 07/20/2022 0949   RDW 12.3 04/19/2021 1052   RDW 12.9 02/15/2007 1501   LYMPHSABS 2.6 01/21/2020 1648   LYMPHSABS 2.8 02/15/2007 1501   MONOABS 1.3 (H) 03/26/2014 2005   MONOABS 0.9 02/15/2007 1501   EOSABS 0.3 01/21/2020 1648   BASOSABS 0.1 01/21/2020 1648   BASOSABS 0.0 02/15/2007 1501   Absolute eos 01/21/20 - 300     Assessment & Plan:   Discussion: 67 year old female former smoker with childhood asthma, OSA, atrial flutter/fibrillation, sinus pauses s/p PM 2021, HLD and GERD who presents for follow-up. Continues to have productive cough. Discussed clinical course and management of asthma including bronchodilator regimen, preventive care and action plan for exacerbation.  Mild persistent asthma - uncontrolled cough --START Advair 115-21 mcg TWO puff in the morning and evening  --CONTINUE Albuterol AS NEEDED for shortness of breath  or wheezing  OSA on oral compliance device Low-normal oxygen --Recommend overnight oxygen testing. Decline for  now  Health Maintenance Immunization History  Administered Date(s) Administered   Influenza-Unspecified 01/14/2020   PFIZER(Purple Top)SARS-COV-2 Vaccination 10/05/2019, 10/28/2019   Td 06/14/2003   Tdap 12/06/2011   Zoster, Live 02/15/2018, 07/22/2020   CT Lung Screen - not qualified. >15 years since patient smoked  No orders of the defined types were placed in this encounter.  Meds ordered this encounter  Medications   fluticasone-salmeterol (ADVAIR HFA) 115-21 MCG/ACT inhaler    Sig: Inhale 2 puffs into the lungs 2 (two) times daily.    Dispense:  1 each    Refill:  5   albuterol (VENTOLIN HFA) 108 (90 Base) MCG/ACT inhaler    Sig: Inhale 2 puffs into the lungs every 6 (six) hours as needed for wheezing or shortness of breath.    Dispense:  8 g    Refill:  2    Return in about 3 months (around 03/23/2023).   I have spent a total time of 33-minutes on the day of the appointment including chart review, data review, collecting history, coordinating care and discussing medical diagnosis and plan with the patient/family. Past medical history, allergies, medications were reviewed. Pertinent imaging, labs and tests included in this note have been reviewed and interpreted independently by me.  Mohammed Mcandrew Mechele Collin, MD [facility] 12/21/2022 9:48 AM  Office Number [phone]

## 2022-12-28 DIAGNOSIS — R197 Diarrhea, unspecified: Secondary | ICD-10-CM | POA: Diagnosis not present

## 2022-12-30 ENCOUNTER — Telehealth: Payer: HMO | Admitting: Nurse Practitioner

## 2022-12-30 DIAGNOSIS — H1032 Unspecified acute conjunctivitis, left eye: Secondary | ICD-10-CM | POA: Diagnosis not present

## 2022-12-30 MED ORDER — OFLOXACIN 0.3 % OP SOLN
1.0000 [drp] | Freq: Four times a day (QID) | OPHTHALMIC | 0 refills | Status: AC
Start: 2022-12-30 — End: 2023-01-04

## 2022-12-30 NOTE — Progress Notes (Signed)
E-Visit for Mary Mcfarland   We are sorry that you are not feeling well.  Here is how we plan to help!  Based on what you have shared with me it looks like you have conjunctivitis.  Conjunctivitis is a common inflammatory or infectious condition of the eye that is often referred to as "pink eye".  In most cases it is contagious (viral or bacterial). However, not all conjunctivitis requires antibiotics (ex. Allergic).  We have made appropriate suggestions for you based upon your presentation.  There also may be the start of a stye in or under your eyelid. In addition to the antibiotic drops, try placing a warm washcloth on your eye for added relief   I have prescribed Oflaxacin 1-2 drops 4 times a day times 5 days   Pink eye can be highly contagious.  It is typically spread through direct contact with secretions, or contaminated objects or surfaces that one may have touched.  Strict handwashing is suggested with soap and water is urged.  If not available, use alcohol based had sanitizer.  Avoid unnecessary touching of the eye.  If you wear contact lenses, you will need to refrain from wearing them until you see no white discharge from the eye for at least 24 hours after being on medication.  You should see symptom improvement in 1-2 days after starting the medication regimen.  Call us if symptoms are not improved in 1-2 days.  Home Care: Wash your hands often! Do not wear your contacts until you complete your treatment plan. Avoid sharing towels, bed linen, personal items with a person who has pink eye. See attention for anyone in your home with similar symptoms.  Get Help Right Away If: Your symptoms do not improve. You develop blurred or loss of vision. Your symptoms worsen (increased discharge, pain or redness)   Thank you for choosing an e-visit.  Your e-visit answers were reviewed by a board certified advanced clinical practitioner to complete your personal care plan. Depending upon the  condition, your plan could have included both over the counter or prescription medications.  Please review your pharmacy choice. Make sure the pharmacy is open so you can pick up prescription now. If there is a problem, you may contact your provider through [employer] and have the prescription routed to another pharmacy.  Your safety is important to [place]. If you have drug allergies check your prescription carefully.   For the next 24 hours you can use MyChart to ask questions about today's visit, request a non-urgent call back, or ask for a work or school excuse. You will get an email in the next two days asking about your experience. I hope that your e-visit has been valuable and will speed your recovery.  Meds ordered this encounter  Medications   ofloxacin (OCUFLOX) 0.3 % ophthalmic solution    Sig: Place 1 drop into the left eye 4 (four) times daily for 5 days.    Dispense:  5 mL    Refill:  0    I spent approximately 5 minutes reviewing the patient's history, current symptoms and coordinating their care today.

## 2023-01-02 DIAGNOSIS — S86001D Unspecified injury of right Achilles tendon, subsequent encounter: Secondary | ICD-10-CM | POA: Diagnosis not present

## 2023-01-06 ENCOUNTER — Telehealth (HOSPITAL_COMMUNITY): Payer: HMO | Admitting: Psychiatry

## 2023-01-09 ENCOUNTER — Ambulatory Visit (HOSPITAL_COMMUNITY): Payer: HMO | Admitting: Licensed Clinical Social Worker

## 2023-01-09 DIAGNOSIS — M1712 Unilateral primary osteoarthritis, left knee: Secondary | ICD-10-CM | POA: Diagnosis not present

## 2023-01-11 ENCOUNTER — Telehealth: Payer: Self-pay | Admitting: Cardiology

## 2023-01-11 NOTE — Telephone Encounter (Signed)
Spoke with the patient who states that her heart rate is still in the 120s. She states that she is feeling fine. She did have some dizziness earlier today. Her watch is showing that she is in AFIB. She is back taking her medications as prescribed.  She is wondering if her pacemaker can be checked. I will send a note to the device team.  Will also check to see if we can get her into the AFIB clinic.

## 2023-01-11 NOTE — Telephone Encounter (Signed)
Patient is not home right now, will not be home until tomorrow, patient will send transmission tomorrow.

## 2023-01-11 NOTE — Telephone Encounter (Signed)
  Patient c/o Palpitations:  STAT if patient reporting lightheadedness, shortness of breath, or chest pain  How long have you had palpitations/irregular HR/ Afib? Are you having the symptoms now? Yes   Are you currently experiencing lightheadedness, SOB or CP? No   Do you have a history of afib (atrial fibrillation) or irregular heart rhythm? Yes   Have you checked your BP or HR? (document readings if available): HR 123  Are you experiencing any other symptoms? None   The patient mentioned that her Apple Watch notified her of AFib last night. This morning, she checked again and found that she is still in AFib with a heart rate of 123. She is asymptomatic and denies experiencing lightheadedness, shortness of breath, or chest pain

## 2023-01-11 NOTE — Telephone Encounter (Signed)
States it was 8pm last night when first started. This morning still saying afib. Today HR is at 123 No symptoms Sunday night she forgot to take her Tikosyn & Eliquis. On Monday she took both her morning and evening pills at the same time (not intentionally). Aware I am forwarding to Dr. Lovena Neighbours nurse to discuss with him. Aware someone will call back w/ advisement once MD reviews/advises. She is agreeable to plan.

## 2023-01-12 NOTE — Telephone Encounter (Signed)
Patient is scheduled to see AFIB clinic on 8/6.  Per Dr. Lalla Brothers she can double her diltiazem for now and monitor her blood pressure. She will not take an extra tablet if systolic is less than 110. Spoke with the patient and she agrees with the plan.

## 2023-01-12 NOTE — Telephone Encounter (Signed)
Remote transmission received.  Pt in AF ongoing since 01/10/2023.

## 2023-01-16 ENCOUNTER — Telehealth (HOSPITAL_COMMUNITY): Payer: Self-pay | Admitting: Psychiatry

## 2023-01-16 DIAGNOSIS — M1712 Unilateral primary osteoarthritis, left knee: Secondary | ICD-10-CM | POA: Diagnosis not present

## 2023-01-16 MED ORDER — LAMOTRIGINE 25 MG PO TABS: 25.0000 mg | ORAL_TABLET | Freq: Every day | ORAL | 0 refills | Status: DC

## 2023-01-16 NOTE — Telephone Encounter (Signed)
Pt calling. She went off her medications About 6wks ago.  She would like to start back on meds and would like some direction on what she should do. I recommended for her to make an appt, however patient requested to leave a message for Provider (or on call provider).   Please advise.   Phone- [phone]

## 2023-01-17 ENCOUNTER — Encounter (HOSPITAL_COMMUNITY): Payer: Self-pay | Admitting: Physician Assistant

## 2023-01-17 ENCOUNTER — Encounter (HOSPITAL_BASED_OUTPATIENT_CLINIC_OR_DEPARTMENT_OTHER): Payer: Self-pay

## 2023-01-17 ENCOUNTER — Other Ambulatory Visit (HOSPITAL_COMMUNITY): Payer: Self-pay | Admitting: Psychiatry

## 2023-01-17 ENCOUNTER — Ambulatory Visit (HOSPITAL_COMMUNITY)
Admission: RE | Admit: 2023-01-17 | Discharge: 2023-01-17 | Disposition: A | Discharge status: Home / Self Care | Payer: HMO | Source: Ambulatory Visit | Attending: Physician Assistant | Admitting: Physician Assistant

## 2023-01-17 ENCOUNTER — Telehealth (HOSPITAL_COMMUNITY): Payer: Self-pay | Admitting: Psychiatry

## 2023-01-17 VITALS — BP 124/84 | HR 123 | Ht 66.0 in | Wt 264.2 lb

## 2023-01-17 DIAGNOSIS — I4819 Other persistent atrial fibrillation: Secondary | ICD-10-CM | POA: Diagnosis not present

## 2023-01-17 DIAGNOSIS — G4733 Obstructive sleep apnea (adult) (pediatric): Secondary | ICD-10-CM | POA: Insufficient documentation

## 2023-01-17 DIAGNOSIS — E669 Obesity, unspecified: Secondary | ICD-10-CM | POA: Insufficient documentation

## 2023-01-17 DIAGNOSIS — I484 Atypical atrial flutter: Secondary | ICD-10-CM | POA: Insufficient documentation

## 2023-01-17 DIAGNOSIS — I4510 Unspecified right bundle-branch block: Secondary | ICD-10-CM | POA: Diagnosis not present

## 2023-01-17 DIAGNOSIS — I480 Paroxysmal atrial fibrillation: Secondary | ICD-10-CM

## 2023-01-17 DIAGNOSIS — Z6841 Body Mass Index (BMI) 40.0 and over, adult: Secondary | ICD-10-CM | POA: Insufficient documentation

## 2023-01-17 DIAGNOSIS — D6869 Other thrombophilia: Secondary | ICD-10-CM | POA: Insufficient documentation

## 2023-01-17 DIAGNOSIS — R001 Bradycardia, unspecified: Secondary | ICD-10-CM | POA: Diagnosis not present

## 2023-01-17 DIAGNOSIS — I10 Essential (primary) hypertension: Secondary | ICD-10-CM | POA: Insufficient documentation

## 2023-01-17 LAB — MAGNESIUM: Magnesium: 2.1 mg/dL (ref 1.7–2.4)

## 2023-01-17 LAB — CBC
HCT: 44.1 % (ref 36.0–46.0)
Hemoglobin: 13.7 g/dL (ref 12.0–15.0)
MCH: 26 pg (ref 26.0–34.0)
MCHC: 31.1 g/dL (ref 30.0–36.0)
MCV: 83.8 fL (ref 80.0–100.0)
Platelets: 334 10*3/uL (ref 150–400)
RBC: 5.26 MIL/uL — ABNORMAL HIGH (ref 3.87–5.11)
RDW: 14.6 % (ref 11.5–15.5)
WBC: 11.2 10*3/uL — ABNORMAL HIGH (ref 4.0–10.5)
nRBC: 0 % (ref 0.0–0.2)

## 2023-01-17 LAB — BASIC METABOLIC PANEL
Anion gap: 12 (ref 5–15)
BUN: 16 mg/dL (ref 8–23)
CO2: 23 mmol/L (ref 22–32)
Calcium: 9.1 mg/dL (ref 8.9–10.3)
Chloride: 102 mmol/L (ref 98–111)
Creatinine, Ser: 0.81 mg/dL (ref 0.44–1.00)
GFR, Estimated: >60 mL/min (ref >60)
Glucose, Bld: 120 mg/dL — ABNORMAL HIGH (ref 70–99)
Potassium: 3.6 mmol/L (ref 3.5–5.1)
Sodium: 137 mmol/L (ref 135–145)

## 2023-01-17 NOTE — Addendum Note (Signed)
Encounter addended by: Danice Goltz, PA on: 01/17/2023 9:41 AM  Actions taken: Clinical Note Signed

## 2023-01-17 NOTE — Patient Instructions (Addendum)
Increase cardizem to twice a day until day of cardioversion then reduce back to once a day   Cardioversion scheduled for: Wednesday, August 14th   - Arrive at the [facility] and go to admitting at [facility] not eat or drink anything after midnight the night prior to your procedure.   - Take all your morning medication (except diabetic medications) with a sip of water prior to arrival.  - You will not be able to drive home after your procedure.    - Do NOT miss any doses of your blood thinner - if you should miss a dose please notify our office immediately.   - If you feel as if you go back into normal rhythm prior to scheduled cardioversion, please notify our office immediately.   If your procedure is canceled in the cardioversion suite you will be charged a cancellation fee.

## 2023-01-17 NOTE — Progress Notes (Addendum)
Primary Care Physician: Daisy Floro, MD Primary Cardiologist: Dr Cristal Deer  Primary Electrophysiologist: Dr Lalla Brothers Referring Physician: Dr Chyrel Masson is a 67 y.o. female with a history of HTN, bipolar disorder, symptomatic bradycardia s/p PPM, and persistent atrial fibrillation who presents for follow up in the [facility]. She was first diagnosed with atrial fibrillation in 2017 and then recurrence found in January of this year at PCP visit. She went in for evaluation of headaches, dizziness.  She was placed on BB and Eliquis.  She underwent attempted cardioversion in February which failed to restore SR. Patient is on Eliquis for a CHADS2VASC score of 3. She was evaluated by Dr Johney Frame on 08/09/19 who recommended dofetilide. Patient is s/p dofetilide loading 3/16-3/19/21. She did not require DCCV. Patient was seen at her PCP office on 11/21/19 and found to be bradycardic HR 44 with PACs. She was also noted to have a 3 second pause on rhythm strip. Her BB was stopped. Patient reports that her SOB has improved off of metoprolol. Patient presented for DCCV on 12/06/19 and was found to be in SR, procedure was cancelled.   On follow up today, patient sent a manual transmission to the device clinic for rapid heart rates. The transmission showed that she was in afib starting 01/10/23. She did miss her evening medications on 7/28. There were no other specific triggers that she could identify.   Today, she denies symptoms of chest pain, orthopnea, PND, presyncope, syncope, bleeding, or neurologic sequela. The patient is tolerating medications without difficulties and is otherwise without complaint today.    Atrial Fibrillation Risk Factors:  she does have symptoms or diagnosis of sleep apnea.  she does not have a history of rheumatic fever. she does not have a history of alcohol use.   Atrial Fibrillation Management history:  Previous  antiarrhythmic drugs: dofetilide Previous cardioversions: 07/24/19 Previous ablations: none Anticoagulation history: Eliquis   Past Medical History:  Diagnosis Date   Anticoagulant long-term use    eliquis--- managed by cardiology   Asthma, mild persistent    pulmonologist--- dr Irma Newness;  (04-08-2022  pt stated last rescue inhaler use 2 months ago)   Bipolar 2 disorder (HCC)    Cardiac pacemaker in situ 02/06/2020   for tachy-brady syndrome and symptomatic bradycardia;   [employer]  (ep cardiologist--- dr Johney Frame)   Cystocele with rectocele    Difficult intubation 03/2016   04-08-2022  pt stated was scheduled for achilles tendon repair @ [facility] prior to when was done @ [facility] on 10-03-207,  when she woke up was told by nurse the surgery was note done due to Narrow airway and did not have what was needed so surgery was rescheduled (no documentation of any type in epic about this incident) per anesthesia record from 03-15-2016 pt had no issues and pt stated no issues   Diverticulosis of colon    DOE (dyspnea on exertion)    pt stated due to aftrial fib, sob,  is able to do household chores but with long distance walking and stairs get sob   Edema of both lower extremities    GERD (gastroesophageal reflux disease)    History of adenomatous polyp of colon    History of diverticulitis of colon    Hyperlipidemia    Hypertension    Longstanding persistent atrial fibrillation [facility]) 2017   cardiologist-- dr c. Hughie Closs;  for dx 2017 the reacurrance 01/ 2021;  s/p  DCCV 07-24-2019;  admitted for tikosyn loading 03/ 2021;  sss s/p dual PPM 02-06-2020   MDD (major depressive disorder)    OA (osteoarthritis)    OSA (obstructive sleep apnea)    sleep study in epic 04-06-2021 OSA and nocturnal hypoxemia, per pt was unable to tolerate bipap recommended but does have appointment to get dental appliance   Pelvic prolapse    PONV (postoperative nausea and vomiting)    Pre-diabetes    Sciatica of  right side    SSS (sick sinus syndrome) (HCC)    Thyroid nodule    followed by pcp;  last ultrasound in epic 12-09-2011 pt stated never had bx done   Vitamin D deficiency    Wears glasses     ROS- All systems are reviewed and negative except as per the HPI above.  Physical Exam: Vitals:   01/17/23 0835  BP: 124/84  Pulse: (!) 123  Weight: 119.8 kg  Height: 5\' 6"  (1.676 m)    GEN: Well nourished, well developed in no acute distress NECK: No JVD; No carotid bruits CARDIAC: Irregularly irregular rate and rhythm, no murmurs, rubs, gallops RESPIRATORY:  Clear to auscultation without rales, wheezing or rhonchi  ABDOMEN: Soft, non-tender, non-distended EXTREMITIES:  No edema; No deformity    Wt Readings from Last 3 Encounters:  01/17/23 119.8 kg  12/21/22 120.2 kg  10/05/22 128.4 kg    EKG today demonstrates  Afib with RVR, RBBB Vent. rate 123 BPM PR interval * ms QRS duration 132 ms QT/QTcB 366/523 ms  Echo 07/01/19 demonstrated  1. Left ventricular ejection fraction, by visual estimation, is 60 to  65%. The left ventricle has normal function. There is mildly increased  left ventricular hypertrophy.   2. Left ventricular diastolic parameters are indeterminate.   3. The left ventricle has no regional wall motion abnormalities.   4. Global right ventricle has normal systolic function.The right  ventricular size is normal. No increase in right ventricular wall  thickness.   5. Left atrial size was moderately dilated.   6. Right atrial size was normal.   7. The mitral valve is normal in structure. Trivial mitral valve  regurgitation. No evidence of mitral stenosis.   8. The tricuspid valve is normal in structure.   9. The tricuspid valve is normal in structure. Tricuspid valve  regurgitation is not demonstrated.  10. The aortic valve is tricuspid. Aortic valve regurgitation is not  visualized. Mild to moderate aortic valve sclerosis/calcification without  any evidence  of aortic stenosis.  11. Calcified non coronary cusp.  12. The pulmonic valve was normal in structure. Pulmonic valve  regurgitation is not visualized.  13. The inferior vena cava is normal in size with greater than 50%  respiratory variability, suggesting right atrial pressure of 3 mmHg.   Epic records are reviewed at length today  CHA2DS2-VASc Score = 3  The patient's score is based upon: CHF History: 0 HTN History: 1 Diabetes History: 0 Stroke History: 0 Vascular Disease History: 0 Age Score: 1 Gender Score: 1       ASSESSMENT AND PLAN: Persistent Atrial Fibrillation/atypical atrial flutter The patient's CHA2DS2-VASc score is 3, indicating a 3.2% annual risk of stroke.   Patient in persistent afib since 01/10/23. We discussed rhythm control options, will arrange for DCCV. She has not missed any doses of anticoagulation since her afib began.  Check bmet/cbc/mag today Continue diltiazem 120 mg daily, increase to BID prior to DCCV then back down to once daily post  DCCV.  Continue dofetilide 250 mcg BID Continue Eliquis 5 mg BID  Secondary Hypercoagulable State (ICD10:  D68.69) The patient is at significant risk for stroke/thromboembolism based upon her CHA2DS2-VASc Score of 3.  Continue Apixaban (Eliquis).   Obesity Body mass index is 42.64 kg/m.  Encouraged lifestyle modification  OSA  Patient uses an oral appliance She did not tolerate BiPap  HTN Stable on current regimen  Symptomatic bradycardia S/p PPM, followed by Dr Lalla Brothers and the device clinic   Follow up with Dr Cristal Deer and Francis Dowse as scheduled.    Informed Consent   Shared Decision Making/Informed Consent The risks (stroke, cardiac arrhythmias rarely resulting in the need for a temporary or permanent pacemaker, skin irritation or burns and complications associated with conscious sedation including aspiration, arrhythmia, respiratory failure and death), benefits (restoration of normal sinus  rhythm) and alternatives of a direct current cardioversion were explained in detail to [name] and she agrees to proceed.        Jorja Loa PA-C Afib Clinic [facility] [address] [place], [place] [address] [phone] 01/17/2023 9:17 AM

## 2023-01-17 NOTE — Telephone Encounter (Signed)
Patient called in regarding her lamictal 25mg . She said that the pharmacy has not received anything.Her preferred pharmacy is  [facility] - [place], [place] - [address] Phone: [phone]  Fax: [phone]     I'm sorry if I sent this to the wrong people. Please either let me know or forward it to the right people.

## 2023-01-17 NOTE — Telephone Encounter (Signed)
LVM PER PROVIDER:: If not taken lamictal for a while. It has to be started at 25mg  as directed to to avoid risk of side effect and rash. Call back for concerns and make next appointment when available.

## 2023-01-18 ENCOUNTER — Other Ambulatory Visit (HOSPITAL_COMMUNITY): Payer: Self-pay

## 2023-01-18 ENCOUNTER — Other Ambulatory Visit (HOSPITAL_COMMUNITY): Payer: Self-pay | Admitting: *Deleted

## 2023-01-18 MED ORDER — LAMOTRIGINE 25 MG PO TABS: 25.0000 mg | ORAL_TABLET | Freq: Every day | ORAL | 0 refills | Status: DC

## 2023-01-18 MED ORDER — POTASSIUM CHLORIDE ER 10 MEQ PO TBCR
10.0000 meq | EXTENDED_RELEASE_TABLET | Freq: Every day | ORAL | 1 refills | Status: DC
  Filled 2023-03-26: qty 90, 90d supply, fill #0

## 2023-01-23 ENCOUNTER — Telehealth: Payer: Self-pay

## 2023-01-23 DIAGNOSIS — M1712 Unilateral primary osteoarthritis, left knee: Secondary | ICD-10-CM | POA: Diagnosis not present

## 2023-01-23 NOTE — Telephone Encounter (Signed)
Remote transmission received 01/23/23. planned for DCCV on 08/14. The patient is in a sinus rhythm today. Routing to staff to notify.

## 2023-01-24 ENCOUNTER — Other Ambulatory Visit (HOSPITAL_COMMUNITY): Payer: Self-pay | Admitting: *Deleted

## 2023-01-24 MED ORDER — LAMOTRIGINE 25 MG PO TABS: 25.0000 mg | ORAL_TABLET | Freq: Every day | ORAL | 0 refills | Status: DC

## 2023-01-25 ENCOUNTER — Ambulatory Visit (HOSPITAL_COMMUNITY): Admission: RE | Admit: 2023-01-25 | Payer: HMO | Source: Home / Self Care | Admitting: Cardiology

## 2023-01-25 ENCOUNTER — Encounter (HOSPITAL_COMMUNITY): Admission: RE | Payer: Self-pay | Source: Home / Self Care

## 2023-01-25 SURGERY — CARDIOVERSION
Anesthesia: General

## 2023-01-26 ENCOUNTER — Ambulatory Visit (HOSPITAL_COMMUNITY): Payer: HMO | Admitting: Licensed Clinical Social Worker

## 2023-01-31 ENCOUNTER — Ambulatory Visit (HOSPITAL_BASED_OUTPATIENT_CLINIC_OR_DEPARTMENT_OTHER): Payer: HMO | Admitting: Cardiology

## 2023-01-31 ENCOUNTER — Encounter (HOSPITAL_BASED_OUTPATIENT_CLINIC_OR_DEPARTMENT_OTHER): Payer: Self-pay | Admitting: Cardiology

## 2023-01-31 VITALS — BP 118/64 | HR 95 | Ht 66.0 in | Wt 266.1 lb

## 2023-01-31 DIAGNOSIS — I700 Atherosclerosis of aorta: Secondary | ICD-10-CM

## 2023-01-31 DIAGNOSIS — R0609 Other forms of dyspnea: Secondary | ICD-10-CM | POA: Diagnosis not present

## 2023-01-31 DIAGNOSIS — I4819 Other persistent atrial fibrillation: Secondary | ICD-10-CM | POA: Diagnosis not present

## 2023-01-31 DIAGNOSIS — R600 Localized edema: Secondary | ICD-10-CM | POA: Diagnosis not present

## 2023-01-31 DIAGNOSIS — D6869 Other thrombophilia: Secondary | ICD-10-CM

## 2023-01-31 DIAGNOSIS — Z950 Presence of cardiac pacemaker: Secondary | ICD-10-CM | POA: Diagnosis not present

## 2023-01-31 DIAGNOSIS — I480 Paroxysmal atrial fibrillation: Secondary | ICD-10-CM | POA: Diagnosis not present

## 2023-01-31 DIAGNOSIS — Z7901 Long term (current) use of anticoagulants: Secondary | ICD-10-CM

## 2023-01-31 MED ORDER — ATORVASTATIN CALCIUM 40 MG PO TABS
40.0000 mg | ORAL_TABLET | Freq: Every day | ORAL | 3 refills | Status: DC
Start: 2023-01-31 — End: 2024-02-19
  Filled 2023-02-21: qty 90, 90d supply, fill #0
  Filled 2023-05-20: qty 90, 90d supply, fill #1

## 2023-01-31 MED ORDER — DILTIAZEM HCL ER 120 MG PO CP24
120.0000 mg | ORAL_CAPSULE | Freq: Every day | ORAL | 3 refills | Status: AC
Start: 2023-01-31 — End: ?
  Filled 2023-02-08 (×2): qty 90, 90d supply, fill #0

## 2023-01-31 MED ORDER — FUROSEMIDE 40 MG PO TABS
40.0000 mg | ORAL_TABLET | Freq: Every day | ORAL | 3 refills | Status: DC
Start: 2023-01-31 — End: 2023-07-24
  Filled 2023-02-21 (×2): qty 90, 90d supply, fill #0
  Filled 2023-05-20: qty 90, 90d supply, fill #1
  Filled 2023-07-17: qty 90, 90d supply, fill #2

## 2023-01-31 NOTE — Patient Instructions (Addendum)
Medication Instructions:  Your physician recommends that you continue on your current medications as directed. Please refer to the Current Medication list given to you today.  *If you need a refill on your cardiac medications before your next appointment, please call your pharmacy*  Lab Work: NONE  Testing/Procedures: NONE  Follow-Up: At [facility], you and your health needs are our priority.  As part of our continuing mission to provide you with exceptional heart care, we have created designated Provider Care Teams.  These Care Teams include your primary Cardiologist (physician) and Advanced Practice Providers (APPs -  Physician Assistants and Nurse Practitioners) who all work together to provide you with the care you need, when you need it.  We recommend signing up for the patient portal called "MyChart".  Sign up information is provided on this After Visit Summary.  MyChart is used to connect with patients for Virtual Visits (Telemedicine).  Patients are able to view lab/test results, encounter notes, upcoming appointments, etc.  Non-urgent messages can be sent to your provider as well.   To learn more about what you can do with MyChart, go to [email or web address].    Your next appointment:   12 month(s)  The format for your next appointment:   In Person  Provider:   Jodelle Red, MD or Ronn Melena NP    If you want to switch, we are the [facility]

## 2023-02-02 ENCOUNTER — Other Ambulatory Visit (HOSPITAL_BASED_OUTPATIENT_CLINIC_OR_DEPARTMENT_OTHER): Payer: Self-pay

## 2023-02-02 ENCOUNTER — Encounter (HOSPITAL_COMMUNITY): Payer: Self-pay | Admitting: Licensed Clinical Social Worker

## 2023-02-02 MED ORDER — ACETAMINOPHEN 500 MG PO TABS
1000.0000 mg | ORAL_TABLET | Freq: Two times a day (BID) | ORAL | 0 refills | Status: DC
Start: 2022-04-14 — End: 2023-02-06

## 2023-02-02 MED ORDER — DOXYCYCLINE HYCLATE 100 MG PO CAPS
100.0000 mg | ORAL_CAPSULE | Freq: Two times a day (BID) | ORAL | 1 refills | Status: DC
Start: 2022-10-24 — End: 2023-02-17

## 2023-02-02 MED ORDER — COLESTIPOL HCL 1 G PO TABS
2.0000 g | ORAL_TABLET | Freq: Every morning | ORAL | 3 refills | Status: DC
Start: 2023-01-05 — End: 2023-02-06

## 2023-02-02 MED ORDER — LISINOPRIL 10 MG PO TABS
10.0000 mg | ORAL_TABLET | Freq: Every day | ORAL | 4 refills | Status: AC
Start: 2022-08-26 — End: ?
  Filled 2023-02-08 – 2023-03-15 (×6): qty 90, 90d supply, fill #0
  Filled 2023-06-08: qty 90, 90d supply, fill #1

## 2023-02-03 ENCOUNTER — Telehealth (HOSPITAL_COMMUNITY): Payer: HMO | Admitting: Psychiatry

## 2023-02-03 ENCOUNTER — Encounter (HOSPITAL_COMMUNITY): Payer: Self-pay | Admitting: Psychiatry

## 2023-02-03 ENCOUNTER — Other Ambulatory Visit (HOSPITAL_BASED_OUTPATIENT_CLINIC_OR_DEPARTMENT_OTHER): Payer: Self-pay

## 2023-02-03 DIAGNOSIS — F3181 Bipolar II disorder: Secondary | ICD-10-CM

## 2023-02-03 DIAGNOSIS — F5102 Adjustment insomnia: Secondary | ICD-10-CM

## 2023-02-03 DIAGNOSIS — F331 Major depressive disorder, recurrent, moderate: Secondary | ICD-10-CM

## 2023-02-03 MED ORDER — LAMOTRIGINE 100 MG PO TABS
100.0000 mg | ORAL_TABLET | Freq: Every day | ORAL | 0 refills | Status: DC
  Filled 2023-02-03: qty 30, 30d supply, fill #0

## 2023-02-03 NOTE — Progress Notes (Signed)
[facility] Follow up visit   Patient Identification: Mary Mcfarland MRN:  [id number] Date of Evaluation:  02/03/2023 Referral Source: primary care Chief Complaint:  depression follow up , moodiness Visit Diagnosis:    ICD-10-CM   1. MDD (major depressive disorder), recurrent episode, moderate (HCC)  F33.1     2. Bipolar 2 disorder (HCC)  F31.81     3. Adjustment insomnia  F51.02     Virtual Visit via Video Note  I connected with Mary Mcfarland on 02/03/23 at 10:30 AM EDT by a video enabled telemedicine application and verified that I am speaking with the correct person using two identifiers.  Location: Patient: home Provider: home office   I discussed the limitations of evaluation and management by telemedicine and the availability of in person appointments. The patient expressed understanding and agreed to proceed.      I discussed the assessment and treatment plan with the patient. The patient was provided an opportunity to ask questions and all were answered. The patient agreed with the plan and demonstrated an understanding of the instructions.   The patient was advised to call back or seek an in-person evaluation if the symptoms worsen or if the condition fails to improve as anticipated.  I provided 20 minutes of non-face-to-face time during this encounter.           History of Present Illness: Patient is a 67 years old currently single Caucasian female initially referred by primary care physician and cardiology for management and establishment of depression She has been seeing a psychiatrist for many years he closed the practice and she started seeing a psychiatrist at [facility] this year till they closed.    Last visit doing better but later got frustrated over her multiple meds and co morbid medical conditions and decided to take off from wellbutrin and lamictal, family noticed she is getting more edgy, moody and should get back on her meds  Also suffered  from Afib, has pacemaker and recently had foot injury  She has called office and now back on lamictal 50mg , no rash and doing some better, her prior dose was 150mg   We have kept her off from wellbutrin for now      Aggravating factors; health issues Modifying factors; grand kids Severity; getting better , non compliant recently    Patient denies drug use or alcohol use     Past Psychiatric History: Depression, possible bipolar  Previous Psychotropic Medications: Yes     Past Medical History:  Past Medical History:  Diagnosis Date   Anticoagulant long-term use    eliquis--- managed by cardiology   Asthma, mild persistent    pulmonologist--- dr Irma Newness;  (04-08-2022  pt stated last rescue inhaler use 2 months ago)   Bipolar 2 disorder (HCC)    Cardiac pacemaker in situ 02/06/2020   for tachy-brady syndrome and symptomatic bradycardia;   [employer]  (ep cardiologist--- dr Johney Frame)   Cystocele with rectocele    Difficult intubation 03/2016   04-08-2022  pt stated was scheduled for achilles tendon repair @ [facility] prior to when was done @ [facility] on 10-03-207,  when she woke up was told by nurse the surgery was note done due to Narrow airway and did not have what was needed so surgery was rescheduled (no documentation of any type in epic about this incident) per anesthesia record from 03-15-2016 pt had no issues and pt stated no issues   Diverticulosis of colon    DOE (  dyspnea on exertion)    pt stated due to aftrial fib, sob,  is able to do household chores but with long distance walking and stairs get sob   Edema of both lower extremities    GERD (gastroesophageal reflux disease)    History of adenomatous polyp of colon    History of diverticulitis of colon    Hyperlipidemia    Hypertension    Longstanding persistent atrial fibrillation [facility]) 2017   cardiologist-- dr c. Hughie Closs;  for dx 2017 the reacurrance 01/ 2021;  s/p DCCV 07-24-2019;  admitted for tikosyn loading  03/ 2021;  sss s/p dual PPM 02-06-2020   MDD (major depressive disorder)    OA (osteoarthritis)    OSA (obstructive sleep apnea)    sleep study in epic 04-06-2021 OSA and nocturnal hypoxemia, per pt was unable to tolerate bipap recommended but does have appointment to get dental appliance   Pelvic prolapse    PONV (postoperative nausea and vomiting)    Pre-diabetes    Sciatica of right side    SSS (sick sinus syndrome) (HCC)    Thyroid nodule    followed by pcp;  last ultrasound in epic 12-09-2011 pt stated never had bx done   Vitamin D deficiency    Wears glasses     Past Surgical History:  Procedure Laterality Date   ACHILLES TENDON SURGERY Right 08/02/2022   Procedure: RIGHT HEEL PUMP BUMP EXCISION AND ACHILLES TENDON REPAIR;  Surgeon: Marcene Corning, MD;  Location: [facility];  Service: Orthopedics;  Laterality: Right;   ANTERIOR AND POSTERIOR REPAIR N/A 04/14/2022   Procedure: POSTERIOR REPAIR (RECTOCELE);  Surgeon: Silverio Lay, MD;  Location: [facility];  Service: Gynecology;  Laterality: N/A;   CARDIOVERSION N/A 07/24/2019   Procedure: CARDIOVERSION;  Surgeon: Jodelle Red, MD;  Location: [facility] ENDOSCOPY;  Service: Cardiovascular;  Laterality: N/A;   COLONOSCOPY WITH PROPOFOL  11/2020   dr Marca Ancona   HEEL SPUR RESECTION Left 03/15/2016   Procedure: ACHILLIES TENDON REPAIR AND PUMP BUMP EXCISION;  Surgeon: Marcene Corning, MD;  Location: [facility];  Service: Orthopedics;  Laterality: Left;   PACEMAKER IMPLANT N/A 02/06/2020   Procedure: PACEMAKER IMPLANT;  Surgeon: Hillis Range, MD;  Location: [facility] INVASIVE CV LAB;  Service: Cardiovascular;  Laterality: N/A;   PERINEOPLASTY N/A 04/14/2022   Procedure: PERINEOPLASTY;  Surgeon: Silverio Lay, MD;  Location: [facility];  Service: Gynecology;  Laterality: N/A;   SHOULDER ARTHROSCOPY Right 11/15/2005   @[facility]  by dr Jerl Santos   TOE SURGERY Right 2011   bone spur   TONSILLECTOMY  1979    Family  Psychiatric History: Mom diagnosed with possible bipolar  Family History:  Family History  Problem Relation Age of Onset   Heart disease Mother    CVA Mother    Hypertension Mother    Cancer Mother    Stroke Mother    Depression Mother    Bipolar disorder Mother    Obesity Mother    Heart disease Father    Hypertension Father    Heart attack Father    Diabetes Father    Liver disease Father    Alcoholism Father    Drug abuse Father    Obesity Father     Social History:   Social History   Socioeconomic History   Marital status: Divorced    Spouse name: Not on file   Number of children: Not on file   Years of education: Not on file   Highest education level:  Not on file  Occupational History   Occupation: retired  Tobacco Use   Smoking status: Former    Current packs/day: 0.00    Average packs/day: 1 pack/day for 22.0 years (22.0 ttl pk-yrs)    Types: Cigarettes    Start date: 02/12/1974    Quit date: 02/13/1996    Years since quitting: 26.9   Smokeless tobacco: Never  Vaping Use   Vaping status: Never Used  Substance and Sexual Activity   Alcohol use: Not Currently    Comment: occassionally   Drug use: Never   Sexual activity: Not Currently  Other Topics Concern   Not on file  Social History Narrative   Lives in [place] alone   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, clinical)   Social Determinants of Health   Financial Resource Strain: Not on file  Food Insecurity: Not on file  Transportation Needs: Not on file  Physical Activity: Not on file  Stress: Not on file  Social Connections: Not on file     Allergies:   Allergies  Allergen Reactions   Niacin Hives    Metabolic Disorder Labs: Lab Results  Component Value Date   HGBA1C 6.0 (H) 07/20/2022   MPG 125.5 07/20/2022   No results found for: "PROLACTIN" Lab Results  Component Value Date   CHOL 168 01/14/2020   TRIG 78 01/14/2020   HDL 61 01/14/2020   CHOLHDL 3.1 11/13/2019   LDLCALC 92 01/14/2020    LDLCALC 101 (H) 11/13/2019   Lab Results  Component Value Date   TSH 3.610 01/14/2020    Therapeutic Level Labs: No results found for: "LITHIUM" No results found for: "CBMZ" No results found for: "VALPROATE"  Current Medications: See chart    Psychiatric Specialty Exam: Review of Systems  Cardiovascular:  Negative for chest pain.  Skin:  Negative for rash.  Psychiatric/Behavioral:  Negative for hallucinations.     Last menstrual period 09/24/2013.There is no height or weight on file to calculate BMI.  General Appearance: Casual  Eye Contact:  Fair  Speech:  Normal Rate  Volume:  Normal  Mood: somewhat stressed  Affect:  Congruent  Thought Process:  Goal Directed  Orientation:  Full (Time, Place, and Person)  Thought Content:  Rumination  Suicidal Thoughts:  No  Homicidal Thoughts:  No  Memory:  Immediate;   Fair Recent;   Fair  Judgement:  Fair  Insight:  Shallow  Psychomotor Activity:  decreased  Concentration:  Concentration: Fair and Attention Span: Fair  Recall:  [employer] of Knowledge:Fair  Language: Good  Akathisia:  No  Handed:   AIMS (if indicated):  not done  Assets:  Desire for Improvement Housing Social Support  ADL's:  Intact  Cognition: WNL  Sleep:   variable   Screenings: Insurance account manager from 04/05/2022 in [facility] at [facility] Office Visit from 01/14/2020 in [facility] at [facility] Total Score 2 6  PHQ-9 Total Score 8 14      Flowsheet Row ED from 08/06/2022 in [facility] Emergency Department at [facility] Admission (Discharged) from 08/02/2022 in [place] [facility] Pre-Admission Testing 60 from [facility]  C-SSRS RISK CATEGORY No Risk No Risk No Risk       Assessment and Plan: as follows Prior documentation reviewed   Bipolar 2 ; depressed ; got unstable  without meds, now back on lamictal increase to  75mg  in one week and then 100mg  in 2 weeks. No rash  Depression; some better, cut has health issues, continue lamictal as above, hold off from wellbutrin for now  Insomnia:;fluctuates, continue sleep hygiene, Fu 62m.  earlier if needed   Thresa Ross, MD 8/23/202410:40 AM

## 2023-02-05 NOTE — Progress Notes (Unsigned)
Cardiology Office Note Date:  02/06/2023  Patient ID:  Mary Mcfarland, Mary Mcfarland Mary Mcfarland, MRN [id number] PCP:  Daisy Floro, MD  Cardiologist:  Dr. Cristal Deer Electrophysiologist: Dr. Lalla Brothers     Chief Complaint:   4 mo  History of Present Illness: Mary Mcfarland is a 67 y.o. female with history of HTN, HLD, obesity, AFib, SSSx w/PPM  She aw Dr. Lalla Brothers 10/01/21, low AFib burden, stable QTc, device functioning appropriately.  Saw T. Asa Lente, PA-C 07/22/22, for gen cards pre-op pending achilles surgery, felt an acceptable cardiac candidate. Device and anticoagulation have all been addressed   Now pending D&C, pharmacy has addressed OAC cleared for 2 day hold RCRI score is zero, 0.4%  I saw her in April 2024 She has canceled her D&C for now, wants to better/back on her feet completely from her achilles surgery before she does that. No CP She has DOE No rest SOB, but gets winded easily with exertion, this pre-dates her PPM, but getting it didn't help. Mentions that at some juncture she was recommended to get coronary CT to look into her SOB more, but cancelled because she was in Afib and never rescheduled, wonders if we can revisit that She has put on weight as well, probably contributes some, but despite eating well, can not get it off asks about weight loss aids She thinks she had Afib a few days ago, felt palpitations, watch told her she was. No near syncope or syncope No bleeding or signs of bleeding QTc table <1% burden Referred to [facility] weight loss options SOB suspect multifactorial, planned for coronary CTa previously planned for her  Ca++ zero, no CAD, (pulm findings as below and addressed by her PMD)  Saw the Afib clinic 01/17/23 with more AFib (alerted by her watch, not particularly symptomatic), planned for DCCV (though reverted back to SR without it)  Saw Dr. Cristal Deer 01/31/23 (incomplete note) looks like no changes were made, she was in SR this  visit  TODAY She says that this was the 1st time she has had more then just some brief palpitations, short episodes of Afib since on Tikosyn She did miss PM dose of her meds a day or two before it started, also was staying with an older relative helping her through a recovery period, so not in her usual environment. Typically very good with her medication complaince  Otherwise she has been great No CP, SOB, DOE No near syncope or syncope No bleeding or signs of bleeding    Device information Abbott dual chamber PPM implanted 02/06/20  AFib/AAD hx Diagnosed 2021 Tikosyn 2021   Past Medical History:  Diagnosis Date   Anticoagulant long-term use    eliquis--- managed by cardiology   Asthma, mild persistent    pulmonologist--- dr Irma Newness;  (04-08-2022  pt stated last rescue inhaler use 2 months ago)   Bipolar 2 disorder (HCC)    Cardiac pacemaker in situ 02/06/2020   for tachy-brady syndrome and symptomatic bradycardia;   [employer]  (ep cardiologist--- dr Johney Frame)   Cystocele with rectocele    Difficult intubation 03/2016   04-08-2022  pt stated was scheduled for achilles tendon repair @ [facility] prior to when was done @ [facility] on 10-03-207,  when she woke up was told by nurse the surgery was note done due to Narrow airway and did not have what was needed so surgery was rescheduled (no documentation of any type in epic about this incident) per anesthesia record  from 03-15-2016 pt had no issues and pt stated no issues   Diverticulosis of colon    DOE (dyspnea on exertion)    pt stated due to aftrial fib, sob,  is able to do household chores but with long distance walking and stairs get sob   Edema of both lower extremities    GERD (gastroesophageal reflux disease)    History of adenomatous polyp of colon    History of diverticulitis of colon    Hyperlipidemia    Hypertension    Longstanding persistent atrial fibrillation [facility]) 2017   cardiologist-- dr c. Hughie Closs;  for dx  2017 the reacurrance 01/ 2021;  s/p DCCV 07-24-2019;  admitted for tikosyn loading 03/ 2021;  sss s/p dual PPM 02-06-2020   MDD (major depressive disorder)    OA (osteoarthritis)    OSA (obstructive sleep apnea)    sleep study in epic 04-06-2021 OSA and nocturnal hypoxemia, per pt was unable to tolerate bipap recommended but does have appointment to get dental appliance   Pelvic prolapse    PONV (postoperative nausea and vomiting)    Pre-diabetes    Sciatica of right side    SSS (sick sinus syndrome) (HCC)    Thyroid nodule    followed by pcp;  last ultrasound in epic 12-09-2011 pt stated never had bx done   Vitamin D deficiency    Wears glasses     Past Surgical History:  Procedure Laterality Date   ACHILLES TENDON SURGERY Right 08/02/2022   Procedure: RIGHT HEEL PUMP BUMP EXCISION AND ACHILLES TENDON REPAIR;  Surgeon: Marcene Corning, MD;  Location: [facility];  Service: Orthopedics;  Laterality: Right;   ANTERIOR AND POSTERIOR REPAIR N/A 04/14/2022   Procedure: POSTERIOR REPAIR (RECTOCELE);  Surgeon: Silverio Lay, MD;  Location: [facility];  Service: Gynecology;  Laterality: N/A;   CARDIOVERSION N/A 07/24/2019   Procedure: CARDIOVERSION;  Surgeon: Jodelle Red, MD;  Location: [facility] ENDOSCOPY;  Service: Cardiovascular;  Laterality: N/A;   COLONOSCOPY WITH PROPOFOL  11/2020   dr Marca Ancona   HEEL SPUR RESECTION Left 03/15/2016   Procedure: ACHILLIES TENDON REPAIR AND PUMP BUMP EXCISION;  Surgeon: Marcene Corning, MD;  Location: [facility];  Service: Orthopedics;  Laterality: Left;   PACEMAKER IMPLANT N/A 02/06/2020   Procedure: PACEMAKER IMPLANT;  Surgeon: Hillis Range, MD;  [facility] INVASIVE [facility] LAB;  Service: Cardiovascular;  Laterality: N/A;   PERINEOPLASTY N/A 04/14/2022   Procedure: PERINEOPLASTY;  Surgeon: Silverio Lay, MD;  Location: [facility];  Service: Gynecology;  Laterality: N/A;   SHOULDER ARTHROSCOPY Right 11/15/2005   @[facility]  by dr Jerl Santos    TOE SURGERY Right 2011   bone spur   TONSILLECTOMY  1979    Current Outpatient Medications  Medication Sig Dispense Refill   albuterol (VENTOLIN HFA) 108 (90 Base) MCG/ACT inhaler Inhale 2 puffs into the lungs every 6 (six) hours as needed for wheezing or shortness of breath. 8 g 2   apixaban (ELIQUIS) 5 MG TABS tablet Take 1 tablet (5 mg total) by mouth 2 (two) times daily. 180 tablet 1   atorvastatin (LIPITOR) 40 MG tablet Take 1 tablet (40 mg total) by mouth daily. 90 tablet 3   diltiazem (DILT-XR) 120 MG 24 hr capsule Take 1 capsule (120 mg total) by mouth daily. 90 capsule 3   dofetilide (TIKOSYN) 250 MCG capsule Take 1 capsule (250 mcg total) by mouth 2 (two) times daily. 180 capsule 3   fluticasone-salmeterol (ADVAIR HFA) 115-21 MCG/ACT inhaler Inhale  2 puffs into the lungs 2 (two) times daily. 12 g 5   furosemide (LASIX) 40 MG tablet Take 1 tablet (40 mg total) by mouth daily. 90 tablet 3   lamoTRIgine (LAMICTAL) 100 MG tablet Take 1 tablet (100 mg total) by mouth daily. Hold for 10 days till ready for increased dose 30 tablet 0   lisinopril (ZESTRIL) 10 MG tablet Take 10 mg by mouth daily.     lisinopril (ZESTRIL) 10 MG tablet Take 1 tablet (10 mg total) by mouth daily. 90 tablet 4   potassium chloride (KLOR-CON) 10 MEQ tablet Take 1 tablet (10 mEq total) by mouth daily. 90 tablet 1   valACYclovir (VALTREX) 1000 MG tablet Take 1 tablet (1,000 mg total) by mouth 3 (three) times daily. (Patient taking differently: Take 1,000 mg by mouth 3 (three) times daily as needed (shingles).) 21 tablet 0   Wheat Dextrin (BENEFIBER) POWD Take 1 Dose by mouth daily.     zolpidem (AMBIEN) 10 MG tablet Take 10 mg by mouth at bedtime.     doxycycline (VIBRAMYCIN) 100 MG capsule Take 1 capsule (100 mg total) by mouth 2 (two) times daily. 20 capsule 1   No current facility-administered medications for this visit.    Allergies:   Niacin   Social History:  The patient  reports that she quit smoking  about 27 years ago. Her smoking use included cigarettes. She started smoking about 49 years ago. She has a 22 pack-year smoking history. She has never used smokeless tobacco. She reports that she does not currently use alcohol. She reports that she does not use drugs.   Family History:  The patient's family history includes Alcoholism in her father; Bipolar disorder in her mother; CVA in her mother; Cancer in her mother; Depression in her mother; Diabetes in her father; Drug abuse in her father; Heart attack in her father; Heart disease in her father and mother; Hypertension in her father and mother; Liver disease in her father; Obesity in her father and mother; Stroke in her mother.  ROS:  Please see the history of present illness.    All other systems are reviewed and otherwise negative.   PHYSICAL EXAM:  VS:  BP 114/62 (BP Location: Left Arm, Patient Position: Sitting, Cuff Size: Normal)   Pulse 76   Ht 5\' 6"  (1.676 m)   Wt 264 lb 3.2 oz (119.8 kg)   LMP 09/24/2013   SpO2 94%   BMI 42.64 kg/m  BMI: Body mass index is 42.64 kg/m. Well nourished, well developed, in no acute distress HEENT: normocephalic, atraumatic Neck: no JVD, carotid bruits or masses Cardiac:  RRR; no significant murmurs, no rubs, or gallops Lungs: CTA b/l, no wheezing, rhonchi or rales Abd: soft, nontender MS: no deformity or atrophy Ext: no edema Skin: warm and dry, no rash Neuro:  No gross deficits appreciated Psych: euthymic mood, full affect  PPM site is stable, no tethering or discomfort   EKG:  Done today and reviewed by myself shows  SR 86bpm, 1st degree AVblock , QTc  Device interrogation done today and reviewed by myself:  Battery and lead measurements are stable No further AFib No HVR episodes   10/17/22: Coronary CT IMPRESSION: 1.  Calcium score 0 2.  Normal ascending thoracic aorta 3.1 cm 3.  Normal right dominant coronary arteries 4.  Pacing wires in RA/RV 5.  Large Chicken  Wing appendage with no thrombus IMPRESSION: Mucoid impaction, peribronchovascular nodularity and subpleural consolidation in the right  lower lobe, most indicative of an infectious process. Consider follow-up CT chest without contrast in 3-4 weeks to ensure resolution, as clinically indicated, as malignancy cannot be definitively excluded.   07/01/2019: TTE 1. Left ventricular ejection fraction, by visual estimation, is 60 to  65%. The left ventricle has normal function. There is mildly increased  left ventricular hypertrophy.   2. Left ventricular diastolic parameters are indeterminate.   3. The left ventricle has no regional wall motion abnormalities.   4. Global right ventricle has normal systolic function.The right  ventricular size is normal. No increase in right ventricular wall  thickness.   5. Left atrial size was moderately dilated.   6. Right atrial size was normal.   7. The mitral valve is normal in structure. Trivial mitral valve  regurgitation. No evidence of mitral stenosis.   8. The tricuspid valve is normal in structure.   9. The tricuspid valve is normal in structure. Tricuspid valve  regurgitation is not demonstrated.  10. The aortic valve is tricuspid. Aortic valve regurgitation is not  visualized. Mild to moderate aortic valve sclerosis/calcification without  any evidence of aortic stenosis.  11. Calcified non coronary cusp.  12. The pulmonic valve was normal in structure. Pulmonic valve  regurgitation is not visualized.  13. The inferior vena cava is normal in size with greater than 50%  respiratory variability, suggesting right atrial pressure of 3 mmHg.    Recent Labs: 01/17/2023: BUN 16; Creatinine, Ser 0.81; Hemoglobin 13.7; Magnesium 2.1; Platelets 334; Potassium 3.6; Sodium 137  No results found for requested labs within last 365 days.   Estimated Creatinine Clearance: 91.3 mL/min (by C-G formula based on SCr of 0.81 mg/dL).   Wt Readings from Last 3  Encounters:  02/06/23 264 lb 3.2 oz (119.8 kg)  01/31/23 266 lb 1.6 oz (120.7 kg)  01/17/23 264 lb 3.2 oz (119.8 kg)     Other studies reviewed: Additional studies/records reviewed today include: summarized above  ASSESSMENT AND PLAN:  PPM Intact function Programmed as above  Persistent Afib CHA2DS2Vasc is 2, on Eliquis, appropriately dosed Tikosyn, stable QTc 11% % burden >> None since her spontaneous conversion Repeat K+ level today  HTN Looks good  4. Secondary hypercoagulable state  Disposition: 4 mo, sooner if needed  Current medicines are reviewed at length with the patient today.  The patient did not have any concerns regarding medicines.  Norma Fredrickson, PA-C 02/06/2023 10:39 AM     [facility] [address] [place] [address] [phone] (office)  [phone] (fax)

## 2023-02-06 ENCOUNTER — Encounter: Payer: Self-pay | Admitting: Physician Assistant

## 2023-02-06 ENCOUNTER — Ambulatory Visit: Payer: HMO | Attending: Physician Assistant | Admitting: Physician Assistant

## 2023-02-06 VITALS — BP 114/62 | HR 76 | Ht 66.0 in | Wt 264.2 lb

## 2023-02-06 DIAGNOSIS — I4819 Other persistent atrial fibrillation: Secondary | ICD-10-CM | POA: Diagnosis not present

## 2023-02-06 DIAGNOSIS — Z79899 Other long term (current) drug therapy: Secondary | ICD-10-CM | POA: Diagnosis not present

## 2023-02-06 DIAGNOSIS — Z5181 Encounter for therapeutic drug level monitoring: Secondary | ICD-10-CM | POA: Diagnosis not present

## 2023-02-06 DIAGNOSIS — I10 Essential (primary) hypertension: Secondary | ICD-10-CM

## 2023-02-06 DIAGNOSIS — Z950 Presence of cardiac pacemaker: Secondary | ICD-10-CM

## 2023-02-06 DIAGNOSIS — D6869 Other thrombophilia: Secondary | ICD-10-CM

## 2023-02-06 LAB — CUP PACEART INCLINIC DEVICE CHECK
Battery Remaining Longevity: 66 mo
Battery Voltage: 3.01 V
Brady Statistic RA Percent Paced: 37 %
Brady Statistic RV Percent Paced: 46 %
Date Time Interrogation Session: 20240826124255
Implantable Lead Connection Status: 753985
Implantable Lead Connection Status: 753985
Implantable Lead Implant Date: 20210826
Implantable Lead Implant Date: 20210826
Implantable Lead Location: 753859
Implantable Lead Location: 753860
Implantable Pulse Generator Implant Date: 20210826
Lead Channel Impedance Value: 437.5 Ohm
Lead Channel Impedance Value: 487.5 Ohm
Lead Channel Pacing Threshold Amplitude: 1.25 V
Lead Channel Pacing Threshold Amplitude: 1.25 V
Lead Channel Pacing Threshold Amplitude: 1.25 V
Lead Channel Pacing Threshold Amplitude: 1.25 V
Lead Channel Pacing Threshold Pulse Width: 0.4 ms
Lead Channel Pacing Threshold Pulse Width: 0.4 ms
Lead Channel Pacing Threshold Pulse Width: 1 ms
Lead Channel Pacing Threshold Pulse Width: 1 ms
Lead Channel Sensing Intrinsic Amplitude: 3 mV
Lead Channel Sensing Intrinsic Amplitude: 4.6 mV
Lead Channel Setting Pacing Amplitude: 2.5 V
Lead Channel Setting Pacing Amplitude: 2.5 V
Lead Channel Setting Pacing Pulse Width: 0.4 ms
Lead Channel Setting Sensing Sensitivity: 2 mV
Pulse Gen Model: 2272
Pulse Gen Serial Number: 3859348

## 2023-02-06 LAB — POTASSIUM: Potassium: 4.2 mmol/L (ref 3.5–5.2)

## 2023-02-06 NOTE — Patient Instructions (Addendum)
Medication Instructions:   Your physician recommends that you continue on your current medications as directed. Please refer to the Current Medication list given to you today.   *If you need a refill on your cardiac medications before your next appointment, please call your pharmacy*   Lab Work:  POTASSIUM TODAY    If you have labs (blood work) drawn today and your tests are completely normal, you will receive your results only by: MyChart Message (if you have MyChart) OR A paper copy in the mail If you have any lab test that is abnormal or we need to change your treatment, we will call you to review the results.   Testing/Procedures:  NONE ORDERED  TODAY      Follow-Up: At [facility], you and your health needs are our priority.  As part of our continuing mission to provide you with exceptional heart care, we have created designated Provider Care Teams.  These Care Teams include your primary Cardiologist (physician) and Advanced Practice Providers (APPs -  Physician Assistants and Nurse Practitioners) who all work together to provide you with the care you need, when you need it.  We recommend signing up for the patient portal called "MyChart".  Sign up information is provided on this After Visit Summary.  MyChart is used to connect with patients for Virtual Visits (Telemedicine).  Patients are able to view lab/test results, encounter notes, upcoming appointments, etc.  Non-urgent messages can be sent to your provider as well.   To learn more about what you can do with MyChart, go to [email or web address].    Your next appointment:    4 month(s) ( CONTACT [facility] FOR CONFLICTS )  Provider:   Steffanie Dunn, MD or Francis Dowse, PA-C    Other Instructions

## 2023-02-07 ENCOUNTER — Other Ambulatory Visit (HOSPITAL_BASED_OUTPATIENT_CLINIC_OR_DEPARTMENT_OTHER): Payer: Self-pay

## 2023-02-07 DIAGNOSIS — E782 Mixed hyperlipidemia: Secondary | ICD-10-CM | POA: Diagnosis not present

## 2023-02-07 DIAGNOSIS — I4891 Unspecified atrial fibrillation: Secondary | ICD-10-CM | POA: Diagnosis not present

## 2023-02-07 DIAGNOSIS — R7303 Prediabetes: Secondary | ICD-10-CM | POA: Diagnosis not present

## 2023-02-07 DIAGNOSIS — D6869 Other thrombophilia: Secondary | ICD-10-CM | POA: Diagnosis not present

## 2023-02-07 DIAGNOSIS — Z79899 Other long term (current) drug therapy: Secondary | ICD-10-CM | POA: Diagnosis not present

## 2023-02-07 DIAGNOSIS — G4700 Insomnia, unspecified: Secondary | ICD-10-CM | POA: Diagnosis not present

## 2023-02-07 MED ORDER — ZOLPIDEM TARTRATE 10 MG PO TABS
5.0000 mg | ORAL_TABLET | Freq: Every evening | ORAL | 2 refills | Status: DC | PRN
  Filled 2023-02-27: qty 30, 30d supply, fill #0
  Filled 2023-03-26: qty 30, 30d supply, fill #1
  Filled 2023-04-27: qty 30, 30d supply, fill #2

## 2023-02-08 ENCOUNTER — Other Ambulatory Visit (HOSPITAL_BASED_OUTPATIENT_CLINIC_OR_DEPARTMENT_OTHER): Payer: Self-pay

## 2023-02-08 ENCOUNTER — Ambulatory Visit (INDEPENDENT_AMBULATORY_CARE_PROVIDER_SITE_OTHER): Payer: HMO

## 2023-02-08 ENCOUNTER — Telehealth (HOSPITAL_COMMUNITY): Payer: Self-pay | Admitting: *Deleted

## 2023-02-08 DIAGNOSIS — I480 Paroxysmal atrial fibrillation: Secondary | ICD-10-CM

## 2023-02-08 LAB — CUP PACEART REMOTE DEVICE CHECK
Battery Remaining Longevity: 62 mo
Battery Remaining Percentage: 71 %
Battery Voltage: 3.01 V
Brady Statistic AP VP Percent: 31 %
Brady Statistic AP VS Percent: 13 %
Brady Statistic AS VP Percent: 18 %
Brady Statistic AS VS Percent: 29 %
Brady Statistic RA Percent Paced: 37 %
Brady Statistic RV Percent Paced: 46 %
Date Time Interrogation Session: 20240828034004
Implantable Lead Connection Status: 753985
Implantable Lead Connection Status: 753985
Implantable Lead Implant Date: 20210826
Implantable Lead Implant Date: 20210826
Implantable Lead Location: 753859
Implantable Lead Location: 753860
Implantable Pulse Generator Implant Date: 20210826
Lead Channel Impedance Value: 440 Ohm
Lead Channel Impedance Value: 490 Ohm
Lead Channel Pacing Threshold Amplitude: 1.25 V
Lead Channel Pacing Threshold Amplitude: 1.25 V
Lead Channel Pacing Threshold Pulse Width: 0.4 ms
Lead Channel Pacing Threshold Pulse Width: 1 ms
Lead Channel Sensing Intrinsic Amplitude: 2.7 mV
Lead Channel Sensing Intrinsic Amplitude: 3.6 mV
Lead Channel Setting Pacing Amplitude: 2.5 V
Lead Channel Setting Pacing Amplitude: 2.5 V
Lead Channel Setting Pacing Pulse Width: 0.4 ms
Lead Channel Setting Sensing Sensitivity: 2 mV
Pulse Gen Model: 2272
Pulse Gen Serial Number: 3859348

## 2023-02-08 MED ORDER — LAMOTRIGINE 100 MG PO TABS
100.0000 mg | ORAL_TABLET | Freq: Every day | ORAL | 0 refills | Status: DC
Start: 2023-02-08 — End: 2023-04-05
  Filled 2023-02-08 – 2023-02-12 (×2): qty 90, 90d supply, fill #0

## 2023-02-08 MED ORDER — LAMOTRIGINE 100 MG PO TABS
100.0000 mg | ORAL_TABLET | Freq: Every day | ORAL | 0 refills | Status: DC
  Filled 2023-02-08: qty 30, 30d supply, fill #0

## 2023-02-08 NOTE — Telephone Encounter (Signed)
Patient called a Little upset/aggravated. Stated that she had Appt on last Friday 8/23 to discus concerns about her med's.  She stated that she just got off the phone with her Rx & that while her medication was sent BUT none had any refills. Asked if her medication could be resent with refills?

## 2023-02-08 NOTE — Addendum Note (Signed)
Addended by: Thresa Ross on: 02/08/2023 12:56 PM   Modules accepted: Orders

## 2023-02-08 NOTE — Telephone Encounter (Signed)
Patient notified &informed-- Per Provider::Sent 90 days . Usually I wait till sure of maintenance dose before we do 90 days  Patient was pleased to hear & said thanks

## 2023-02-08 NOTE — Addendum Note (Signed)
Addended by: Thresa Ross on: 02/08/2023 02:06 PM   Modules accepted: Orders

## 2023-02-09 ENCOUNTER — Ambulatory Visit (INDEPENDENT_AMBULATORY_CARE_PROVIDER_SITE_OTHER): Payer: Self-pay | Admitting: Licensed Clinical Social Worker

## 2023-02-09 ENCOUNTER — Encounter (HOSPITAL_COMMUNITY): Payer: Self-pay

## 2023-02-09 ENCOUNTER — Other Ambulatory Visit (HOSPITAL_BASED_OUTPATIENT_CLINIC_OR_DEPARTMENT_OTHER): Payer: Self-pay

## 2023-02-09 DIAGNOSIS — F331 Major depressive disorder, recurrent, moderate: Secondary | ICD-10-CM

## 2023-02-09 DIAGNOSIS — F3181 Bipolar II disorder: Secondary | ICD-10-CM

## 2023-02-09 DIAGNOSIS — F5102 Adjustment insomnia: Secondary | ICD-10-CM

## 2023-02-09 NOTE — Progress Notes (Signed)
Patient did not show for appointment.   

## 2023-02-13 ENCOUNTER — Other Ambulatory Visit (HOSPITAL_BASED_OUTPATIENT_CLINIC_OR_DEPARTMENT_OTHER): Payer: Self-pay

## 2023-02-14 ENCOUNTER — Other Ambulatory Visit: Payer: Self-pay

## 2023-02-14 ENCOUNTER — Other Ambulatory Visit (HOSPITAL_BASED_OUTPATIENT_CLINIC_OR_DEPARTMENT_OTHER): Payer: Self-pay

## 2023-02-15 ENCOUNTER — Other Ambulatory Visit (HOSPITAL_COMMUNITY): Payer: Self-pay

## 2023-02-15 ENCOUNTER — Other Ambulatory Visit (HOSPITAL_BASED_OUTPATIENT_CLINIC_OR_DEPARTMENT_OTHER): Payer: Self-pay

## 2023-02-16 NOTE — Progress Notes (Signed)
Remote pacemaker transmission.   

## 2023-02-17 ENCOUNTER — Encounter (HOSPITAL_BASED_OUTPATIENT_CLINIC_OR_DEPARTMENT_OTHER): Payer: Self-pay | Admitting: Pulmonary Disease

## 2023-02-17 ENCOUNTER — Ambulatory Visit (INDEPENDENT_AMBULATORY_CARE_PROVIDER_SITE_OTHER): Payer: HMO | Admitting: Pulmonary Disease

## 2023-02-17 VITALS — BP 124/80 | HR 89 | Resp 16 | Ht 66.0 in | Wt 267.4 lb

## 2023-02-17 DIAGNOSIS — J4530 Mild persistent asthma, uncomplicated: Secondary | ICD-10-CM | POA: Diagnosis not present

## 2023-02-17 NOTE — Patient Instructions (Signed)
Mild persistent asthma - well controlled --CONTINUE Advair 115-21 mcg TWO puff in the morning and evening  --CONTINUE Albuterol AS NEEDED for shortness of breath or wheezing  Asthma Action Plan Increase Albuterol 2 puffs three times a day for worsening shortness of breath, wheezing and cough. If you symptoms do not improve in 24-48 hours, please our office for evaluation and/or prednisone taper.

## 2023-02-17 NOTE — Progress Notes (Signed)
Subjective:   PATIENT ID: Mary Mcfarland GENDER: female DOB: 02-Jul-1955, MRN: [id number]   HPI  Chief Complaint  Patient presents with   Follow-up    Reason for Visit: Follow-up  Ms. Mary Mcfarland is a 67 year old female former smoker with childhood asthma, OSA, atrial flutter/fibrillation, sinus pauses s/p PM 2021, HLD and GERD who presents for follow-up for follow-up for chronic cough, asthma.  Synopsis: Referred by Dr. Tenny Craw at [facility] [place] reviewed. Note 07/27/21 reports persistent dyspnea. Prescribed albuterol She reports longstanding shortness of breath that has persisted even after her PM.  Five years ago she reports normal activity and being able to walk upstairs. Very rarely used her albuterol inhaler. Is not on any maintenance inhalers. Denies coughing and wheezing. But since her PM her activity is more limited. Worsened around animal dander, pollen. She is currently not active due to knee pain.   10/14/21 Since our last visit she has persistent shortness of breath with exertion. Seems to have persistent after her atrial fibrillation started. Denies cough and wheezing. Rarely uses albuterol.   12/21/22 Since our last visit she has continued to have on and off cough with productive sputum and wheezing. Had URI in Feb/March that may have last 8 weeks. Uses albuterol during her sick periods. Currently has cough that started a few days ago. Felt well between March and July but did have some allergies. Reports Breo did not help due to the powdery taste. Denies shortness of breath. She has sleep apnea and has oral compliance device.  02/17/23 Since our last visit she was started on Advair 115-21 mcg and she feels the inhaler has helped with her cough and wheezing. Now having minimal sputum in the morning. Has had afib for a few weeks and feeling short of breath but spontaneously went into NSR prior to cardioversion. No exacerbations since our last visit.  Asthma  Control Test ACT Total Score  02/17/2023  9:04 AM 24   Social History: Former smoker. Smoked 1 ppd. Started at 18 and stopped at 40 years. Quit in 25 years ago  Past Medical History:  Diagnosis Date   Anticoagulant long-term use    eliquis--- managed by cardiology   Asthma, mild persistent    pulmonologist--- dr Irma Newness;  (04-08-2022  pt stated last rescue inhaler use 2 months ago)   Bipolar 2 disorder (HCC)    Cardiac pacemaker in situ 02/06/2020   for tachy-brady syndrome and symptomatic bradycardia;   [employer]  (ep cardiologist--- dr Johney Frame)   Cystocele with rectocele    Difficult intubation 03/2016   04-08-2022  pt stated was scheduled for achilles tendon repair @ [facility] prior to when was done @ [facility] on 10-03-207,  when she woke up was told by nurse the surgery was note done due to Narrow airway and did not have what was needed so surgery was rescheduled (no documentation of any type in epic about this incident) per anesthesia record from 03-15-2016 pt had no issues and pt stated no issues   Diverticulosis of colon    DOE (dyspnea on exertion)    pt stated due to aftrial fib, sob,  is able to do household chores but with long distance walking and stairs get sob   Edema of both lower extremities    GERD (gastroesophageal reflux disease)    History of adenomatous polyp of colon    History of diverticulitis of colon    Hyperlipidemia  Hypertension    Longstanding persistent atrial fibrillation [facility]) 2017   cardiologist-- dr c. Hughie Closs;  for dx 2017 the reacurrance 01/ 2021;  s/p DCCV 07-24-2019;  admitted for tikosyn loading 03/ 2021;  sss s/p dual PPM 02-06-2020   MDD (major depressive disorder)    OA (osteoarthritis)    OSA (obstructive sleep apnea)    sleep study in epic 04-06-2021 OSA and nocturnal hypoxemia, per pt was unable to tolerate bipap recommended but does have appointment to get dental appliance   Pelvic prolapse    PONV (postoperative nausea and vomiting)     Pre-diabetes    Sciatica of right side    SSS (sick sinus syndrome) (HCC)    Thyroid nodule    followed by pcp;  last ultrasound in epic 12-09-2011 pt stated never had bx done   Vitamin D deficiency    Wears glasses      Family History  Problem Relation Age of Onset   Heart disease Mother    CVA Mother    Hypertension Mother    Cancer Mother    Stroke Mother    Depression Mother    Bipolar disorder Mother    Obesity Mother    Heart disease Father    Hypertension Father    Heart attack Father    Diabetes Father    Liver disease Father    Alcoholism Father    Drug abuse Father    Obesity Father      Social History   Occupational History   Occupation: retired  Tobacco Use   Smoking status: Former    Current packs/day: 0.00    Average packs/day: 1 pack/day for 22.0 years (22.0 ttl pk-yrs)    Types: Cigarettes    Start date: 02/12/1974    Quit date: 02/13/1996    Years since quitting: 27.0    Passive exposure: Past   Smokeless tobacco: Never  Vaping Use   Vaping status: Never Used  Substance and Sexual Activity   Alcohol use: Not Currently    Comment: occassionally   Drug use: Never   Sexual activity: Not Currently    Allergies  Allergen Reactions   Niacin Hives     Outpatient Medications Prior to Visit  Medication Sig Dispense Refill   albuterol (VENTOLIN HFA) 108 (90 Base) MCG/ACT inhaler Inhale 2 puffs into the lungs every 6 (six) hours as needed for wheezing or shortness of breath. 8 g 2   apixaban (ELIQUIS) 5 MG TABS tablet Take 1 tablet (5 mg total) by mouth 2 (two) times daily. 180 tablet 1   atorvastatin (LIPITOR) 40 MG tablet Take 1 tablet (40 mg total) by mouth daily. 90 tablet 3   diltiazem (DILT-XR) 120 MG 24 hr capsule Take 1 capsule (120 mg total) by mouth daily. 90 capsule 3   dofetilide (TIKOSYN) 250 MCG capsule Take 1 capsule (250 mcg total) by mouth 2 (two) times daily. 180 capsule 3   fluticasone-salmeterol (ADVAIR HFA) 115-21 MCG/ACT  inhaler Inhale 2 puffs into the lungs 2 (two) times daily. 12 g 5   furosemide (LASIX) 40 MG tablet Take 1 tablet (40 mg total) by mouth daily. 90 tablet 3   lamoTRIgine (LAMICTAL) 100 MG tablet Take 1 tablet (100 mg total) by mouth daily. 90 tablet 0   lisinopril (ZESTRIL) 10 MG tablet Take 1 tablet (10 mg total) by mouth daily. 90 tablet 4   potassium chloride (KLOR-CON) 10 MEQ tablet Take 1 tablet (10 mEq total) by mouth daily.  90 tablet 1   valACYclovir (VALTREX) 1000 MG tablet Take 1 tablet (1,000 mg total) by mouth 3 (three) times daily. (Patient taking differently: Take 1,000 mg by mouth 3 (three) times daily as needed (shingles).) 21 tablet 0   Wheat Dextrin (BENEFIBER) POWD Take 1 Dose by mouth daily.     zolpidem (AMBIEN) 10 MG tablet Take 0.5-1 tablets (5-10 mg total) by mouth at bedtime as needed. 30 tablet 2   doxycycline (VIBRAMYCIN) 100 MG capsule Take 1 capsule (100 mg total) by mouth 2 (two) times daily. 20 capsule 1   lisinopril (ZESTRIL) 10 MG tablet Take 10 mg by mouth daily.     zolpidem (AMBIEN) 10 MG tablet Take 10 mg by mouth at bedtime.     No facility-administered medications prior to visit.    Review of Systems  Constitutional:  Negative for chills, diaphoresis, fever, malaise/fatigue and weight loss.  HENT:  Negative for congestion.   Respiratory:  Positive for sputum production. Negative for cough, hemoptysis, shortness of breath and wheezing.   Cardiovascular:  Negative for chest pain, palpitations and leg swelling.     Objective:   Vitals:   02/17/23 0838  BP: 124/80  Pulse: 89  Resp: 16  SpO2: 95%  Weight: 267 lb 6.4 oz (121.3 kg)  Height: 5\' 6"  (1.676 m)   SpO2: 95 %  Physical Exam: General: Well-appearing, no acute distress HENT: [place], AT Eyes: EOMI, no scleral icterus Respiratory: Clear to auscultation bilaterally.  No crackles, wheezing or rales Cardiovascular: RRR, -M/R/G, no JVD Extremities:-Edema,-tenderness Neuro: AAO x4, CNII-XII  grossly intact Psych: Normal mood, normal affect  Data Reviewed:  Imaging: CT Cardiac 07/01/19 - Visualized lung parenchyma is normal with no interstitial changes, ground glass or nodules/masses  CXR 08/23/21 - No acute issues CT Coronary 10/17/22 - Mucoid impaction associated with peribronchovascular nodularity and subpleural consolidation in RLL CT Chest 12/06/22 - Improved mucoid impaction compared to CT Coronary 10/17/22 parenchymal findings  PFT: 10/13/21 FVC 2.51 (75%) FEV1 1.96 (76%) Ratio 77 DLCO 130% Interpretation: No obstructive defect on spirometry. Partial bronchodilator response however not significant. This does not preclude benefit of bronchodilator response. Elevated DLCO may also suggest. Clinically correlate.  Labs: CBC    Component Value Date/Time   WBC 11.2 (H) 01/17/2023 0913   RBC 5.26 (H) 01/17/2023 0913   HGB 13.7 01/17/2023 0913   HGB 13.7 04/19/2021 1052   HGB 14.0 02/15/2007 1501   HCT 44.1 01/17/2023 0913   HCT 41.0 04/19/2021 1052   HCT 40.0 02/15/2007 1501   PLT 334 01/17/2023 0913   PLT 352 04/19/2021 1052   MCV 83.8 01/17/2023 0913   MCV 85 04/19/2021 1052   MCV 87.2 02/15/2007 1501   MCH 26.0 01/17/2023 0913   MCHC 31.1 01/17/2023 0913   RDW 14.6 01/17/2023 0913   RDW 12.3 04/19/2021 1052   RDW 12.9 02/15/2007 1501   LYMPHSABS 2.6 01/21/2020 1648   LYMPHSABS 2.8 02/15/2007 1501   MONOABS 1.3 (H) 03/26/2014 2005   MONOABS 0.9 02/15/2007 1501   EOSABS 0.3 01/21/2020 1648   BASOSABS 0.1 01/21/2020 1648   BASOSABS 0.0 02/15/2007 1501   Absolute eos 01/21/20 - 300     Assessment & Plan:   Discussion: 67 year female former smoker with childhood asthma, OSA, atrial flutter, sinus pauses s/p PM 2021, HLD and GERD who presents for follow-up. Asthma well controlled. Discussed clinical course and management of asthma including bronchodilator regimen, preventive care including vaccinations and action plan for exacerbation.  Mild persistent asthma -  well controlled --CONTINUE Advair 115-21 mcg TWO puff in the morning and evening  --CONTINUE Albuterol AS NEEDED for shortness of breath or wheezing  Asthma Action Plan Increase Albuterol 2 puffs three times a day for worsening shortness of breath, wheezing and cough. If you symptoms do not improve in 24-48 hours, please our office for evaluation and/or prednisone taper.  OSA on oral compliance device Low-normal oxygen --Recommend overnight oxygen testing. Decline for now  Health Maintenance Immunization History  Administered Date(s) Administered   Influenza-Unspecified 01/14/2020   PFIZER(Purple Top)SARS-COV-2 Vaccination 10/05/2019, 10/28/2019   Td 06/14/2003   Tdap 12/06/2011   Zoster, Live 02/15/2018, 07/22/2020   CT Lung Screen - not qualified. >15 years since patient smoked  No orders of the defined types were placed in this encounter.  No orders of the defined types were placed in this encounter.   Return in about 6 months (around 08/17/2023).   I have spent a total time of 30-minutes on the day of the appointment including chart review, data review, collecting history, coordinating care and discussing medical diagnosis and plan with the patient/family. Past medical history, allergies, medications were reviewed. Pertinent imaging, labs and tests included in this note have been reviewed and interpreted independently by me.  Norfleet Capers Mechele Collin, MD [facility] 02/17/2023 9:05 AM  Office Number [phone]

## 2023-02-20 ENCOUNTER — Other Ambulatory Visit (HOSPITAL_BASED_OUTPATIENT_CLINIC_OR_DEPARTMENT_OTHER): Payer: Self-pay

## 2023-02-20 DIAGNOSIS — M1711 Unilateral primary osteoarthritis, right knee: Secondary | ICD-10-CM | POA: Diagnosis not present

## 2023-02-20 DIAGNOSIS — Z86010 Personal history of colonic polyps: Secondary | ICD-10-CM | POA: Diagnosis not present

## 2023-02-20 DIAGNOSIS — R194 Change in bowel habit: Secondary | ICD-10-CM | POA: Diagnosis not present

## 2023-02-20 DIAGNOSIS — R1084 Generalized abdominal pain: Secondary | ICD-10-CM | POA: Diagnosis not present

## 2023-02-20 MED ORDER — DICYCLOMINE HCL 20 MG PO TABS
20.0000 mg | ORAL_TABLET | Freq: Three times a day (TID) | ORAL | 1 refills | Status: DC | PRN
  Filled 2023-02-20: qty 90, 30d supply, fill #0

## 2023-02-21 ENCOUNTER — Other Ambulatory Visit (HOSPITAL_BASED_OUTPATIENT_CLINIC_OR_DEPARTMENT_OTHER): Payer: Self-pay

## 2023-02-22 ENCOUNTER — Other Ambulatory Visit: Payer: Self-pay

## 2023-02-23 ENCOUNTER — Ambulatory Visit (INDEPENDENT_AMBULATORY_CARE_PROVIDER_SITE_OTHER): Payer: HMO | Admitting: Licensed Clinical Social Worker

## 2023-02-23 DIAGNOSIS — F331 Major depressive disorder, recurrent, moderate: Secondary | ICD-10-CM

## 2023-02-23 DIAGNOSIS — F3181 Bipolar II disorder: Secondary | ICD-10-CM

## 2023-02-23 DIAGNOSIS — F5102 Adjustment insomnia: Secondary | ICD-10-CM

## 2023-02-23 NOTE — Progress Notes (Signed)
Virtual Visit via Video Note  I connected with Leveda Caras Mcfarland on 02/23/23 at  9:00 AM EDT by a video enabled telemedicine application and verified that I am speaking with the correct person using two identifiers.  Location: Patient: home Provider: home office   I discussed the limitations of evaluation and management by telemedicine and the availability of in person appointments. The patient expressed understanding and agreed to proceed.   I discussed the assessment and treatment plan with the patient. The patient was provided an opportunity to ask questions and all were answered. The patient agreed with the plan and demonstrated an understanding of the instructions.   The patient was advised to call back or seek an in-person evaluation if the symptoms worsen or if the condition fails to improve as anticipated.  I provided 48 minutes of non-face-to-face time during this encounter.  THERAPIST PROGRESS NOTE  Session Time: 9:00 AM to 9:48 AM  Participation Level: Active  Behavioral Response: CasualAlertappropriate  Type of Therapy: Individual Therapy  Treatment Goals addressed:  learning to say no in relationships, addressing depressive issues, work on being more active and motivated, coping  ProgressTowards Goals: Progressing-friend was not treating her daughter arrived patient was able to say no separate from relationship indicating progress with treatment goals also discussed other positive developments helpful for mood  Interventions: Solution Focused, Strength-based, Supportive, and Other: coping  Summary: Mary Mcfarland is a 67 y.o. female who presents with better now. Was at aunt's taking care of her after shoulder surgery. Patient there close to a month stopped taking meds and explained to therapist why tired of taking so many meds. Stopped taking bipolar meds turned into "an evil bitch". Family told her need to address.  Went into a-fib for 21/2 weeks normally  can't tell but her watch told her also start to get headache and dizzy. Called cardiologist scheduled for cardioversion but heart went back into regular rhythm so didn't need. Patient shared history with this, 2020/21-she got into afib clinic, had a cardioversion didn't work had to go to hospital take medication to help heart back in normal rhyme. She went back to regular cardiologist for pacemaker As a result this recent episode she says a lot going on a lot going on in her head. .Once in awhile heart goes into a-fib has medications to help with that.   Back on Lamictal and slowly increasing.  Wants to get in senior building, brand new building, elevator, low income. Talked about where live an old apartment stressors living in an old apartment.  Son and daughter-in-law came here the middle of August. They are aunt's for 6 months. It helps her she is going to have other shoulder done. When patient had to help her shower. Her husband passed in June last were together 57 years. Always been close she is like mom, sister, best friend always been in life changed patient's diapers and change daughter's children diapers. Very nurturing.  Son started own company doing what he did at last job logistics with fruit. Hoping he is going to stay.  Patient said in her family it was Dad Mary Mcfarland-was going to come but ended up dying super excited to come here aunt Justice Deeds,  aunt Elita Quick that is here. Always close with family.  Major thing happened her friend Erskine Squibb. She had mental stuff go on for a long time. Somebody would get snippy with text. Sent one and didn't talk to her let her think about it. Comes to holidays  with patient's family. Patient said if somebody offering to join in you think would be nice. It got so bad she had the nerve because patient didn't talk to her reached out to aunt would like to see grandchildren patient said absolutely not talk to her daughter, Morrie Sheldon. Would have let her come until did it again  horrible things to daughter. Patient blocked her own phone when Morrie Sheldon told her unblocked and told her how dare you to speak to my daughter. Told her she is the "bitch" and that was it have a nice life.Watched her write friends off that had for a long time. Morrie Sheldon best friend she reached out to her offering to pick up her daughter.  Again very odd does not know her best friend like that.  Reviewed session there is a lot to catch up on a lot going on in patient's life  Therapist has not seen patient since June so we reviewed any significant changes in symptoms any significant events.  Patient had a lot to share so processed through thoughts and feelings noting stressors such as being in A-fib for 2-1/2 weeks, estrangement with her friend.  The positive being her son is here now hopes that he stays.  Talked about regular stressors such as apartment which is old has issues would love to move into new apartments in [place] would be close to her aunt and goes there all the time.  Therapist utilized strength-based and supportive validating patient on different issues where she is struggling, making healthy choices in her relationships, assess therapeutic for patient to externalize and process feelings and thoughts Suicidal/Homicidal: No  Plan: Return again in 3 weeks.2.ontinue with plan-Review with patient any insights from journaling introduce patient CBT through thoughts and feelings block.3.  His thoughts and feelings in session to help with coping  Diagnosis:  Bipolar 2, major depressive disorder recurrent moderate, adjustment insomnia  Collaboration of Care: Other none needed  Patient/Guardian was advised Release of Information must be obtained prior to any record release in order to collaborate their care with an outside provider. Patient/Guardian was advised if they have not already done so to contact the registration department to sign all necessary forms in order for [place] to release information  regarding their care.   Consent: Patient/Guardian gives verbal consent for treatment and assignment of benefits for services provided during this visit. Patient/Guardian expressed understanding and agreed to proceed.   Coolidge Breeze, LCSW 02/23/2023

## 2023-02-27 ENCOUNTER — Other Ambulatory Visit: Payer: Self-pay

## 2023-02-27 ENCOUNTER — Other Ambulatory Visit (HOSPITAL_COMMUNITY): Payer: Self-pay

## 2023-03-07 ENCOUNTER — Encounter: Payer: Self-pay | Admitting: Cardiology

## 2023-03-07 ENCOUNTER — Telehealth (HOSPITAL_COMMUNITY): Payer: Self-pay | Admitting: *Deleted

## 2023-03-07 NOTE — Telephone Encounter (Signed)
Patient called in stating she converted back into AFib last night. Heart rates are in the 120s. Discussed with Jorja Loa PA will increase cardizem to 120mg  BID and see if this will convert her to NSR as it has in the past. If still in afib end of week she will call back and we will bring in for planning of cardioversion. Pt in agreement.

## 2023-03-09 ENCOUNTER — Other Ambulatory Visit: Payer: Self-pay | Admitting: Family Medicine

## 2023-03-09 DIAGNOSIS — E2839 Other primary ovarian failure: Secondary | ICD-10-CM

## 2023-03-10 ENCOUNTER — Telehealth: Payer: Self-pay

## 2023-03-10 NOTE — Telephone Encounter (Signed)
Alert received from [employer]:  Alert remote transmission: Exceeded AT/AF Burden. Long AT/AF Episode(s). Ongoing AF since 03/05/23 at 08:51 pm. V rates >110 bpm ~40%. On Eliquis and Tikosyn per Epic. Routed to clinic for review.  Follow up as scheduled.  Message sent to afib clinic to advise.

## 2023-03-13 NOTE — Telephone Encounter (Signed)
Appt made

## 2023-03-15 ENCOUNTER — Encounter (HOSPITAL_COMMUNITY): Payer: Self-pay | Admitting: Physician Assistant

## 2023-03-15 ENCOUNTER — Ambulatory Visit (HOSPITAL_COMMUNITY)
Admission: RE | Admit: 2023-03-15 | Discharge: 2023-03-15 | Disposition: A | Discharge status: Home / Self Care | Payer: HMO | Source: Ambulatory Visit | Attending: Physician Assistant | Admitting: Physician Assistant

## 2023-03-15 ENCOUNTER — Other Ambulatory Visit: Payer: Self-pay

## 2023-03-15 ENCOUNTER — Other Ambulatory Visit (HOSPITAL_COMMUNITY): Payer: Self-pay

## 2023-03-15 VITALS — BP 130/68 | HR 74 | Ht 66.0 in | Wt 264.6 lb

## 2023-03-15 DIAGNOSIS — Z6841 Body Mass Index (BMI) 40.0 and over, adult: Secondary | ICD-10-CM | POA: Diagnosis not present

## 2023-03-15 DIAGNOSIS — Z950 Presence of cardiac pacemaker: Secondary | ICD-10-CM | POA: Diagnosis not present

## 2023-03-15 DIAGNOSIS — I4819 Other persistent atrial fibrillation: Secondary | ICD-10-CM | POA: Diagnosis not present

## 2023-03-15 DIAGNOSIS — D6869 Other thrombophilia: Secondary | ICD-10-CM | POA: Insufficient documentation

## 2023-03-15 DIAGNOSIS — E669 Obesity, unspecified: Secondary | ICD-10-CM | POA: Insufficient documentation

## 2023-03-15 DIAGNOSIS — R001 Bradycardia, unspecified: Secondary | ICD-10-CM | POA: Diagnosis not present

## 2023-03-15 DIAGNOSIS — I10 Essential (primary) hypertension: Secondary | ICD-10-CM | POA: Insufficient documentation

## 2023-03-15 DIAGNOSIS — Z7901 Long term (current) use of anticoagulants: Secondary | ICD-10-CM | POA: Insufficient documentation

## 2023-03-15 DIAGNOSIS — G4733 Obstructive sleep apnea (adult) (pediatric): Secondary | ICD-10-CM | POA: Insufficient documentation

## 2023-03-15 DIAGNOSIS — I4811 Longstanding persistent atrial fibrillation: Secondary | ICD-10-CM | POA: Insufficient documentation

## 2023-03-15 DIAGNOSIS — I484 Atypical atrial flutter: Secondary | ICD-10-CM | POA: Insufficient documentation

## 2023-03-15 DIAGNOSIS — I480 Paroxysmal atrial fibrillation: Secondary | ICD-10-CM

## 2023-03-15 MED ORDER — DILTIAZEM HCL ER 120 MG PO CP24
120.0000 mg | ORAL_CAPSULE | Freq: Two times a day (BID) | ORAL | 2 refills | Status: DC
Start: 2023-03-15 — End: 2023-11-14
  Filled 2023-03-15 – 2023-04-07 (×4): qty 180, 90d supply, fill #0
  Filled 2023-04-10: qty 60, 30d supply, fill #0
  Filled 2023-05-05: qty 180, 90d supply, fill #1

## 2023-03-15 MED FILL — Apixaban Tab 5 MG: ORAL | 90 days supply | Qty: 180 | Fill #0 | Status: AC

## 2023-03-15 NOTE — Progress Notes (Signed)
Primary Care Physician: Daisy Floro, MD Primary Cardiologist: Dr Cristal Deer  Primary Electrophysiologist: Dr Lalla Brothers Referring Physician: Dr Chyrel Masson is a 67 y.o. female with a history of HTN, bipolar disorder, symptomatic bradycardia s/p PPM, and persistent atrial fibrillation who presents for follow up in the [facility]. She was first diagnosed with atrial fibrillation in 2017 and then recurrence found in January of this year at PCP visit. She went in for evaluation of headaches, dizziness.  She was placed on BB and Eliquis.  She underwent attempted cardioversion in February which failed to restore SR. Patient is on Eliquis for a CHADS2VASC score of 3. She was evaluated by Dr Johney Frame on 08/09/19 who recommended dofetilide. Patient is s/p dofetilide loading 3/16-3/19/21. She did not require DCCV. Patient was seen at her PCP office on 11/21/19 and found to be bradycardic HR 44 with PACs. She was also noted to have a 3 second pause on rhythm strip. Her BB was stopped. Patient reports that her SOB has improved off of metoprolol. Patient presented for DCCV on 12/06/19 and was found to be in SR, procedure was cancelled.   On follow up today, patient called the clinic with afib that started on 9/23. Her diltiazem was increased to 120 mg BID. She states that she converted to SR this AM. She is compliant with her oral appliance for OSA. No bleeding issues on anticoagulation.   Today, she denies symptoms of palpitations, chest pain, orthopnea, PND, presyncope, syncope, bleeding, or neurologic sequela. The patient is tolerating medications without difficulties and is otherwise without complaint today.    Atrial Fibrillation Risk Factors:  she does have symptoms or diagnosis of sleep apnea.  she does not have a history of rheumatic fever. she does not have a history of alcohol use.   Atrial Fibrillation Management history:  Previous  antiarrhythmic drugs: dofetilide Previous cardioversions: 07/24/19 Previous ablations: none Anticoagulation history: Eliquis   Past Medical History:  Diagnosis Date   Anticoagulant long-term use    eliquis--- managed by cardiology   Asthma, mild persistent    pulmonologist--- dr Irma Newness;  (04-08-2022  pt stated last rescue inhaler use 2 months ago)   Bipolar 2 disorder (HCC)    Cardiac pacemaker in situ 02/06/2020   for tachy-brady syndrome and symptomatic bradycardia;   [employer]  (ep cardiologist--- dr Johney Frame)   Cystocele with rectocele    Difficult intubation 03/2016   04-08-2022  pt stated was scheduled for achilles tendon repair @ [facility] prior to when was done @ [facility] on 10-03-207,  when she woke up was told by nurse the surgery was note done due to Narrow airway and did not have what was needed so surgery was rescheduled (no documentation of any type in epic about this incident) per anesthesia record from 03-15-2016 pt had no issues and pt stated no issues   Diverticulosis of colon    DOE (dyspnea on exertion)    pt stated due to aftrial fib, sob,  is able to do household chores but with long distance walking and stairs get sob   Edema of both lower extremities    GERD (gastroesophageal reflux disease)    History of adenomatous polyp of colon    History of diverticulitis of colon    Hyperlipidemia    Hypertension    Longstanding persistent atrial fibrillation [facility]) 2017   cardiologist-- dr c. Hughie Closs;  for dx 2017 the reacurrance 01/ 2021;  s/p DCCV 07-24-2019;  admitted for tikosyn loading 03/ 2021;  sss s/p dual PPM 02-06-2020   MDD (major depressive disorder)    OA (osteoarthritis)    OSA (obstructive sleep apnea)    sleep study in epic 04-06-2021 OSA and nocturnal hypoxemia, per pt was unable to tolerate bipap recommended but does have appointment to get dental appliance   Pelvic prolapse    PONV (postoperative nausea and vomiting)    Pre-diabetes    Sciatica of  right side    SSS (sick sinus syndrome) (HCC)    Thyroid nodule    followed by pcp;  last ultrasound in epic 12-09-2011 pt stated never had bx done   Vitamin D deficiency    Wears glasses     ROS- All systems are reviewed and negative except as per the HPI above.  Physical Exam: Vitals:   03/15/23 0911  BP: 130/68  Pulse: 74  Weight: 120 kg  Height: 5\' 6"  (1.676 m)     GEN: Well nourished, well developed in no acute distress NECK: No JVD; No carotid bruits CARDIAC: Regular rate and rhythm, no murmurs, rubs, gallops RESPIRATORY:  Clear to auscultation without rales, wheezing or rhonchi  ABDOMEN: Soft, non-tender, non-distended EXTREMITIES:  No edema; No deformity    Wt Readings from Last 3 Encounters:  03/15/23 120 kg  02/17/23 121.3 kg  02/06/23 119.8 kg    EKG today demonstrates  SR, RBBB, 1st degree AV block Vent. rate 74 BPM PR interval 204 ms QRS duration 162 ms QT/QTcB 446/495 ms   Echo 07/01/19 demonstrated  1. Left ventricular ejection fraction, by visual estimation, is 60 to  65%. The left ventricle has normal function. There is mildly increased  left ventricular hypertrophy.   2. Left ventricular diastolic parameters are indeterminate.   3. The left ventricle has no regional wall motion abnormalities.   4. Global right ventricle has normal systolic function.The right  ventricular size is normal. No increase in right ventricular wall  thickness.   5. Left atrial size was moderately dilated.   6. Right atrial size was normal.   7. The mitral valve is normal in structure. Trivial mitral valve  regurgitation. No evidence of mitral stenosis.   8. The tricuspid valve is normal in structure.   9. The tricuspid valve is normal in structure. Tricuspid valve  regurgitation is not demonstrated.  10. The aortic valve is tricuspid. Aortic valve regurgitation is not  visualized. Mild to moderate aortic valve sclerosis/calcification without  any evidence of aortic  stenosis.  11. Calcified non coronary cusp.  12. The pulmonic valve was normal in structure. Pulmonic valve  regurgitation is not visualized.  13. The inferior vena cava is normal in size with greater than 50%  respiratory variability, suggesting right atrial pressure of 3 mmHg.   Epic records are reviewed at length today  CHA2DS2-VASc Score = 3  The patient's score is based upon: CHF History: 0 HTN History: 1 Diabetes History: 0 Stroke History: 0 Vascular Disease History: 0 Age Score: 1 Gender Score: 1       ASSESSMENT AND PLAN: Persistent Atrial Fibrillation/atypical atrial flutter The patient's CHA2DS2-VASc score is 3, indicating a 3.2% annual risk of stroke.   Patient back in SR. We discussed rhythm control options today.  Will continue increased dose of diltiazem 120 mg BID. If she continues to have frequent afib, could consider ablation. We did discuss that the ablation is more likely to be successful with a BMI <  40.  Continue dofetilide 250 mcg BID, QT stable Continue Eliquis 5 mg BID  Secondary Hypercoagulable State (ICD10:  D68.69) The patient is at significant risk for stroke/thromboembolism based upon her CHA2DS2-VASc Score of 3.  Continue Apixaban (Eliquis).   Obesity Body mass index is 42.71 kg/m.  Encouraged lifestyle modification  OSA  Patient uses an oral appliance She did not tolerate BiPap  HTN Stable on current regimen  Symptomatic bradycardia S/p PPM, followed by Dr Lalla Brothers and the device clinic   Follow up with Francis Dowse as scheduled. AF clinic in 6 months.    Jorja Loa PA-C Af[facility] [address] [place], [place] [address] [phone] 03/15/2023 9:46 AM

## 2023-03-15 NOTE — Patient Instructions (Addendum)
Continue cardizem to 120mg  twice a day

## 2023-03-16 ENCOUNTER — Encounter: Payer: Self-pay | Admitting: Pharmacist

## 2023-03-16 ENCOUNTER — Other Ambulatory Visit (HOSPITAL_COMMUNITY): Payer: Self-pay

## 2023-03-16 ENCOUNTER — Other Ambulatory Visit: Payer: Self-pay

## 2023-03-23 ENCOUNTER — Ambulatory Visit (HOSPITAL_COMMUNITY): Payer: HMO | Admitting: Licensed Clinical Social Worker

## 2023-03-23 DIAGNOSIS — M79671 Pain in right foot: Secondary | ICD-10-CM | POA: Diagnosis not present

## 2023-03-23 DIAGNOSIS — F5102 Adjustment insomnia: Secondary | ICD-10-CM

## 2023-03-23 DIAGNOSIS — F331 Major depressive disorder, recurrent, moderate: Secondary | ICD-10-CM

## 2023-03-23 DIAGNOSIS — F3181 Bipolar II disorder: Secondary | ICD-10-CM | POA: Diagnosis not present

## 2023-03-23 NOTE — Progress Notes (Signed)
Virtual Visit via Video Note  I connected with Mary Mcfarland on 03/23/23 at  8:00 AM EDT by a video enabled telemedicine application and verified that I am speaking with the correct person using two identifiers.  Location: Patient: home Provider: home office   I discussed the limitations of evaluation and management by telemedicine and the availability of in person appointments. The patient expressed understanding and agreed to proceed.  I discussed the assessment and treatment plan with the patient. The patient was provided an opportunity to ask questions and all were answered. The patient agreed with the plan and demonstrated an understanding of the instructions.   The patient was advised to call back or seek an in-person evaluation if the symptoms worsen or if the condition fails to improve as anticipated.  I provided 45 minutes of non-face-to-face time during this encounter.  THERAPIST PROGRESS NOTE  Session Time: 8:00 AM to 8:45 AM  Participation Level: Active  Behavioral Response: CasualAlertEuthymic  Type of Therapy: Individual Therapy  Treatment Goals addressed: rning to say no in relationships, addressing depressive issues, work on being more active and motivated, coping  ProgressTowards Goals: Progressing-patient really indicates patient making progress on goals of being more active and motivated she is moving feels very positive about that starting to make plans for travel which is how she envisions her life in the future  Interventions: Solution Focused, Strength-based, Supportive, and Other: coping  Summary: Mary Mcfarland is a 67 y.o. female who presents with has really good news she is moving. Anxiety level will drop significantly due to bugs at her place. It has been ok only two bugs over the summer but when see them it is anxiety.  Therapist relates to that reaction.  Three minutes from her aunt new apartments. Rent based on income. Has a gym.  Talked  about crystals she has that give good energy, energy cards thinks they help. Shared what the card said for today. Break up of toxic thoughts have a clearer vision vision of what you want and what you want daily life to look like. Meditate on this. Creating a new clarity and sense of direction power to create that. Therapist. asked how sees life wants to get settled so can travel. Got her passport. Looking at [employer].  Both patient and therapist enthusiastic about travel Proud of daughter she went to [place] a really good experience. She was a Runner, broadcasting/film/video. Doing some packing because she knows it is coming.   Assess good session a lot of laughing about different things humor is helpful in enhancing mood helping with perspective with everything going on in the world.  Patient was able to share some good news moving to a new place therapist very excited for her knowing where she is now one of the downsides is the bugs we had a good laugh about both of [place] being highly sensitive to bugs and how anxiety producing they are so for that reason we will be good should be close to family as well.  Patient has talked about her energy card finally shared it so therapist has an idea she says it helps we talked about what it said what is her vision how she sees her life which God is talking about traveling and she is already taking steps to start process so positive.  Able to share some stories that mean a lot for example her daughter living for 2 years in [place] being very proud of her talked about moving  which led him to keep sakes from her loved ones and how meaningful they are.  Therapist provided strength and support while processing patient's thoughts and feelings.   Suicidal/Homicidal: No  Plan: Return again in 4 weeks.2.Continue with plan-Review with patient any insights from journaling introduce patient CBT through thoughts and feelings block.3.  His thoughts and feelings in session to help with  coping  Diagnosis: Bipolar 2, major depressive disorder recurrent moderate, adjustment insomnia  Collaboration of Care: Other none needed  Patient/Guardian was advised Release of Information must be obtained prior to any record release in order to collaborate their care with an outside provider. Patient/Guardian was advised if they have not already done so to contact the registration department to sign all necessary forms in order for [place] to release information regarding their care.   Consent: Patient/Guardian gives verbal consent for treatment and assignment of benefits for services provided during this visit. Patient/Guardian expressed understanding and agreed to proceed.   Coolidge Breeze, LCSW 03/23/2023

## 2023-03-27 ENCOUNTER — Other Ambulatory Visit: Payer: Self-pay

## 2023-03-27 ENCOUNTER — Other Ambulatory Visit (HOSPITAL_COMMUNITY): Payer: Self-pay

## 2023-03-30 DIAGNOSIS — S86011D Strain of right Achilles tendon, subsequent encounter: Secondary | ICD-10-CM | POA: Diagnosis not present

## 2023-04-04 DIAGNOSIS — S86011D Strain of right Achilles tendon, subsequent encounter: Secondary | ICD-10-CM | POA: Diagnosis not present

## 2023-04-05 ENCOUNTER — Encounter (HOSPITAL_COMMUNITY): Payer: Self-pay | Admitting: Psychiatry

## 2023-04-05 ENCOUNTER — Other Ambulatory Visit: Payer: Self-pay

## 2023-04-05 ENCOUNTER — Telehealth (INDEPENDENT_AMBULATORY_CARE_PROVIDER_SITE_OTHER): Payer: HMO | Admitting: Psychiatry

## 2023-04-05 ENCOUNTER — Other Ambulatory Visit (HOSPITAL_COMMUNITY): Payer: Self-pay

## 2023-04-05 DIAGNOSIS — F3181 Bipolar II disorder: Secondary | ICD-10-CM | POA: Diagnosis not present

## 2023-04-05 DIAGNOSIS — F5102 Adjustment insomnia: Secondary | ICD-10-CM

## 2023-04-05 MED ORDER — LAMOTRIGINE 100 MG PO TABS
100.0000 mg | ORAL_TABLET | Freq: Every day | ORAL | 0 refills | Status: DC
  Filled 2023-04-05 – 2023-05-09 (×3): qty 90, 90d supply, fill #0

## 2023-04-05 NOTE — Progress Notes (Signed)
[facility] Follow up visit   Patient Identification: Mary Mcfarland MRN:  [id number] Date of Evaluation:  04/05/2023 Referral Source: primary care Chief Complaint:  depression follow up , moodiness Visit Diagnosis:    ICD-10-CM   1. Bipolar 2 disorder (HCC)  F31.81     2. Adjustment insomnia  F51.02     Virtual Visit via Video Note  I connected with Mary Mcfarland on 04/05/23 at  1:00 PM EDT by a video enabled telemedicine application and verified that I am speaking with the correct person using two identifiers.  Location: Patient: home Provider: home office   I discussed the limitations of evaluation and management by telemedicine and the availability of in person appointments. The patient expressed understanding and agreed to proceed.      I discussed the assessment and treatment plan with the patient. The patient was provided an opportunity to ask questions and all were answered. The patient agreed with the plan and demonstrated an understanding of the instructions.   The patient was advised to call back or seek an in-person evaluation if the symptoms worsen or if the condition fails to improve as anticipated.  I provided  20  minutes of non-face-to-face time during this encounter.             History of Present Illness: Patient is a 67 years old currently single Caucasian female initially referred by primary care physician and cardiology for management and establishment of depression She has been seeing a psychiatrist for many years he closed the practice and she started seeing a psychiatrist at [facility] this year till they closed.    Follows with providers for medical conditions, AFib  Overall doing better since back on lamictal. Not taking wellbutrin  Planning to move to [place] looking forward  No rash    We have kept her off from wellbutrin for now  Sleep fair with ambien    Aggravating factors; health issues Modifying factors grand  kids Severity; fair   Patient denies drug use or alcohol use     Past Psychiatric History: Depression, possible bipolar  Previous Psychotropic Medications: Yes     Past Medical History:  Past Medical History:  Diagnosis Date   Anticoagulant long-term use    eliquis--- managed by cardiology   Asthma, mild persistent    pulmonologist--- dr Irma Newness;  (04-08-2022  pt stated last rescue inhaler use 2 months ago)   Bipolar 2 disorder (HCC)    Cardiac pacemaker in situ 02/06/2020   for tachy-brady syndrome and symptomatic bradycardia;   [employer]  (ep cardiologist--- dr Johney Frame)   Cystocele with rectocele    Difficult intubation 03/2016   04-08-2022  pt stated was scheduled for achilles tendon repair @ [facility] prior to when was done @ [facility] on 10-03-207,  when she woke up was told by nurse the surgery was note done due to Narrow airway and did not have what was needed so surgery was rescheduled (no documentation of any type in epic about this incident) per anesthesia record from 03-15-2016 pt had no issues and pt stated no issues   Diverticulosis of colon    DOE (dyspnea on exertion)    pt stated due to aftrial fib, sob,  is able to do household chores but with long distance walking and stairs get sob   Edema of both lower extremities    GERD (gastroesophageal reflux disease)    History of adenomatous polyp of colon    History of  diverticulitis of colon    Hyperlipidemia    Hypertension    Longstanding persistent atrial fibrillation [facility]) 2017   cardiologist-- dr c. Hughie Closs;  for dx 2017 the reacurrance 01/ 2021;  s/p DCCV 07-24-2019;  admitted for tikosyn loading 03/ 2021;  sss s/p dual PPM 02-06-2020   MDD (major depressive disorder)    OA (osteoarthritis)    OSA (obstructive sleep apnea)    sleep study in epic 04-06-2021 OSA and nocturnal hypoxemia, per pt was unable to tolerate bipap recommended but does have appointment to get dental appliance   Pelvic prolapse     PONV (postoperative nausea and vomiting)    Pre-diabetes    Sciatica of right side    SSS (sick sinus syndrome) (HCC)    Thyroid nodule    followed by pcp;  last ultrasound in epic 12-09-2011 pt stated never had bx done   Vitamin D deficiency    Wears glasses     Past Surgical History:  Procedure Laterality Date   ACHILLES TENDON SURGERY Right 08/02/2022   Procedure: RIGHT HEEL PUMP BUMP EXCISION AND ACHILLES TENDON REPAIR;  Surgeon: Marcene Corning, MD;  Location: [facility];  Service: Orthopedics;  Laterality: Right;   ANTERIOR AND POSTERIOR REPAIR N/A 04/14/2022   Procedure: POSTERIOR REPAIR (RECTOCELE);  Surgeon: Silverio Lay, MD;  Location: [facility];  Service: Gynecology;  Laterality: N/A;   CARDIOVERSION N/A 07/24/2019   Procedure: CARDIOVERSION;  Surgeon: Jodelle Red, MD;  Location: [facility] ENDOSCOPY;  Service: Cardiovascular;  Laterality: N/A;   COLONOSCOPY WITH PROPOFOL  11/2020   dr Marca Ancona   HEEL SPUR RESECTION Left 03/15/2016   Procedure: ACHILLIES TENDON REPAIR AND PUMP BUMP EXCISION;  Surgeon: Marcene Corning, MD;  Location: [facility] OR;  Service: Orthopedics;  Laterality: Left;   PACEMAKER IMPLANT N/A 02/06/2020   Procedure: PACEMAKER IMPLANT;  Surgeon: Hillis Range, MD;  Location: [facility] INVASIVE CV LAB;  Service: Cardiovascular;  Laterality: N/A;   PERINEOPLASTY N/A 04/14/2022   Procedure: PERINEOPLASTY;  Surgeon: Silverio Lay, MD;  Location: [facility];  Service: Gynecology;  Laterality: N/A;   SHOULDER ARTHROSCOPY Right 11/15/2005   @[facility]  by dr Jerl Santos   TOE SURGERY Right 2011   bone spur   TONSILLECTOMY  1979    Family Psychiatric History: Mom diagnosed with possible bipolar  Family History:  Family History  Problem Relation Age of Onset   Heart disease Mother    CVA Mother    Hypertension Mother    Cancer Mother    Stroke Mother    Depression Mother    Bipolar disorder Mother    Obesity Mother    Heart disease Father     Hypertension Father    Heart attack Father    Diabetes Father    Liver disease Father    Alcoholism Father    Drug abuse Father    Obesity Father     Social History:   Social History   Socioeconomic History   Marital status: Divorced    Spouse name: Not on file   Number of children: Not on file   Years of education: Not on file   Highest education level: Not on file  Occupational History   Occupation: retired  Tobacco Use   Smoking status: Former    Current packs/day: 0.00    Average packs/day: 1 pack/day for 22.0 years (22.0 ttl pk-yrs)    Types: Cigarettes    Start date: 02/12/1974    Quit date: 02/13/1996  Years since quitting: 27.1    Passive exposure: Past   Smokeless tobacco: Never  Vaping Use   Vaping status: Never Used  Substance and Sexual Activity   Alcohol use: Not Currently    Comment: occassionally   Drug use: Never   Sexual activity: Not Currently  Other Topics Concern   Not on file  Social History Narrative   Lives in [place] alone   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, clinical)   Social Determinants of Health   Financial Resource Strain: Not on file  Food Insecurity: Not on file  Transportation Needs: Not on file  Physical Activity: Not on file  Stress: Not on file  Social Connections: Not on file     Allergies:   Allergies  Allergen Reactions   Niacin Hives    Metabolic Disorder Labs: Lab Results  Component Value Date   HGBA1C 6.0 (H) 07/20/2022   MPG 125.5 07/20/2022   No results found for: "PROLACTIN" Lab Results  Component Value Date   CHOL 168 01/14/2020   TRIG 78 01/14/2020   HDL 61 01/14/2020   CHOLHDL 3.1 11/13/2019   LDLCALC 92 01/14/2020   LDLCALC 101 (H) 11/13/2019   Lab Results  Component Value Date   TSH 3.610 01/14/2020    Therapeutic Level Labs: No results found for: "LITHIUM" No results found for: "CBMZ" No results found for: "VALPROATE"  Current Medications: See chart    Psychiatric Specialty  Exam: Review of Systems  Cardiovascular:  Negative for chest pain.  Skin:  Negative for rash.  Psychiatric/Behavioral:  Negative for hallucinations.     Last menstrual period 09/24/2013.There is no height or weight on file to calculate BMI.  General Appearance: Casual  Eye Contact:  Fair  Speech:  Normal Rate  Volume:  Normal  Mood: fair  Affect:  Congruent  Thought Process:  Goal Directed  Orientation:  Full (Time, Place, and Person)  Thought Content:  Rumination  Suicidal Thoughts:  No  Homicidal Thoughts:  No  Memory:  Immediate;   Fair Recent;   Fair  Judgement:  Fair  Insight:  Shallow  Psychomotor Activity:  decreased  Concentration:  Concentration: Fair and Attention Span: Fair  Recall:  [employer] of Knowledge:Fair  Language: Good  Akathisia:  No  Handed:   AIMS (if indicated):  not done  Assets:  Desire for Improvement Housing Social Support  ADL's:  Intact  Cognition: WNL  Sleep:   variable   Screenings: Insurance account manager from 04/05/2022 in [facility] Office Visit from 01/14/2020 in [facility] Total Score 2 6  PHQ-9 Total Score 8 14      Flowsheet Row Video Visit from 04/05/2023 in [facility] at [facility] ED from 08/06/2022 in [facility] Emergency Department at [facility] Admission (Discharged) from 08/02/2022 in [place] LONG PERIOPERATIVE AREA  C-SSRS RISK CATEGORY No Risk No Risk No Risk       Assessment and Plan: as follows   Prior documentation reviewed   Bipolar 2 ; depressed ; stable on lamictal, no rash will continue Depression; improved, continue lamictal,   Insomnia:;manageable with Remus Loffler, reviewed sleep hygiene  Fu 57m. Renewed lamictal    Thresa Ross, MD 10/23/20241:03 PM

## 2023-04-06 DIAGNOSIS — S86011D Strain of right Achilles tendon, subsequent encounter: Secondary | ICD-10-CM | POA: Diagnosis not present

## 2023-04-07 ENCOUNTER — Other Ambulatory Visit (HOSPITAL_COMMUNITY): Payer: Self-pay

## 2023-04-07 ENCOUNTER — Other Ambulatory Visit: Payer: Self-pay

## 2023-04-10 ENCOUNTER — Other Ambulatory Visit: Payer: Self-pay

## 2023-04-10 ENCOUNTER — Other Ambulatory Visit (HOSPITAL_COMMUNITY): Payer: Self-pay

## 2023-04-10 DIAGNOSIS — Z1231 Encounter for screening mammogram for malignant neoplasm of breast: Secondary | ICD-10-CM | POA: Diagnosis not present

## 2023-04-10 DIAGNOSIS — Z13820 Encounter for screening for osteoporosis: Secondary | ICD-10-CM | POA: Diagnosis not present

## 2023-04-10 DIAGNOSIS — Z1331 Encounter for screening for depression: Secondary | ICD-10-CM | POA: Diagnosis not present

## 2023-04-10 DIAGNOSIS — Z139 Encounter for screening, unspecified: Secondary | ICD-10-CM | POA: Diagnosis not present

## 2023-04-10 DIAGNOSIS — Z124 Encounter for screening for malignant neoplasm of cervix: Secondary | ICD-10-CM | POA: Diagnosis not present

## 2023-04-10 DIAGNOSIS — Z6841 Body Mass Index (BMI) 40.0 and over, adult: Secondary | ICD-10-CM | POA: Diagnosis not present

## 2023-04-10 DIAGNOSIS — Z1211 Encounter for screening for malignant neoplasm of colon: Secondary | ICD-10-CM | POA: Diagnosis not present

## 2023-04-10 DIAGNOSIS — Z01419 Encounter for gynecological examination (general) (routine) without abnormal findings: Secondary | ICD-10-CM | POA: Diagnosis not present

## 2023-04-11 DIAGNOSIS — S86011D Strain of right Achilles tendon, subsequent encounter: Secondary | ICD-10-CM | POA: Diagnosis not present

## 2023-04-15 ENCOUNTER — Other Ambulatory Visit (HOSPITAL_COMMUNITY): Payer: Self-pay

## 2023-04-17 ENCOUNTER — Ambulatory Visit (INDEPENDENT_AMBULATORY_CARE_PROVIDER_SITE_OTHER): Payer: HMO | Admitting: Licensed Clinical Social Worker

## 2023-04-17 DIAGNOSIS — F3181 Bipolar II disorder: Secondary | ICD-10-CM | POA: Diagnosis not present

## 2023-04-17 DIAGNOSIS — F5102 Adjustment insomnia: Secondary | ICD-10-CM | POA: Diagnosis not present

## 2023-04-17 DIAGNOSIS — F331 Major depressive disorder, recurrent, moderate: Secondary | ICD-10-CM

## 2023-04-17 NOTE — Progress Notes (Signed)
Virtual Visit via Video Note  I connected with Mary Mcfarland on 04/17/23 at 10:00 AM EST by a video enabled telemedicine application and verified that I am speaking with the correct person using two identifiers.  Location: Patient: home Provider: home office   I discussed the limitations of evaluation and management by telemedicine and the availability of in person appointments. The patient expressed understanding and agreed to proceed.   I discussed the assessment and treatment plan with the patient. The patient was provided an opportunity to ask questions and all were answered. The patient agreed with the plan and demonstrated an understanding of the instructions.   The patient was advised to call back or seek an in-person evaluation if the symptoms worsen or if the condition fails to improve as anticipated.  I provided 45 minutes of non-face-to-face time during this encounter.   THERAPIST PROGRESS NOTE  Session Time: 10:00 Am to 10:45 AM  Participation Level: Active  Behavioral Response: CasualAlertissues related to relationship euthymic in session  Type of Therapy: Individual Therapy  Treatment Goals addressed: learning to say no in relationships, addressing depressive issues, work on being more active and motivated, coping  ProgressTowards Goals: Progressing-processing thoughts and feelings in session to help with mood symptoms, patient taking positive steps as well for self-care moving forward with goals also helpful for mood  Interventions: Solution Focused, Strength-based, Supportive, and Other: strenth based.   Summary: Mary Mcfarland is a 67 y.o. female who presents with foot hurts been back to physical therapy they are nice.  Reviewed procedure bone spur that grow digging into Achilles heel repercussions can be ongoing. Has a situation seeing a guy 7 years. The problem is that feelings are getting there, intense. He is married but his marriage is not good.  Daughter is 46 he doesn't want to do anything until 62. She is 16 patient said best cheerleader and would say why didn't do before. Trying to maneuver it best can. He turned 52 a light switch went off why putting himself through this, live life and be happy. His marriage is not working. He will always love her mother of child but not in love with her always a place in his heart. Mary Mcfarland gives her a hard time and patient says it is her life. Morally not right. Come to conclusion people not supposed to be with the same person all life. Marriage if good then works if not good why stay miserable in a situation. Nothing in common she doesn't want to do do anything. Relationship where lonely where he is, he is lonely. People look for what is lacking in relationship. Both husbands cheating on her. First one surprised. Doesn't believe staying together for the children.  Talked about the negative of the phones. Good for family that they put phones in basket when get together with aunt's.  Mary Mcfarland on the phone all the time kids run amok therapist pointed out he needs skills in parenting. Kids going 1000 when together patient tells them to separate and give them quiet time. Mary Mcfarland has to break everything can't have crayons. Has taken marker down the hallway.  Start pottery class. Going to [employer] [facility]. Doing the energy cards. Doing things enjoys, therapist enthusiastic she is moving forward with goals. Wants aunt to get things done medically she is getting passport means they could travel together. They are close. Therapist noted she is helping a lot she lost husband and having family there helps her a lot. She is getting  to know people in neighborhood, a book club, plays cards. Son and daughter-in-law living with them.  Talked about Mary Mcfarland buying her cards, buying puzzles. Good activities that are really good for her very creative she is like patient's daughter. She made a picture that was amazing. She was Insurance claims handler.      Talked about patient's issues seeing a married man in love.  Therapist was validating empathetic helping her to process thoughts and feelings help with coping.  Therapist had a friend similar experience still has some knowledge can be difficult when do not always come first.  Explored why not leaving?  Patient noted some good insights therapist agrees with not always meant to be married to the same person, people will look outside marriage if not happy in relationship.  Assess helpful for patient to have an outlet to talk could not be judged as she is trying to maneuver and cope with the situation.  We were able to talk about some positive therapist impressed patient taking some proactive steps for her wellbeing for her goals including plans on taking a pottery class, positive interactions with her granddaughter encouraging her in many creative ways.  Talked about issues in the family including the phone son-in-law always on the phone in some ways can help it he is ADHD but therapist said porn in terms of the parenting skills to be focused to work on parenting as well as being available socially as a Artist assigned of respect to others.  Therapist provided questioning just this current and underlying problems looked at responses that would be helpful for patient Suicidal/Homicidal: No  Plan: Return again in 4 weeks.2.  Cyst thoughts and feelings in session to help with coping  Diagnosis:  Bipolar 2, major depressive disorder recurrent moderate, adjustment insomnia  Collaboration of Care: Other none needed  Patient/Guardian was advised Release of Information must be obtained prior to any record release in order to collaborate their care with an outside provider. Patient/Guardian was advised if they have not already done so to contact the registration department to sign all necessary forms in order for Mary Mcfarland to release information regarding their care.   Consent: Patient/Guardian gives verbal  consent for treatment and assignment of benefits for services provided during this visit. Patient/Guardian expressed understanding and agreed to proceed.   Coolidge Breeze, LCSW 04/17/2023

## 2023-04-18 DIAGNOSIS — S86011D Strain of right Achilles tendon, subsequent encounter: Secondary | ICD-10-CM | POA: Diagnosis not present

## 2023-04-20 DIAGNOSIS — S86011D Strain of right Achilles tendon, subsequent encounter: Secondary | ICD-10-CM | POA: Diagnosis not present

## 2023-04-25 DIAGNOSIS — S86011D Strain of right Achilles tendon, subsequent encounter: Secondary | ICD-10-CM | POA: Diagnosis not present

## 2023-04-27 ENCOUNTER — Other Ambulatory Visit (HOSPITAL_COMMUNITY): Payer: Self-pay

## 2023-04-27 ENCOUNTER — Other Ambulatory Visit: Payer: Self-pay

## 2023-04-27 DIAGNOSIS — S86011D Strain of right Achilles tendon, subsequent encounter: Secondary | ICD-10-CM | POA: Diagnosis not present

## 2023-05-01 ENCOUNTER — Encounter (HOSPITAL_COMMUNITY): Payer: Self-pay | Admitting: Licensed Clinical Social Worker

## 2023-05-02 DIAGNOSIS — S86011D Strain of right Achilles tendon, subsequent encounter: Secondary | ICD-10-CM | POA: Diagnosis not present

## 2023-05-03 DIAGNOSIS — M79671 Pain in right foot: Secondary | ICD-10-CM | POA: Diagnosis not present

## 2023-05-05 ENCOUNTER — Encounter (HOSPITAL_COMMUNITY): Payer: Self-pay | Admitting: Physician Assistant

## 2023-05-05 ENCOUNTER — Ambulatory Visit (HOSPITAL_COMMUNITY)
Admission: RE | Admit: 2023-05-05 | Discharge: 2023-05-05 | Disposition: A | Discharge status: Home / Self Care | Payer: HMO | Source: Ambulatory Visit | Attending: Physician Assistant | Admitting: Physician Assistant

## 2023-05-05 ENCOUNTER — Other Ambulatory Visit (HOSPITAL_COMMUNITY): Payer: Self-pay

## 2023-05-05 VITALS — BP 114/84 | HR 106 | Ht 66.0 in | Wt 262.0 lb

## 2023-05-05 DIAGNOSIS — Z950 Presence of cardiac pacemaker: Secondary | ICD-10-CM | POA: Insufficient documentation

## 2023-05-05 DIAGNOSIS — Z79899 Other long term (current) drug therapy: Secondary | ICD-10-CM | POA: Diagnosis not present

## 2023-05-05 DIAGNOSIS — R001 Bradycardia, unspecified: Secondary | ICD-10-CM | POA: Insufficient documentation

## 2023-05-05 DIAGNOSIS — I484 Atypical atrial flutter: Secondary | ICD-10-CM | POA: Insufficient documentation

## 2023-05-05 DIAGNOSIS — D6869 Other thrombophilia: Secondary | ICD-10-CM

## 2023-05-05 DIAGNOSIS — Z5181 Encounter for therapeutic drug level monitoring: Secondary | ICD-10-CM | POA: Diagnosis not present

## 2023-05-05 DIAGNOSIS — S86011D Strain of right Achilles tendon, subsequent encounter: Secondary | ICD-10-CM | POA: Diagnosis not present

## 2023-05-05 DIAGNOSIS — E669 Obesity, unspecified: Secondary | ICD-10-CM | POA: Diagnosis not present

## 2023-05-05 DIAGNOSIS — I4811 Longstanding persistent atrial fibrillation: Secondary | ICD-10-CM | POA: Diagnosis not present

## 2023-05-05 DIAGNOSIS — Z7901 Long term (current) use of anticoagulants: Secondary | ICD-10-CM | POA: Diagnosis not present

## 2023-05-05 DIAGNOSIS — I10 Essential (primary) hypertension: Secondary | ICD-10-CM | POA: Insufficient documentation

## 2023-05-05 DIAGNOSIS — G4733 Obstructive sleep apnea (adult) (pediatric): Secondary | ICD-10-CM | POA: Diagnosis not present

## 2023-05-05 DIAGNOSIS — Z6841 Body Mass Index (BMI) 40.0 and over, adult: Secondary | ICD-10-CM | POA: Insufficient documentation

## 2023-05-05 DIAGNOSIS — I4819 Other persistent atrial fibrillation: Secondary | ICD-10-CM | POA: Diagnosis not present

## 2023-05-05 LAB — CBC
HCT: 42.6 % (ref 36.0–46.0)
Hemoglobin: 13.6 g/dL (ref 12.0–15.0)
MCH: 28.3 pg (ref 26.0–34.0)
MCHC: 31.9 g/dL (ref 30.0–36.0)
MCV: 88.8 fL (ref 80.0–100.0)
Platelets: 351 10*3/uL (ref 150–400)
RBC: 4.8 MIL/uL (ref 3.87–5.11)
RDW: 14.3 % (ref 11.5–15.5)
WBC: 11.5 10*3/uL — ABNORMAL HIGH (ref 4.0–10.5)
nRBC: 0 % (ref 0.0–0.2)

## 2023-05-05 LAB — BASIC METABOLIC PANEL
Anion gap: 11 (ref 5–15)
BUN: 17 mg/dL (ref 8–23)
CO2: 23 mmol/L (ref 22–32)
Calcium: 9.3 mg/dL (ref 8.9–10.3)
Chloride: 105 mmol/L (ref 98–111)
Creatinine, Ser: 0.85 mg/dL (ref 0.44–1.00)
GFR, Estimated: >60 mL/min (ref >60)
Glucose, Bld: 104 mg/dL — ABNORMAL HIGH (ref 70–99)
Potassium: 3.8 mmol/L (ref 3.5–5.1)
Sodium: 139 mmol/L (ref 135–145)

## 2023-05-05 LAB — MAGNESIUM: Magnesium: 6.3 mg/dL (ref 1.7–2.4)

## 2023-05-05 NOTE — Patient Instructions (Signed)
Cardioversion scheduled for: Wednesday, December 11th   - Arrive at the [facility] and go to admitting at 930am   - Do not eat or drink anything after midnight the night prior to your procedure.   - Take all your morning medication (except diabetic medications) with a sip of water prior to arrival.  - You will not be able to drive home after your procedure.    - Do NOT miss any doses of your blood thinner - if you should miss a dose please notify our office immediately.   - If you feel as if you go back into normal rhythm prior to scheduled cardioversion, please notify our office immediately.   If your procedure is canceled in the cardioversion suite you will be charged a cancellation fee.   Will have appt with Renee rescheduled to see Dr. Lalla Brothers

## 2023-05-05 NOTE — Progress Notes (Signed)
Primary Care Physician: Daisy Floro, MD Primary Cardiologist: Dr Cristal Deer  Primary Electrophysiologist: Dr Lalla Brothers Referring Physician: Dr Chyrel Masson is a 67 y.o. female with a history of HTN, bipolar disorder, symptomatic bradycardia s/p PPM, and persistent atrial fibrillation who presents for follow up in the [facility]. She was first diagnosed with atrial fibrillation in 2017 and then recurrence found in January of this year at PCP visit. She went in for evaluation of headaches, dizziness.  She was placed on BB and Eliquis.  She underwent attempted cardioversion in February which failed to restore SR. Patient is on Eliquis for a CHADS2VASC score of 3. She was evaluated by Dr Johney Frame on 08/09/19 who recommended dofetilide. Patient is s/p dofetilide loading 3/16-3/19/21. She did not require DCCV. Patient was seen at her PCP office on 11/21/19 and found to be bradycardic HR 44 with PACs. She was also noted to have a 3 second pause on rhythm strip. Her BB was stopped. Patient reports that her SOB has improved off of metoprolol. Patient presented for DCCV on 12/06/19 and was found to be in SR, procedure was cancelled.   On follow up today, patient reports that she went back into afib about one week ago and has been persistent since. She has had a URI also. She did miss her evening medications on 11/18.  Today, she denies symptoms of palpitations, chest pain, orthopnea, PND, presyncope, syncope, bleeding, or neurologic sequela. The patient is tolerating medications without difficulties and is otherwise without complaint today.    Atrial Fibrillation Risk Factors:  she does have symptoms or diagnosis of sleep apnea.  she does not have a history of rheumatic fever. she does not have a history of alcohol use.   Atrial Fibrillation Management history:  Previous antiarrhythmic drugs: dofetilide Previous cardioversions: 07/24/19 Previous  ablations: none Anticoagulation history: Eliquis   Past Medical History:  Diagnosis Date   Anticoagulant long-term use    eliquis--- managed by cardiology   Asthma, mild persistent    pulmonologist--- dr Irma Newness;  (04-08-2022  pt stated last rescue inhaler use 2 months ago)   Bipolar 2 disorder (HCC)    Cardiac pacemaker in situ 02/06/2020   for tachy-brady syndrome and symptomatic bradycardia;   [employer]  (ep cardiologist--- dr Johney Frame)   Cystocele with rectocele    Difficult intubation 03/2016   04-08-2022  pt stated was scheduled for achilles tendon repair @ [facility] prior to when was done @ [facility] on 10-03-207,  when she woke up was told by nurse the surgery was note done due to Narrow airway and did not have what was needed so surgery was rescheduled (no documentation of any type in epic about this incident) per anesthesia record from 03-15-2016 pt had no issues and pt stated no issues   Diverticulosis of colon    DOE (dyspnea on exertion)    pt stated due to aftrial fib, sob,  is able to do household chores but with long distance walking and stairs get sob   Edema of both lower extremities    GERD (gastroesophageal reflux disease)    History of adenomatous polyp of colon    History of diverticulitis of colon    Hyperlipidemia    Hypertension    Longstanding persistent atrial fibrillation [facility]) 2017   cardiologist-- dr c. Hughie Closs;  for dx 2017 the reacurrance 01/ 2021;  s/p DCCV 07-24-2019;  admitted for tikosyn loading 03/ 2021;  sss s/p dual PPM 02-06-2020   MDD (major depressive disorder)    OA (osteoarthritis)    OSA (obstructive sleep apnea)    sleep study in epic 04-06-2021 OSA and nocturnal hypoxemia, per pt was unable to tolerate bipap recommended but does have appointment to get dental appliance   Pelvic prolapse    PONV (postoperative nausea and vomiting)    Pre-diabetes    Sciatica of right side    SSS (sick sinus syndrome) (HCC)    Thyroid nodule     followed by pcp;  last ultrasound in epic 12-09-2011 pt stated never had bx done   Vitamin D deficiency    Wears glasses     ROS- All systems are reviewed and negative except as per the HPI above.  Physical Exam: Vitals:   05/05/23 1102  BP: 114/84  Pulse: (!) 106  Weight: 118.8 kg  Height: 5\' 6"  (1.676 m)     GEN: Well nourished, well developed in no acute distress NECK: No JVD; No carotid bruits CARDIAC: Irregularly irregular rate and rhythm, no murmurs, rubs, gallops RESPIRATORY:  Clear to auscultation without rales, wheezing or rhonchi  ABDOMEN: Soft, non-tender, non-distended EXTREMITIES:  No edema; No deformity    Wt Readings from Last 3 Encounters:  05/05/23 118.8 kg  03/15/23 120 kg  02/17/23 121.3 kg    EKG today demonstrates  Afib, RBBB Vent. rate 106 BPM PR interval * ms QRS duration 144 ms QT/QTcB 386/512 ms   Echo 07/01/19 demonstrated  1. Left ventricular ejection fraction, by visual estimation, is 60 to  65%. The left ventricle has normal function. There is mildly increased  left ventricular hypertrophy.   2. Left ventricular diastolic parameters are indeterminate.   3. The left ventricle has no regional wall motion abnormalities.   4. Global right ventricle has normal systolic function.The right  ventricular size is normal. No increase in right ventricular wall  thickness.   5. Left atrial size was moderately dilated.   6. Right atrial size was normal.   7. The mitral valve is normal in structure. Trivial mitral valve  regurgitation. No evidence of mitral stenosis.   8. The tricuspid valve is normal in structure.   9. The tricuspid valve is normal in structure. Tricuspid valve  regurgitation is not demonstrated.  10. The aortic valve is tricuspid. Aortic valve regurgitation is not  visualized. Mild to moderate aortic valve sclerosis/calcification without  any evidence of aortic stenosis.  11. Calcified non coronary cusp.  12. The pulmonic  valve was normal in structure. Pulmonic valve  regurgitation is not visualized.  13. The inferior vena cava is normal in size with greater than 50%  respiratory variability, suggesting right atrial pressure of 3 mmHg.   Epic records are reviewed at length today  CHA2DS2-VASc Score = 3  The patient's score is based upon: CHF History: 0 HTN History: 1 Diabetes History: 0 Stroke History: 0 Vascular Disease History: 0 Age Score: 1 Gender Score: 1       ASSESSMENT AND PLAN: Persistent Atrial Fibrillation/atypical atrial flutter The patient's CHA2DS2-VASc score is 3, indicating a 3.2% annual risk of stroke.   Patient back in persistent afib for > 1 week. We discussed rhythm control options today. Will arrange for DCCV after 3 weeks of uninterrupted anticoagulation. Will also refer her to discuss ablation with Dr Lalla Brothers. Would like to avoid amiodarone given her young age. We did discuss that the ablation is more likely to be successful with a BMI <  40.  Continue diltiazem 120 mg BID Continue dofetilide 250 mcg BID Continue Eliquis 5 mg BID Check bmet/mag/cbc today  Secondary Hypercoagulable State (ICD10:  D68.69) The patient is at significant risk for stroke/thromboembolism based upon her CHA2DS2-VASc Score of 3.  Continue Apixaban (Eliquis).   Obesity Body mass index is 42.29 kg/m.  Encouraged lifestyle modification  OSA  Patient uses an oral appliance She did not tolerate BiPap  HTN Stable on current regimen  Symptomatic bradycardia S/p PPM, followed by Dr Lalla Brothers and the device clinic   Follow up with Dr Lalla Brothers to discuss possible afib ablation.    Jorja Loa PA-C Afib Clinic [facility] [address] [place], [place] [address] [phone] 05/05/2023 11:14 AM

## 2023-05-08 ENCOUNTER — Other Ambulatory Visit (HOSPITAL_COMMUNITY): Payer: Self-pay

## 2023-05-08 ENCOUNTER — Ambulatory Visit (HOSPITAL_COMMUNITY)
Admission: RE | Admit: 2023-05-08 | Discharge: 2023-05-08 | Disposition: A | Discharge status: Home / Self Care | Payer: HMO | Source: Ambulatory Visit | Attending: Physician Assistant | Admitting: Physician Assistant

## 2023-05-08 DIAGNOSIS — I480 Paroxysmal atrial fibrillation: Secondary | ICD-10-CM | POA: Insufficient documentation

## 2023-05-08 LAB — MAGNESIUM: Magnesium: 2.2 mg/dL (ref 1.7–2.4)

## 2023-05-09 ENCOUNTER — Other Ambulatory Visit: Payer: Self-pay

## 2023-05-10 ENCOUNTER — Ambulatory Visit (INDEPENDENT_AMBULATORY_CARE_PROVIDER_SITE_OTHER): Payer: HMO

## 2023-05-10 DIAGNOSIS — I495 Sick sinus syndrome: Secondary | ICD-10-CM

## 2023-05-10 LAB — CUP PACEART REMOTE DEVICE CHECK
Battery Remaining Longevity: 59 mo
Battery Remaining Percentage: 68 %
Battery Voltage: 3.01 V
Brady Statistic AP VP Percent: 36 %
Brady Statistic AP VS Percent: 15 %
Brady Statistic AS VP Percent: 15 %
Brady Statistic AS VS Percent: 27 %
Brady Statistic RA Percent Paced: 40 %
Brady Statistic RV Percent Paced: 46 %
Date Time Interrogation Session: 20241127032040
Implantable Lead Connection Status: 753985
Implantable Lead Connection Status: 753985
Implantable Lead Implant Date: 20210826
Implantable Lead Implant Date: 20210826
Implantable Lead Location: 753859
Implantable Lead Location: 753860
Implantable Pulse Generator Implant Date: 20210826
Lead Channel Impedance Value: 450 Ohm
Lead Channel Impedance Value: 490 Ohm
Lead Channel Pacing Threshold Amplitude: 1.25 V
Lead Channel Pacing Threshold Amplitude: 1.25 V
Lead Channel Pacing Threshold Pulse Width: 0.4 ms
Lead Channel Pacing Threshold Pulse Width: 1 ms
Lead Channel Sensing Intrinsic Amplitude: 2.9 mV
Lead Channel Sensing Intrinsic Amplitude: 3.9 mV
Lead Channel Setting Pacing Amplitude: 2.5 V
Lead Channel Setting Pacing Amplitude: 2.5 V
Lead Channel Setting Pacing Pulse Width: 0.4 ms
Lead Channel Setting Sensing Sensitivity: 2 mV
Pulse Gen Model: 2272
Pulse Gen Serial Number: 3859348

## 2023-05-15 ENCOUNTER — Telehealth (HOSPITAL_COMMUNITY): Payer: Self-pay | Admitting: *Deleted

## 2023-05-15 ENCOUNTER — Ambulatory Visit (HOSPITAL_COMMUNITY): Payer: HMO | Admitting: Licensed Clinical Social Worker

## 2023-05-15 NOTE — Telephone Encounter (Signed)
Patient converted back to NSR on 11/26. Cardioversion scheduled canceled. Pt to keep scheduled follow up with Dr Lalla Brothers.

## 2023-05-16 DIAGNOSIS — S86011D Strain of right Achilles tendon, subsequent encounter: Secondary | ICD-10-CM | POA: Diagnosis not present

## 2023-05-17 ENCOUNTER — Other Ambulatory Visit: Payer: Self-pay

## 2023-05-17 ENCOUNTER — Other Ambulatory Visit (HOSPITAL_COMMUNITY): Payer: Self-pay

## 2023-05-22 ENCOUNTER — Other Ambulatory Visit: Payer: Self-pay

## 2023-05-23 DIAGNOSIS — S86011D Strain of right Achilles tendon, subsequent encounter: Secondary | ICD-10-CM | POA: Diagnosis not present

## 2023-05-24 ENCOUNTER — Encounter (HOSPITAL_COMMUNITY): Payer: Self-pay

## 2023-05-24 ENCOUNTER — Ambulatory Visit (HOSPITAL_COMMUNITY): Admit: 2023-05-24 | Payer: HMO | Admitting: Cardiology

## 2023-05-24 SURGERY — CARDIOVERSION (CATH LAB)
Anesthesia: General

## 2023-05-25 ENCOUNTER — Other Ambulatory Visit (HOSPITAL_COMMUNITY): Payer: Self-pay

## 2023-05-29 ENCOUNTER — Other Ambulatory Visit (HOSPITAL_BASED_OUTPATIENT_CLINIC_OR_DEPARTMENT_OTHER): Payer: Self-pay

## 2023-05-29 ENCOUNTER — Other Ambulatory Visit (HOSPITAL_COMMUNITY): Payer: Self-pay

## 2023-05-29 MED ORDER — ZOLPIDEM TARTRATE 10 MG PO TABS
5.0000 mg | ORAL_TABLET | Freq: Every evening | ORAL | 2 refills | Status: AC | PRN
  Filled 2023-05-29 (×2): qty 30, 30d supply, fill #0
  Filled 2023-06-25: qty 30, 30d supply, fill #1
  Filled 2023-07-27: qty 30, 30d supply, fill #2

## 2023-05-30 ENCOUNTER — Encounter: Payer: HMO | Admitting: Physician Assistant

## 2023-06-08 ENCOUNTER — Other Ambulatory Visit (HOSPITAL_COMMUNITY): Payer: Self-pay

## 2023-06-08 ENCOUNTER — Other Ambulatory Visit: Payer: Self-pay

## 2023-06-08 ENCOUNTER — Other Ambulatory Visit: Payer: Self-pay | Admitting: Cardiology

## 2023-06-08 DIAGNOSIS — I480 Paroxysmal atrial fibrillation: Secondary | ICD-10-CM

## 2023-06-08 MED ORDER — APIXABAN 5 MG PO TABS
5.0000 mg | ORAL_TABLET | Freq: Two times a day (BID) | ORAL | 1 refills | Status: DC
  Filled 2023-06-08: qty 180, 90d supply, fill #0

## 2023-06-08 NOTE — Telephone Encounter (Signed)
Prescription refill request for Eliquis received. Indication: Afib  Last office visit: 05/05/23 Charlean Merl)  Scr: 0.85 (05/05/23)  Age: 67 Weight: 118.8kg  Appropriate dose. Refill sent.

## 2023-06-13 ENCOUNTER — Ambulatory Visit: Payer: HMO | Attending: Cardiology | Admitting: Cardiology

## 2023-06-13 ENCOUNTER — Encounter: Payer: Self-pay | Admitting: Cardiology

## 2023-06-13 VITALS — BP 110/58 | HR 71 | Ht 66.0 in | Wt 262.2 lb

## 2023-06-13 DIAGNOSIS — Z950 Presence of cardiac pacemaker: Secondary | ICD-10-CM

## 2023-06-13 DIAGNOSIS — Z79899 Other long term (current) drug therapy: Secondary | ICD-10-CM | POA: Diagnosis not present

## 2023-06-13 DIAGNOSIS — I4819 Other persistent atrial fibrillation: Secondary | ICD-10-CM

## 2023-06-13 LAB — CUP PACEART INCLINIC DEVICE CHECK
Battery Remaining Longevity: 67 mo
Battery Voltage: 2.99 V
Brady Statistic RA Percent Paced: 47 %
Brady Statistic RV Percent Paced: 49 %
Date Time Interrogation Session: 20241231170838
Implantable Lead Connection Status: 753985
Implantable Lead Connection Status: 753985
Implantable Lead Implant Date: 20210826
Implantable Lead Implant Date: 20210826
Implantable Lead Location: 753859
Implantable Lead Location: 753860
Implantable Pulse Generator Implant Date: 20210826
Lead Channel Impedance Value: 450 Ohm
Lead Channel Impedance Value: 525 Ohm
Lead Channel Pacing Threshold Amplitude: 1 V
Lead Channel Pacing Threshold Amplitude: 1 V
Lead Channel Pacing Threshold Amplitude: 1.25 V
Lead Channel Pacing Threshold Amplitude: 1.25 V
Lead Channel Pacing Threshold Pulse Width: 0.4 ms
Lead Channel Pacing Threshold Pulse Width: 0.4 ms
Lead Channel Pacing Threshold Pulse Width: 0.5 ms
Lead Channel Pacing Threshold Pulse Width: 0.5 ms
Lead Channel Sensing Intrinsic Amplitude: 3 mV
Lead Channel Sensing Intrinsic Amplitude: 4.6 mV
Lead Channel Setting Pacing Amplitude: 2.5 V
Lead Channel Setting Pacing Amplitude: 2.5 V
Lead Channel Setting Pacing Pulse Width: 0.4 ms
Lead Channel Setting Sensing Sensitivity: 2 mV
Pulse Gen Model: 2272
Pulse Gen Serial Number: 3859348

## 2023-06-13 NOTE — Progress Notes (Signed)
 Electrophysiology Office Follow up Visit Note:    Date:  06/13/2023   ID:  Mary Mcfarland, DOB 14-Sep-1955, MRN [id number]  PCP:  Okey Carlin Redbird, MD  [facility] Cardiologist:  Shelda Bruckner, MD  [facility] Electrophysiologist:  OLE ONEIDA HOLTS, MD    Interval History:     Mary Mcfarland is a 67 y.o. female who presents for a follow up visit.   I last saw the patient October 01, 2021.  At that time she was on Tikosyn  and Eliquis  for her persistent atrial fibrillation.  She has a permanent pacemaker in situ for sick sinus syndrome that was working appropriately at the last appointment with me.  She contacted the office several months ago reporting atrial fibrillation with rapid ventricular rates.  She was dizzy with the episode and felt the palpitations.  She saw all Daril in the A-fib clinic May 05, 2023.  At that appointment she had been in atrial fibrillation for at least 1 week.  That was after she missed her evening medications several days earlier.  Given her young age and persistence of atrial fibrillation despite treatment with Tikosyn , she is referred back to discuss catheter ablation.      Past medical, surgical, social and family history were reviewed.  ROS:   Please see the history of present illness.    All other systems reviewed and are negative.  EKGs/Labs/Other Studies Reviewed:    The following studies were reviewed today:  June 13, 2023 in clinic device interrogation personally reviewed AF burden 13% Lead parameter stable Battery longevity okay No changes made today with programming   EKG Interpretation Date/Time:  Tuesday June 13 2023 14:17:52 EST Ventricular Rate:  71 PR Interval:  242 QRS Duration:  88 QT Interval:  436 QTC Calculation: 473 R Axis:   -32  Text Interpretation: Atrial-sensed ventricular-paced rhythm with prolonged AV conduction with frequent AV dual-paced complexes Confirmed by Holts Ole [phone]) on 06/13/2023 2:23:01 PM    Physical Exam:    VS:  BP (!) 110/58 (BP Location: Left Arm, Patient Position: Sitting, Cuff Size: Large)   Pulse 71   Ht 5' 6 (1.676 m)   Wt 262 lb 3.2 oz (118.9 kg)   LMP 09/24/2013   SpO2 95%   BMI 42.32 kg/m     Wt Readings from Last 3 Encounters:  06/13/23 262 lb 3.2 oz (118.9 kg)  05/05/23 262 lb (118.8 kg)  03/15/23 264 lb 9.6 oz (120 kg)     GEN: no distress CARD: RRR, No MRG.  Pacemaker pocket well-healed RESP: No IWOB. CTAB.      ASSESSMENT:    1. Persistent atrial fibrillation (HCC)   2. Cardiac pacemaker in situ   3. Encounter for long-term (current) use of high-risk medication    PLAN:    In order of problems listed above:  #Persistent atrial fibrillation #High risk med monitoring-Tikosyn  The patient continues to have episodes of symptomatic atrial fibrillation despite treatment with Tikosyn .  We have discussed treatment options including alternative antiarrhythmic drugs and catheter ablation.  She would like to proceed with catheter ablation which I think is reasonable.  Given her young age, I would like to avoid exposure to amiodarone.  I have discussed the catheter ablation procedure in detail the patient including the risks and likelihood of success and she wishes to proceed.  Recent creatinine and EKG today acceptable for ongoing Tikosyn  use.  Discussed treatment options today for AF including antiarrhythmic drug therapy and  ablation. Discussed risks, recovery and likelihood of success with each treatment strategy. Risk, benefits, and alternatives to EP study and ablation for afib were discussed. These risks include but are not limited to stroke, bleeding, vascular damage, tamponade, perforation, damage to the esophagus, lungs, phrenic nerve and other structures, pulmonary vein stenosis, worsening renal function, coronary vasospasm and death.  Discussed potential need for repeat ablation procedures and  antiarrhythmic drugs after an initial ablation. The patient understands these risk and wishes to proceed.  We will therefore proceed with catheter ablation at the next available time.  Carto, ICE, anesthesia are requested for the procedure.  Will also obtain CT PV protocol prior to the procedure to exclude LAA thrombus and further evaluate atrial anatomy.  #Permanent pacemaker in situ #Sick sinus syndrome Device functioning appropriately.  Continue remote monitoring.    Signed, Ole Holts, MD, [facility], [facility] 06/13/2023 2:23 PM    Electrophysiology [facility]

## 2023-06-13 NOTE — Patient Instructions (Addendum)
 Medication Instructions:  Your physician recommends that you continue on your current medications as directed. Please refer to the Current Medication list given to you today.  *If you need a refill on your cardiac medications before your next appointment, please call your pharmacy*   Lab Work: BMET and CBC at [facility] prior to your ablation - anytime after January 22nd  Testing/Procedures: Cardiac CT  - we will call you to schedule your CT scan. It will be done about three weeks prior to your ablation. Your physician has requested that you have cardiac CT. Cardiac computed tomography (CT) is a painless test that uses an x-ray machine to take clear, detailed pictures of your heart. For further information please visit [email or web address]  Ablation Your physician has recommended that you have an ablation. Catheter ablation is a medical procedure used to treat some cardiac arrhythmias (irregular heartbeats). During catheter ablation, a long, thin, flexible tube is put into a blood vessel in your groin (upper thigh), or neck. This tube is called an ablation catheter. It is then guided to your heart through the blood vessel. Radio frequency waves destroy small areas of heart tissue where abnormal heartbeats may cause an arrhythmia to start. You are scheduled for Atrial Fibrillation Ablation on Thursday, February 20 with Dr. Ole Holts.Please arrive at the Main Entrance A at [facility]: [address] [place], [place] [address] at 5:30 AM   Follow-Up: At [facility] [place], you and your health needs are our priority.  As part of our continuing mission to provide you with exceptional heart care, we have created designated Provider Care Teams.  These Care Teams include your primary Cardiologist (physician) and Advanced Practice Providers (APPs -  Physician Assistants and Nurse Practitioners) who all work together to provide you with the care you need, when you need it.  Your next  appointment:   We will call you to arrange your follow up appointments

## 2023-06-18 IMAGING — DX DG CHEST 1V
1 series · 1 of 1 positions shown · non-contrast
Comparison: 02/07/2020

CLINICAL DATA: Nonradiating chest pain since 3 p.m., history of
atrial fibrillation

EXAM:
CHEST  1 VIEW

[chest ap]
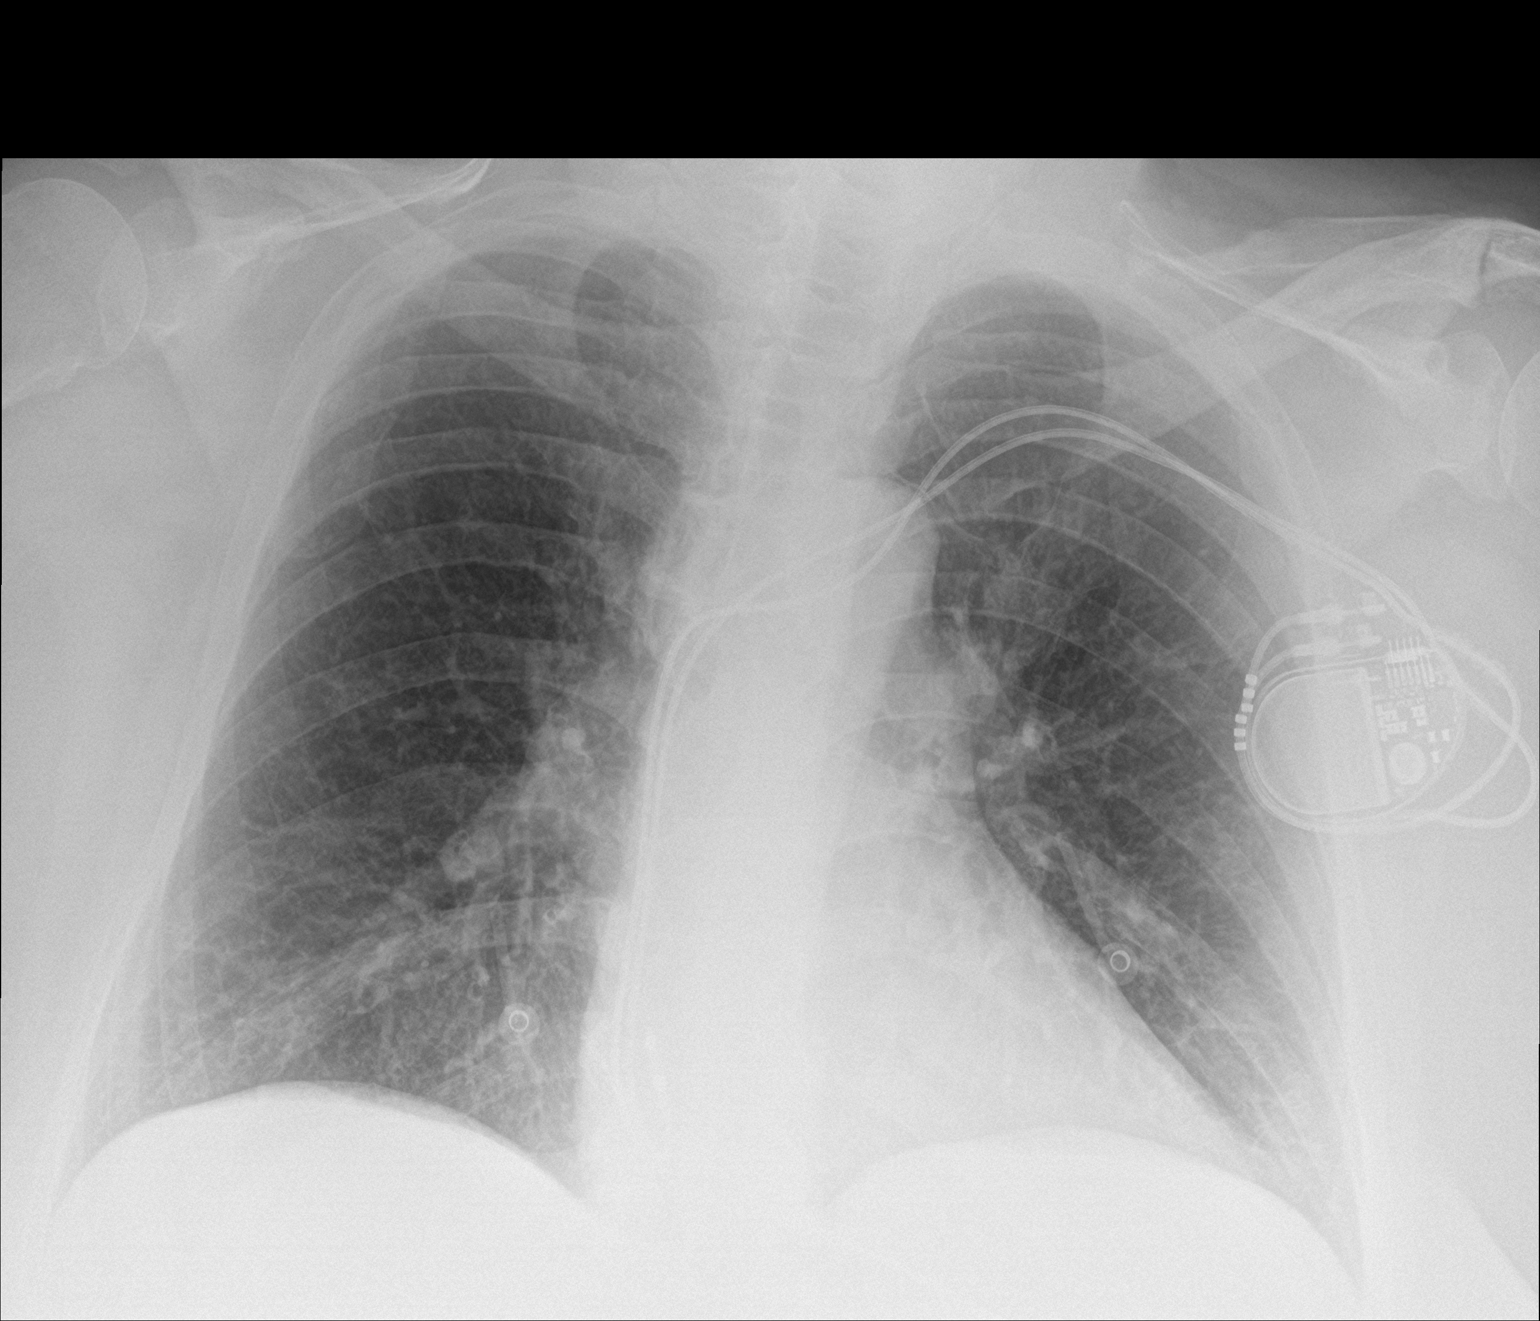

[1 of 1 positions shown; findings below may reference images not displayed]

FINDINGS: Single frontal view of the chest demonstrates stable dual lead pacer
overlying left chest. Cardiac silhouette is unremarkable. No
airspace disease, effusion, or pneumothorax. No acute bony
abnormalities.
IMPRESSION: 1. No acute intrathoracic process.

## 2023-06-25 ENCOUNTER — Other Ambulatory Visit (HOSPITAL_COMMUNITY): Payer: Self-pay | Admitting: Physician Assistant

## 2023-06-26 ENCOUNTER — Other Ambulatory Visit: Payer: Self-pay

## 2023-06-26 MED ORDER — POTASSIUM CHLORIDE ER 10 MEQ PO TBCR
10.0000 meq | EXTENDED_RELEASE_TABLET | Freq: Every day | ORAL | 1 refills | Status: DC
  Filled 2023-06-26: qty 90, 90d supply, fill #0

## 2023-06-27 ENCOUNTER — Encounter: Payer: Self-pay | Admitting: Cardiology

## 2023-06-29 ENCOUNTER — Other Ambulatory Visit (HOSPITAL_COMMUNITY): Payer: Self-pay

## 2023-06-29 ENCOUNTER — Other Ambulatory Visit: Payer: Self-pay

## 2023-07-01 ENCOUNTER — Other Ambulatory Visit (HOSPITAL_COMMUNITY): Payer: Self-pay

## 2023-07-03 ENCOUNTER — Other Ambulatory Visit: Payer: Self-pay

## 2023-07-03 ENCOUNTER — Other Ambulatory Visit (HOSPITAL_COMMUNITY): Payer: Self-pay

## 2023-07-12 ENCOUNTER — Other Ambulatory Visit (HOSPITAL_COMMUNITY): Payer: Self-pay

## 2023-07-12 ENCOUNTER — Encounter (HOSPITAL_COMMUNITY): Payer: Self-pay | Admitting: Psychiatry

## 2023-07-12 ENCOUNTER — Other Ambulatory Visit (HOSPITAL_BASED_OUTPATIENT_CLINIC_OR_DEPARTMENT_OTHER): Payer: Self-pay

## 2023-07-12 ENCOUNTER — Telehealth (HOSPITAL_COMMUNITY): Payer: HMO | Admitting: Psychiatry

## 2023-07-12 DIAGNOSIS — F331 Major depressive disorder, recurrent, moderate: Secondary | ICD-10-CM

## 2023-07-12 DIAGNOSIS — F3181 Bipolar II disorder: Secondary | ICD-10-CM | POA: Diagnosis not present

## 2023-07-12 DIAGNOSIS — F5102 Adjustment insomnia: Secondary | ICD-10-CM | POA: Diagnosis not present

## 2023-07-12 MED ORDER — LAMOTRIGINE 100 MG PO TABS
100.0000 mg | ORAL_TABLET | Freq: Every day | ORAL | 0 refills | Status: DC
  Filled 2023-07-12: qty 90, 90d supply, fill #0

## 2023-07-12 NOTE — Progress Notes (Signed)
[facility] Follow up visit   Patient Identification: Mary Mcfarland MRN:  [id number] Date of Evaluation:  07/12/2023 Referral Source: primary care Chief Complaint:  depression follow up , moodiness Visit Diagnosis:    ICD-10-CM   1. Bipolar 2 disorder (HCC)  F31.81     2. Adjustment insomnia  F51.02     3. MDD (major depressive disorder), recurrent episode, moderate (HCC)  F33.1     Virtual Visit via Video Note  I connected with Mary Mcfarland on 07/12/23 at  1:00 PM EST by a video enabled telemedicine application and verified that I am speaking with the correct person using two identifiers.  Location: Patient: home  Provider: home office   I discussed the limitations of evaluation and management by telemedicine and the availability of in person appointments. The patient expressed understanding and agreed to proceed.      I discussed the assessment and treatment plan with the patient. The patient was provided an opportunity to ask questions and all were answered. The patient agreed with the plan and demonstrated an understanding of the instructions.   The patient was advised to call back or seek an in-person evaluation if the symptoms worsen or if the condition fails to improve as anticipated.  I provided 18 minutes of non-face-to-face time during this encounter.     I discussed the limitations of evaluation and management by telemedicine and the availability of in person appointments. The patient expressed understanding and agreed to proceed.      History of Present Illness: Patient is a 68 years old currently single Caucasian female initially referred by primary care physician and cardiology for management and establishment of depression She has been seeing a psychiatrist for many years he closed the practice and she started seeing a psychiatrist at [facility] this year till they closed.    Follows with providers for medical conditions, AFib   On eval doing fair,  moving to a newer apartment in [place], looking forward to that Daughter is support system Handling meds, no rash on lamictal    Sleep fair with ambien    Aggravating factors; health issues Modifying factors; grand kids Severity; fair and manageable   Patient denies drug use or alcohol use     Past Psychiatric History: Depression, possible bipolar  Previous Psychotropic Medications: Yes     Past Medical History:  Past Medical History:  Diagnosis Date   Anticoagulant long-term use    eliquis--- managed by cardiology   Asthma, mild persistent    pulmonologist--- dr Irma Newness;  (04-08-2022  pt stated last rescue inhaler use 2 months ago)   Bipolar 2 disorder (HCC)    Cardiac pacemaker in situ 02/06/2020   for tachy-brady syndrome and symptomatic bradycardia;   [employer]  (ep cardiologist--- dr Johney Frame)   Cystocele with rectocele    Difficult intubation 03/2016   04-08-2022  pt stated was scheduled for achilles tendon repair @ [facility] prior to when was done @ [facility] on 10-03-207,  when she woke up was told by nurse the surgery was note done due to Narrow airway and did not have what was needed so surgery was rescheduled (no documentation of any type in epic about this incident) per anesthesia record from 03-15-2016 pt had no issues and pt stated no issues   Diverticulosis of colon    DOE (dyspnea on exertion)    pt stated due to aftrial fib, sob,  is able to do household chores but with long distance  walking and stairs get sob   Edema of both lower extremities    GERD (gastroesophageal reflux disease)    History of adenomatous polyp of colon    History of diverticulitis of colon    Hyperlipidemia    Hypertension    Longstanding persistent atrial fibrillation [facility]) 2017   cardiologist-- dr c. Hughie Closs;  for dx 2017 the reacurrance 01/ 2021;  s/p DCCV 07-24-2019;  admitted for tikosyn loading 03/ 2021;  sss s/p dual PPM 02-06-2020   MDD (major depressive disorder)    OA  (osteoarthritis)    OSA (obstructive sleep apnea)    sleep study in epic 04-06-2021 OSA and nocturnal hypoxemia, per pt was unable to tolerate bipap recommended but does have appointment to get dental appliance   Pelvic prolapse    PONV (postoperative nausea and vomiting)    Pre-diabetes    Sciatica of right side    SSS (sick sinus syndrome) (HCC)    Thyroid nodule    followed by pcp;  last ultrasound in epic 12-09-2011 pt stated never had bx done   Vitamin D deficiency    Wears glasses     Past Surgical History:  Procedure Laterality Date   ACHILLES TENDON SURGERY Right 08/02/2022   Procedure: RIGHT HEEL PUMP BUMP EXCISION AND ACHILLES TENDON REPAIR;  Surgeon: Marcene Corning, MD;  Location: [facility];  Service: Orthopedics;  Laterality: Right;   ANTERIOR AND POSTERIOR REPAIR N/A 04/14/2022   Procedure: POSTERIOR REPAIR (RECTOCELE);  Surgeon: Silverio Lay, MD;  Location: [facility];  Service: Gynecology;  Laterality: N/A;   CARDIOVERSION N/A 07/24/2019   Procedure: CARDIOVERSION;  Surgeon: Jodelle Red, MD;  Location: [facility]OSCOPY;  Service: Cardiovascular;  Laterality: N/A;   COLONOSCOPY WITH PROPOFOL  11/2020   dr Marca Ancona   HEEL SPUR RESECTION Left 03/15/2016   Procedure: ACHILLIES TENDON REPAIR AND PUMP BUMP EXCISION;  Surgeon: Marcene Corning, MD;  Location: [facility];  Service: Orthopedics;  Laterality: Left;   PACEMAKER IMPLANT N/A 02/06/2020   Procedure: PACEMAKER IMPLANT;  Surgeon: Hillis Range, MD;  Location: [facility] INVASIVE CV LAB;  Service: Cardiovascular;  Laterality: N/A;   PERINEOPLASTY N/A 04/14/2022   Procedure: PERINEOPLASTY;  Surgeon: Silverio Lay, MD;  Location: [facility];  Service: Gynecology;  Laterality: N/A;   SHOULDER ARTHROSCOPY Right 11/15/2005   @[facility]  by dr Jerl Santos   TOE SURGERY Right 2011   bone spur   TONSILLECTOMY  1979    Family Psychiatric History: Mom diagnosed with possible bipolar  Family History:  Family  History  Problem Relation Age of Onset   Heart disease Mother    CVA Mother    Hypertension Mother    Cancer Mother    Stroke Mother    Depression Mother    Bipolar disorder Mother    Obesity Mother    Heart disease Father    Hypertension Father    Heart attack Father    Diabetes Father    Liver disease Father    Alcoholism Father    Drug abuse Father    Obesity Father     Social History:   Social History   Socioeconomic History   Marital status: Divorced    Spouse name: Not on file   Number of children: Not on file   Years of education: Not on file   Highest education level: Not on file  Occupational History   Occupation: retired  Tobacco Use   Smoking status: Former    Current packs/day: 0.00  Average packs/day: 1 pack/day for 22.0 years (22.0 ttl pk-yrs)    Types: Cigarettes    Start date: 02/12/1974    Quit date: 02/13/1996    Years since quitting: 27.4    Passive exposure: Past   Smokeless tobacco: Never   Tobacco comments:    Former smoker 05/04/23  Vaping Use   Vaping status: Never Used  Substance and Sexual Activity   Alcohol use: Not Currently    Comment: occassionally   Drug use: Never   Sexual activity: Not Currently  Other Topics Concern   Not on file  Social History Narrative   Lives in [place] alone   Work at [employer]   Social Drivers of Corporate investment banker Strain: Not on file  Food Insecurity: Not on file  Transportation Needs: Not on file  Physical Activity: Not on file  Stress: Not on file  Social Connections: Not on file     Allergies:   Allergies  Allergen Reactions   Niacin Hives    Metabolic Disorder Labs: Lab Results  Component Value Date   HGBA1C 6.0 (H) 07/20/2022   MPG 125.5 07/20/2022   No results found for: "PROLACTIN" Lab Results  Component Value Date   CHOL 168 01/14/2020   TRIG 78 01/14/2020   HDL 61 01/14/2020   CHOLHDL 3.1 11/13/2019   LDLCALC 92 01/14/2020   LDLCALC 101  (H) 11/13/2019   Lab Results  Component Value Date   TSH 3.610 01/14/2020    Therapeutic Level Labs: No results found for: "LITHIUM" No results found for: "CBMZ" No results found for: "VALPROATE"  Current Medications: See chart    Psychiatric Specialty Exam: Review of Systems  Cardiovascular:  Negative for chest pain.  Skin:  Negative for rash.  Psychiatric/Behavioral:  Negative for hallucinations.     Last menstrual period 09/24/2013.There is no height or weight on file to calculate BMI.  General Appearance: Casual  Eye Contact:  Fair  Speech:  Normal Rate  Volume:  Normal  Mood: fair  Affect:  Congruent  Thought Process:  Goal Directed  Orientation:  Full (Time, Place, and Person)  Thought Content:  Rumination  Suicidal Thoughts:  No  Homicidal Thoughts:  No  Memory:  Immediate;   Fair Recent;   Fair  Judgement:  Fair  Insight:  Shallow  Psychomotor Activity:  decreased  Concentration:  Concentration: Fair and Attention Span: Fair  Recall:  [employer] of Knowledge:Fair  Language: Good  Akathisia:  No  Handed:   AIMS (if indicated):  not done  Assets:  Desire for Improvement Housing Social Support  ADL's:  Intact  Cognition: WNL  Sleep:   variable   Screenings: Insurance account manager from 04/05/2022 in [facility] Office Visit from 01/14/2020 in [facility] at [facility] Total Score 2 6  PHQ-9 Total Score 8 14      Flowsheet Row Video Visit from 04/05/2023 in [facility] at [facility] ED from 08/06/2022 in [facility] Emergency Department at [facility] Admission (Discharged) from 08/02/2022 in [facility]  C-SSRS RISK CATEGORY No Risk No Risk No Risk       Assessment and Plan: as follows   Prior documentation reviewed    Bipolar 2 ; depressed ; doing fair, continue lamictal for  mood. No rash Depression; improved and keeping active, has family support continue  lamictal.   Insomnia:manageable with Remus Loffler, continue sleep hygiene   Thresa Ross, MD 1/29/20251:11 PM

## 2023-07-17 ENCOUNTER — Other Ambulatory Visit (HOSPITAL_COMMUNITY): Payer: Self-pay

## 2023-07-17 ENCOUNTER — Other Ambulatory Visit (HOSPITAL_BASED_OUTPATIENT_CLINIC_OR_DEPARTMENT_OTHER): Payer: Self-pay

## 2023-07-17 ENCOUNTER — Telehealth (HOSPITAL_BASED_OUTPATIENT_CLINIC_OR_DEPARTMENT_OTHER): Payer: Self-pay

## 2023-07-17 NOTE — Telephone Encounter (Signed)
Called pt per the following message from Dr. Cristal Deer:  pharmacy messaged me and said she is asking for her furosemide prescription to be twice a day. I haven't seen her since August, and it was 40 mg daily at that time. Can you call her and ask when/who increased her lasix? I'm ok with it being BID if that is what she needs, but we need to know that so we can follow her kidneys. Thanks.   No answer at this time. Left VM for pt to call back.

## 2023-07-17 NOTE — Progress Notes (Signed)
 Please disregard- opened in error

## 2023-07-19 ENCOUNTER — Other Ambulatory Visit (HOSPITAL_COMMUNITY): Payer: HMO

## 2023-07-20 ENCOUNTER — Ambulatory Visit (HOSPITAL_COMMUNITY)
Admission: RE | Admit: 2023-07-20 | Discharge: 2023-07-20 | Disposition: A | Discharge status: Home / Self Care | Payer: PPO | Source: Ambulatory Visit | Attending: Cardiology | Admitting: Cardiology

## 2023-07-20 DIAGNOSIS — Z79899 Other long term (current) drug therapy: Secondary | ICD-10-CM | POA: Diagnosis present

## 2023-07-20 DIAGNOSIS — I4819 Other persistent atrial fibrillation: Secondary | ICD-10-CM | POA: Diagnosis present

## 2023-07-20 DIAGNOSIS — Z950 Presence of cardiac pacemaker: Secondary | ICD-10-CM | POA: Diagnosis present

## 2023-07-20 MED ORDER — IOHEXOL 350 MG/ML SOLN
95.0000 mL | Freq: Once | INTRAVENOUS | Status: AC | PRN
  Administered 2023-07-20: 95 mL via INTRAVENOUS

## 2023-07-21 ENCOUNTER — Encounter (HOSPITAL_COMMUNITY): Payer: Self-pay

## 2023-07-23 ENCOUNTER — Encounter (HOSPITAL_BASED_OUTPATIENT_CLINIC_OR_DEPARTMENT_OTHER): Payer: Self-pay

## 2023-07-23 DIAGNOSIS — R600 Localized edema: Secondary | ICD-10-CM

## 2023-07-23 DIAGNOSIS — R0609 Other forms of dyspnea: Secondary | ICD-10-CM

## 2023-07-24 ENCOUNTER — Other Ambulatory Visit (HOSPITAL_BASED_OUTPATIENT_CLINIC_OR_DEPARTMENT_OTHER): Payer: Self-pay

## 2023-07-24 ENCOUNTER — Other Ambulatory Visit (HOSPITAL_COMMUNITY): Payer: Self-pay

## 2023-07-24 ENCOUNTER — Other Ambulatory Visit (HOSPITAL_BASED_OUTPATIENT_CLINIC_OR_DEPARTMENT_OTHER): Payer: Self-pay | Admitting: Cardiology

## 2023-07-24 DIAGNOSIS — R600 Localized edema: Secondary | ICD-10-CM

## 2023-07-24 DIAGNOSIS — R0609 Other forms of dyspnea: Secondary | ICD-10-CM

## 2023-07-24 MED ORDER — FUROSEMIDE 40 MG PO TABS
80.0000 mg | ORAL_TABLET | Freq: Every day | ORAL | 1 refills | Status: DC
  Filled 2023-07-24 – 2023-08-01 (×5): qty 180, 90d supply, fill #0
  Filled ????-??-??: fill #0

## 2023-07-24 NOTE — Addendum Note (Signed)
 Addended by: Zorita Hiss on: 07/24/2023 10:02 AM   Modules accepted: Orders

## 2023-07-24 NOTE — Telephone Encounter (Signed)
 Rx has been sent. Please see myChart encounter.

## 2023-07-24 NOTE — Telephone Encounter (Signed)
Rx sent in previous encounter.

## 2023-07-27 ENCOUNTER — Other Ambulatory Visit (HOSPITAL_COMMUNITY): Payer: Self-pay

## 2023-07-27 ENCOUNTER — Other Ambulatory Visit: Payer: Self-pay

## 2023-08-01 ENCOUNTER — Other Ambulatory Visit: Payer: Self-pay

## 2023-08-01 ENCOUNTER — Other Ambulatory Visit (HOSPITAL_BASED_OUTPATIENT_CLINIC_OR_DEPARTMENT_OTHER): Payer: Self-pay

## 2023-08-01 ENCOUNTER — Other Ambulatory Visit (HOSPITAL_COMMUNITY): Payer: Self-pay

## 2023-08-01 DIAGNOSIS — Z950 Presence of cardiac pacemaker: Secondary | ICD-10-CM | POA: Diagnosis not present

## 2023-08-01 DIAGNOSIS — I4819 Other persistent atrial fibrillation: Secondary | ICD-10-CM | POA: Diagnosis not present

## 2023-08-01 DIAGNOSIS — Z79899 Other long term (current) drug therapy: Secondary | ICD-10-CM | POA: Diagnosis not present

## 2023-08-02 LAB — CBC
Hematocrit: 42 % (ref 34.0–46.6)
Hemoglobin: 13.5 g/dL (ref 11.1–15.9)
MCH: 28.5 pg (ref 26.6–33.0)
MCHC: 32.1 g/dL (ref 31.5–35.7)
MCV: 89 fL (ref 79–97)
Platelets: 344 10*3/uL (ref 150–450)
RBC: 4.74 x10E6/uL (ref 3.77–5.28)
RDW: 12.6 % (ref 11.7–15.4)
WBC: 11.4 10*3/uL — ABNORMAL HIGH (ref 3.4–10.8)

## 2023-08-02 LAB — BASIC METABOLIC PANEL
BUN/Creatinine Ratio: 19 (ref 12–28)
BUN: 17 mg/dL (ref 8–27)
CO2: 24 mmol/L (ref 20–29)
Calcium: 9.5 mg/dL (ref 8.7–10.3)
Chloride: 103 mmol/L (ref 96–106)
Creatinine, Ser: 0.88 mg/dL (ref 0.57–1.00)
Glucose: 115 mg/dL — ABNORMAL HIGH (ref 70–99)
Potassium: 4.5 mmol/L (ref 3.5–5.2)
Sodium: 142 mmol/L (ref 134–144)
eGFR: 73 mL/min/{1.73_m2} (ref >59)

## 2023-08-08 ENCOUNTER — Telehealth (HOSPITAL_COMMUNITY): Payer: Self-pay | Admitting: *Deleted

## 2023-08-08 ENCOUNTER — Encounter: Payer: Self-pay | Admitting: Cardiology

## 2023-08-08 MED ORDER — LAMOTRIGINE 100 MG PO TABS: 100.0000 mg | ORAL_TABLET | Freq: Every day | ORAL | 0 refills | Status: DC

## 2023-08-08 NOTE — Addendum Note (Signed)
 Addended by: Thresa Ross on: 08/08/2023 08:56 AM   Modules accepted: Orders

## 2023-08-08 NOTE — Telephone Encounter (Signed)
 [id number] - [place], [place] - [address]   lamoTRIgine (LAMICTAL) 100 MG tablet  PUT BACK IN STOCK NOT PICKED UP IN TIMELY MANNER   NEXT APPT 09/19/23 LAST APPT 07/12/23

## 2023-08-09 ENCOUNTER — Ambulatory Visit (INDEPENDENT_AMBULATORY_CARE_PROVIDER_SITE_OTHER): Payer: PPO

## 2023-08-09 DIAGNOSIS — I495 Sick sinus syndrome: Secondary | ICD-10-CM | POA: Diagnosis not present

## 2023-08-09 NOTE — Telephone Encounter (Signed)
 Left message for patient to call back

## 2023-08-10 ENCOUNTER — Encounter: Payer: Self-pay | Admitting: Emergency Medicine

## 2023-08-10 LAB — CUP PACEART REMOTE DEVICE CHECK
Battery Remaining Longevity: 61 mo
Battery Remaining Percentage: 65 %
Battery Voltage: 2.99 V
Brady Statistic AP VP Percent: 76 %
Brady Statistic AP VS Percent: 14 %
Brady Statistic AS VP Percent: 4.8 %
Brady Statistic AS VS Percent: 4.8 %
Brady Statistic RA Percent Paced: 89 %
Brady Statistic RV Percent Paced: 81 %
Date Time Interrogation Session: 20250226040040
Implantable Lead Connection Status: 753985
Implantable Lead Connection Status: 753985
Implantable Lead Implant Date: 20210826
Implantable Lead Implant Date: 20210826
Implantable Lead Location: 753859
Implantable Lead Location: 753860
Implantable Pulse Generator Implant Date: 20210826
Lead Channel Impedance Value: 450 Ohm
Lead Channel Impedance Value: 510 Ohm
Lead Channel Pacing Threshold Amplitude: 1 V
Lead Channel Pacing Threshold Amplitude: 1.25 V
Lead Channel Pacing Threshold Pulse Width: 0.4 ms
Lead Channel Pacing Threshold Pulse Width: 0.5 ms
Lead Channel Sensing Intrinsic Amplitude: 3.4 mV
Lead Channel Sensing Intrinsic Amplitude: 3.6 mV
Lead Channel Setting Pacing Amplitude: 2.5 V
Lead Channel Setting Pacing Amplitude: 2.5 V
Lead Channel Setting Pacing Pulse Width: 0.4 ms
Lead Channel Setting Sensing Sensitivity: 2 mV
Pulse Gen Model: 2272
Pulse Gen Serial Number: 3859348

## 2023-08-10 IMAGING — CT CT ABD-PELV W/ CM
2 of 5 series · 16 of 46 positions shown, 18 images · IV contrast (APPLIED)
Comparison: None Available.

CLINICAL DATA: Left lower quadrant abdominal pain

EXAM:
CT ABDOMEN AND PELVIS WITH CONTRAST
TECHNIQUE: Multidetector CT imaging of the abdomen and pelvis was performed
using the standard protocol following bolus administration of
intravenous contrast.

[Series 2: abd pel w · axial · 0.90mm/px · z∈[+477,+892]mm · 13 of 93 slices shown, 15 images]
[im 5/93  soft-tissue]
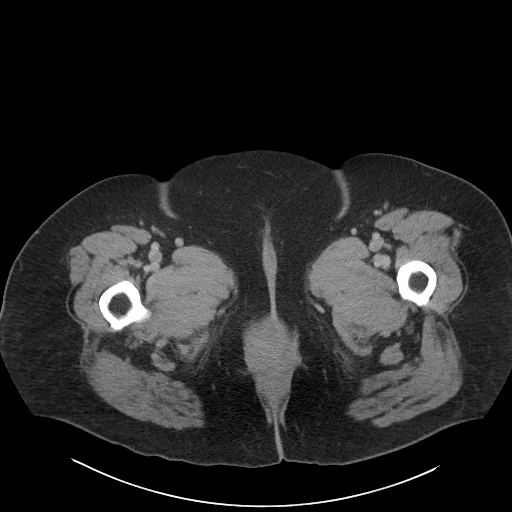
[im 5/93  bone]
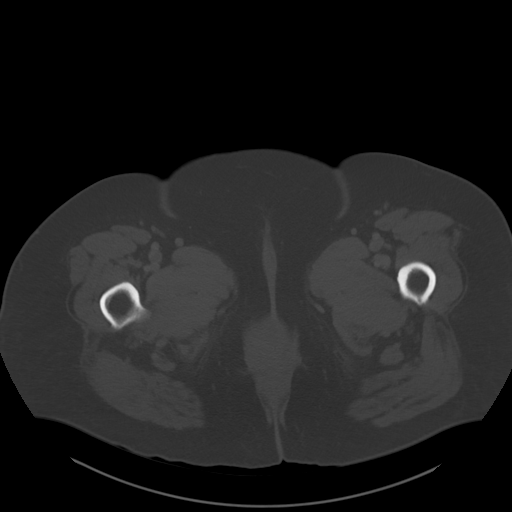
[im 15/93  soft-tissue]
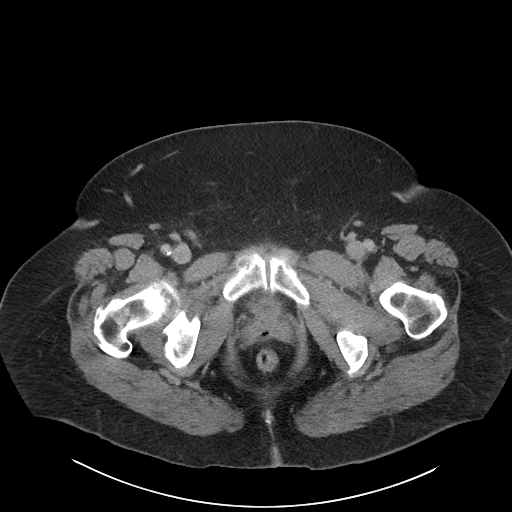
[im 20/93  soft-tissue]
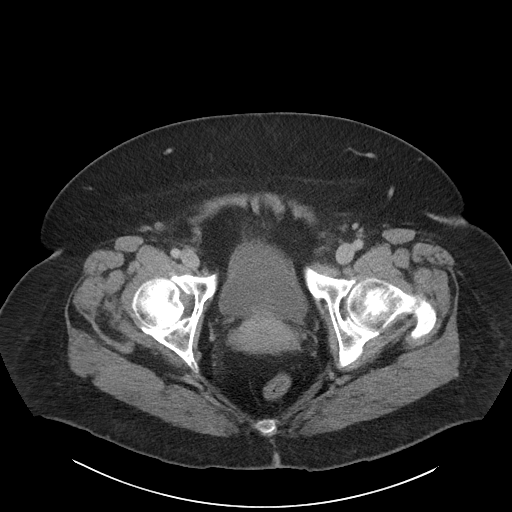
[im 25/93  soft-tissue]
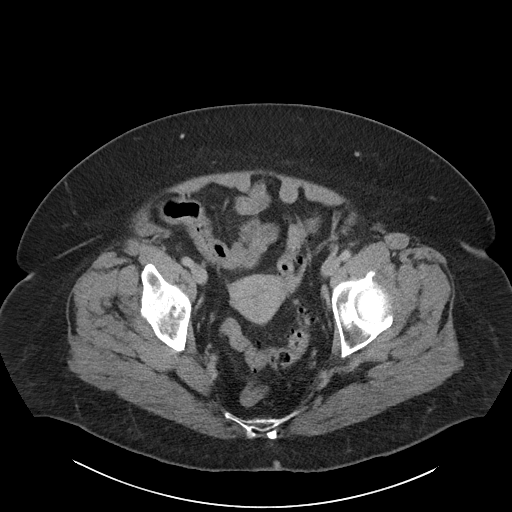
[im 34/93  soft-tissue]
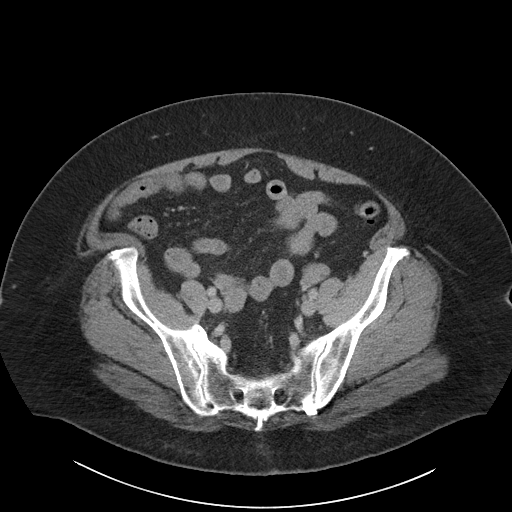
[im 39/93  soft-tissue]
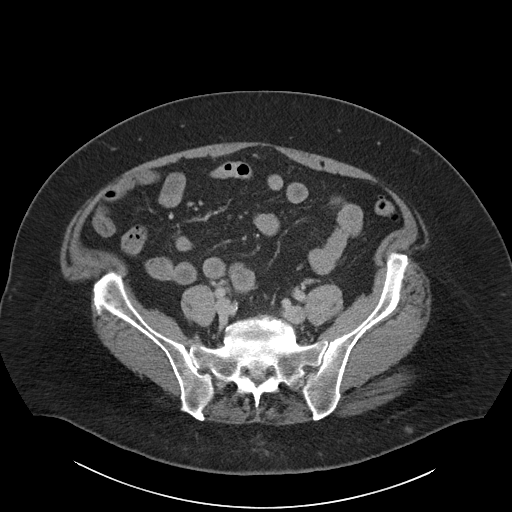
[im 49/93  soft-tissue]
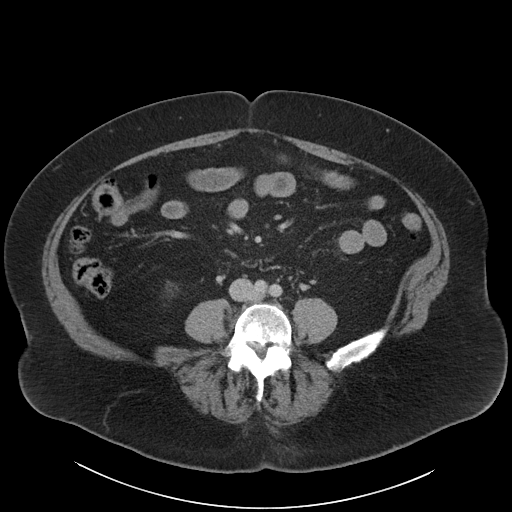
[im 54/93  soft-tissue]
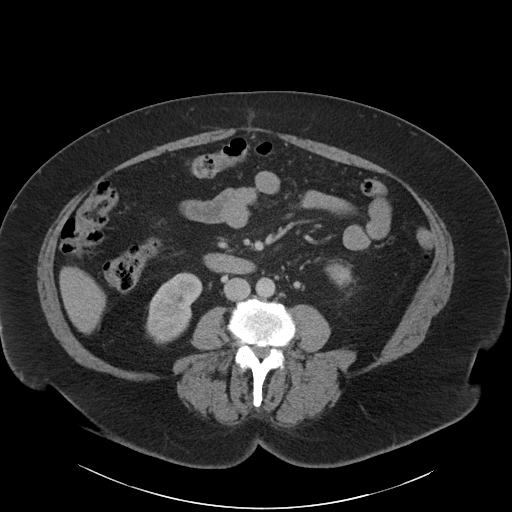
[im 59/93  soft-tissue]
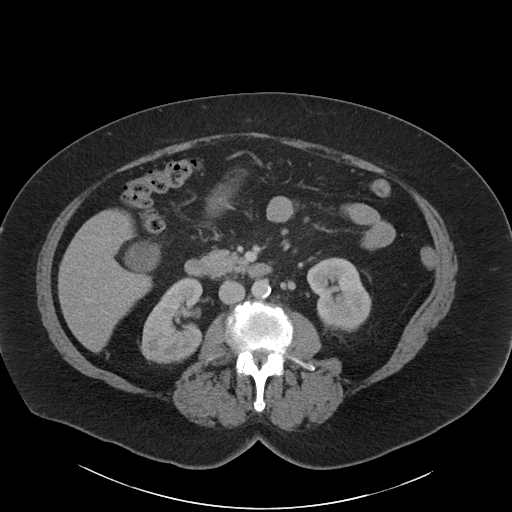
[im 59/93  bone]
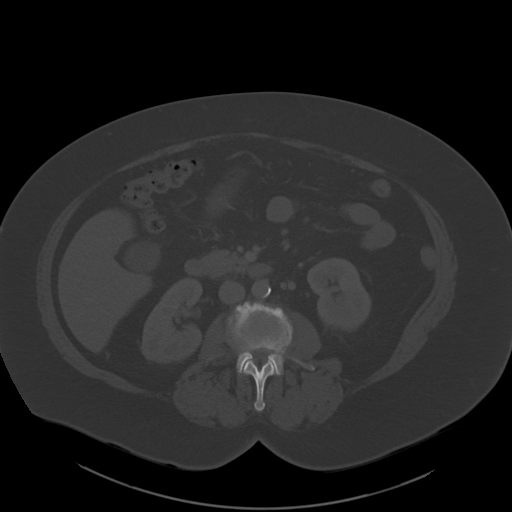
[im 68/93  soft-tissue]
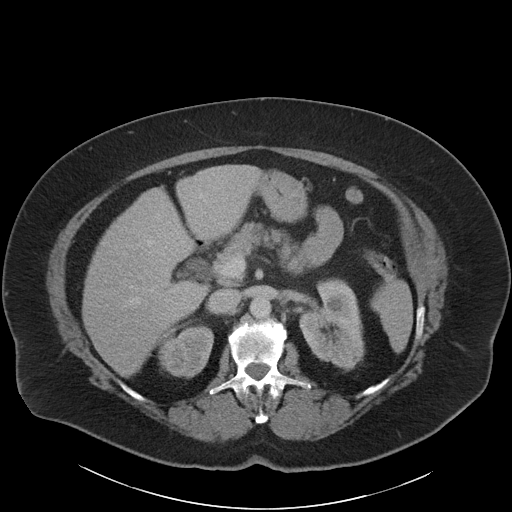
[im 73/93  soft-tissue]
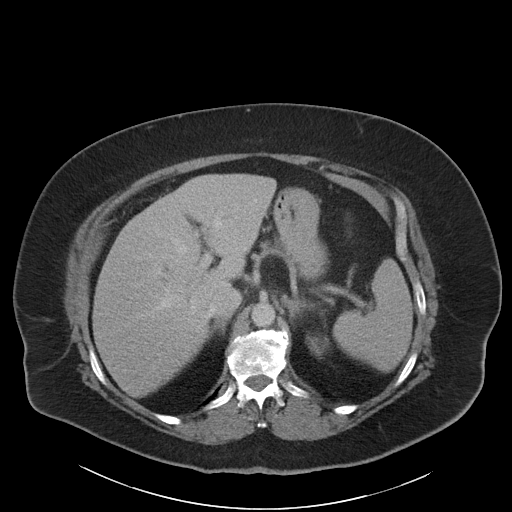
[im 78/93  soft-tissue]
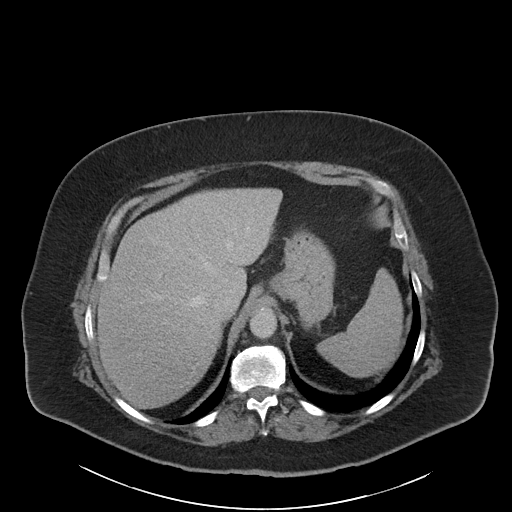
[im 88/93  soft-tissue]
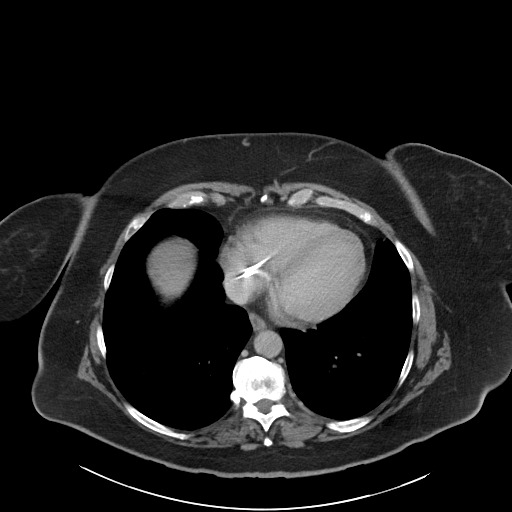

[Series 5: coronal · coronal · 0.93mm/px · 3 of 119 slices shown]
[im 40/119  soft-tissue]
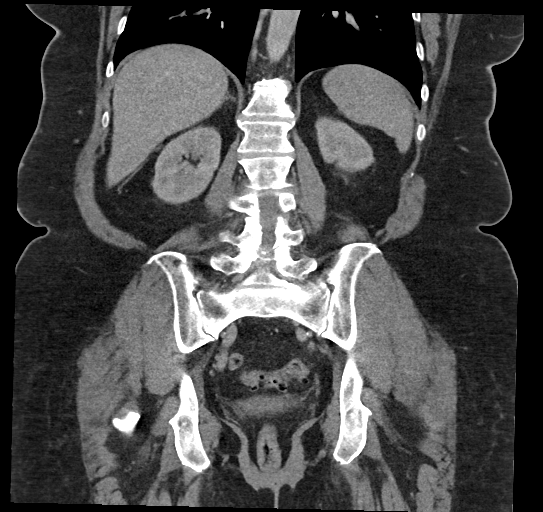
[im 53/119  soft-tissue]
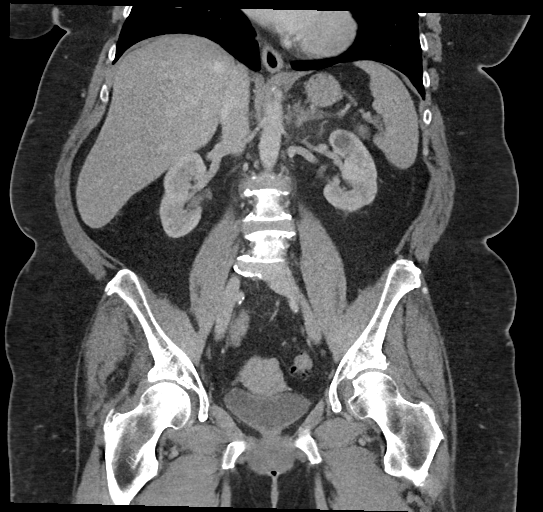
[im 66/119  soft-tissue]
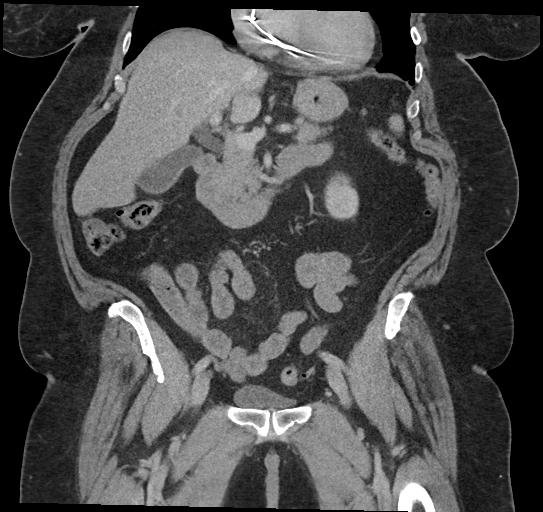

[16 of 46 positions shown; findings below may reference images not displayed]

RADIATION DOSE REDUCTION: This exam was performed according to the
departmental dose-optimization program which includes automated
exposure control, adjustment of the mA and/or kV according to
patient size and/or use of iterative reconstruction technique.

CONTRAST:  100mL OMNIPAQUE IOHEXOL 300 MG/ML  SOLN
FINDINGS: Lower chest: Included lung bases are clear. Heart size within normal
limits.

Hepatobiliary: No focal liver abnormality is seen. No gallstones,
gallbladder wall thickening, or biliary dilatation.

Pancreas: Unremarkable. No pancreatic ductal dilatation or
surrounding inflammatory changes.

Spleen: Normal in size without focal abnormality.

Adrenals/Urinary Tract: Unremarkable adrenal glands. 1.6 cm cyst in
the upper pole of the left kidney which does not require follow-up.
Kidneys enhance symmetrically. No renal stone or hydronephrosis.
Urinary bladder within normal limits for the degree of distension.

Stomach/Bowel: Stomach is within normal limits. No dilated loops of
bowel. Normal appendix in the right lower quadrant. Diverticulosis
of the descending and sigmoid colon. Mild pericolonic fat stranding
in the region of the distal sigmoid colon may represent early
changes of acute diverticulitis (series 2, image 71).

Vascular/Lymphatic: Scattered aortoiliac atherosclerotic
calcifications without aneurysm. No abdominopelvic lymphadenopathy.

Reproductive: Uterus and bilateral adnexa are unremarkable.

Other: No free fluid. No abdominopelvic fluid collection. No
pneumoperitoneum. Tiny fat containing umbilical hernia.

Musculoskeletal: No acute or significant osseous findings.
Degenerative lumbar spondylosis, most pronounced at L4-5.
IMPRESSION: Diverticulosis of the descending and sigmoid colon with mild
pericolonic fat stranding in the region of the distal sigmoid colon
may represent early changes of acute diverticulitis.

Aortic Atherosclerosis ([id number]-[id number].Z).

## 2023-08-10 NOTE — Pre-Procedure Instructions (Signed)
 Attempted to call patient regarding procedure instructions.  Left voicemail on the following items: Arrival time 0515 Nothing to eat or drink after midnight No meds AM of procedure Responsible person to drive you home and stay with you for 24 hrs  Have you missed any doses of anti-coagulant Eliquis- should be taken twice a day, if he missed any doses please let us know.  Don't take dose in the morning.

## 2023-08-10 NOTE — Anesthesia Preprocedure Evaluation (Signed)
 Anesthesia Evaluation  Patient identified by MRN, date of birth, ID band Patient awake    Reviewed: Allergy & Precautions, H&P , NPO status , Patient's Chart, lab work & pertinent test results  History of Anesthesia Complications (+) PONV, DIFFICULT AIRWAY and history of anesthetic complications  Airway Mallampati: III  TM Distance: >3 FB Neck ROM: Full    Dental  (+) Dental Advisory Given, Teeth Intact   Pulmonary asthma , sleep apnea , former smoker   Pulmonary exam normal breath sounds clear to auscultation       Cardiovascular Exercise Tolerance: Good hypertension, Pt. on medications + DOE  Normal cardiovascular exam+ dysrhythmias Atrial Fibrillation + pacemaker  Rhythm:Regular Rate:Normal  Pacer interrogation 07/2023 AP-VP 76% AP-VS 14% AS-VP 4.8% AS-VS 5.8%   Myocardial Perfusion 02/28/2020  Lexiscan stress is electrically negative for ischemia  Myovue scan shows normal perfusion  LVEf 67%  This is a low risk study.   Echo 07/01/2019 1. Left ventricular ejection fraction, by visual estimation, is 60 to 65%. The left ventricle has normal function. There is mildly increased left ventricular hypertrophy.   2. Left ventricular diastolic parameters are indeterminate.   3. The left ventricle has no regional wall motion abnormalities.   4. Global right ventricle has normal systolic function.The right ventricular size is normal. No increase in right ventricular wall thickness.   5. Left atrial size was moderately dilated.   6. Right atrial size was normal.   7. The mitral valve is normal in structure. Trivial mitral valve regurgitation. No evidence of mitral stenosis.   8. The tricuspid valve is normal in structure.   9. The tricuspid valve is normal in structure. Tricuspid valve regurgitation is not demonstrated.  10. The aortic valve is tricuspid. Aortic valve regurgitation is not visualized. Mild to moderate aortic  valve sclerosis/calcification without any evidence of aortic stenosis.  11. Calcified non coronary cusp.  12. The pulmonic valve was normal in structure. Pulmonic valve regurgitation is not visualized.  13. The inferior vena cava is normal in size with greater than 50% respiratory variability, suggesting right atrial pressure of 3 mmHg.     Neuro/Psych  PSYCHIATRIC DISORDERS  Depression Bipolar Disorder   negative neurological ROS  negative psych ROS   GI/Hepatic Neg liver ROS,GERD  Controlled,,  Endo/Other  negative endocrine ROS  Class 3 obesity  Renal/GU negative Renal ROS     Musculoskeletal negative musculoskeletal ROS (+) Arthritis , Osteoarthritis,    Abdominal  (+) + obese  Peds  Hematology negative hematology ROS (+)   Anesthesia Other Findings   Reproductive/Obstetrics                             Anesthesia Physical Anesthesia Plan  ASA: 3  Anesthesia Plan: General   Post-op Pain Management: Tylenol PO (pre-op)*   Induction: Intravenous  PONV Risk Score and Plan: 3 and Ondansetron, Dexamethasone and Treatment may vary due to age or medical condition  Airway Management Planned: Oral ETT and Video Laryngoscope Planned  Additional Equipment: None  Intra-op Plan:   Post-operative Plan: Extubation in OR  Informed Consent: I have reviewed the patients History and Physical, chart, labs and discussed the procedure including the risks, benefits and alternatives for the proposed anesthesia with the patient or authorized representative who has indicated his/her understanding and acceptance.     Dental advisory given  Plan Discussed with: CRNA  Anesthesia Plan Comments:  Anesthesia Quick Evaluation  AP-VP 76% AP-VS 14% AS-VP 4.8% AS-VS 5.8%

## 2023-08-10 NOTE — Telephone Encounter (Signed)
 Spoke with pt who states there is nothing she needs she is just very anxious about her scheduled ablation tomorrow with Dr Lalla Brothers.  Pt offered reassurance that Dr Lalla Brothers is a very experienced EP provider who came to [facility] from the [facility].  Pt encouraged to discuss her concers and fears with Dr Lalla Brothers in the morning at the hospital prior to the ablation.  Pt verbalizes understanding and thanked Charity fundraiser for the callback.

## 2023-08-11 ENCOUNTER — Other Ambulatory Visit: Payer: Self-pay

## 2023-08-11 ENCOUNTER — Ambulatory Visit (HOSPITAL_COMMUNITY)
Admission: RE | Admit: 2023-08-11 | Discharge: 2023-08-11 | Disposition: A | Discharge status: Home / Self Care | Payer: PPO | Attending: Cardiology | Admitting: Cardiology

## 2023-08-11 ENCOUNTER — Telehealth: Payer: Self-pay

## 2023-08-11 ENCOUNTER — Ambulatory Visit (HOSPITAL_COMMUNITY): Payer: Self-pay | Admitting: Anesthesiology

## 2023-08-11 ENCOUNTER — Encounter: Payer: Self-pay | Admitting: Cardiology

## 2023-08-11 ENCOUNTER — Encounter (HOSPITAL_COMMUNITY)
Admission: RE | Disposition: A | Discharge status: Home / Self Care | Payer: PPO | Source: Home / Self Care | Attending: Cardiology

## 2023-08-11 DIAGNOSIS — I4819 Other persistent atrial fibrillation: Secondary | ICD-10-CM | POA: Diagnosis not present

## 2023-08-11 DIAGNOSIS — Z538 Procedure and treatment not carried out for other reasons: Secondary | ICD-10-CM | POA: Insufficient documentation

## 2023-08-11 SURGERY — ATRIAL FIBRILLATION ABLATION
Anesthesia: General

## 2023-08-11 MED ORDER — ACETAMINOPHEN 500 MG PO TABS
1000.0000 mg | ORAL_TABLET | Freq: Once | ORAL | Status: AC
  Administered 2023-08-11: 1000 mg via ORAL
  Filled 2023-08-11: qty 2

## 2023-08-11 MED ORDER — FENTANYL CITRATE (PF) 100 MCG/2ML IJ SOLN
INTRAMUSCULAR | Status: AC
  Filled 2023-08-11: qty 2

## 2023-08-11 MED ORDER — SODIUM CHLORIDE 0.9 % IV SOLN: INTRAVENOUS | Status: DC

## 2023-08-11 MED ORDER — MIDAZOLAM HCL 2 MG/2ML IJ SOLN
INTRAMUSCULAR | Status: AC
  Filled 2023-08-11: qty 2

## 2023-08-11 NOTE — H&P (View-Only) (Signed)
 Electrophysiology Office Follow up Visit Note:     Date:  08/11/2023    ID:  Mary Mcfarland, DOB 02/10/1956, MRN [id number]   PCP:  Daisy Floro, MD           [facility] Cardiologist:  Jodelle Red, MD  [facility] Electrophysiologist:  Lanier Prude, MD      Interval History:       Mary Mcfarland is a 68 y.o. female who presents for a follow up visit.    I last saw the patient October 01, 2021.  At that time she was on Tikosyn and Eliquis for her persistent atrial fibrillation.  She has a permanent pacemaker in situ for sick sinus syndrome that was working appropriately at the last appointment with me.   She contacted the office several months ago reporting atrial fibrillation with rapid ventricular rates.  She was dizzy with the episode and felt the palpitations.  She saw all Mary Mcfarland in the A-fib clinic May 05, 2023.  At that appointment she had been in atrial fibrillation for at least 1 week.  That was after she missed her evening medications several days earlier.  Given her young age and persistence of atrial fibrillation despite treatment with Tikosyn, she is referred back to discuss catheter ablation.   Presents for PVI today. Procedure reviewed with patient and family at bedside.   Objective Past medical, surgical, social and family history were reviewed.   ROS:   Please see the history of present illness.    All other systems reviewed and are negative.   EKGs/Labs/Other Studies Reviewed:     The following studies were reviewed today:   June 13, 2023 in clinic device interrogation personally reviewed AF burden 13% Lead parameter stable Battery longevity okay No changes made today with programming     EKG Interpretation Date/Time:                  Tuesday June 13 2023 14:17:52 EST Ventricular Rate:         71 PR Interval:                 242 QRS Duration:             88 QT Interval:                 436 QTC  Calculation:473 R Axis:                         -32   Text Interpretation:Atrial-sensed ventricular-paced rhythm with prolonged AV conduction with frequent AV dual-paced complexes Confirmed by Mary Mcfarland [phone]) on 06/13/2023 2:23:01 PM     Physical Exam:     VS:  BP 129/69 (BP Location: Left Arm, Patient Position: Sitting, Cuff Size: Large)   Pulse 75   Ht 5\' 6"  (1.676 m)   Wt 262 lb 3.2 oz (118.9 kg)   LMP 09/24/2013   SpO2 95%   BMI 42.32 kg/m         Wt Readings from Last 3 Encounters:  06/13/23 262 lb 3.2 oz (118.9 kg)  05/05/23 262 lb (118.8 kg)  03/15/23 264 lb 9.6 oz (120 kg)      GEN: no distress CARD: RRR, No MRG.  Pacemaker pocket well-healed RESP: No IWOB. CTAB.     Assessment ASSESSMENT:     1. Persistent atrial fibrillation (HCC)   2. Cardiac pacemaker in situ   3. Encounter for  long-term (current) use of high-risk medication     PLAN:     In order of problems listed above:   #Persistent atrial fibrillation #High risk med monitoring-Tikosyn The patient continues to have episodes of symptomatic atrial fibrillation despite treatment with Tikosyn.  We have discussed treatment options including alternative antiarrhythmic drugs and catheter ablation.  She would like to proceed with catheter ablation which I think is reasonable.  Given her young age, I would like to avoid exposure to amiodarone.   I have discussed the catheter ablation procedure in detail the patient including the risks and likelihood of success and she wishes to proceed.   Recent creatinine and EKG today acceptable for ongoing Tikosyn use.   Discussed treatment options today for AF including antiarrhythmic drug therapy and ablation. Discussed risks, recovery and likelihood of success with each treatment strategy. Risk, benefits, and alternatives to EP study and ablation for afib were discussed. These risks include but are not limited to stroke, bleeding, vascular damage, tamponade,  perforation, damage to the esophagus, lungs, phrenic nerve and other structures, pulmonary vein stenosis, worsening renal function, coronary vasospasm and death.  Discussed potential need for repeat ablation procedures and antiarrhythmic drugs after an initial ablation. The patient understands these risk and wishes to proceed.  We will therefore proceed with catheter ablation at the next available time.  Carto, ICE, anesthesia are requested for the procedure.  Will also obtain CT PV protocol prior to the procedure to exclude LAA thrombus and further evaluate atrial anatomy.   #Permanent pacemaker in situ #Sick sinus syndrome Device functioning appropriately.  Continue remote monitoring.     Presents for PVI today. Procedure reviewed.   Signed, Mary Dunn, MD, [facility], [facility] 08/11/2023 Electrophysiology [facility]

## 2023-08-11 NOTE — Telephone Encounter (Signed)
 Pt's Afib Ablation has been moved from 2/28 to 3/4 due to equipment failure in the lab. Pt is aware that procedure will be the same time.

## 2023-08-11 NOTE — H&P (Signed)
 Electrophysiology Office Follow up Visit Note:     Date:  08/11/2023    ID:  Mary Mcfarland, DOB 02/10/1956, MRN [id number]   PCP:  Daisy Floro, MD           [facility] Cardiologist:  Jodelle Red, MD  [facility] Electrophysiologist:  Lanier Prude, MD      Interval History:       Mary Mcfarland is a 68 y.o. female who presents for a follow up visit.    I last saw the patient October 01, 2021.  At that time she was on Tikosyn and Eliquis for her persistent atrial fibrillation.  She has a permanent pacemaker in situ for sick sinus syndrome that was working appropriately at the last appointment with me.   She contacted the office several months ago reporting atrial fibrillation with rapid ventricular rates.  She was dizzy with the episode and felt the palpitations.  She saw all Clide Cliff in the A-fib clinic May 05, 2023.  At that appointment she had been in atrial fibrillation for at least 1 week.  That was after she missed her evening medications several days earlier.  Given her young age and persistence of atrial fibrillation despite treatment with Tikosyn, she is referred back to discuss catheter ablation.   Presents for PVI today. Procedure reviewed with patient and family at bedside.   Objective Past medical, surgical, social and family history were reviewed.   ROS:   Please see the history of present illness.    All other systems reviewed and are negative.   EKGs/Labs/Other Studies Reviewed:     The following studies were reviewed today:   June 13, 2023 in clinic device interrogation personally reviewed AF burden 13% Lead parameter stable Battery longevity okay No changes made today with programming     EKG Interpretation Date/Time:                  Tuesday June 13 2023 14:17:52 EST Ventricular Rate:         71 PR Interval:                 242 QRS Duration:             88 QT Interval:                 436 QTC  Calculation:473 R Axis:                         -32   Text Interpretation:Atrial-sensed ventricular-paced rhythm with prolonged AV conduction with frequent AV dual-paced complexes Confirmed by Steffanie Dunn [phone]) on 06/13/2023 2:23:01 PM     Physical Exam:     VS:  BP 129/69 (BP Location: Left Arm, Patient Position: Sitting, Cuff Size: Large)   Pulse 75   Ht 5\' 6"  (1.676 m)   Wt 262 lb 3.2 oz (118.9 kg)   LMP 09/24/2013   SpO2 95%   BMI 42.32 kg/m         Wt Readings from Last 3 Encounters:  06/13/23 262 lb 3.2 oz (118.9 kg)  05/05/23 262 lb (118.8 kg)  03/15/23 264 lb 9.6 oz (120 kg)      GEN: no distress CARD: RRR, No MRG.  Pacemaker pocket well-healed RESP: No IWOB. CTAB.     Assessment ASSESSMENT:     1. Persistent atrial fibrillation (HCC)   2. Cardiac pacemaker in situ   3. Encounter for  long-term (current) use of high-risk medication     PLAN:     In order of problems listed above:   #Persistent atrial fibrillation #High risk med monitoring-Tikosyn The patient continues to have episodes of symptomatic atrial fibrillation despite treatment with Tikosyn.  We have discussed treatment options including alternative antiarrhythmic drugs and catheter ablation.  She would like to proceed with catheter ablation which I think is reasonable.  Given her young age, I would like to avoid exposure to amiodarone.   I have discussed the catheter ablation procedure in detail the patient including the risks and likelihood of success and she wishes to proceed.   Recent creatinine and EKG today acceptable for ongoing Tikosyn use.   Discussed treatment options today for AF including antiarrhythmic drug therapy and ablation. Discussed risks, recovery and likelihood of success with each treatment strategy. Risk, benefits, and alternatives to EP study and ablation for afib were discussed. These risks include but are not limited to stroke, bleeding, vascular damage, tamponade,  perforation, damage to the esophagus, lungs, phrenic nerve and other structures, pulmonary vein stenosis, worsening renal function, coronary vasospasm and death.  Discussed potential need for repeat ablation procedures and antiarrhythmic drugs after an initial ablation. The patient understands these risk and wishes to proceed.  We will therefore proceed with catheter ablation at the next available time.  Carto, ICE, anesthesia are requested for the procedure.  Will also obtain CT PV protocol prior to the procedure to exclude LAA thrombus and further evaluate atrial anatomy.   #Permanent pacemaker in situ #Sick sinus syndrome Device functioning appropriately.  Continue remote monitoring.     Presents for PVI today. Procedure reviewed.   Signed, Steffanie Dunn, MD, [facility], [facility] 08/11/2023 Electrophysiology [facility]

## 2023-08-12 ENCOUNTER — Encounter: Payer: Self-pay | Admitting: Cardiology

## 2023-08-14 NOTE — Pre-Procedure Instructions (Signed)
 Instructed patient on the following items: Arrival time 0515 Nothing to eat or drink after midnight No meds AM of procedure Responsible person to drive you home and stay with you for 24 hrs  Have you missed any doses of anti-coagulant Eliquis- takes twice a day, did missed Friday 08/11/23 evening dose,  Don't take dose in the morning.  Notified Dr Lalla Brothers will do EKG on arrival.

## 2023-08-15 ENCOUNTER — Encounter: Payer: Self-pay | Admitting: Cardiology

## 2023-08-15 ENCOUNTER — Ambulatory Visit (HOSPITAL_BASED_OUTPATIENT_CLINIC_OR_DEPARTMENT_OTHER): Payer: Self-pay | Admitting: Critical Care Medicine

## 2023-08-15 ENCOUNTER — Telehealth: Payer: Self-pay | Admitting: Cardiology

## 2023-08-15 ENCOUNTER — Other Ambulatory Visit (HOSPITAL_COMMUNITY): Payer: Self-pay

## 2023-08-15 ENCOUNTER — Ambulatory Visit (HOSPITAL_COMMUNITY): Payer: Self-pay | Admitting: Critical Care Medicine

## 2023-08-15 ENCOUNTER — Other Ambulatory Visit: Payer: Self-pay

## 2023-08-15 ENCOUNTER — Ambulatory Visit (HOSPITAL_COMMUNITY)
Admission: RE | Disposition: A | Discharge status: Home / Self Care | Payer: Self-pay | Source: Home / Self Care | Attending: Cardiology

## 2023-08-15 ENCOUNTER — Encounter (HOSPITAL_COMMUNITY): Payer: Self-pay | Admitting: Cardiology

## 2023-08-15 ENCOUNTER — Inpatient Hospital Stay (HOSPITAL_COMMUNITY): Admit: 2023-08-15

## 2023-08-15 ENCOUNTER — Ambulatory Visit (HOSPITAL_COMMUNITY)
Admission: RE | Admit: 2023-08-15 | Discharge: 2023-08-15 | Disposition: A | Discharge status: Home / Self Care | Payer: PPO | Attending: Cardiology | Admitting: Cardiology

## 2023-08-15 DIAGNOSIS — I4819 Other persistent atrial fibrillation: Secondary | ICD-10-CM | POA: Diagnosis not present

## 2023-08-15 DIAGNOSIS — I495 Sick sinus syndrome: Secondary | ICD-10-CM | POA: Diagnosis not present

## 2023-08-15 DIAGNOSIS — I4891 Unspecified atrial fibrillation: Secondary | ICD-10-CM

## 2023-08-15 DIAGNOSIS — Z79899 Other long term (current) drug therapy: Secondary | ICD-10-CM | POA: Diagnosis not present

## 2023-08-15 DIAGNOSIS — Z950 Presence of cardiac pacemaker: Secondary | ICD-10-CM | POA: Diagnosis not present

## 2023-08-15 DIAGNOSIS — I10 Essential (primary) hypertension: Secondary | ICD-10-CM | POA: Diagnosis not present

## 2023-08-15 DIAGNOSIS — G4733 Obstructive sleep apnea (adult) (pediatric): Secondary | ICD-10-CM | POA: Diagnosis not present

## 2023-08-15 DIAGNOSIS — J45909 Unspecified asthma, uncomplicated: Secondary | ICD-10-CM | POA: Diagnosis not present

## 2023-08-15 HISTORY — PX: ATRIAL FIBRILLATION ABLATION: EP1191

## 2023-08-15 LAB — POCT ACTIVATED CLOTTING TIME: Activated Clotting Time: 308 s

## 2023-08-15 SURGERY — ATRIAL FIBRILLATION ABLATION
Anesthesia: General

## 2023-08-15 MED ORDER — DEXAMETHASONE SODIUM PHOSPHATE 10 MG/ML IJ SOLN
INTRAMUSCULAR | Status: DC | PRN
  Administered 2023-08-15: 5 mg via INTRAVENOUS

## 2023-08-15 MED ORDER — ATROPINE SULFATE 1 MG/10ML IJ SOSY
PREFILLED_SYRINGE | INTRAMUSCULAR | Status: AC
  Filled 2023-08-15: qty 10

## 2023-08-15 MED ORDER — PHENYLEPHRINE HCL-NACL 20-0.9 MG/250ML-% IV SOLN
INTRAVENOUS | Status: DC | PRN
  Administered 2023-08-15: 30 ug/min via INTRAVENOUS

## 2023-08-15 MED ORDER — ROCURONIUM BROMIDE 10 MG/ML (PF) SYRINGE
PREFILLED_SYRINGE | INTRAVENOUS | Status: DC | PRN
  Administered 2023-08-15: 25 mg via INTRAVENOUS
  Administered 2023-08-15: 5 mg via INTRAVENOUS
  Administered 2023-08-15: 50 mg via INTRAVENOUS

## 2023-08-15 MED ORDER — SUGAMMADEX SODIUM 200 MG/2ML IV SOLN
INTRAVENOUS | Status: DC | PRN
  Administered 2023-08-15: 250 mg via INTRAVENOUS
  Administered 2023-08-15: 150 mg via INTRAVENOUS

## 2023-08-15 MED ORDER — SODIUM CHLORIDE 0.9 % IV SOLN: INTRAVENOUS | Status: DC

## 2023-08-15 MED ORDER — EPHEDRINE SULFATE-NACL 50-0.9 MG/10ML-% IV SOSY
PREFILLED_SYRINGE | INTRAVENOUS | Status: DC | PRN
  Administered 2023-08-15: 10 mg via INTRAVENOUS

## 2023-08-15 MED ORDER — PANTOPRAZOLE SODIUM 40 MG PO TBEC
40.0000 mg | DELAYED_RELEASE_TABLET | Freq: Every day | ORAL | 0 refills | Status: DC
  Filled 2023-08-15: qty 45, 45d supply, fill #0

## 2023-08-15 MED ORDER — ACETAMINOPHEN 325 MG PO TABS: 650.0000 mg | ORAL_TABLET | ORAL | Status: DC | PRN

## 2023-08-15 MED ORDER — FENTANYL CITRATE (PF) 250 MCG/5ML IJ SOLN
INTRAMUSCULAR | Status: DC | PRN
Start: 2023-08-15 — End: 2023-08-15
  Administered 2023-08-15: 100 ug via INTRAVENOUS

## 2023-08-15 MED ORDER — PROPOFOL 10 MG/ML IV BOLUS
INTRAVENOUS | Status: DC | PRN
  Administered 2023-08-15: 150 mg via INTRAVENOUS

## 2023-08-15 MED ORDER — SODIUM CHLORIDE 0.9 % IV SOLN: 250.0000 mL | INTRAVENOUS | Status: DC | PRN

## 2023-08-15 MED ORDER — COLCHICINE 0.6 MG PO TABS
0.6000 mg | ORAL_TABLET | Freq: Two times a day (BID) | ORAL | Status: DC
Start: 2023-08-15 — End: 2023-08-15
  Administered 2023-08-15: 0.6 mg via ORAL
  Filled 2023-08-15: qty 1

## 2023-08-15 MED ORDER — MIDAZOLAM HCL 5 MG/5ML IJ SOLN
INTRAMUSCULAR | Status: AC
  Filled 2023-08-15: qty 5

## 2023-08-15 MED ORDER — LIDOCAINE 2% (20 MG/ML) 5 ML SYRINGE
INTRAMUSCULAR | Status: DC | PRN
  Administered 2023-08-15: 60 mg via INTRAVENOUS

## 2023-08-15 MED ORDER — HEPARIN SODIUM (PORCINE) 1000 UNIT/ML IJ SOLN
INTRAMUSCULAR | Status: AC
  Filled 2023-08-15: qty 20

## 2023-08-15 MED ORDER — FENTANYL CITRATE (PF) 100 MCG/2ML IJ SOLN
INTRAMUSCULAR | Status: AC
Start: 2023-08-15 — End: ?
  Filled 2023-08-15: qty 2

## 2023-08-15 MED ORDER — CEFAZOLIN SODIUM-DEXTROSE 2-3 GM-%(50ML) IV SOLR
INTRAVENOUS | Status: DC | PRN
Start: 2023-08-15 — End: 2023-08-15
  Administered 2023-08-15: 2 g via INTRAVENOUS

## 2023-08-15 MED ORDER — SODIUM CHLORIDE 0.9% FLUSH: 3.0000 mL | Freq: Two times a day (BID) | INTRAVENOUS | Status: DC

## 2023-08-15 MED ORDER — ATROPINE SULFATE 1 MG/10ML IJ SOSY
PREFILLED_SYRINGE | INTRAMUSCULAR | Status: DC | PRN
  Administered 2023-08-15: .5 mg via INTRAVENOUS

## 2023-08-15 MED ORDER — HEPARIN SODIUM (PORCINE) 1000 UNIT/ML IJ SOLN
INTRAMUSCULAR | Status: DC | PRN
  Administered 2023-08-15: 19000 [IU] via INTRAVENOUS
  Administered 2023-08-15: 5000 [IU] via INTRAVENOUS

## 2023-08-15 MED ORDER — PANTOPRAZOLE SODIUM 40 MG PO TBEC
40.0000 mg | DELAYED_RELEASE_TABLET | Freq: Every day | ORAL | Status: DC
  Administered 2023-08-15: 40 mg via ORAL
  Filled 2023-08-15: qty 1

## 2023-08-15 MED ORDER — ONDANSETRON HCL 4 MG/2ML IJ SOLN
INTRAMUSCULAR | Status: DC | PRN
  Administered 2023-08-15: 4 mg via INTRAVENOUS

## 2023-08-15 MED ORDER — MIDAZOLAM HCL 2 MG/2ML IJ SOLN
INTRAMUSCULAR | Status: DC | PRN
  Administered 2023-08-15: 2 mg via INTRAVENOUS

## 2023-08-15 MED ORDER — APIXABAN 5 MG PO TABS
5.0000 mg | ORAL_TABLET | Freq: Two times a day (BID) | ORAL | Status: DC
  Administered 2023-08-15: 5 mg via ORAL
  Filled 2023-08-15: qty 1

## 2023-08-15 MED ORDER — PROTAMINE SULFATE 10 MG/ML IV SOLN
INTRAVENOUS | Status: DC | PRN
  Administered 2023-08-15: 35 mg via INTRAVENOUS

## 2023-08-15 MED ORDER — HEPARIN (PORCINE) IN NACL 1000-0.9 UT/500ML-% IV SOLN
INTRAVENOUS | Status: DC | PRN
  Administered 2023-08-15 (×3): 500 mL

## 2023-08-15 MED ORDER — ONDANSETRON HCL 4 MG/2ML IJ SOLN: 4.0000 mg | Freq: Four times a day (QID) | INTRAMUSCULAR | Status: DC | PRN

## 2023-08-15 MED ORDER — CEFAZOLIN SODIUM-DEXTROSE 2-4 GM/100ML-% IV SOLN
INTRAVENOUS | Status: AC
  Filled 2023-08-15: qty 100

## 2023-08-15 MED ORDER — COLCHICINE 0.6 MG PO TABS
0.6000 mg | ORAL_TABLET | Freq: Two times a day (BID) | ORAL | 0 refills | Status: DC
  Filled 2023-08-15: qty 10, 5d supply, fill #0

## 2023-08-15 MED ORDER — SODIUM CHLORIDE 0.9% FLUSH: 3.0000 mL | INTRAVENOUS | Status: DC | PRN

## 2023-08-15 MED ORDER — ATROPINE SULFATE 1 MG/10ML IJ SOSY
PREFILLED_SYRINGE | INTRAMUSCULAR | Status: AC
  Filled 2023-08-15: qty 20

## 2023-08-15 SURGICAL SUPPLY — 21 items
BAG SNAP BAND KOVER 36X36 (MISCELLANEOUS) IMPLANT
BLANKET WARM UNDERBOD FULL ACC (MISCELLANEOUS) ×1 IMPLANT
CABLE PFA RX CATH CONN (CABLE) IMPLANT
CATH FARAWAVE ABLATION 31 (CATHETERS) IMPLANT
CATH OCTARAY 2.0 F 3-3-3-3-3 (CATHETERS) IMPLANT
CATH SOUNDSTAR ECO 8FR (CATHETERS) IMPLANT
CATH WEBSTER BI DIR CS D-F CRV (CATHETERS) IMPLANT
CLOSURE PERCLOSE PROSTYLE (VASCULAR PRODUCTS) IMPLANT
COVER SWIFTLINK CONNECTOR (BAG) ×1 IMPLANT
DILATOR VESSEL 38 20CM 16FR (INTRODUCER) IMPLANT
GUIDEWIRE INQWIRE 1.5J.035X260 (WIRE) IMPLANT
INQWIRE 1.5J .035X260CM (WIRE) ×1 IMPLANT
KIT VERSACROSS CNCT FARADRIVE (KITS) IMPLANT
PACK EP LF (CUSTOM PROCEDURE TRAY) ×1 IMPLANT
PAD DEFIB RADIO PHYSIO CONN (PAD) ×1 IMPLANT
PATCH CARTO3 (PAD) IMPLANT
SHEATH FARADRIVE STEERABLE (SHEATH) IMPLANT
SHEATH PINNACLE 8F 10CM (SHEATH) IMPLANT
SHEATH PINNACLE 9F 10CM (SHEATH) IMPLANT
SHEATH PROBE COVER 6X72 (BAG) IMPLANT
WIRE HI TORQ VERSACORE-J 145CM (WIRE) IMPLANT

## 2023-08-15 NOTE — Interval H&P Note (Signed)
 History and Physical Interval Note:  08/15/2023 7:02 AM  Mary Mcfarland  has presented today for surgery, with the diagnosis of a-fib.  The various methods of treatment have been discussed with the patient and family. After consideration of risks, benefits and other options for treatment, the patient has consented to  Procedure(s): ATRIAL FIBRILLATION ABLATION (N/A) as a surgical intervention.  The patient's history has been reviewed, patient examined, no change in status, stable for surgery.  I have reviewed the patient's chart and labs.  Questions were answered to the patient's satisfaction.     Rain Wilhide T Muscab Brenneman

## 2023-08-15 NOTE — Anesthesia Preprocedure Evaluation (Signed)
 Anesthesia Evaluation  Patient identified by MRN, date of birth, ID band Patient awake    Reviewed: Allergy & Precautions, H&P , NPO status , Patient's Chart, lab work & pertinent test results  History of Anesthesia Complications (+) PONV, DIFFICULT AIRWAY and history of anesthetic complications  Airway Mallampati: II   Neck ROM: full    Dental   Pulmonary asthma , sleep apnea , former smoker   breath sounds clear to auscultation       Cardiovascular hypertension, + DOE  + dysrhythmias Atrial Fibrillation + pacemaker  Rhythm:regular Rate:Normal     Neuro/Psych  PSYCHIATRIC DISORDERS  Depression Bipolar Disorder      GI/Hepatic ,GERD  ,,  Endo/Other    Class 3 obesity  Renal/GU      Musculoskeletal  (+) Arthritis ,    Abdominal   Peds  Hematology   Anesthesia Other Findings   Reproductive/Obstetrics                             Anesthesia Physical Anesthesia Plan  ASA: 3  Anesthesia Plan: General   Post-op Pain Management:    Induction: Intravenous  PONV Risk Score and Plan: 4 or greater and Ondansetron, Dexamethasone, Midazolam and Treatment may vary due to age or medical condition  Airway Management Planned: Oral ETT and Video Laryngoscope Planned  Additional Equipment:   Intra-op Plan:   Post-operative Plan: Extubation in OR  Informed Consent: I have reviewed the patients History and Physical, chart, labs and discussed the procedure including the risks, benefits and alternatives for the proposed anesthesia with the patient or authorized representative who has indicated his/her understanding and acceptance.     Dental advisory given  Plan Discussed with: CRNA, Anesthesiologist and Surgeon  Anesthesia Plan Comments:        Anesthesia Quick Evaluation

## 2023-08-15 NOTE — Telephone Encounter (Signed)
 Pt c/o medication issue:  1. Name of Medication: zolpidem (AMBIEN) 10 MG tablet   2. How are you currently taking this medication (dosage and times per day)?   3. Are you having a reaction (difficulty breathing--STAT)?   4. What is your medication issue? Patient wants to know if she can take this medication tonight.  She states if she doesn't take it she can't sleep. She sent a Mychart message as well.

## 2023-08-15 NOTE — Anesthesia Procedure Notes (Signed)
 Procedure Name: Intubation Date/Time: 08/15/2023 7:51 AM  Performed by: Darlina Guys, CRNAPre-anesthesia Checklist: Patient identified, Emergency Drugs available, Suction available and Patient being monitored Patient Re-evaluated:Patient Re-evaluated prior to induction Oxygen Delivery Method: Circle System Utilized Preoxygenation: Pre-oxygenation with 100% oxygen Induction Type: IV induction Ventilation: Mask ventilation without difficulty and Oral airway inserted - appropriate to patient size Laryngoscope Size: Robertshaw and 3 Grade View: Grade II Tube type: Oral Tube size: 7.0 mm Number of attempts: 1 Airway Equipment and Method: Stylet Placement Confirmation: ETT inserted through vocal cords under direct vision, positive ETCO2 and breath sounds checked- equal and bilateral Secured at: 21 cm Tube secured with: Tape Dental Injury: Teeth and Oropharynx as per pre-operative assessment  Comments: Known difficult airway. G/S used. Dentition and oral mucosa as per preop.

## 2023-08-15 NOTE — Discharge Instructions (Signed)

## 2023-08-15 NOTE — Transfer of Care (Signed)
 Immediate Anesthesia Transfer of Care Note  Patient: Mary Mcfarland  Procedure(s) Performed: ATRIAL FIBRILLATION ABLATION  Patient Location: PACU and Cath Lab  Anesthesia Type:General  Level of Consciousness: awake and patient cooperative  Airway & Oxygen Therapy: Patient Spontanous Breathing and Patient connected to face mask oxygen  Post-op Assessment: Report given to RN and Post -op Vital signs reviewed and stable  Post vital signs: Reviewed and stable  Last Vitals:  Vitals Value Taken Time  BP 123/49 08/15/23 0948  Temp    Pulse 70 08/15/23 0949  Resp 19 08/15/23 0949  SpO2 97 % 08/15/23 0949  Vitals shown include unfiled device data.  Last Pain:  Vitals:   08/15/23 0552  TempSrc: Oral  PainSc: 0-No pain         Complications: There were no known notable events for this encounter.

## 2023-08-16 ENCOUNTER — Telehealth (HOSPITAL_COMMUNITY): Payer: Self-pay

## 2023-08-16 MED FILL — Fentanyl Citrate Preservative Free (PF) Inj 100 MCG/2ML: INTRAMUSCULAR | Qty: 2 | Status: AC

## 2023-08-16 MED FILL — Midazolam HCl Inj 5 MG/5ML (Base Equivalent): INTRAMUSCULAR | Qty: 2 | Status: AC

## 2023-08-16 NOTE — Telephone Encounter (Addendum)
 Spoke with patient to complete post procedure follow up call.  Patient reports no complications with groin sites. States she had a headache yesterday, but resolved with Tylenol.    Instructions reviewed with patient:  Remove large bandage at puncture site after 24 hours. It is normal to have bruising, tenderness and a pea or marble sized lump/knot at the groin site which can take up to three months to resolve.  Get help right away if you notice sudden swelling at the puncture site.  Check your puncture site every day for signs of infection: fever, redness, swelling, pus drainage, warmth, foul odor or excessive pain. If this occurs, please call the office at [phone], to speak with the nurse. Get help right away if your puncture site is bleeding and the bleeding does not stop after applying firm pressure to the area.  You may continue to have skipped beats/ atrial fibrillation during the first several months after your procedure.  It is very important not to miss any doses of your blood thinner Eliquis. Patient restarted taking this medication on yesterday, 08/15/23.   You will follow up with the Afib clinic on 09/12/23 and follow up with the APP on 11/20/23.   Patient verbalized understanding to all instructions provided.

## 2023-08-16 NOTE — Telephone Encounter (Signed)
 Attempted to reach patient to follow up with procedure completed on 08/15/23, no answer. Left VM for patient to return call.

## 2023-08-16 NOTE — Anesthesia Postprocedure Evaluation (Signed)
 Anesthesia Post Note  Patient: Mary Mcfarland  Procedure(s) Performed: ATRIAL FIBRILLATION ABLATION     Patient location during evaluation: PACU Anesthesia Type: General Level of consciousness: awake and alert Pain management: pain level controlled Vital Signs Assessment: post-procedure vital signs reviewed and stable Respiratory status: spontaneous breathing, nonlabored ventilation, respiratory function stable and patient connected to nasal cannula oxygen Cardiovascular status: blood pressure returned to baseline and stable Postop Assessment: no apparent nausea or vomiting Anesthetic complications: no   There were no known notable events for this encounter.  Last Vitals:  Vitals:   08/15/23 1230 08/15/23 1300  BP:  (!) 99/46  Pulse: 76 72  Resp: 18 14  Temp:    SpO2: 95% 94%    Last Pain:  Vitals:   08/15/23 1022  TempSrc: Temporal  PainSc: 0-No pain                 Melanye Hiraldo S

## 2023-09-06 ENCOUNTER — Other Ambulatory Visit (HOSPITAL_COMMUNITY): Payer: Self-pay

## 2023-09-06 ENCOUNTER — Ambulatory Visit (HOSPITAL_COMMUNITY): Payer: HMO | Admitting: Physician Assistant

## 2023-09-08 ENCOUNTER — Ambulatory Visit (HOSPITAL_COMMUNITY): Payer: PPO | Admitting: Internal Medicine

## 2023-09-11 DIAGNOSIS — M79671 Pain in right foot: Secondary | ICD-10-CM | POA: Diagnosis not present

## 2023-09-12 ENCOUNTER — Ambulatory Visit (HOSPITAL_COMMUNITY)
Admission: RE | Admit: 2023-09-12 | Discharge: 2023-09-12 | Disposition: A | Discharge status: Home / Self Care | Payer: Self-pay | Source: Ambulatory Visit | Attending: Physician Assistant | Admitting: Physician Assistant

## 2023-09-12 ENCOUNTER — Encounter (HOSPITAL_COMMUNITY): Payer: Self-pay | Admitting: Physician Assistant

## 2023-09-12 VITALS — BP 130/74 | HR 71 | Ht 66.0 in | Wt 258.8 lb

## 2023-09-12 DIAGNOSIS — I10 Essential (primary) hypertension: Secondary | ICD-10-CM | POA: Diagnosis not present

## 2023-09-12 DIAGNOSIS — I484 Atypical atrial flutter: Secondary | ICD-10-CM | POA: Diagnosis not present

## 2023-09-12 DIAGNOSIS — Z6841 Body Mass Index (BMI) 40.0 and over, adult: Secondary | ICD-10-CM | POA: Diagnosis not present

## 2023-09-12 DIAGNOSIS — E669 Obesity, unspecified: Secondary | ICD-10-CM | POA: Diagnosis not present

## 2023-09-12 DIAGNOSIS — G4733 Obstructive sleep apnea (adult) (pediatric): Secondary | ICD-10-CM | POA: Diagnosis not present

## 2023-09-12 DIAGNOSIS — Z79899 Other long term (current) drug therapy: Secondary | ICD-10-CM | POA: Diagnosis not present

## 2023-09-12 DIAGNOSIS — R001 Bradycardia, unspecified: Secondary | ICD-10-CM | POA: Diagnosis not present

## 2023-09-12 DIAGNOSIS — Z950 Presence of cardiac pacemaker: Secondary | ICD-10-CM | POA: Diagnosis not present

## 2023-09-12 DIAGNOSIS — F319 Bipolar disorder, unspecified: Secondary | ICD-10-CM | POA: Insufficient documentation

## 2023-09-12 DIAGNOSIS — Z7901 Long term (current) use of anticoagulants: Secondary | ICD-10-CM | POA: Diagnosis not present

## 2023-09-12 DIAGNOSIS — I4819 Other persistent atrial fibrillation: Secondary | ICD-10-CM | POA: Diagnosis not present

## 2023-09-12 DIAGNOSIS — D6869 Other thrombophilia: Secondary | ICD-10-CM | POA: Diagnosis not present

## 2023-09-12 DIAGNOSIS — Z5181 Encounter for therapeutic drug level monitoring: Secondary | ICD-10-CM

## 2023-09-12 NOTE — Progress Notes (Signed)
 Primary Care Physician: Daisy Floro, MD Primary Cardiologist: Dr Cristal Deer  Primary Electrophysiologist: Dr Lalla Brothers Referring Physician: Dr Chyrel Masson is a 68 y.o. female with a history of HTN, bipolar disorder, symptomatic bradycardia s/p PPM, and persistent atrial fibrillation who presents for follow up in the [facility]. She was first diagnosed with atrial fibrillation in 2017 and then recurrence found in January of this year at PCP visit. She went in for evaluation of headaches, dizziness.  She was placed on BB and Eliquis.  She underwent attempted cardioversion in February which failed to restore SR. Patient is on Eliquis for a CHADS2VASC score of 3. She was evaluated by Dr Johney Frame on 08/09/19 who recommended dofetilide. Patient is s/p dofetilide loading 3/16-3/19/21. She did not require DCCV. Patient was seen at her PCP office on 11/21/19 and found to be bradycardic HR 44 with PACs. She was also noted to have a 3 second pause on rhythm strip. Her BB was stopped. Patient reports that her SOB has improved off of metoprolol. Patient presented for DCCV on 12/06/19 and was found to be in SR, procedure was cancelled.   Patient returns for follow up for atrial fibrillation and dofetilide monitoring. Patient is s/p afib ablation with Dr Lalla Brothers on 08/15/23. Patient remains in SR today and feels well. She denies chest pain or groin issues. No bleeding issues on anticoagulation.   Today, she  denies symptoms of palpitations, chest pain, shortness of breath, orthopnea, PND, lower extremity edema, dizziness, presyncope, syncope, bleeding, or neurologic sequela. The patient is tolerating medications without difficulties and is otherwise without complaint today.    Atrial Fibrillation Risk Factors:  she does have symptoms or diagnosis of sleep apnea.  she does not have a history of rheumatic fever. she does not have a history of alcohol  use.   Atrial Fibrillation Management history:  Previous antiarrhythmic drugs: dofetilide Previous cardioversions: 07/24/19 Previous ablations: 08/15/23 Anticoagulation history: Eliquis   Past Medical History:  Diagnosis Date   Anticoagulant long-term use    eliquis--- managed by cardiology   Asthma, mild persistent    pulmonologist--- dr Irma Newness;  (04-08-2022  pt stated last rescue inhaler use 2 months ago)   Bipolar 2 disorder (HCC)    Cardiac pacemaker in situ 02/06/2020   for tachy-brady syndrome and symptomatic bradycardia;   [employer]  (ep cardiologist--- dr Johney Frame)   Cystocele with rectocele    Difficult intubation 03/2016   04-08-2022  pt stated was scheduled for achilles tendon repair @ [facility] prior to when was done @ [facility] on 10-03-207,  when she woke up was told by nurse the surgery was note done due to Narrow airway and did not have what was needed so surgery was rescheduled (no documentation of any type in epic about this incident) per anesthesia record from 03-15-2016 pt had no issues and pt stated no issues   Diverticulosis of colon    DOE (dyspnea on exertion)    pt stated due to aftrial fib, sob,  is able to do household chores but with long distance walking and stairs get sob   Edema of both lower extremities    GERD (gastroesophageal reflux disease)    History of adenomatous polyp of colon    History of diverticulitis of colon    Hyperlipidemia    Hypertension    Longstanding persistent atrial fibrillation [facility]) 2017   cardiologist-- dr c. Hughie Closs;  for dx 2017 the  reacurrance 01/ 2021;  s/p DCCV 07-24-2019;  admitted for tikosyn loading 03/ 2021;  sss s/p dual PPM 02-06-2020   MDD (major depressive disorder)    OA (osteoarthritis)    OSA (obstructive sleep apnea)    sleep study in epic 04-06-2021 OSA and nocturnal hypoxemia, per pt was unable to tolerate bipap recommended but does have appointment to get dental appliance   Pelvic prolapse    PONV  (postoperative nausea and vomiting)    Pre-diabetes    Sciatica of right side    SSS (sick sinus syndrome) (HCC)    Thyroid nodule    followed by pcp;  last ultrasound in epic 12-09-2011 pt stated never had bx done   Vitamin D deficiency    Wears glasses     ROS- All systems are reviewed and negative except as per the HPI above.  Physical Exam: Vitals:   09/12/23 1053  BP: 130/74  Pulse: 71  Weight: 117.4 kg  Height: 5\' 6"  (1.676 m)     GEN: Well nourished, well developed in no acute distress CARDIAC: Regular rate and rhythm, no murmurs, rubs, gallops RESPIRATORY:  Clear to auscultation without rales, wheezing or rhonchi  ABDOMEN: Soft, non-tender, non-distended EXTREMITIES:  No edema; No deformity    Wt Readings from Last 3 Encounters:  09/12/23 117.4 kg  08/15/23 117.9 kg  08/11/23 114.8 kg    EKG today demonstrates  AV dual paced rhythm Vent. rate 71 BPM PR interval 224 ms QRS duration 144 ms QT/QTcB 436/473 ms   Echo 07/01/19 demonstrated  1. Left ventricular ejection fraction, by visual estimation, is 60 to  65%. The left ventricle has normal function. There is mildly increased  left ventricular hypertrophy.   2. Left ventricular diastolic parameters are indeterminate.   3. The left ventricle has no regional wall motion abnormalities.   4. Global right ventricle has normal systolic function.The right  ventricular size is normal. No increase in right ventricular wall  thickness.   5. Left atrial size was moderately dilated.   6. Right atrial size was normal.   7. The mitral valve is normal in structure. Trivial mitral valve  regurgitation. No evidence of mitral stenosis.   8. The tricuspid valve is normal in structure.   9. The tricuspid valve is normal in structure. Tricuspid valve  regurgitation is not demonstrated.  10. The aortic valve is tricuspid. Aortic valve regurgitation is not  visualized. Mild to moderate aortic valve sclerosis/calcification  without  any evidence of aortic stenosis.  11. Calcified non coronary cusp.  12. The pulmonic valve was normal in structure. Pulmonic valve  regurgitation is not visualized.  13. The inferior vena cava is normal in size with greater than 50%  respiratory variability, suggesting right atrial pressure of 3 mmHg.   Epic records are reviewed at length today  CHA2DS2-VASc Score = 3  The patient's score is based upon: CHF History: 0 HTN History: 1 Diabetes History: 0 Stroke History: 0 Vascular Disease History: 0 Age Score: 1 Gender Score: 1       ASSESSMENT AND PLAN: Persistent Atrial Fibrillation/atypical atrial flutter The patient's CHA2DS2-VASc score is 3, indicating a 3.2% annual risk of stroke.   S/p afib ablation 08/15/23 Patient appears to be maintaining SR Continue dofetilide 250 mg BID Continue diltiazem 120 mg BID Continue Eliquis 5 mg BID  Secondary Hypercoagulable State (ICD10:  D68.69) The patient is at significant risk for stroke/thromboembolism based upon her CHA2DS2-VASc Score of 3.  Continue Apixaban (  Eliquis). No bleeding issues.   High Risk Medication Monitoring (ICD 10: Z79.899) QT interval on ECG acceptable for dofetilide monitoring.   Obesity Body mass index is 41.77 kg/m.  Encouraged lifestyle modification  OSA  Patient did not tolerate BiPap Encouraged compliance with oral appliance  HTN Stable on current regimen  Symptomatic bradycardia S/p PPM, followed by Dr Lalla Brothers   Follow up with Dr Lalla Brothers as scheduled.    Jorja Loa PA-C Afib Clinic [facility] [address] [place], [place] [address] [phone] 09/12/2023 11:19 AM

## 2023-09-12 NOTE — Addendum Note (Signed)
 Addended by: Elease Etienne A on: 09/12/2023 12:45 PM   Modules accepted: Orders

## 2023-09-12 NOTE — Progress Notes (Signed)
 Remote pacemaker transmission.

## 2023-09-20 ENCOUNTER — Encounter: Payer: Self-pay | Admitting: Cardiology

## 2023-09-20 DIAGNOSIS — I480 Paroxysmal atrial fibrillation: Secondary | ICD-10-CM

## 2023-09-20 MED ORDER — DOFETILIDE 250 MCG PO CAPS: 250.0000 ug | ORAL_CAPSULE | Freq: Two times a day (BID) | ORAL | 3 refills | Status: AC

## 2023-09-20 MED ORDER — APIXABAN 5 MG PO TABS: 5.0000 mg | ORAL_TABLET | Freq: Two times a day (BID) | ORAL | 3 refills | Status: AC

## 2023-09-22 ENCOUNTER — Ambulatory Visit
Admission: EM | Admit: 2023-09-22 | Discharge: 2023-09-22 | Disposition: A | Discharge status: Home / Self Care | Attending: Family Medicine | Admitting: Family Medicine

## 2023-09-22 DIAGNOSIS — J0100 Acute maxillary sinusitis, unspecified: Secondary | ICD-10-CM

## 2023-09-22 MED ORDER — AMOXICILLIN-POT CLAVULANATE 875-125 MG PO TABS: ORAL_TABLET | ORAL | 0 refills | Status: AC

## 2023-09-22 NOTE — ED Triage Notes (Signed)
 [facility] presents to [facility] with [facility] of dental pain, facial pain since Monday.  Pt reports she was seen at the dentist yesterday and was neg for any dental abnormality. Pt has taken tylenol and left over pain meds from previous surgery.

## 2023-09-22 NOTE — Discharge Instructions (Signed)
 Increase fluid intake.  Get adequate rest.   May use Afrin nasal spray (or generic oxymetazoline) each morning for about 5 days and then discontinue.  Also recommend using saline nasal spray several times daily and saline nasal irrigation (AYR is a common brand).  Use Flonase nasal spray each morning after using Afrin nasal spray and saline nasal irrigation. May take Tylenol if needed for pain.

## 2023-09-22 NOTE — ED Provider Notes (Signed)
 [name]    CSN: [id number] Arrival date & time: 09/22/23  4098      History   Chief Complaint Chief Complaint  Patient presents with   Facial Pain    HPI Mary Mcfarland is a 68 y.o. female.   Patient complains of bilateral facial pain for 4 days. She also had upper dental pain, having visited her dentist who found no evidence of dental infection.  She has seasonal rhinitis with an increase in nasal congestion recently.  She has had chills during the past two days.  The history is provided by the patient.    Past Medical History:  Diagnosis Date   Anticoagulant long-term use    eliquis--- managed by cardiology   Asthma, mild persistent    pulmonologist--- dr Christal Court;  (04-08-2022  pt stated last rescue inhaler use 2 months ago)   Bipolar 2 disorder (HCC)    Cardiac pacemaker in situ 02/06/2020   for tachy-brady syndrome and symptomatic bradycardia;   [employer]  (ep cardiologist--- dr Nunzio Belch)   Cystocele with rectocele    Difficult intubation 03/2016   04-08-2022  pt stated was scheduled for achilles tendon repair @ [facility] prior to when was done @ [facility] on 10-03-207,  when she woke up was told by nurse the surgery was note done due to Narrow airway and did not have what was needed so surgery was rescheduled (no documentation of any type in epic about this incident) per anesthesia record from 03-15-2016 pt had no issues and pt stated no issues   Diverticulosis of colon    DOE (dyspnea on exertion)    pt stated due to aftrial fib, sob,  is able to do household chores but with long distance walking and stairs get sob   Edema of both lower extremities    GERD (gastroesophageal reflux disease)    History of adenomatous polyp of colon    History of diverticulitis of colon    Hyperlipidemia    Hypertension    Longstanding persistent atrial fibrillation (HCC) 2017   cardiologist-- dr c. Yvette Hercules;  for dx 2017 the reacurrance 01/ 2021;  s/p DCCV  07-24-2019;  admitted for tikosyn loading 03/ 2021;  sss s/p dual PPM 02-06-2020   MDD (major depressive disorder)    OA (osteoarthritis)    OSA (obstructive sleep apnea)    sleep study in epic 04-06-2021 OSA and nocturnal hypoxemia, per pt was unable to tolerate bipap recommended but does have appointment to get dental appliance   Pelvic prolapse    PONV (postoperative nausea and vomiting)    Pre-diabetes    Sciatica of right side    SSS (sick sinus syndrome) (HCC)    Thyroid nodule    followed by pcp;  last ultrasound in epic 12-09-2011 pt stated never had bx done   Vitamin D deficiency    Wears glasses     Patient Active Problem List   Diagnosis Date Noted   Encounter for monitoring dofetilide therapy 09/12/2023   Hypercoagulable state due to persistent atrial fibrillation (HCC) 01/17/2023   Rectocele 04/14/2022   Aortic atherosclerosis (HCC) 01/24/2022   Mild persistent asthma without complication 10/14/2021   Sick sinus syndrome (HCC) 02/06/2020   DOE (dyspnea on exertion) 12/30/2019   Atypical atrial flutter (HCC) 11/25/2019   Obstructive sleep apnea 11/15/2019   Persistent atrial fibrillation (HCC) 08/27/2019   Paroxysmal atrial fibrillation (HCC) 06/21/2019   Essential hypertension 06/21/2019   Mixed hyperlipidemia 06/21/2019  Morbid obesity (HCC) 06/21/2019   Family history of heart disease 06/21/2019   Haglund's deformity of right heel 03/15/2016   Heel spur, right 03/15/2016    Past Surgical History:  Procedure Laterality Date   ACHILLES TENDON SURGERY Right 08/02/2022   Procedure: RIGHT HEEL PUMP BUMP EXCISION AND ACHILLES TENDON REPAIR;  Surgeon: Dayne Even, MD;  Location: [facility];  Service: Orthopedics;  Laterality: Right;   ANTERIOR AND POSTERIOR REPAIR N/A 04/14/2022   Procedure: POSTERIOR REPAIR (RECTOCELE);  Surgeon: Ona Bidding, MD;  Location: [facility];  Service: Gynecology;  Laterality: N/A;   ATRIAL FIBRILLATION ABLATION N/A  08/15/2023   Procedure: ATRIAL FIBRILLATION ABLATION;  Surgeon: Boyce Byes, MD;  [facility];  Service: Cardiovascular;  Laterality: N/A;   CARDIOVERSION N/A 07/24/2019   Procedure: CARDIOVERSION;  Surgeon: Sheryle Donning, MD;  Location: [facility] ENDOSCOPY;  Service: Cardiovascular;  Laterality: N/A;   COLONOSCOPY WITH PROPOFOL  11/2020   dr Feliberto Hopping   HEEL SPUR RESECTION Left 03/15/2016   Procedure: ACHILLIES TENDON REPAIR AND PUMP BUMP EXCISION;  Surgeon: Dayne Even, MD;  [facility];  Service: Orthopedics;  Laterality: Left;   PACEMAKER IMPLANT N/A 02/06/2020   Procedure: PACEMAKER IMPLANT;  Surgeon: Jolly Needle, MD;  [facility];  Service: Cardiovascular;  Laterality: N/A;   PERINEOPLASTY N/A 04/14/2022   Procedure: PERINEOPLASTY;  Surgeon: Ona Bidding, MD;  Location: [facility];  Service: Gynecology;  Laterality: N/A;   SHOULDER ARTHROSCOPY Right 11/15/2005   @[facility]  by dr Ana Balling   TOE SURGERY Right 2011   bone spur   TONSILLECTOMY  1979    OB History     Gravida  6   Para  2   Term      Preterm      AB      Living         SAB      IAB      Ectopic      Multiple      Live Births               Home Medications    Prior to Admission medications   Medication Sig Start Date End Date Taking? Authorizing Provider  amoxicillin-clavulanate (AUGMENTIN) 875-125 MG tablet Take one tab PO Q12hr with food 09/22/23  Yes Aniyla Harling, Lenis Quin, MD  albuterol (VENTOLIN HFA) 108 (90 Base) MCG/ACT inhaler Inhale 2 puffs into the lungs every 6 (six) hours as needed for wheezing or shortness of breath. 12/21/22   Quillian Brunt, MD  apixaban (ELIQUIS) 5 MG TABS tablet Take 1 tablet (5 mg total) by mouth 2 (two) times daily. 09/20/23   Sheryle Donning, MD  atorvastatin (LIPITOR) 40 MG tablet Take 1 tablet (40 mg total) by mouth daily. 01/31/23   Sheryle Donning, MD  diltiazem (DILT-XR) 120 MG 24 hr  capsule Take 1 capsule (120 mg total) by mouth 2 (two) times daily. 03/15/23   Fenton, Clint R, PA  dofetilide (TIKOSYN) 250 MCG capsule Take 1 capsule (250 mcg total) by mouth 2 (two) times daily. 09/20/23   Debbie Fails, PA-C  furosemide (LASIX) 40 MG tablet Take 2 tablets (80 mg total) by mouth daily. 07/24/23   Sheryle Donning, MD  lamoTRIgine (LAMICTAL) 100 MG tablet Take 1 tablet (100 mg total) by mouth daily. 08/08/23   Wray Heady, MD  lisinopril (ZESTRIL) 10 MG tablet Take 1 tablet (10 mg total) by mouth daily. 08/26/22  pantoprazole (PROTONIX) 40 MG tablet Take 1 tablet (40 mg total) by mouth daily. 08/15/23 09/29/23  Ursuy, Renee Lynn, PA-C  potassium chloride (KLOR-CON) 10 MEQ tablet Take 1 tablet (10 mEq total) by mouth daily. 06/26/23 09/25/23  Fenton, Clint R, PA  valACYclovir (VALTREX) 1000 MG tablet Take 1 tablet (1,000 mg total) by mouth 3 (three) times daily. Patient taking differently: Take 1,000 mg by mouth 3 (three) times daily as needed (shingles). 07/28/20   Farris Hong, PA-C  Wheat Dextrin (BENEFIBER) POWD Take 1 Dose by mouth daily. 1 dose = 1 tablespoon    [provider]  zolpidem (AMBIEN) 10 MG tablet Take 0.5-1 tablets (5-10 mg total) by mouth at bedtime as needed. Patient taking differently: Take 10 mg by mouth at bedtime. 05/29/23       Family History Family History  Problem Relation Age of Onset   Heart disease Mother    CVA Mother    Hypertension Mother    Cancer Mother    Stroke Mother    Depression Mother    Bipolar disorder Mother    Obesity Mother    Heart disease Father    Hypertension Father    Heart attack Father    Diabetes Father    Liver disease Father    Alcoholism Father    Drug abuse Father    Obesity Father     Social History Social History   Tobacco Use   Smoking status: Former    Current packs/day: 0.00    Average packs/day: 1 pack/day for 22.0 years (22.0 ttl pk-yrs)    Types: Cigarettes    Start date:  02/12/1974    Quit date: 02/13/1996    Years since quitting: 27.6    Passive exposure: Past   Smokeless tobacco: Never   Tobacco comments:    Former smoker 05/04/23  Vaping Use   Vaping status: Never Used  Substance Use Topics   Alcohol use: Not Currently    Comment: occassionally   Drug use: Never     Allergies   Niaspan [niacin er (antihyperlipidemic)]   Review of Systems Review of Systems No sore throat No cough No pleuritic pain No wheezing + nasal congestion ? post-nasal drainage + sinus pain/pressure No itchy/red eyes No earache No hemoptysis No SOB No fever/+ chills No nausea No vomiting No abdominal pain No diarrhea No urinary symptoms No skin rash No fatigue No myalgias + headache   Physical Exam Triage Vital Signs ED Triage Vitals  Encounter Vitals Group     BP 09/22/23 0857 116/85     Systolic BP Percentile --      Diastolic BP Percentile --      Pulse Rate 09/22/23 0857 86     Resp 09/22/23 0857 (!) 22     Temp 09/22/23 0857 100.1 F (37.8 C)     Temp src --      SpO2 09/22/23 0857 98 %     Weight --      Height --      Head Circumference --      Peak Flow --      Pain Score 09/22/23 0855 3     Pain Loc --      Pain Education --      Exclude from Growth Chart --    No data found.  Updated Vital Signs BP 116/85   Pulse 86   Temp 100.1 F (37.8 C)   Resp (!) 22   LMP 09/24/2013  SpO2 98%   Visual Acuity Right Eye Distance:   Left Eye Distance:   Bilateral Distance:    Right Eye Near:   Left Eye Near:    Bilateral Near:     Physical Exam Nursing notes and Vital Signs reviewed. Appearance:  Patient appears stated age, and in no acute distress Eyes:  Pupils are equal, round, and reactive to light and accomodation.  Extraocular movement is intact.  Conjunctivae are not inflamed  Ears:  Canals normal.  Tympanic membranes normal.  Nose:  Congested turbinates.  Maxillary sinus tenderness is present.  Pharynx:  Normal Neck:   Supple. No adenopathy.  Lungs:  Clear to auscultation.  Breath sounds are equal.  Moving air well. Heart:  Regular rate and rhythm without murmurs, rubs, or gallops.  Abdomen:  Nontender without masses or hepatosplenomegaly.  Bowel sounds are present.  No CVA or flank tenderness.  Extremities:  No edema.  Skin:  No rash present.   UC Treatments / Results  Labs (all labs ordered are listed, but only abnormal results are displayed) Labs Reviewed - No data to display  EKG   Radiology No results found.  Procedures Procedures (including critical care time)  Medications Ordered in UC Medications - No data to display  Initial Impression / Assessment and Plan / UC Course  I have reviewed the triage vital signs and the nursing notes.  Pertinent labs & imaging results that were available during my care of the patient were reviewed by me and considered in my medical decision making (see chart for details).    Begin Augmentin 875 Q12hr for one week. Followup with Family Doctor if not improved in one week.   Final Clinical Impressions(s) / UC Diagnoses   Final diagnoses:  Acute maxillary sinusitis, recurrence not specified     Discharge Instructions      Increase fluid intake.  Get adequate rest.   May use Afrin nasal spray (or generic oxymetazoline) each morning for about 5 days and then discontinue.  Also recommend using saline nasal spray several times daily and saline nasal irrigation (AYR is a common brand).  Use Flonase nasal spray each morning after using Afrin nasal spray and saline nasal irrigation. May take Tylenol if needed for pain.    ED Prescriptions     Medication Sig Dispense Auth. Provider   amoxicillin-clavulanate (AUGMENTIN) 875-125 MG tablet Take one tab PO Q12hr with food 14 tablet Leon Rajas, MD         Leon Rajas, MD 09/23/23 1949

## 2023-09-26 ENCOUNTER — Other Ambulatory Visit: Payer: HMO

## 2023-10-09 ENCOUNTER — Telehealth (INDEPENDENT_AMBULATORY_CARE_PROVIDER_SITE_OTHER): Payer: HMO | Admitting: Psychiatry

## 2023-10-09 ENCOUNTER — Encounter (HOSPITAL_COMMUNITY): Payer: Self-pay | Admitting: Psychiatry

## 2023-10-09 DIAGNOSIS — F5102 Adjustment insomnia: Secondary | ICD-10-CM | POA: Diagnosis not present

## 2023-10-09 DIAGNOSIS — F331 Major depressive disorder, recurrent, moderate: Secondary | ICD-10-CM

## 2023-10-09 DIAGNOSIS — M79671 Pain in right foot: Secondary | ICD-10-CM | POA: Diagnosis not present

## 2023-10-09 DIAGNOSIS — F3181 Bipolar II disorder: Secondary | ICD-10-CM | POA: Diagnosis not present

## 2023-10-09 DIAGNOSIS — M7661 Achilles tendinitis, right leg: Secondary | ICD-10-CM | POA: Diagnosis not present

## 2023-10-09 MED ORDER — LAMOTRIGINE 100 MG PO TABS: 100.0000 mg | ORAL_TABLET | Freq: Every day | ORAL | 0 refills | Status: DC

## 2023-10-09 NOTE — Progress Notes (Signed)
 [facility] Follow up visit   Patient Identification: Mary Mcfarland MRN:  [id number] Date of Evaluation:  10/09/2023 Referral Source: primary care Chief Complaint:  depression follow up , moodiness Visit Diagnosis:    ICD-10-CM   1. Bipolar 2 disorder (HCC)  F31.81     2. Adjustment insomnia  F51.02     3. MDD (major depressive disorder), recurrent episode, moderate (HCC)  F33.1     Virtual Visit via Video Note  I connected with Mary Mcfarland on 10/09/23 at  9:40 AM EDT by a video enabled telemedicine application and verified that I am speaking with the correct person using two identifiers.  Location: Patient: home Provider: home office   I discussed the limitations of evaluation and management by telemedicine and the availability of in person appointments. The patient expressed understanding and agreed to proceed.     I discussed the assessment and treatment plan with the patient. The patient was provided an opportunity to ask questions and all were answered. The patient agreed with the plan and demonstrated an understanding of the instructions.   The patient was advised to call back or seek an in-person evaluation if the symptoms worsen or if the condition fails to improve as anticipated.  I provided 15 minutes of non-face-to-face time during this encounter.         History of Present Illness: Patient is a 68 years old currently single Caucasian female initially referred by primary care physician and cardiology for management and establishment of depression    Follows with providers for medical conditions, AFib   On eval doing baseline, managing stress and mood is fair on lamictal . No rash  Daughter is a good support   Sleep fair with ambien     Aggravating factors; health issues Modifying factors; grand kids Severity; fair and manageable   Patient denies drug use or alcohol use     Past Psychiatric History: Depression, possible  bipolar  Previous Psychotropic Medications: Yes     Past Medical History:  Past Medical History:  Diagnosis Date   Anticoagulant long-term use    eliquis --- managed by cardiology   Asthma, mild persistent    pulmonologist--- Mary Mcfarland;  (04-08-2022  pt stated last rescue inhaler use 2 months ago)   Bipolar 2 disorder (HCC)    Cardiac pacemaker in situ 02/06/2020   for tachy-brady syndrome and symptomatic bradycardia;   [employer]  (ep cardiologist--- Mary Mcfarland)   Cystocele with rectocele    Difficult intubation 03/2016   04-08-2022  pt stated was scheduled for achilles tendon repair @ [facility] prior to when was done @ [facility] on 10-03-207,  when she woke up was told by nurse the surgery was note done due to Narrow airway and did not have what was needed so surgery was rescheduled (no documentation of any type in epic about this incident) per anesthesia record from 03-15-2016 pt had no issues and pt stated no issues   Diverticulosis of colon    DOE (dyspnea on exertion)    pt stated due to aftrial fib, sob,  is able to do household chores but with long distance walking and stairs get sob   Edema of both lower extremities    GERD (gastroesophageal reflux disease)    History of adenomatous polyp of colon    History of diverticulitis of colon    Hyperlipidemia    Hypertension    Longstanding persistent atrial fibrillation Mary Mcfarland) 2017   cardiologist-- Mary Mary Mcfarland;  for dx 2017 the reacurrance 01/ 2021;  s/p DCCV 07-24-2019;  admitted for tikosyn  loading 03/ 2021;  sss s/p dual PPM 02-06-2020   MDD (major depressive disorder)    OA (osteoarthritis)    OSA (obstructive sleep apnea)    sleep study in epic 04-06-2021 OSA and nocturnal hypoxemia, per pt was unable to tolerate bipap recommended but does have appointment to get dental appliance   Pelvic prolapse    PONV (postoperative nausea and vomiting)    Pre-diabetes    Sciatica of right side    SSS (sick sinus syndrome) (HCC)     Thyroid  nodule    followed by pcp;  last ultrasound in epic 12-09-2011 pt stated never had bx done   Vitamin D deficiency    Wears glasses     Past Surgical History:  Procedure Laterality Date   ACHILLES TENDON SURGERY Right 08/02/2022   Procedure: RIGHT HEEL PUMP BUMP EXCISION AND ACHILLES TENDON REPAIR;  Surgeon: Mary Even, MD;  Location: [facility];  Service: Orthopedics;  Laterality: Right;   ANTERIOR AND POSTERIOR REPAIR N/A 04/14/2022   Procedure: POSTERIOR REPAIR (RECTOCELE);  Surgeon: Mary Bidding, MD;  Location: [facility];  Service: Gynecology;  Laterality: N/A;   ATRIAL FIBRILLATION ABLATION N/A 08/15/2023   Procedure: ATRIAL FIBRILLATION ABLATION;  Surgeon: Mary Byes, MD;  [facility];  Service: Cardiovascular;  Laterality: N/A;   CARDIOVERSION N/A 07/24/2019   Procedure: CARDIOVERSION;  Surgeon: Mary Donning, MD;  Location: [facility] ENDOSCOPY;  Service: Cardiovascular;  Laterality: N/A;   COLONOSCOPY WITH PROPOFOL   11/2020   Mary Mary Mcfarland   HEEL SPUR RESECTION Left 03/15/2016   Procedure: ACHILLIES TENDON REPAIR AND PUMP BUMP EXCISION;  Surgeon: Mary Even, MD;  Location[facility];  Service: Orthopedics;  Laterality: Left;   PACEMAKER IMPLANT N/A 02/06/2020   Procedure: PACEMAKER IMPLANT;  Surgeon: Mary Needle, MD;  [facility];  Service: Cardiovascular;  Laterality: N/A;   PERINEOPLASTY N/A 04/14/2022   Procedure: PERINEOPLASTY;  Surgeon: Mary Bidding, MD;  Location: [facility];  Service: Gynecology;  Laterality: N/A;   SHOULDER ARTHROSCOPY Right 11/15/2005   @[facility]  by Mary Mary Mcfarland   TOE SURGERY Right 2011   bone spur   TONSILLECTOMY  1979    Family Psychiatric History: Mom diagnosed with possible bipolar  Family History:  Family History  Problem Relation Age of Onset   Heart disease Mother    CVA Mother    Hypertension Mother    Cancer Mother    Stroke Mother    Depression Mother     Bipolar disorder Mother    Obesity Mother    Heart disease Father    Hypertension Father    Heart attack Father    Diabetes Father    Liver disease Father    Alcoholism Father    Drug abuse Father    Obesity Father     Social History:   Social History   Socioeconomic History   Marital status: Divorced    Spouse name: Not on file   Number of children: Not on file   Years of education: Not on file   Highest education level: Not on file  Occupational History   Occupation: retired  Tobacco Use   Smoking status: Former    Current packs/day: 0.00    Average packs/day: 1 pack/day for 22.0 years (22.0 ttl pk-yrs)    Types: Cigarettes    Start date: 02/12/1974    Quit date:  02/13/1996    Years since quitting: 27.6    Passive exposure: Past   Smokeless tobacco: Never   Tobacco comments:    Former smoker 05/04/23  Vaping Use   Vaping status: Never Used  Substance and Sexual Activity   Alcohol use: Not Currently    Comment: occassionally   Drug use: Never   Sexual activity: Not Currently  Other Topics Concern   Not on file  Social History Narrative   Lives in [place] alone   Work at [employer]   Social Drivers of Corporate investment banker Strain: Not on file  Food Insecurity: Not on file  Transportation Needs: Not on file  Physical Activity: Not on file  Stress: Not on file  Social Connections: Not on file     Allergies:   Allergies  Allergen Reactions   Niaspan [Niacin Er (Antihyperlipidemic)] Hives    Metabolic Disorder Labs: Lab Results  Component Value Date   HGBA1C 6.0 (H) 07/20/2022   MPG 125.5 07/20/2022   No results found for: "PROLACTIN" Lab Results  Component Value Date   CHOL 168 01/14/2020   TRIG 78 01/14/2020   HDL 61 01/14/2020   CHOLHDL 3.1 11/13/2019   LDLCALC 92 01/14/2020   LDLCALC 101 (H) 11/13/2019   Lab Results  Component Value Date   TSH 3.610 01/14/2020    Therapeutic Level Labs: No results found for:  "LITHIUM" No results found for: "CBMZ" No results found for: "VALPROATE"  Current Medications: See chart    Psychiatric Specialty Exam: Review of Systems  Cardiovascular:  Negative for chest pain.  Skin:  Negative for rash.  Psychiatric/Behavioral:  Negative for hallucinations.     Last menstrual period 09/24/2013.There is no height or weight on file to calculate BMI.  General Appearance: Casual  Eye Contact:  Fair  Speech:  Normal Rate  Volume:  Normal  Mood: fair  Affect:  Congruent  Thought Process:  Goal Directed  Orientation:  Full (Time, Place, and Person)  Thought Content:  Rumination  Suicidal Thoughts:  No  Homicidal Thoughts:  No  Memory:  Immediate;   Fair Recent;   Fair  Judgement:  Fair  Insight:  Shallow  Psychomotor Activity:  decreased  Concentration:  Concentration: Fair and Attention Span: Fair  Recall:  [employer] of Knowledge:Fair  Language: Good  Akathisia:  No  Handed:   AIMS (if indicated):  not done  Assets:  Desire for Improvement Housing Social Support  ADL's:  Intact  Cognition: WNL  Sleep:   variable   Screenings: Insurance account manager from 04/05/2022 in [facility] Office Visit from 01/14/2020 in [facility] [place] Total Score 2 6  PHQ-9 Total Score 8 14      Flowsheet Row ED from 09/22/2023 in [facility] Admission (Discharged) from 08/15/2023 in [facility] CARDIAC CATH LAB Admission (Discharged) from 08/11/2023 in [facility] CARDIAC CATH LAB  C-SSRS RISK CATEGORY No Risk No Risk No Risk       Assessment and Plan: as follows  Prior documentation reviewed    Bipolar 2 ; depressed ; stable continue lamictal  Depression; improved and keeping active, has family support continue lamictal .   Insomnia: manageable continue sleep hygiene also is on  ambien   Reviewed meds, lamictal  sent  FU 6 m or earlier if needed Wray Heady, MD 4/28/20259:41  AM

## 2023-10-12 ENCOUNTER — Other Ambulatory Visit (HOSPITAL_COMMUNITY): Payer: Self-pay | Admitting: Orthopaedic Surgery

## 2023-10-12 DIAGNOSIS — M25571 Pain in right ankle and joints of right foot: Secondary | ICD-10-CM

## 2023-10-13 ENCOUNTER — Encounter: Payer: Self-pay | Admitting: Cardiology

## 2023-10-16 MED ORDER — POTASSIUM CHLORIDE ER 10 MEQ PO TBCR: 10.0000 meq | EXTENDED_RELEASE_TABLET | Freq: Every day | ORAL | 1 refills | Status: DC

## 2023-10-31 ENCOUNTER — Encounter: Payer: Self-pay | Admitting: Cardiology

## 2023-11-03 ENCOUNTER — Ambulatory Visit: Payer: PPO | Admitting: Physician Assistant

## 2023-11-08 ENCOUNTER — Ambulatory Visit: Payer: PPO

## 2023-11-08 DIAGNOSIS — I495 Sick sinus syndrome: Secondary | ICD-10-CM | POA: Diagnosis not present

## 2023-11-08 NOTE — CV Procedure (Signed)
  Device system confirmed to be MRI conditional, with implant date > 6 weeks ago, and no evidence of abandoned or epicardial leads in review of most recent CXR  Device last cleared by EP Provider: Mertha Abrahams 11/08/23  Clearance is good through for 1 year as long as parameters remain stable at time of check. If pt undergoes a cardiac device procedure during that time, they should be re-cleared.   Tachy-therapies to be programmed off if applicable with device back to pre-MRI settings after completion of exam.  [facility] will be present for programming for the MRI.   Arlys Lamer, RT  11/08/2023 1:32 PM

## 2023-11-09 LAB — CUP PACEART REMOTE DEVICE CHECK
Battery Remaining Longevity: 58 mo
Battery Remaining Percentage: 62 %
Battery Voltage: 2.99 V
Brady Statistic AP VP Percent: 77 %
Brady Statistic AP VS Percent: 11 %
Brady Statistic AS VP Percent: 5.6 %
Brady Statistic AS VS Percent: 5.8 %
Brady Statistic RA Percent Paced: 88 %
Brady Statistic RV Percent Paced: 83 %
Date Time Interrogation Session: 20250528034239
Implantable Lead Connection Status: 753985
Implantable Lead Connection Status: 753985
Implantable Lead Implant Date: 20210826
Implantable Lead Implant Date: 20210826
Implantable Lead Location: 753859
Implantable Lead Location: 753860
Implantable Pulse Generator Implant Date: 20210826
Lead Channel Impedance Value: 450 Ohm
Lead Channel Impedance Value: 510 Ohm
Lead Channel Pacing Threshold Amplitude: 1 V
Lead Channel Pacing Threshold Amplitude: 1.25 V
Lead Channel Pacing Threshold Pulse Width: 0.4 ms
Lead Channel Pacing Threshold Pulse Width: 0.5 ms
Lead Channel Sensing Intrinsic Amplitude: 2.6 mV
Lead Channel Sensing Intrinsic Amplitude: 3.1 mV
Lead Channel Setting Pacing Amplitude: 2.5 V
Lead Channel Setting Pacing Amplitude: 2.5 V
Lead Channel Setting Pacing Pulse Width: 0.4 ms
Lead Channel Setting Sensing Sensitivity: 2 mV
Pulse Gen Model: 2272
Pulse Gen Serial Number: 3859348

## 2023-11-13 ENCOUNTER — Other Ambulatory Visit (HOSPITAL_COMMUNITY): Payer: Self-pay | Admitting: Physician Assistant

## 2023-11-13 ENCOUNTER — Ambulatory Visit: Payer: Self-pay | Admitting: Cardiology

## 2023-11-13 ENCOUNTER — Ambulatory Visit (HOSPITAL_COMMUNITY)
Admission: RE | Admit: 2023-11-13 | Discharge: 2023-11-13 | Disposition: A | Discharge status: Home / Self Care | Source: Ambulatory Visit | Attending: Orthopaedic Surgery | Admitting: Orthopaedic Surgery

## 2023-11-13 DIAGNOSIS — I480 Paroxysmal atrial fibrillation: Secondary | ICD-10-CM

## 2023-11-13 DIAGNOSIS — M25571 Pain in right ankle and joints of right foot: Secondary | ICD-10-CM | POA: Diagnosis not present

## 2023-11-13 DIAGNOSIS — R609 Edema, unspecified: Secondary | ICD-10-CM | POA: Diagnosis not present

## 2023-11-13 DIAGNOSIS — M19071 Primary osteoarthritis, right ankle and foot: Secondary | ICD-10-CM | POA: Diagnosis not present

## 2023-11-20 ENCOUNTER — Ambulatory Visit: Payer: PPO | Admitting: Physician Assistant

## 2023-11-22 DIAGNOSIS — M7661 Achilles tendinitis, right leg: Secondary | ICD-10-CM | POA: Diagnosis not present

## 2023-12-07 NOTE — Progress Notes (Signed)
  Electrophysiology Office Follow up Visit Note:    Date:  12/08/2023   ID:  Mary Mcfarland, DOB 07/27/1955, MRN [id number]  PCP:  Okey Carlin Redbird, MD  [facility] Cardiologist:  Shelda Bruckner, MD  [facility] Electrophysiologist:  OLE ONEIDA HOLTS, MD    Interval History:     Mary Mcfarland is a 68 y.o. female who presents for a follow up visit.   The patient had a catheter ablation for atrial fibrillation on August 15, 2023 during which the pulmonary veins and posterior wall were isolated.  Device interrogation after the ablation showed no sustained arrhythmias.  She saw a [place] on September 12, 2023.  At that appointment she was maintaining sinus rhythm.  She takes Tikosyn  and Eliquis .      Past medical, surgical, social and family history were reviewed.  ROS:   Please see the history of present illness.    All other systems reviewed and are negative.  EKGs/Labs/Other Studies Reviewed:    The following studies were reviewed today:          Physical Exam:    VS:  BP 120/82   Pulse 70   Ht 5' 6 (1.676 m)   Wt 250 lb (113.4 kg)   LMP 09/24/2013   SpO2 97%   BMI 40.35 kg/m     Wt Readings from Last 3 Encounters:  12/08/23 250 lb (113.4 kg)  09/12/23 258 lb 12.8 oz (117.4 kg)  08/15/23 260 lb (117.9 kg)     GEN: no distress CARD: RRR, No MRG RESP: No IWOB. CTAB.      ASSESSMENT:    1. Persistent atrial fibrillation (HCC)   2. Encounter for long-term (current) use of high-risk medication    PLAN:    In order of problems listed above:  #Persistent atrial fibrillation #High risk med use-dofetilide  Doing well after her catheter ablation.  Continue Tikosyn  and Eliquis .  QTc acceptable for ongoing Tikosyn  use on today's EKG..  QTc 471 when corrected for wide QRS  Update BMP and magnesium  today.  Follow-up 4 months with EP APP  Signed, OLE HOLTS, MD, [facility], [facility] 12/08/2023 10:42 AM    Electrophysiology [facility]

## 2023-12-08 ENCOUNTER — Ambulatory Visit

## 2023-12-08 ENCOUNTER — Other Ambulatory Visit: Payer: Self-pay

## 2023-12-08 ENCOUNTER — Encounter: Payer: Self-pay | Admitting: Cardiology

## 2023-12-08 ENCOUNTER — Ambulatory Visit: Payer: Self-pay | Attending: Cardiology | Admitting: Cardiology

## 2023-12-08 VITALS — BP 120/82 | HR 70 | Ht 66.0 in | Wt 250.0 lb

## 2023-12-08 DIAGNOSIS — I4819 Other persistent atrial fibrillation: Secondary | ICD-10-CM

## 2023-12-08 DIAGNOSIS — Z79899 Other long term (current) drug therapy: Secondary | ICD-10-CM

## 2023-12-08 DIAGNOSIS — Z780 Asymptomatic menopausal state: Secondary | ICD-10-CM | POA: Diagnosis not present

## 2023-12-08 DIAGNOSIS — M85852 Other specified disorders of bone density and structure, left thigh: Secondary | ICD-10-CM | POA: Diagnosis not present

## 2023-12-08 DIAGNOSIS — E2839 Other primary ovarian failure: Secondary | ICD-10-CM | POA: Diagnosis not present

## 2023-12-08 NOTE — Patient Instructions (Signed)
 Medication Instructions:  Your physician recommends that you continue on your current medications as directed. Please refer to the Current Medication list given to you today.  *If you need a refill on your cardiac medications before your next appointment, please call your pharmacy*  Lab Work: Today: BMET and Mag   Follow-Up: At [facility], you and your health needs are our priority.  As part of our continuing mission to provide you with exceptional heart care, our providers are all part of one team.  This team includes your primary Cardiologist (physician) and Advanced Practice Providers or APPs (Physician Assistants and Nurse Practitioners) who all work together to provide you with the care you need, when you need it.  Your next appointment:   4 months  Provider:   You will see one of the following Advanced Practice Providers on your designated Care Team:   Charlies Arthur, PA-C Michael Andy Tillery, PA-C Suzann Riddle, NP Daphne Barrack, NP

## 2023-12-09 LAB — BASIC METABOLIC PANEL WITH GFR
BUN/Creatinine Ratio: 22 (ref 12–28)
BUN: 19 mg/dL (ref 8–27)
CO2: 22 mmol/L (ref 20–29)
Calcium: 9.8 mg/dL (ref 8.7–10.3)
Chloride: 103 mmol/L (ref 96–106)
Creatinine, Ser: 0.85 mg/dL (ref 0.57–1.00)
Glucose: 105 mg/dL — AB (ref 70–99)
Potassium: 4.4 mmol/L (ref 3.5–5.2)
Sodium: 142 mmol/L (ref 134–144)
eGFR: 76 mL/min/{1.73_m2} (ref >59)

## 2023-12-09 LAB — MAGNESIUM: Magnesium: 2 mg/dL (ref 1.6–2.3)

## 2023-12-11 ENCOUNTER — Ambulatory Visit: Payer: Self-pay

## 2023-12-18 ENCOUNTER — Encounter: Payer: Self-pay | Admitting: Cardiology

## 2023-12-19 ENCOUNTER — Telehealth: Payer: Self-pay | Admitting: *Deleted

## 2023-12-19 DIAGNOSIS — K5792 Diverticulitis of intestine, part unspecified, without perforation or abscess without bleeding: Secondary | ICD-10-CM | POA: Diagnosis not present

## 2023-12-19 DIAGNOSIS — Z950 Presence of cardiac pacemaker: Secondary | ICD-10-CM | POA: Diagnosis not present

## 2023-12-19 DIAGNOSIS — I4891 Unspecified atrial fibrillation: Secondary | ICD-10-CM | POA: Diagnosis not present

## 2023-12-19 DIAGNOSIS — Z7901 Long term (current) use of anticoagulants: Secondary | ICD-10-CM | POA: Diagnosis not present

## 2023-12-19 DIAGNOSIS — Z86018 Personal history of other benign neoplasm: Secondary | ICD-10-CM | POA: Diagnosis not present

## 2023-12-19 NOTE — Telephone Encounter (Signed)
   Pre-operative Risk Assessment    Patient Name: Mary Mcfarland  DOB: January 15, 1956 MRN: [id number]   Date of last office visit: 12/08/23 DR. LAMBERT Date of next office visit: 04/08/24 DAPHNE BARRACK, NP   Request for Surgical Clearance    Procedure:  COLONOSCOPY   Date of Surgery:  Clearance TBD (SEPTEMBER 2025)                               Surgeon:  DR. [facility] Surgeon's Group or Practice Name:  [facility] Phone number:  [phone] Fax number:  [phone]   Type of Clearance Requested:   - Medical  - Pharmacy:  Hold Apixaban  (Eliquis ) x 2 DAYS PRIOR   Type of Anesthesia:  PROPOFOL    Additional requests/questions:    Bonney Niels Jest   12/19/2023, 10:50 AM

## 2023-12-20 ENCOUNTER — Other Ambulatory Visit

## 2023-12-21 NOTE — Telephone Encounter (Signed)
 Patient with diagnosis of A Fib on Eliquis  for anticoagulation.  A Fib ablation on 08/15/23  Procedure: colonoscopy Date of procedure: TBD   CHA2DS2-VASc Score = 4  This indicates a 4.8% annual risk of stroke. The patient's score is based upon: CHF History: 0 HTN History: 1 Diabetes History: 0 Stroke History: 0 Vascular Disease History: 1 Age Score: 1 Gender Score: 1    CrCl 117 ml/min Platelet count 344K   Per office protocol, patient can hold Eliquis  for 2 days prior to procedure.    **This guidance is not considered finalized until pre-operative APP has relayed final recommendations.**

## 2023-12-22 NOTE — Telephone Encounter (Signed)
 Patient was recently seen by Dr. Cindie.  After his ablation in March, she has been taking Eliquis  for at least 3 months.    Dr. Cindie, can you comment on her cardiac clearance.

## 2023-12-25 NOTE — Telephone Encounter (Signed)
   Patient Name: Mary Mcfarland  DOB: 1956/05/19 MRN: [id number]  Primary Cardiologist: Shelda Bruckner, MD  Chart reviewed as part of pre-operative protocol coverage. Pre-op clearance already addressed by colleagues in earlier phone notes. To summarize recommendations:  - Per Dr. Cindie, OK to proceed with planned surgical procedure. No further testing indicated.  - Per pharmacist team, Per office protocol, patient can hold Eliquis  for 2 days prior to procedure.   Will route this bundled recommendation to requesting provider via Epic fax function and remove from pre-op pool. Please call with questions.  Raphael LOISE Bring, PA-C 12/25/2023, 10:17 AM

## 2023-12-29 NOTE — Progress Notes (Signed)
 Remote pacemaker transmission.

## 2023-12-29 NOTE — Addendum Note (Signed)
 Addended by: VICCI SELLER A on: 12/29/2023 04:01 PM   Modules accepted: Orders

## 2024-01-02 ENCOUNTER — Other Ambulatory Visit

## 2024-01-21 ENCOUNTER — Encounter: Payer: Self-pay | Admitting: Cardiology

## 2024-01-24 ENCOUNTER — Other Ambulatory Visit (HOSPITAL_BASED_OUTPATIENT_CLINIC_OR_DEPARTMENT_OTHER): Payer: Self-pay | Admitting: Cardiology

## 2024-01-24 DIAGNOSIS — R600 Localized edema: Secondary | ICD-10-CM

## 2024-01-24 DIAGNOSIS — R0609 Other forms of dyspnea: Secondary | ICD-10-CM

## 2024-01-30 DIAGNOSIS — Z0000 Encounter for general adult medical examination without abnormal findings: Secondary | ICD-10-CM | POA: Diagnosis not present

## 2024-01-30 DIAGNOSIS — I4891 Unspecified atrial fibrillation: Secondary | ICD-10-CM | POA: Diagnosis not present

## 2024-01-30 DIAGNOSIS — E559 Vitamin D deficiency, unspecified: Secondary | ICD-10-CM | POA: Diagnosis not present

## 2024-01-30 DIAGNOSIS — R7303 Prediabetes: Secondary | ICD-10-CM | POA: Diagnosis not present

## 2024-01-30 DIAGNOSIS — E782 Mixed hyperlipidemia: Secondary | ICD-10-CM | POA: Diagnosis not present

## 2024-02-06 DIAGNOSIS — Z6841 Body Mass Index (BMI) 40.0 and over, adult: Secondary | ICD-10-CM | POA: Diagnosis not present

## 2024-02-06 DIAGNOSIS — G629 Polyneuropathy, unspecified: Secondary | ICD-10-CM | POA: Diagnosis not present

## 2024-02-06 DIAGNOSIS — R21 Rash and other nonspecific skin eruption: Secondary | ICD-10-CM | POA: Diagnosis not present

## 2024-02-06 DIAGNOSIS — J4520 Mild intermittent asthma, uncomplicated: Secondary | ICD-10-CM | POA: Diagnosis not present

## 2024-02-06 DIAGNOSIS — G4700 Insomnia, unspecified: Secondary | ICD-10-CM | POA: Diagnosis not present

## 2024-02-06 DIAGNOSIS — E041 Nontoxic single thyroid nodule: Secondary | ICD-10-CM | POA: Diagnosis not present

## 2024-02-06 DIAGNOSIS — R7303 Prediabetes: Secondary | ICD-10-CM | POA: Diagnosis not present

## 2024-02-07 ENCOUNTER — Ambulatory Visit (INDEPENDENT_AMBULATORY_CARE_PROVIDER_SITE_OTHER): Payer: PPO

## 2024-02-07 DIAGNOSIS — I4819 Other persistent atrial fibrillation: Secondary | ICD-10-CM | POA: Diagnosis not present

## 2024-02-07 LAB — CUP PACEART REMOTE DEVICE CHECK
Battery Remaining Longevity: 57 mo
Battery Remaining Percentage: 59 %
Battery Voltage: 2.99 V
Brady Statistic AP VP Percent: 82 %
Brady Statistic AP VS Percent: 9.5 %
Brady Statistic AS VP Percent: 4.8 %
Brady Statistic AS VS Percent: 4.1 %
Brady Statistic RA Percent Paced: 90 %
Brady Statistic RV Percent Paced: 86 %
Date Time Interrogation Session: 20250827020018
Implantable Lead Connection Status: 753985
Implantable Lead Connection Status: 753985
Implantable Lead Implant Date: 20210826
Implantable Lead Implant Date: 20210826
Implantable Lead Location: 753859
Implantable Lead Location: 753860
Implantable Pulse Generator Implant Date: 20210826
Lead Channel Impedance Value: 530 Ohm
Lead Channel Impedance Value: 630 Ohm
Lead Channel Pacing Threshold Amplitude: 1 V
Lead Channel Pacing Threshold Amplitude: 1.25 V
Lead Channel Pacing Threshold Pulse Width: 0.4 ms
Lead Channel Pacing Threshold Pulse Width: 0.5 ms
Lead Channel Sensing Intrinsic Amplitude: 2.1 mV
Lead Channel Sensing Intrinsic Amplitude: 4 mV
Lead Channel Setting Pacing Amplitude: 2.5 V
Lead Channel Setting Pacing Amplitude: 2.5 V
Lead Channel Setting Pacing Pulse Width: 0.4 ms
Lead Channel Setting Sensing Sensitivity: 2 mV
Pulse Gen Model: 2272
Pulse Gen Serial Number: 3859348

## 2024-02-08 ENCOUNTER — Other Ambulatory Visit (HOSPITAL_COMMUNITY): Payer: Self-pay | Admitting: *Deleted

## 2024-02-08 MED ORDER — LAMOTRIGINE 100 MG PO TABS: 100.0000 mg | ORAL_TABLET | Freq: Every day | ORAL | 0 refills | Status: DC

## 2024-02-09 ENCOUNTER — Ambulatory Visit: Payer: Self-pay | Admitting: Cardiology

## 2024-02-15 DIAGNOSIS — L821 Other seborrheic keratosis: Secondary | ICD-10-CM | POA: Diagnosis not present

## 2024-02-15 DIAGNOSIS — L814 Other melanin hyperpigmentation: Secondary | ICD-10-CM | POA: Diagnosis not present

## 2024-02-15 DIAGNOSIS — D2272 Melanocytic nevi of left lower limb, including hip: Secondary | ICD-10-CM | POA: Diagnosis not present

## 2024-02-15 DIAGNOSIS — D225 Melanocytic nevi of trunk: Secondary | ICD-10-CM | POA: Diagnosis not present

## 2024-02-15 DIAGNOSIS — D2261 Melanocytic nevi of right upper limb, including shoulder: Secondary | ICD-10-CM | POA: Diagnosis not present

## 2024-02-15 DIAGNOSIS — D2262 Melanocytic nevi of left upper limb, including shoulder: Secondary | ICD-10-CM | POA: Diagnosis not present

## 2024-02-15 DIAGNOSIS — L718 Other rosacea: Secondary | ICD-10-CM | POA: Diagnosis not present

## 2024-02-15 DIAGNOSIS — D2271 Melanocytic nevi of right lower limb, including hip: Secondary | ICD-10-CM | POA: Diagnosis not present

## 2024-02-15 DIAGNOSIS — Z129 Encounter for screening for malignant neoplasm, site unspecified: Secondary | ICD-10-CM | POA: Diagnosis not present

## 2024-02-15 DIAGNOSIS — Z808 Family history of malignant neoplasm of other organs or systems: Secondary | ICD-10-CM | POA: Diagnosis not present

## 2024-02-19 ENCOUNTER — Other Ambulatory Visit (HOSPITAL_BASED_OUTPATIENT_CLINIC_OR_DEPARTMENT_OTHER): Payer: Self-pay | Admitting: Cardiology

## 2024-02-19 DIAGNOSIS — I700 Atherosclerosis of aorta: Secondary | ICD-10-CM

## 2024-03-04 DIAGNOSIS — E042 Nontoxic multinodular goiter: Secondary | ICD-10-CM | POA: Diagnosis not present

## 2024-03-04 DIAGNOSIS — E041 Nontoxic single thyroid nodule: Secondary | ICD-10-CM | POA: Diagnosis not present

## 2024-04-04 NOTE — Progress Notes (Deleted)
  Electrophysiology Office Note:   Date:  04/04/2024  ID:  TAUNJA BRICKNER, DOB 21-Jan-1956, MRN [id number]  Primary Cardiologist: Shelda Bruckner, MD Primary Heart Failure: None Electrophysiologist: OLE ONEIDA HOLTS, MD   {Click to update primary MD,subspecialty MD or APP then REFRESH:1}    History of Present Illness:   Mary Mcfarland is a 68 y.o. female with h/o *** seen today for routine electrophysiology followup.   Since last being seen in our clinic the patient reports doing ***.    She *** denies chest pain, palpitations, dyspnea, PND, orthopnea, nausea, vomiting, dizziness, syncope, edema, weight gain, or early satiety.   Review of systems complete and found to be negative unless listed in HPI.   EP Information / Studies Reviewed:    EKG is ordered today. Personal review as below.      PPM Interrogation-  reviewed in detail today,  See PACEART report.  Device History: Abbott Dual Chamber PPM implanted 02/06/20 for Sinus Node Dysfunction   Arrhythmia / AAD / Pertinent EP Studies AF  EPSH 08/15/23 > successful PVI, ablation of PW  Risk Assessment/Calculations:    CHA2DS2-VASc Score = 4  {Confirm score is correct.  If not, click here to update score.  REFRESH note.  :1} This indicates a 4.8% annual risk of stroke. The patient's score is based upon: CHF History: 0 HTN History: 1 Diabetes History: 0 Stroke History: 0 Vascular Disease History: 1 Age Score: 1 Gender Score: 1   {This patient has a significant risk of stroke if diagnosed with atrial fibrillation.  Please consider VKA or DOAC agent for anticoagulation if the bleeding risk is acceptable.   You can also use the SmartPhrase .[email or web address] for documentation.   :[id number]} No BP recorded.  {Refresh Note OR Click here to enter BP  :1}***        Physical Exam:   VS:  LMP 09/24/2013    Wt Readings from Last 3 Encounters:  12/08/23 250 lb (113.4 kg)  09/12/23 258 lb 12.8 oz (117.4 kg)   08/15/23 260 lb (117.9 kg)     GEN: Well nourished, well developed in no acute distress NECK: No JVD; No carotid bruits CARDIAC: {EPRHYTHM:28826}, no murmurs, rubs, gallops RESPIRATORY:  Clear to auscultation without rales, wheezing or rhonchi  ABDOMEN: Soft, non-tender, non-distended EXTREMITIES:  No edema; No deformity   ASSESSMENT AND PLAN:    SND s/p Abbott PPM  -Normal PPM function -See Pace Art report -No changes today  Persistent Atrial Fibrillation  High Risk Medication Monitoring: Tikosyn  CHA2DS2-VASc 4, s/p PVI + PW ablation 08/2023 -OAC for stroke prophylaxis  -EKG with ***, QTc ***ms  -no symptom burden post ablation ***  -***% AF burden on device  -continue Tikosyn  250 mcg BID  -update Tikosyn  labs > BMP, Mg+  Secondary Hypercoagulable State  -continue ***, dose reviewed and appropriate by ***   Disposition:   Follow up with EP APP {EPFOLLOW LE:71826}  Signed, Daphne Barrack, NP-C, AGACNP-BC [facility] - Electrophysiology  04/04/2024, 8:35 AM

## 2024-04-08 ENCOUNTER — Telehealth (HOSPITAL_COMMUNITY): Admitting: Psychiatry

## 2024-04-08 ENCOUNTER — Encounter (HOSPITAL_COMMUNITY): Payer: Self-pay | Admitting: Psychiatry

## 2024-04-08 ENCOUNTER — Ambulatory Visit: Admitting: Pulmonary Disease

## 2024-04-08 ENCOUNTER — Encounter: Admitting: Pulmonary Disease

## 2024-04-08 DIAGNOSIS — F3181 Bipolar II disorder: Secondary | ICD-10-CM

## 2024-04-08 DIAGNOSIS — F5102 Adjustment insomnia: Secondary | ICD-10-CM | POA: Diagnosis not present

## 2024-04-08 DIAGNOSIS — Z950 Presence of cardiac pacemaker: Secondary | ICD-10-CM

## 2024-04-08 DIAGNOSIS — Z79899 Other long term (current) drug therapy: Secondary | ICD-10-CM

## 2024-04-08 DIAGNOSIS — I495 Sick sinus syndrome: Secondary | ICD-10-CM

## 2024-04-08 DIAGNOSIS — I4819 Other persistent atrial fibrillation: Secondary | ICD-10-CM

## 2024-04-08 MED ORDER — LAMOTRIGINE 100 MG PO TABS: 100.0000 mg | ORAL_TABLET | Freq: Every day | ORAL | 0 refills | Status: AC

## 2024-04-08 NOTE — Progress Notes (Signed)
 [facility] Follow up visit   Patient Identification: Mary Mcfarland MRN:  [id number] Date of Evaluation:  04/08/2024 Referral Source: primary care Chief Complaint:  depression follow up , moodiness Visit Diagnosis:    ICD-10-CM   1. Bipolar 2 disorder (HCC)  F31.81     2. Adjustment insomnia  F51.02     Virtual Visit via Video Note  I connected with Mary Mcfarland on 04/08/24 at  9:00 AM EDT by a video enabled telemedicine application and verified that I am speaking with the correct person using two identifiers.  Location: Patient: home Provider: home office   I discussed the limitations of evaluation and management by telemedicine and the availability of in person appointments. The patient expressed understanding and agreed to proceed.      I discussed the assessment and treatment plan with the patient. The patient was provided an opportunity to ask questions and all were answered. The patient agreed with the plan and demonstrated an understanding of the instructions.   The patient was advised to call back or seek an in-person evaluation if the symptoms worsen or if the condition fails to improve as anticipated.  I provided 16 minutes of non-face-to-face time during this encounter.         History of Present Illness: Patient is a 68 years old currently single Caucasian female initially referred by primary care physician and cardiology for management and establishment of depression    Follows with providers for medical conditions, AFib   On evaluation patient is doing stable mood wise on Lamictal  there is no rash reported Sleeps fair on Ambien  gets it from primary care physician  No particular stress  Aggravating factors; health issues Modifying factors; grandkids Severity; fair and manageable   Patient denies drug use or alcohol use     Past Psychiatric History: Depression, possible bipolar  Previous Psychotropic Medications: Yes     Past  Medical History:  Past Medical History:  Diagnosis Date   Anticoagulant long-term use    eliquis --- managed by cardiology   Asthma, mild persistent    pulmonologist--- dr acquanetta staff;  (04-08-2022  pt stated last rescue inhaler use 2 months ago)   Bipolar 2 disorder (HCC)    Cardiac pacemaker in situ 02/06/2020   for tachy-brady syndrome and symptomatic bradycardia;   [employer]  (ep cardiologist--- dr kelsie)   Cystocele with rectocele    Difficult intubation 03/2016   04-08-2022  pt stated was scheduled for achilles tendon repair @ [facility] prior to when was done @ [facility] on 10-03-207,  when she woke up was told by nurse the surgery was note done due to Narrow airway and did not have what was needed so surgery was rescheduled (no documentation of any type in epic about this incident) per anesthesia record from 03-15-2016 pt had no issues and pt stated no issues   Diverticulosis of colon    DOE (dyspnea on exertion)    pt stated due to aftrial fib, sob,  is able to do household chores but with long distance walking and stairs get sob   Edema of both lower extremities    GERD (gastroesophageal reflux disease)    History of adenomatous polyp of colon    History of diverticulitis of colon    Hyperlipidemia    Hypertension    Longstanding persistent atrial fibrillation [facility]) 2017   cardiologist-- dr c. shelda;  for dx 2017 the reacurrance 01/ 2021;  s/p DCCV 07-24-2019;  admitted for  tikosyn  loading 03/ 2021;  sss s/p dual PPM 02-06-2020   MDD (major depressive disorder)    OA (osteoarthritis)    OSA (obstructive sleep apnea)    sleep study in epic 04-06-2021 OSA and nocturnal hypoxemia, per pt was unable to tolerate bipap recommended but does have appointment to get dental appliance   Pelvic prolapse    PONV (postoperative nausea and vomiting)    Pre-diabetes    Sciatica of right side    SSS (sick sinus syndrome) (HCC)    Thyroid  nodule    followed by pcp;  last ultrasound in epic  12-09-2011 pt stated never had bx done   Vitamin D deficiency    Wears glasses     Past Surgical History:  Procedure Laterality Date   ACHILLES TENDON SURGERY Right 08/02/2022   Procedure: RIGHT HEEL PUMP BUMP EXCISION AND ACHILLES TENDON REPAIR;  Surgeon: Sheril Coy, MD;  Location: [facility];  Service: Orthopedics;  Laterality: Right;   ANTERIOR AND POSTERIOR REPAIR N/A 04/14/2022   Procedure: POSTERIOR REPAIR (RECTOCELE);  Surgeon: Darcel Pool, MD;  Location: [facility];  Service: Gynecology;  Laterality: N/A;   ATRIAL FIBRILLATION ABLATION N/A 08/15/2023   Procedure: ATRIAL FIBRILLATION ABLATION;  Surgeon: Cindie Ole DASEN, MD;  Location: [facility] INVASIVE CV LAB;  Service: Cardiovascular;  Laterality: N/A;   CARDIOVERSION N/A 07/24/2019   Procedure: CARDIOVERSION;  Surgeon: Lonni Slain, MD;  Location: [facility] ENDOSCOPY;  Service: Cardiovascular;  Laterality: N/A;   COLONOSCOPY WITH PROPOFOL   11/2020   dr saintclair   HEEL SPUR RESECTION Left 03/15/2016   Procedure: ACHILLIES TENDON REPAIR AND PUMP BUMP EXCISION;  Surgeon: Coy Sheril, MD;  Location: [facility] OR;  Service: Orthopedics;  Laterality: Left;   PACEMAKER IMPLANT N/A 02/06/2020   Procedure: PACEMAKER IMPLANT;  Surgeon: Kelsie Agent, MD;  Location: [facility] INVASIVE CV LAB;  Service: Cardiovascular;  Laterality: N/A;   PERINEOPLASTY N/A 04/14/2022   Procedure: PERINEOPLASTY;  Surgeon: Darcel Pool, MD;  Location: [facility];  Service: Gynecology;  Laterality: N/A;   SHOULDER ARTHROSCOPY Right 11/15/2005   @[facility]  by dr sheril   TOE SURGERY Right 2011   bone spur   TONSILLECTOMY  1979    Family Psychiatric History: Mom diagnosed with possible bipolar  Family History:  Family History  Problem Relation Age of Onset   Heart disease Mother    CVA Mother    Hypertension Mother    Cancer Mother    Stroke Mother    Depression Mother    Bipolar disorder Mother    Obesity Mother    Heart disease  Father    Hypertension Father    Heart attack Father    Diabetes Father    Liver disease Father    Alcoholism Father    Drug abuse Father    Obesity Father     Social History:   Social History   Socioeconomic History   Marital status: Divorced    Spouse name: Not on file   Number of children: Not on file   Years of education: Not on file   Highest education level: Not on file  Occupational History   Occupation: retired  Tobacco Use   Smoking status: Former    Current packs/day: 0.00    Average packs/day: 1 pack/day for 22.0 years (22.0 ttl pk-yrs)    Types: Cigarettes    Start date: 02/12/1974    Quit date: 02/13/1996    Years since quitting: 28.1    Passive exposure: Past  Smokeless tobacco: Never   Tobacco comments:    Former smoker 05/04/23  Vaping Use   Vaping status: Never Used  Substance and Sexual Activity   Alcohol use: Not Currently    Comment: occassionally   Drug use: Never   Sexual activity: Not Currently  Other Topics Concern   Not on file  Social History Narrative   Lives in [place] alone   Work at [employer] ([place])   Social Drivers of Corporate Investment Banker Strain: Not on file  Food Insecurity: Not on file  Transportation Needs: Not on file  Physical Activity: Not on file  Stress: Not on file  Social Connections: Not on file     Allergies:   Allergies  Allergen Reactions   Niaspan [Niacin Er (Antihyperlipidemic)] Hives    Metabolic Disorder Labs: Lab Results  Component Value Date   HGBA1C 6.0 (H) 07/20/2022   MPG 125.5 07/20/2022   No results found for: PROLACTIN Lab Results  Component Value Date   CHOL 168 01/14/2020   TRIG 78 01/14/2020   HDL 61 01/14/2020   CHOLHDL 3.1 11/13/2019   LDLCALC 92 01/14/2020   LDLCALC 101 (H) 11/13/2019   Lab Results  Component Value Date   TSH 3.610 01/14/2020    Therapeutic Level Labs: No results found for: LITHIUM No results found for: CBMZ No results found  for: VALPROATE  Current Medications: See chart    Psychiatric Specialty Exam: Review of Systems  Cardiovascular:  Negative for chest pain.  Skin:  Negative for rash.  Psychiatric/Behavioral:  Negative for hallucinations.     Last menstrual period 09/24/2013.There is no height or weight on file to calculate BMI.  General Appearance: Casual  Eye Contact:  Fair  Speech:  Normal Rate  Volume:  Normal  Mood: fair  Affect:  Congruent  Thought Process:  Goal Directed  Orientation:  Full (Time, Place, and Person)  Thought Content:  Rumination  Suicidal Thoughts:  No  Homicidal Thoughts:  No  Memory:  Immediate;   Fair Recent;   Fair  Judgement:  Fair  Insight:  Shallow  Psychomotor Activity:  decreased  Concentration:  Concentration: Fair and Attention Span: Fair  Recall:  [employer] of Knowledge:Fair  Language: Good  Akathisia:  No  Handed:   AIMS (if indicated):  not done  Assets:  Desire for Improvement Housing Social Support  ADL's:  Intact  Cognition: WNL  Sleep:  variable   Screenings: Insurance Account Manager from 04/05/2022 in [facility] Office Visit from 01/14/2020 in [facility] at [facility] Total Score 2 6  PHQ-9 Total Score 8 14   Flowsheet Row UC from 09/22/2023 in [facility] Admission (Discharged) from 08/15/2023 in [facility] CARDIAC CATH LAB Admission (Discharged) from 08/11/2023 in [facility] CARDIAC CATH LAB  C-SSRS RISK CATEGORY No Risk No Risk No Risk    Assessment and Plan: as follows Prior documentation reviewed    Bipolar 2 ; depressed ; stable continue Lamictal   depression; improved and keeping active, has family support continue lamictal .   Insomnia: Manageable continue sleep hygiene and is also on Ambien  Reviewed meds, lamictal  sent  FU 6 m or earlier if needed Jackey Flight,  MD 10/27/20259:05 AM

## 2024-04-17 ENCOUNTER — Ambulatory Visit: Attending: Physician Assistant | Admitting: Cardiology

## 2024-04-17 VITALS — BP 116/54 | HR 70 | Ht 66.0 in | Wt 255.0 lb

## 2024-04-17 DIAGNOSIS — Z5181 Encounter for therapeutic drug level monitoring: Secondary | ICD-10-CM | POA: Diagnosis not present

## 2024-04-17 DIAGNOSIS — Z950 Presence of cardiac pacemaker: Secondary | ICD-10-CM | POA: Diagnosis not present

## 2024-04-17 DIAGNOSIS — I495 Sick sinus syndrome: Secondary | ICD-10-CM

## 2024-04-17 DIAGNOSIS — Z79899 Other long term (current) drug therapy: Secondary | ICD-10-CM | POA: Diagnosis not present

## 2024-04-17 DIAGNOSIS — I4819 Other persistent atrial fibrillation: Secondary | ICD-10-CM | POA: Diagnosis not present

## 2024-04-17 DIAGNOSIS — D6869 Other thrombophilia: Secondary | ICD-10-CM

## 2024-04-17 LAB — CUP PACEART INCLINIC DEVICE CHECK
Battery Remaining Longevity: 51 mo
Battery Voltage: 2.99 V
Brady Statistic RA Percent Paced: 91 %
Brady Statistic RV Percent Paced: 88 %
Date Time Interrogation Session: 20251105084754
Implantable Lead Connection Status: 753985
Implantable Lead Connection Status: 753985
Implantable Lead Implant Date: 20210826
Implantable Lead Implant Date: 20210826
Implantable Lead Location: 753859
Implantable Lead Location: 753860
Implantable Pulse Generator Implant Date: 20210826
Lead Channel Impedance Value: 512.5 Ohm
Lead Channel Impedance Value: 550 Ohm
Lead Channel Pacing Threshold Amplitude: 1 V
Lead Channel Pacing Threshold Amplitude: 1 V
Lead Channel Pacing Threshold Amplitude: 1.25 V
Lead Channel Pacing Threshold Amplitude: 1.25 V
Lead Channel Pacing Threshold Pulse Width: 0.4 ms
Lead Channel Pacing Threshold Pulse Width: 0.4 ms
Lead Channel Pacing Threshold Pulse Width: 0.5 ms
Lead Channel Pacing Threshold Pulse Width: 0.5 ms
Lead Channel Sensing Intrinsic Amplitude: 5 mV
Lead Channel Setting Pacing Amplitude: 2.5 V
Lead Channel Setting Pacing Amplitude: 2.5 V
Lead Channel Setting Pacing Pulse Width: 0.4 ms
Lead Channel Setting Sensing Sensitivity: 2 mV
Pulse Gen Model: 2272
Pulse Gen Serial Number: 3859348

## 2024-04-17 NOTE — Patient Instructions (Signed)
 Medication Instructions:   Your physician recommends that you continue on your current medications as directed. Please refer to the Current Medication list given to you today.   *If you need a refill on your cardiac medications before your next appointment, please call your pharmacy*   Lab Work:  PLEASE GO DOWN STAIRS  [facility]  FIRST FLOOR   ( GET OFF ELEVATORS WALK TOWARDS WAITING AREA LAB LOCATED BY PHARMACY):   BMET AND MAG TODAY      If you have labs (blood work) drawn today and your tests are completely normal, you will receive your results only by: MyChart Message (if you have MyChart) OR A paper copy in the mail If you have any lab test that is abnormal or we need to change your treatment, we will call you to review the results.    Testing/Procedures: NONE ORDERED  TODAY    Follow-Up: At [facility], you and your health needs are our priority.  As part of our continuing mission to provide you with exceptional heart care, our providers are all part of one team.  This team includes your primary Cardiologist (physician) and Advanced Practice Providers or APPs (Physician Assistants and Nurse Practitioners) who all work together to provide you with the care you need, when you need it.   Your next appointment: IN 4 MONTHS WITH AFIB  CLINIC  AND   1 year(s)  Provider:   Donnice Primus, MD    We recommend signing up for the patient portal called MyChart.  Sign up information is provided on this After Visit Summary.  MyChart is used to connect with patients for Virtual Visits (Telemedicine).  Patients are able to view lab/test results, encounter notes, upcoming appointments, etc.  Non-urgent messages can be sent to your provider as well.   To learn more about what you can do with MyChart, go to [email or web address].   Other Instructions

## 2024-04-17 NOTE — Progress Notes (Signed)
 Electrophysiology Office Note:   Date:  04/04/2024  ID:  Mary Mcfarland, DOB 1956-02-10, MRN [id number]  Primary Cardiologist: Shelda Bruckner, MD Primary Heart Failure: None Electrophysiologist: OLE ONEIDA HOLTS, MD      History of Present Illness:   Mary Mcfarland is a 68 y.o. female with h/o symptomatic bradycardia s/p PPM, persistent atrial fibrillation s/p ablation, hypertension, bipolar disorder seen today for routine electrophysiology followup.   Patient first found with afib in 2017, started Tikosyn  per Dr. Kelsie in 2021 along with beta blocker (beta blocker ultimately stopped due to bradycardia). She was seen in afib clinic for routine follow up in November 2024, found to be back in afib. DCCV and ultimately ablation arranged. S/p afib ablation with Dr Holts on 08/15/23 and remained in NSR at afib clinic follow up one month later. She also saw Dr. Holts 90 days post ablation and was still doing well with device interrogation showing no sustained arrhythmias.   Since last being seen in our clinic the patient reports doing well.    She denies chest pain, palpitations, dyspnea, PND, orthopnea, nausea, vomiting, dizziness, syncope, edema. Reports no symptoms of recurrent afib and states that she has had no alerts of arrhythmia from her Apple Watch.   Review of systems complete and found to be negative unless listed in HPI.   EP Information / Studies Reviewed:    EKG is ordered today. Personal review as below.  AV paced  PPM Interrogation-  reviewed in detail today,  See PACEART report.  Device History: Abbott Dual Chamber PPM implanted 02/06/20 for Sinus Node Dysfunction   Arrhythmia / AAD / Pertinent EP Studies AF  EPSH 08/15/23 > successful PVI, ablation of PW  Risk Assessment/Calculations:    CHA2DS2-VASc Score = 4  This indicates a 4.8% annual risk of stroke. The patient's score is based upon: CHF History: 0 HTN History: 1 Diabetes History:  0 Stroke History: 0 Vascular Disease History: 1 Age Score: 1 Gender Score: 1       Physical Exam:   VS:  LMP 09/24/2013    Wt Readings from Last 3 Encounters:  12/08/23 250 lb (113.4 kg)  09/12/23 258 lb 12.8 oz (117.4 kg)  08/15/23 260 lb (117.9 kg)    Vitals:   04/17/24 0805  BP: (!) 116/54  Pulse: 70  Height: 5' 6 (1.676 m)  Weight: 255 lb (115.7 kg)  SpO2: 95%  BMI (Calculated): 41.18     GEN: Well nourished, well developed in no acute distress NECK: No JVD; No carotid bruits CARDIAC: Regular rate and rhythm, no murmurs, rubs, gallops RESPIRATORY:  Clear to auscultation without rales, wheezing or rhonchi  ABDOMEN: Soft, non-tender, non-distended EXTREMITIES:  No edema; No deformity   ASSESSMENT AND PLAN:    SND s/p Abbott PPM  Normal PPM function. No R waves noted at VVI 45 but native conduction seen with loss of capture during ventricular threshold testing. Patient V paces 88%. No true atrial arrhythmia, false mode switches due to inappropriate far-field sensing. PMT episodes noted (some may be false), algorithm functioning appropriately and patient asymptomatic.  See Pace Art report No changes today  Persistent Atrial Fibrillation  High Risk Medication Monitoring: Tikosyn  Secondary hypercoagulable state CHA2DS2-VASc 4, s/p PVI + PW ablation 08/2023.  EKG with stable Qtc. Continues to have no symptom burden post ablation. 0% AF burden on device. Patient on Lamictal , which can affect QT, however is a chronic medication and ECG shows stable QTC. Continue Tikosyn  250 mcg  BID. Consider stopping 4yr post ablation. Will arranged afib clinic follow in March to discuss.  Continue Eliquis  5mg  BID Update Tikosyn  labs > BMP, Mg+    Disposition:   Follow up with afib clinic in March 2026. Dr. Almetta in 1 year.  Artist Pouch, PA-C  04/17/24 9:17 AM

## 2024-04-18 ENCOUNTER — Ambulatory Visit: Payer: Self-pay | Admitting: Cardiology

## 2024-04-18 LAB — BASIC METABOLIC PANEL WITH GFR
BUN/Creatinine Ratio: 21 (ref 12–28)
BUN: 19 mg/dL (ref 8–27)
CO2: 22 mmol/L (ref 20–29)
Calcium: 9.8 mg/dL (ref 8.7–10.3)
Chloride: 99 mmol/L (ref 96–106)
Creatinine, Ser: 0.89 mg/dL (ref 0.57–1.00)
Glucose: 114 mg/dL — AB (ref 70–99)
Potassium: 4.2 mmol/L (ref 3.5–5.2)
Sodium: 138 mmol/L (ref 134–144)
eGFR: 71 mL/min/1.73 (ref >59)

## 2024-04-18 LAB — MAGNESIUM: Magnesium: 1.9 mg/dL (ref 1.6–2.3)

## 2024-04-30 DIAGNOSIS — K5732 Diverticulitis of large intestine without perforation or abscess without bleeding: Secondary | ICD-10-CM | POA: Diagnosis not present

## 2024-04-30 DIAGNOSIS — K5730 Diverticulosis of large intestine without perforation or abscess without bleeding: Secondary | ICD-10-CM | POA: Diagnosis not present

## 2024-04-30 DIAGNOSIS — K5140 Inflammatory polyps of colon without complications: Secondary | ICD-10-CM | POA: Diagnosis not present

## 2024-04-30 DIAGNOSIS — K644 Residual hemorrhoidal skin tags: Secondary | ICD-10-CM | POA: Diagnosis not present

## 2024-04-30 DIAGNOSIS — Z09 Encounter for follow-up examination after completed treatment for conditions other than malignant neoplasm: Secondary | ICD-10-CM | POA: Diagnosis not present

## 2024-04-30 DIAGNOSIS — Z860101 Personal history of adenomatous and serrated colon polyps: Secondary | ICD-10-CM | POA: Diagnosis not present

## 2024-05-02 DIAGNOSIS — K5140 Inflammatory polyps of colon without complications: Secondary | ICD-10-CM | POA: Diagnosis not present

## 2024-05-08 ENCOUNTER — Ambulatory Visit: Payer: PPO

## 2024-05-08 DIAGNOSIS — I4819 Other persistent atrial fibrillation: Secondary | ICD-10-CM | POA: Diagnosis not present

## 2024-05-10 LAB — CUP PACEART REMOTE DEVICE CHECK
Battery Remaining Longevity: 50 mo
Battery Remaining Percentage: 56 %
Battery Voltage: 2.99 V
Brady Statistic AP VP Percent: 93 %
Brady Statistic AP VS Percent: 4.6 %
Brady Statistic AS VP Percent: 1.7 %
Brady Statistic AS VS Percent: 1 %
Brady Statistic RA Percent Paced: 96 %
Brady Statistic RV Percent Paced: 94 %
Date Time Interrogation Session: 20251126021117
Implantable Lead Connection Status: 753985
Implantable Lead Connection Status: 753985
Implantable Lead Implant Date: 20210826
Implantable Lead Implant Date: 20210826
Implantable Lead Location: 753859
Implantable Lead Location: 753860
Implantable Pulse Generator Implant Date: 20210826
Lead Channel Impedance Value: 450 Ohm
Lead Channel Impedance Value: 490 Ohm
Lead Channel Pacing Threshold Amplitude: 1 V
Lead Channel Pacing Threshold Amplitude: 1.25 V
Lead Channel Pacing Threshold Pulse Width: 0.4 ms
Lead Channel Pacing Threshold Pulse Width: 0.5 ms
Lead Channel Sensing Intrinsic Amplitude: 2.7 mV
Lead Channel Sensing Intrinsic Amplitude: 3.9 mV
Lead Channel Setting Pacing Amplitude: 2.5 V
Lead Channel Setting Pacing Amplitude: 2.5 V
Lead Channel Setting Pacing Pulse Width: 0.4 ms
Lead Channel Setting Sensing Sensitivity: 2 mV
Pulse Gen Model: 2272
Pulse Gen Serial Number: 3859348

## 2024-05-13 NOTE — Progress Notes (Signed)
 Remote PPM Transmission

## 2024-05-16 ENCOUNTER — Ambulatory Visit: Payer: Self-pay | Admitting: Cardiology

## 2024-06-26 ENCOUNTER — Encounter: Payer: Self-pay | Admitting: Surgical

## 2024-06-26 ENCOUNTER — Other Ambulatory Visit (HOSPITAL_COMMUNITY): Payer: Self-pay | Admitting: Surgical

## 2024-06-26 DIAGNOSIS — M25511 Pain in right shoulder: Secondary | ICD-10-CM

## 2024-07-04 ENCOUNTER — Other Ambulatory Visit: Payer: Self-pay | Admitting: Physician Assistant

## 2024-08-06 ENCOUNTER — Other Ambulatory Visit (HOSPITAL_COMMUNITY)

## 2024-08-07 ENCOUNTER — Ambulatory Visit: Payer: PPO

## 2024-08-15 ENCOUNTER — Ambulatory Visit (HOSPITAL_COMMUNITY): Admitting: Physician Assistant

## 2024-10-07 ENCOUNTER — Telehealth (HOSPITAL_COMMUNITY): Admitting: Psychiatry

## 2024-11-06 ENCOUNTER — Ambulatory Visit: Payer: PPO
